# Patient Record
Sex: Male | Born: 1985
Health system: Southern US, Community
[De-identification: ages and names within clinical notes are randomized; demographics above are authoritative.]

## PROBLEM LIST (undated history)

## (undated) DIAGNOSIS — I10 Essential (primary) hypertension: Secondary | ICD-10-CM

## (undated) DIAGNOSIS — D649 Anemia, unspecified: Secondary | ICD-10-CM

## (undated) DIAGNOSIS — J45909 Unspecified asthma, uncomplicated: Secondary | ICD-10-CM

## (undated) DIAGNOSIS — F32A Depression, unspecified: Secondary | ICD-10-CM

## (undated) DIAGNOSIS — F909 Attention-deficit hyperactivity disorder, unspecified type: Secondary | ICD-10-CM

## (undated) DIAGNOSIS — F419 Anxiety disorder, unspecified: Secondary | ICD-10-CM

## (undated) DIAGNOSIS — N19 Unspecified kidney failure: Secondary | ICD-10-CM

---

## 2017-01-18 ENCOUNTER — Other Ambulatory Visit: Payer: Self-pay

## 2017-01-18 ENCOUNTER — Ambulatory Visit (HOSPITAL_COMMUNITY)
Admission: EM | Admit: 2017-01-18 | Discharge: 2017-01-18 | Disposition: A | Discharge status: Home / Self Care | Payer: Self-pay | Attending: Urgent Care | Admitting: Urgent Care

## 2017-01-18 ENCOUNTER — Encounter (HOSPITAL_COMMUNITY): Payer: Self-pay | Admitting: Emergency Medicine

## 2017-01-18 DIAGNOSIS — B9789 Other viral agents as the cause of diseases classified elsewhere: Secondary | ICD-10-CM

## 2017-01-18 DIAGNOSIS — R112 Nausea with vomiting, unspecified: Secondary | ICD-10-CM

## 2017-01-18 DIAGNOSIS — R030 Elevated blood-pressure reading, without diagnosis of hypertension: Secondary | ICD-10-CM

## 2017-01-18 DIAGNOSIS — I10 Essential (primary) hypertension: Secondary | ICD-10-CM

## 2017-01-18 DIAGNOSIS — J069 Acute upper respiratory infection, unspecified: Secondary | ICD-10-CM

## 2017-01-18 HISTORY — DX: Unspecified asthma, uncomplicated: J45.909

## 2017-01-18 MED ORDER — BENZONATATE 100 MG PO CAPS: 100.0000 mg | ORAL_CAPSULE | Freq: Three times a day (TID) | ORAL | 0 refills | Status: DC | PRN

## 2017-01-18 MED ORDER — AMLODIPINE BESYLATE 5 MG PO TABS: 5.0000 mg | ORAL_TABLET | Freq: Every day | ORAL | 0 refills | Status: DC

## 2017-01-18 NOTE — Discharge Instructions (Signed)
[facility] [address] [place], [place] [address] [phone] Schedule appointment with Rosario Adie, PA-C, to establish care.   For sore throat try using a honey-based tea. Use 3 teaspoons of honey with juice squeezed from half lemon. Place shaved pieces of ginger into 1/2-1 cup of water and warm over stove top. Then mix the ingredients and repeat every 4 hours as needed.

## 2017-01-18 NOTE — ED Triage Notes (Signed)
Pt states "I didn't cook my breakfast all the way, it was a frozen meal, and afterward my stomach hurt and at work I threw up twice. I've also had a cold for a couple weeks.". C/o coughing.

## 2017-01-18 NOTE — ED Provider Notes (Signed)
  MRN: [id number] DOB: Feb 24, 1985  Subjective:   Samuel Burns is a 31 y.o. male presenting for 2 week history of productive cough, runny nose, subjective fever, mild headaches. Cough elicited wheezing (but has improved). Has tried generic NyQuil with some relief. Symptoms have improved except for the cough. He is also concerned about eating a frozen meal that was not fully cooked, vomited twice today. Has not tried medications for this today. Denies belly pain, ROS as above. Denies smoking cigarettes. Regarding BP, has been told he has this before but has not sought treatment. Denies dizziness, heart racing, palpitations, confusion, hematuria, lower leg swelling.  Samuel Burns is not currently taking any medications and has No Known Allergies.  Samuel Burns  has a past medical history of Asthma. Denies past surgical history. Reports family history of HTN with his mother's side.   Objective:   Vitals: BP (!) 189/107   Pulse 91   Temp 99.2 F (37.3 C) (Oral)   Resp 18   SpO2 100%   Physical Exam  Constitutional: He is oriented to person, place, and time. He appears well-developed and well-nourished.  HENT:  TM's intact bilaterally, no effusions or erythema. Nasal turbinates pink and moist, nasal passages patent. No sinus tenderness. Oropharynx clear, mucous membranes moist.  Eyes: Right eye exhibits no discharge. Left eye exhibits no discharge.  Neck: Normal range of motion. Neck supple.  Cardiovascular: Normal rate, regular rhythm and intact distal pulses. Exam reveals no gallop and no friction rub.  No murmur heard. Pulmonary/Chest: No respiratory distress. He has no wheezes. He has no rales.  Abdominal: Soft. Bowel sounds are normal. He exhibits no distension and no mass. There is no tenderness. There is no guarding.  Musculoskeletal: He exhibits no edema.  Lymphadenopathy:    He has no cervical adenopathy.  Neurological: He is alert and oriented to person, place, and time.  Skin: Skin is warm  and dry.   Assessment and Plan :   Viral URI with cough  Essential hypertension  Elevated blood pressure reading  Nausea and vomiting, intractability of vomiting not specified, unspecified vomiting type  Will manage supportively for viral type illness. Return-to-clinic precautions discussed, patient verbalized understanding. Recommended dietary modifications. I started patient on BP medications. Will have him follow up with me at PCP.  Jaynee Eagles, PA-C [facility]  01/18/2017  6:55 PM    Jaynee Eagles, PA-C 01/18/17 1929

## 2017-03-22 ENCOUNTER — Encounter (HOSPITAL_COMMUNITY): Payer: Self-pay | Admitting: Emergency Medicine

## 2017-03-22 DIAGNOSIS — J45909 Unspecified asthma, uncomplicated: Secondary | ICD-10-CM | POA: Diagnosis not present

## 2017-03-22 DIAGNOSIS — J029 Acute pharyngitis, unspecified: Secondary | ICD-10-CM | POA: Insufficient documentation

## 2017-03-22 DIAGNOSIS — B9789 Other viral agents as the cause of diseases classified elsewhere: Secondary | ICD-10-CM | POA: Insufficient documentation

## 2017-03-22 MED ORDER — IBUPROFEN 400 MG PO TABS
400.0000 mg | ORAL_TABLET | Freq: Once | ORAL | Status: AC | PRN
  Administered 2017-03-22: 400 mg via ORAL
  Filled 2017-03-22: qty 1

## 2017-03-22 NOTE — ED Triage Notes (Signed)
Pt to ED c/o sore throat x 2-3 days, states his son was sick with same, but was negative for strep. He reports having had strep about 10 years ago and this feels worse. Tonsils red and enlarged, voice clear, talking without difficulty. Denies fevers/chills. BP 227/145 in triage - states he hasn't had a chance to take his BP medication this evening.

## 2017-03-23 ENCOUNTER — Emergency Department (HOSPITAL_COMMUNITY)
Admission: EM | Admit: 2017-03-23 | Discharge: 2017-03-23 | Disposition: A | Discharge status: Home / Self Care | Payer: 59 | Attending: Emergency Medicine | Admitting: Emergency Medicine

## 2017-03-23 DIAGNOSIS — J029 Acute pharyngitis, unspecified: Secondary | ICD-10-CM

## 2017-03-23 LAB — RAPID STREP SCREEN (MED CTR MEBANE ONLY): STREPTOCOCCUS, GROUP A SCREEN (DIRECT): NEGATIVE

## 2017-03-23 MED ORDER — AMLODIPINE BESYLATE 5 MG PO TABS
5.0000 mg | ORAL_TABLET | Freq: Once | ORAL | Status: AC
  Administered 2017-03-23: 5 mg via ORAL
  Filled 2017-03-23: qty 1

## 2017-03-23 MED ORDER — IBUPROFEN 400 MG PO TABS: 400.0000 mg | ORAL_TABLET | Freq: Four times a day (QID) | ORAL | 0 refills | Status: DC | PRN

## 2017-03-23 MED ORDER — CETIRIZINE-PSEUDOEPHEDRINE ER 5-120 MG PO TB12: 1.0000 | ORAL_TABLET | Freq: Every day | ORAL | 0 refills | Status: DC

## 2017-03-23 MED ORDER — DEXAMETHASONE 4 MG PO TABS
10.0000 mg | ORAL_TABLET | Freq: Once | ORAL | Status: AC
  Administered 2017-03-23: 10 mg via ORAL
  Filled 2017-03-23: qty 3

## 2017-03-23 NOTE — ED Provider Notes (Signed)
[facility] EMERGENCY DEPARTMENT Provider Note   CSN: [id number] Arrival date & time: 03/22/17  2336     History   Chief Complaint Chief Complaint  Patient presents with  . Sore Throat    HPI Samuel Burns is a 32 y.o. male with history of asthma who presents with a 2-3-day history of sore throat.  Patient reports he is getting over a cold over the past week and developed a sore throat that is worse with swallowing for the past few days.  He denies fever.  He reports he is still coughing a little bit.  He has been exposed to his significant other and son who both had similar symptoms.  They are both strep negative.  He has taken Excedrin at home for the pain.  He still has some remaining nasal congestion.  His ears hurt when he swallows.  He denies any chest pain, shortness of breath, vomiting, abdominal pain.  HPI  Past Medical History:  Diagnosis Date  . Asthma     There are no active problems to display for this patient.   History reviewed. No pertinent surgical history.     Home Medications    Prior to Admission medications   Medication Sig Start Date End Date Taking? Authorizing Provider  amLODipine (NORVASC) 5 MG tablet Take 1 tablet (5 mg total) by mouth daily. 01/18/17   Jaynee Eagles, PA-C  benzonatate (TESSALON) 100 MG capsule Take 1-2 capsules (100-200 mg total) by mouth 3 (three) times daily as needed for cough. 01/18/17   Jaynee Eagles, PA-C  cetirizine-pseudoephedrine (ZYRTEC-D) 5-120 MG tablet Take 1 tablet by mouth daily. 03/23/17   Myreon Wimer, Bea Graff, PA-C  ibuprofen (ADVIL,MOTRIN) 400 MG tablet Take 1 tablet (400 mg total) by mouth every 6 (six) hours as needed. 03/23/17   Frederica Kuster, PA-C    Family History Family History  Problem Relation Age of Onset  . Hypertension Maternal Grandmother     Social History Social History   Tobacco Use  . Smoking status: Never Smoker  Substance Use Topics  . Alcohol use: No    Frequency: Never  .  Drug use: No     Allergies   Patient has no known allergies.   Review of Systems Review of Systems  Constitutional: Negative for chills and fever.  HENT: Positive for congestion, ear pain (with swallowing) and sore throat. Negative for facial swelling.   Respiratory: Positive for cough. Negative for shortness of breath.   Cardiovascular: Negative for chest pain.  Gastrointestinal: Negative for abdominal pain, nausea and vomiting.  Genitourinary: Negative for dysuria.  Musculoskeletal: Negative for back pain.  Skin: Negative for rash and wound.  Neurological: Negative for headaches.  Psychiatric/Behavioral: The patient is not nervous/anxious.      Physical Exam Updated Vital Signs BP (!) 212/130   Pulse 95   Temp 99.1 F (37.3 C) (Oral)   Resp 18   Ht 5' 7.5" (1.715 m)   Wt 98.4 kg (217 lb)   SpO2 95%   BMI 33.49 kg/m   Physical Exam  Constitutional: He appears well-developed and well-nourished. No distress.  Tolerating secretions  HENT:  Head: Normocephalic and atraumatic.  Right Ear: Tympanic membrane normal.  Mouth/Throat: No trismus in the jaw. Posterior oropharyngeal edema and posterior oropharyngeal erythema present. No oropharyngeal exudate or tonsillar abscesses. Tonsils are 2+ on the right. Tonsils are 2+ on the left. No tonsillar exudate.  Left ear canal occluded with cerumen  Eyes: Conjunctivae are  normal. Pupils are equal, round, and reactive to light. Right eye exhibits no discharge. Left eye exhibits no discharge. No scleral icterus.  Neck: Normal range of motion. Neck supple. No thyromegaly present.  Cardiovascular: Normal rate, regular rhythm, normal heart sounds and intact distal pulses. Exam reveals no gallop and no friction rub.  No murmur heard. Pulmonary/Chest: Effort normal and breath sounds normal. No stridor. No respiratory distress. He has no wheezes. He has no rales.  Abdominal: Soft. Bowel sounds are normal. He exhibits no distension. There  is no tenderness. There is no rebound and no guarding.  Musculoskeletal: He exhibits no edema.  Lymphadenopathy:    He has no cervical adenopathy.  Neurological: He is alert. Coordination normal.  Skin: Skin is warm and dry. No rash noted. He is not diaphoretic. No pallor.  Psychiatric: He has a normal mood and affect.  Nursing note and vitals reviewed.    ED Treatments / Results  Labs (all labs ordered are listed, but only abnormal results are displayed) Labs Reviewed  RAPID STREP SCREEN (NOT AT [facility])  CULTURE, GROUP A STREP [facility])    EKG  EKG Interpretation None       Radiology No results found.  Procedures Procedures (including critical care time)  Medications Ordered in ED Medications  ibuprofen (ADVIL,MOTRIN) tablet 400 mg (400 mg Oral Given 03/22/17 2356)  dexamethasone (DECADRON) tablet 10 mg (10 mg Oral Given 03/23/17 6578)  amLODipine (NORVASC) tablet 5 mg (5 mg Oral Given 03/23/17 4696)     Initial Impression / Assessment and Plan / ED Course  I have reviewed the triage vital signs and the nursing notes.  Pertinent labs & imaging results that were available during my care of the patient were reviewed by me and considered in my medical decision making (see chart for details).     Pt with negative strep. Diagnosis of viral pharyngitis. No abx indicated at this time. Discussed that results of strep culture are pending and patient will be informed if positive result and abx will be called in at that time. Discharge with symptomatic tx. No evidence of dehydration. Pt is tolerating secretions. Presentation not concerning for peritonsillar abscess or spread of infection to deep spaces of the throat; patent airway.  Will treat with single dose Decadron in the ED.  Patient also given his nightly amlodipine prior to discharge that he did not have time to take because he came here last night.  Patient discharged home with ibuprofen and Zyrtec-D.  Specific return precautions  discussed. Recommended PCP follow up.  Patient understands and agrees with plan.  Patient discharged in satisfactory condition.  Final Clinical Impressions(s) / ED Diagnoses   Final diagnoses:  Viral pharyngitis    ED Discharge Orders        Ordered    ibuprofen (ADVIL,MOTRIN) 400 MG tablet  Every 6 hours PRN     03/23/17 0629    cetirizine-pseudoephedrine (ZYRTEC-D) 5-120 MG tablet  Daily     03/23/17 0629       Frederica Kuster, PA-C 29/52/84 1324    Delora Fuel, MD 40/10/27 2250

## 2017-03-23 NOTE — Discharge Instructions (Signed)
Medications: ibuprofen, Zyrtec-D  Treatment: Take ibuprofen every 4-6 hours as needed for your pain. You can alternate with Tylenol or continue Exedrin as prescribed over-the-counter. Take Zyrtec-D once daily for nasal and ear congestion.  Follow-up: Please return to the emergency department if you develop any new or worsening symptoms including persistent fever over 100.4, inability to open your mouth, full speech, asymmetry in the back of your throat, or any other new or concerning symptoms.

## 2017-03-25 LAB — CULTURE, GROUP A STREP (THRC)

## 2017-04-08 ENCOUNTER — Encounter: Payer: Self-pay | Admitting: Urgent Care

## 2017-04-08 ENCOUNTER — Ambulatory Visit (INDEPENDENT_AMBULATORY_CARE_PROVIDER_SITE_OTHER): Payer: 59 | Admitting: Urgent Care

## 2017-04-08 VITALS — BP 185/139 | HR 95 | Temp 98.0°F | Resp 18 | Ht 67.5 in | Wt 239.4 lb

## 2017-04-08 DIAGNOSIS — I10 Essential (primary) hypertension: Secondary | ICD-10-CM

## 2017-04-08 DIAGNOSIS — J029 Acute pharyngitis, unspecified: Secondary | ICD-10-CM

## 2017-04-08 DIAGNOSIS — H9201 Otalgia, right ear: Secondary | ICD-10-CM

## 2017-04-08 DIAGNOSIS — R591 Generalized enlarged lymph nodes: Secondary | ICD-10-CM | POA: Diagnosis not present

## 2017-04-08 DIAGNOSIS — E669 Obesity, unspecified: Secondary | ICD-10-CM

## 2017-04-08 DIAGNOSIS — R030 Elevated blood-pressure reading, without diagnosis of hypertension: Secondary | ICD-10-CM | POA: Diagnosis not present

## 2017-04-08 DIAGNOSIS — R0989 Other specified symptoms and signs involving the circulatory and respiratory systems: Secondary | ICD-10-CM

## 2017-04-08 MED ORDER — AMLODIPINE BESYLATE 10 MG PO TABS: 10.0000 mg | ORAL_TABLET | Freq: Every day | ORAL | 1 refills | Status: DC

## 2017-04-08 MED ORDER — LOSARTAN POTASSIUM 100 MG PO TABS: 100.0000 mg | ORAL_TABLET | Freq: Every day | ORAL | 3 refills | Status: DC

## 2017-04-08 NOTE — Patient Instructions (Addendum)
Start taking 1/2 tablet of losartan (50mg ). In 1 week we will see what your blood pressure is like and plan to increase to a full tablet.   Prepare all your meals at home, do not use any salt. Pay attention to nutritional labels for the amount of sodium/salt. Do not eat any fast food or restaurant food in general. For seasoning of your foods, Mrs. Deliah Boston.    Salads - kale, spinach, cabbage, spring mix; use seeds like pumpkin seeds or sunflower seeds; you can also use 1-2 hard boiled eggs. Fruits - avocadoes, berries (blueberries, raspberries, blackberries), apples, oranges, pomegranate, grapefruit Seeds - quinoa, chia seeds; you can also incorporate oatmeal Vegetables - aspargus, cauliflower, broccoli, green beans, brussel spouts, bell peppers; stay away from starchy vegetables like potatoes, carrots, peas  Brussel sprouts - Cut off stems. Place in a mixing bowl that has a lid. Pour in a 1/4-1/2 cup olive oil, spices, use a light amount of parmesan. Place on a baking sheet. Bake for 10 minutes at 400F. Take it out, eat the brussel chips. Place for another 5-10 minutes.   Mashed cauliflower - Boil a bunch of cauliflower in a pot of water. Blend in a food processor with 1-2 tablespoons of butter.  Spaghetti squash -  Cut the squash in half very carefully, clean out seeds from the middle. Place 1/2 face down in a microwave safe dish with at least 2 inches of water. Make 4-6 slits on outside of spaghetti squash and microwave for 10-12 minutes. Take out the spaghetti using a metal spoon. Repeat for the other half.   Vega protein is good protein powder, make sure you use ~6 ice cubes to give it smoothie consistency together with ~4-6 ounces of vanilla soy milk. Throw cinnamon into your shake, use peanut butter. You can also use the fruits that I listed above. Throw spinach or kale into the shake.      Hypertension Hypertension, commonly called high blood pressure, is when the force of blood pumping  through the arteries is too strong. The arteries are the blood vessels that carry blood from the heart throughout the body. Hypertension forces the heart to work harder to pump blood and may cause arteries to become narrow or stiff. Having untreated or uncontrolled hypertension can cause heart attacks, strokes, kidney disease, and other problems. A blood pressure reading consists of a higher number over a lower number. Ideally, your blood pressure should be below 120/80. The first ("top") number is called the systolic pressure. It is a measure of the pressure in your arteries as your heart beats. The second ("bottom") number is called the diastolic pressure. It is a measure of the pressure in your arteries as the heart relaxes. What are the causes? The cause of this condition is not known. What increases the risk? Some risk factors for high blood pressure are under your control. Others are not. Factors you can change  Smoking.  Having type 2 diabetes mellitus, high cholesterol, or both.  Not getting enough exercise or physical activity.  Being overweight.  Having too much fat, sugar, calories, or salt (sodium) in your diet.  Drinking too much alcohol. Factors that are difficult or impossible to change  Having chronic kidney disease.  Having a family history of high blood pressure.  Age. Risk increases with age.  Race. You may be at higher risk if you are African-American.  Gender. Men are at higher risk than women before age 20. After age 72, women are  at higher risk than men.  Having obstructive sleep apnea.  Stress. What are the signs or symptoms? Extremely high blood pressure (hypertensive crisis) may cause:  Headache.  Anxiety.  Shortness of breath.  Nosebleed.  Nausea and vomiting.  Severe chest pain.  Jerky movements you cannot control (seizures).  How is this diagnosed? This condition is diagnosed by measuring your blood pressure while you are seated, with  your arm resting on a surface. The cuff of the blood pressure monitor will be placed directly against the skin of your upper arm at the level of your heart. It should be measured at least twice using the same arm. Certain conditions can cause a difference in blood pressure between your right and left arms. Certain factors can cause blood pressure readings to be lower or higher than normal (elevated) for a short period of time:  When your blood pressure is higher when you are in a health care provider's office than when you are at home, this is called white coat hypertension. Most people with this condition do not need medicines.  When your blood pressure is higher at home than when you are in a health care provider's office, this is called masked hypertension. Most people with this condition may need medicines to control blood pressure.  If you have a high blood pressure reading during one visit or you have normal blood pressure with other risk factors:  You may be asked to return on a different day to have your blood pressure checked again.  You may be asked to monitor your blood pressure at home for 1 week or longer.  If you are diagnosed with hypertension, you may have other blood or imaging tests to help your health care provider understand your overall risk for other conditions. How is this treated? This condition is treated by making healthy lifestyle changes, such as eating healthy foods, exercising more, and reducing your alcohol intake. Your health care provider may prescribe medicine if lifestyle changes are not enough to get your blood pressure under control, and if:  Your systolic blood pressure is above 130.  Your diastolic blood pressure is above 80.  Your personal target blood pressure may vary depending on your medical conditions, your age, and other factors. Follow these instructions at home: Eating and drinking  Eat a diet that is high in fiber and potassium, and low in  sodium, added sugar, and fat. An example eating plan is called the DASH (Dietary Approaches to Stop Hypertension) diet. To eat this way: ? Eat plenty of fresh fruits and vegetables. Try to fill half of your plate at each meal with fruits and vegetables. ? Eat whole grains, such as whole wheat pasta, brown rice, or whole grain bread. Fill about one quarter of your plate with whole grains. ? Eat or drink low-fat dairy products, such as skim milk or low-fat yogurt. ? Avoid fatty cuts of meat, processed or cured meats, and poultry with skin. Fill about one quarter of your plate with lean proteins, such as fish, chicken without skin, beans, eggs, and tofu. ? Avoid premade and processed foods. These tend to be higher in sodium, added sugar, and fat.  Reduce your daily sodium intake. Most people with hypertension should eat less than 1,500 mg of sodium a day.  Limit alcohol intake to no more than 1 drink a day for nonpregnant women and 2 drinks a day for men. One drink equals 12 oz of beer, 5 oz of wine, or 1  oz of hard liquor. Lifestyle  Work with your health care provider to maintain a healthy body weight or to lose weight. Ask what an ideal weight is for you.  Get at least 30 minutes of exercise that causes your heart to beat faster (aerobic exercise) most days of the week. Activities may include walking, swimming, or biking.  Include exercise to strengthen your muscles (resistance exercise), such as pilates or lifting weights, as part of your weekly exercise routine. Try to do these types of exercises for 30 minutes at least 3 days a week.  Do not use any products that contain nicotine or tobacco, such as cigarettes and e-cigarettes. If you need help quitting, ask your health care provider.  Monitor your blood pressure at home as told by your health care provider.  Keep all follow-up visits as told by your health care provider. This is important. Medicines  Take over-the-counter and  prescription medicines only as told by your health care provider. Follow directions carefully. Blood pressure medicines must be taken as prescribed.  Do not skip doses of blood pressure medicine. Doing this puts you at risk for problems and can make the medicine less effective.  Ask your health care provider about side effects or reactions to medicines that you should watch for. Contact a health care provider if:  You think you are having a reaction to a medicine you are taking.  You have headaches that keep coming back (recurring).  You feel dizzy.  You have swelling in your ankles.  You have trouble with your vision. Get help right away if:  You develop a severe headache or confusion.  You have unusual weakness or numbness.  You feel faint.  You have severe pain in your chest or abdomen.  You vomit repeatedly.  You have trouble breathing. Summary  Hypertension is when the force of blood pumping through your arteries is too strong. If this condition is not controlled, it may put you at risk for serious complications.  Your personal target blood pressure may vary depending on your medical conditions, your age, and other factors. For most people, a normal blood pressure is less than 120/80.  Hypertension is treated with lifestyle changes, medicines, or a combination of both. Lifestyle changes include weight loss, eating a healthy, low-sodium diet, exercising more, and limiting alcohol. This information is not intended to replace advice given to you by your health care provider. Make sure you discuss any questions you have with your health care provider. Document Released: 02/02/2005 Document Revised: 01/01/2016 Document Reviewed: 01/01/2016 Elsevier Interactive Patient Education  2018 [place], Adult An earache, or ear pain, can be caused by many things, including:  An infection.  Ear wax buildup.  Ear pressure.  Something in the ear that should  not be there (foreign body).  A sore throat.  Tooth problems.  Jaw problems.  Treatment of the earache will depend on the cause. If the cause is not clear or cannot be determined, you may need to watch your symptoms until your earache goes away or until a cause is found. Follow these instructions at home: Pay attention to any changes in your symptoms. Take these actions to help with your pain:  Take or apply over-the-counter and prescription medicines only as told by your health care provider.  If you were prescribed an antibiotic medicine, use it as told by your health care provider. Do not stop using the antibiotic even if you start to feel  better.  Do not put anything in your ear other than medicine that is prescribed by your health care provider.  If directed, apply heat to the affected area as often as told by your health care provider. Use the heat source that your health care provider recommends, such as a moist heat pack or a heating pad. ? Place a towel between your skin and the heat source. ? Leave the heat on for 20-30 minutes. ? Remove the heat if your skin turns bright red. This is especially important if you are unable to feel pain, heat, or cold. You may have a greater risk of getting burned.  If directed, put ice on the ear: ? Put ice in a plastic bag. ? Place a towel between your skin and the bag. ? Leave the ice on for 20 minutes, 2-3 times a day.  Try resting in an upright position instead of lying down. This may help to reduce pressure in your ear and relieve pain.  Chew gum if it helps to relieve your ear pain.  Treat any allergies as told by your health care provider.  Keep all follow-up visits as told by your health care provider. This is important.  Contact a health care provider if:  Your pain does not improve within 2 days.  Your earache gets worse.  You have new symptoms.  You have a fever. Get help right away if:  You have a severe  headache.  You have a stiff neck.  You have trouble swallowing.  You have redness or swelling behind your ear.  You have fluid or blood coming from your ear.  You have hearing loss.  You feel dizzy. This information is not intended to replace advice given to you by your health care provider. Make sure you discuss any questions you have with your health care provider. Document Released: 09/20/2003 Document Revised: 10/01/2015 Document Reviewed: 07/29/2015 Elsevier Interactive Patient Education  2018 [employer].     IF you received an x-ray today, you will receive an invoice from [facility]. Please contact [facility] at [phone] with questions or concerns regarding your invoice.   IF you received labwork today, you will receive an invoice from [facility]. Please contact [facility] at [phone] with questions or concerns regarding your invoice.   Our billing staff will not be able to assist you with questions regarding bills from these companies.  You will be contacted with the lab results as soon as they are available. The fastest way to get your results is to activate your My Chart account. Instructions are located on the last page of this paperwork. If you have not heard from [place] regarding the results in 2 weeks, please contact this office.

## 2017-04-08 NOTE — Progress Notes (Signed)
  MRN: [id number] DOB: 17-Dec-1985  Subjective:   Samuel Burns is a 32 y.o. male presenting for 2 week history of right ear pain. Symptoms started out with sore throat, sinus congestion. Patient was seen at [facility] ER on 03/23/2017, treated with Decadron, given scripts for ibuprofen, Zyrtec-D. States that he stills has symptoms. Regarding BP, he is taking amlodipine. Diet is non-compliant. Denies dizziness, chronic headache, blurred vision, chest pain, shortness of breath, heart racing, palpitations, nausea, vomiting, abdominal pain, hematuria, lower leg swelling. Denies smoking cigarettes.  Samuel Burns has a current medication list which includes the following prescription(s): amlodipine and cetirizine-pseudoephedrine. Also has No Known Allergies.  Samuel Burns  has a past medical history of Asthma. Denies past surgical history. His grandmother, father have high blood pressure.   Objective:   Vitals: BP (!) 185/139   Pulse 95   Temp 98 F (36.7 C) (Oral)   Resp 18   Ht 5' 7.5" (1.715 m)   Wt 239 lb 6.4 oz (108.6 kg)   SpO2 99%   BMI 36.94 kg/m   BP Readings from Last 3 Encounters:  04/08/17 (!) 185/139  03/23/17 (!) 212/130  01/18/17 (!) 189/107    Physical Exam  Constitutional: He is oriented to person, place, and time. He appears well-developed and well-nourished.  HENT:  TM's intact bilaterally, no effusions or erythema. Nasal turbinates dry, nasal passages patent. No sinus tenderness. Oropharynx clear, mucous membranes moist.    Eyes: EOM are normal. Pupils are equal, round, and reactive to light. Right eye exhibits no discharge.  Neck: Normal range of motion. Neck supple. No thyromegaly present.  Cardiovascular: Normal rate, regular rhythm and intact distal pulses. Exam reveals no gallop and no friction rub.  No murmur heard. Pulmonary/Chest: No respiratory distress. He has no wheezes. He has no rales.  Musculoskeletal: He exhibits no edema.  Lymphadenopathy:    He has no cervical  adenopathy.  Neurological: He is alert and oriented to person, place, and time. No cranial nerve deficit.  Skin: Skin is warm and dry.  Psychiatric: He has a normal mood and affect.   Assessment and Plan :   Right ear pain  Sore throat  Tenderness of lymph node  Essential hypertension - Plan: Comprehensive metabolic panel, Microalbumin / creatinine urine ratio  Elevated blood pressure reading - Plan: Comprehensive metabolic panel, Microalbumin / creatinine urine ratio  Obesity without serious comorbidity, unspecified classification, unspecified obesity type - Plan: Lipid panel  Patient's physical exam findings reassuring. BP is worrisome, kidney function unknown. Will start aggressive BP management, will follow up with labs and decide further treatment for his current symptoms. Return-to-clinic precautions discussed, patient verbalized understanding.   Jaynee Eagles, PA-C [facility] [phone] 04/08/2017  12:38 PM

## 2017-04-09 LAB — LIPID PANEL
Chol/HDL Ratio: 4 ratio (ref 0.0–5.0)
Cholesterol, Total: 157 mg/dL (ref 100–199)
HDL: 39 mg/dL — AB (ref >39)
LDL CALC: 95 mg/dL (ref 0–99)
Triglycerides: 114 mg/dL (ref 0–149)
VLDL CHOLESTEROL CAL: 23 mg/dL (ref 5–40)

## 2017-04-09 LAB — COMPREHENSIVE METABOLIC PANEL
A/G RATIO: 1.6 (ref 1.2–2.2)
ALBUMIN: 5.1 g/dL (ref 3.5–5.5)
ALT: 35 IU/L (ref 0–44)
AST: 25 IU/L (ref 0–40)
Alkaline Phosphatase: 122 IU/L — ABNORMAL HIGH (ref 39–117)
BILIRUBIN TOTAL: 1 mg/dL (ref 0.0–1.2)
BUN / CREAT RATIO: 15 (ref 9–20)
BUN: 17 mg/dL (ref 6–20)
CALCIUM: 10 mg/dL (ref 8.7–10.2)
CHLORIDE: 103 mmol/L (ref 96–106)
CO2: 24 mmol/L (ref 20–29)
Creatinine, Ser: 1.1 mg/dL (ref 0.76–1.27)
GFR, EST AFRICAN AMERICAN: 102 mL/min/{1.73_m2} (ref >59)
GFR, EST NON AFRICAN AMERICAN: 88 mL/min/{1.73_m2} (ref >59)
GLOBULIN, TOTAL: 3.2 g/dL (ref 1.5–4.5)
Glucose: 84 mg/dL (ref 65–99)
POTASSIUM: 4.2 mmol/L (ref 3.5–5.2)
Sodium: 142 mmol/L (ref 134–144)
TOTAL PROTEIN: 8.3 g/dL (ref 6.0–8.5)

## 2017-04-09 LAB — MICROALBUMIN / CREATININE URINE RATIO
Creatinine, Urine: 231 mg/dL
MICROALB/CREAT RATIO: 52.5 mg/g{creat} — AB (ref 0.0–30.0)
Microalbumin, Urine: 121.3 ug/mL

## 2017-04-15 ENCOUNTER — Other Ambulatory Visit: Payer: Self-pay | Admitting: Urgent Care

## 2017-04-15 MED ORDER — ATORVASTATIN CALCIUM 10 MG PO TABS: 10.0000 mg | ORAL_TABLET | Freq: Every day | ORAL | 3 refills | Status: DC

## 2017-04-16 ENCOUNTER — Ambulatory Visit: Payer: 59 | Admitting: Urgent Care

## 2019-11-19 ENCOUNTER — Encounter (HOSPITAL_COMMUNITY): Payer: Self-pay | Admitting: Emergency Medicine

## 2019-11-19 ENCOUNTER — Emergency Department (HOSPITAL_COMMUNITY): Payer: Self-pay

## 2019-11-19 ENCOUNTER — Inpatient Hospital Stay (HOSPITAL_COMMUNITY): Payer: Self-pay

## 2019-11-19 ENCOUNTER — Other Ambulatory Visit: Payer: Self-pay

## 2019-11-19 ENCOUNTER — Inpatient Hospital Stay (HOSPITAL_COMMUNITY)
Admission: EM | Admit: 2019-11-19 | Discharge: 2019-11-27 | DRG: 252 | Disposition: A | Discharge status: Home / Self Care | Payer: Self-pay | Attending: Family Medicine | Admitting: Family Medicine

## 2019-11-19 DIAGNOSIS — D649 Anemia, unspecified: Secondary | ICD-10-CM

## 2019-11-19 DIAGNOSIS — K810 Acute cholecystitis: Secondary | ICD-10-CM | POA: Diagnosis present

## 2019-11-19 DIAGNOSIS — R7989 Other specified abnormal findings of blood chemistry: Secondary | ICD-10-CM | POA: Diagnosis present

## 2019-11-19 DIAGNOSIS — Z20822 Contact with and (suspected) exposure to covid-19: Secondary | ICD-10-CM | POA: Diagnosis present

## 2019-11-19 DIAGNOSIS — D594 Other nonautoimmune hemolytic anemias: Secondary | ICD-10-CM | POA: Diagnosis present

## 2019-11-19 DIAGNOSIS — D696 Thrombocytopenia, unspecified: Secondary | ICD-10-CM

## 2019-11-19 DIAGNOSIS — N186 End stage renal disease: Secondary | ICD-10-CM | POA: Diagnosis present

## 2019-11-19 DIAGNOSIS — I2489 Other forms of acute ischemic heart disease: Secondary | ICD-10-CM | POA: Diagnosis present

## 2019-11-19 DIAGNOSIS — N179 Acute kidney failure, unspecified: Secondary | ICD-10-CM | POA: Diagnosis present

## 2019-11-19 DIAGNOSIS — Z23 Encounter for immunization: Secondary | ICD-10-CM

## 2019-11-19 DIAGNOSIS — M311 Thrombotic microangiopathy, unspecified: Secondary | ICD-10-CM

## 2019-11-19 DIAGNOSIS — Z79899 Other long term (current) drug therapy: Secondary | ICD-10-CM

## 2019-11-19 DIAGNOSIS — Z992 Dependence on renal dialysis: Secondary | ICD-10-CM

## 2019-11-19 DIAGNOSIS — I471 Supraventricular tachycardia: Secondary | ICD-10-CM | POA: Diagnosis not present

## 2019-11-19 DIAGNOSIS — Z8249 Family history of ischemic heart disease and other diseases of the circulatory system: Secondary | ICD-10-CM

## 2019-11-19 DIAGNOSIS — I5032 Chronic diastolic (congestive) heart failure: Secondary | ICD-10-CM | POA: Diagnosis present

## 2019-11-19 DIAGNOSIS — I21A1 Myocardial infarction type 2: Secondary | ICD-10-CM | POA: Diagnosis not present

## 2019-11-19 DIAGNOSIS — I161 Hypertensive emergency: Principal | ICD-10-CM | POA: Diagnosis present

## 2019-11-19 DIAGNOSIS — K8020 Calculus of gallbladder without cholecystitis without obstruction: Secondary | ICD-10-CM

## 2019-11-19 DIAGNOSIS — I132 Hypertensive heart and chronic kidney disease with heart failure and with stage 5 chronic kidney disease, or end stage renal disease: Secondary | ICD-10-CM | POA: Diagnosis present

## 2019-11-19 DIAGNOSIS — I252 Old myocardial infarction: Secondary | ICD-10-CM

## 2019-11-19 DIAGNOSIS — E872 Acidosis, unspecified: Secondary | ICD-10-CM | POA: Diagnosis present

## 2019-11-19 DIAGNOSIS — R778 Other specified abnormalities of plasma proteins: Secondary | ICD-10-CM | POA: Diagnosis not present

## 2019-11-19 DIAGNOSIS — F1290 Cannabis use, unspecified, uncomplicated: Secondary | ICD-10-CM | POA: Diagnosis present

## 2019-11-19 DIAGNOSIS — R197 Diarrhea, unspecified: Secondary | ICD-10-CM | POA: Diagnosis present

## 2019-11-19 DIAGNOSIS — I248 Other forms of acute ischemic heart disease: Secondary | ICD-10-CM | POA: Diagnosis present

## 2019-11-19 DIAGNOSIS — R112 Nausea with vomiting, unspecified: Secondary | ICD-10-CM | POA: Diagnosis present

## 2019-11-19 DIAGNOSIS — D6959 Other secondary thrombocytopenia: Secondary | ICD-10-CM | POA: Diagnosis present

## 2019-11-19 DIAGNOSIS — Z9114 Patient's other noncompliance with medication regimen: Secondary | ICD-10-CM

## 2019-11-19 DIAGNOSIS — J45909 Unspecified asthma, uncomplicated: Secondary | ICD-10-CM | POA: Diagnosis present

## 2019-11-19 DIAGNOSIS — I313 Pericardial effusion (noninflammatory): Secondary | ICD-10-CM | POA: Diagnosis present

## 2019-11-19 DIAGNOSIS — M546 Pain in thoracic spine: Secondary | ICD-10-CM | POA: Diagnosis present

## 2019-11-19 DIAGNOSIS — N2581 Secondary hyperparathyroidism of renal origin: Secondary | ICD-10-CM | POA: Diagnosis present

## 2019-11-19 DIAGNOSIS — F4320 Adjustment disorder, unspecified: Secondary | ICD-10-CM | POA: Diagnosis present

## 2019-11-19 DIAGNOSIS — I4892 Unspecified atrial flutter: Secondary | ICD-10-CM | POA: Clinically undetermined

## 2019-11-19 DIAGNOSIS — D638 Anemia in other chronic diseases classified elsewhere: Secondary | ICD-10-CM

## 2019-11-19 DIAGNOSIS — D631 Anemia in chronic kidney disease: Secondary | ICD-10-CM | POA: Diagnosis present

## 2019-11-19 LAB — COMPREHENSIVE METABOLIC PANEL
ALT: 21 U/L (ref 0–44)
AST: 13 U/L — ABNORMAL LOW (ref 15–41)
Albumin: 4.5 g/dL (ref 3.5–5.0)
Alkaline Phosphatase: 232 U/L — ABNORMAL HIGH (ref 38–126)
Anion gap: 31 — ABNORMAL HIGH (ref 5–15)
BUN: 257 mg/dL — ABNORMAL HIGH (ref 6–20)
CO2: 15 mmol/L — ABNORMAL LOW (ref 22–32)
Calcium: 9.5 mg/dL (ref 8.9–10.3)
Chloride: 89 mmol/L — ABNORMAL LOW (ref 98–111)
Creatinine, Ser: 34.21 mg/dL — ABNORMAL HIGH (ref 0.61–1.24)
GFR calc Af Amer: 2 mL/min — ABNORMAL LOW (ref >60)
GFR calc non Af Amer: 1 mL/min — ABNORMAL LOW (ref >60)
Glucose, Bld: 131 mg/dL — ABNORMAL HIGH (ref 70–99)
Potassium: 3.7 mmol/L (ref 3.5–5.1)
Sodium: 135 mmol/L (ref 135–145)
Total Bilirubin: 2.3 mg/dL — ABNORMAL HIGH (ref 0.3–1.2)
Total Protein: 7.9 g/dL (ref 6.5–8.1)

## 2019-11-19 LAB — CBC
HCT: 21.8 % — ABNORMAL LOW (ref 39.0–52.0)
Hemoglobin: 7.3 g/dL — ABNORMAL LOW (ref 13.0–17.0)
MCH: 29.9 pg (ref 26.0–34.0)
MCHC: 33.5 g/dL (ref 30.0–36.0)
MCV: 89.3 fL (ref 80.0–100.0)
Platelets: 58 10*3/uL — ABNORMAL LOW (ref 150–400)
RBC: 2.44 MIL/uL — ABNORMAL LOW (ref 4.22–5.81)
RDW: 18.8 % — ABNORMAL HIGH (ref 11.5–15.5)
WBC: 13.7 10*3/uL — ABNORMAL HIGH (ref 4.0–10.5)
nRBC: 0.2 % (ref 0.0–0.2)

## 2019-11-19 LAB — LIPASE, BLOOD: Lipase: 169 U/L — ABNORMAL HIGH (ref 11–51)

## 2019-11-19 LAB — TROPONIN I (HIGH SENSITIVITY)
Troponin I (High Sensitivity): 592 ng/L (ref <18)
Troponin I (High Sensitivity): 579 ng/L (ref <18)
Troponin I (High Sensitivity): 591 ng/L (ref <18)

## 2019-11-19 LAB — BRAIN NATRIURETIC PEPTIDE: B Natriuretic Peptide: 1267 pg/mL — ABNORMAL HIGH (ref 0.0–100.0)

## 2019-11-19 LAB — HIV ANTIBODY (ROUTINE TESTING W REFLEX): HIV Screen 4th Generation wRfx: NONREACTIVE

## 2019-11-19 LAB — RESPIRATORY PANEL BY RT PCR (FLU A&B, COVID)
Influenza A by PCR: NEGATIVE
Influenza B by PCR: NEGATIVE
SARS Coronavirus 2 by RT PCR: NEGATIVE

## 2019-11-19 MED ORDER — FENTANYL CITRATE (PF) 100 MCG/2ML IJ SOLN
50.0000 ug | INTRAMUSCULAR | Status: DC | PRN
  Administered 2019-11-19: 50 ug via INTRAVENOUS
  Filled 2019-11-19: qty 2

## 2019-11-19 MED ORDER — AMLODIPINE BESYLATE 5 MG PO TABS: 5.0000 mg | ORAL_TABLET | Freq: Every day | ORAL | Status: DC

## 2019-11-19 MED ORDER — PIPERACILLIN-TAZOBACTAM IN DEX 2-0.25 GM/50ML IV SOLN
2.2500 g | Freq: Three times a day (TID) | INTRAVENOUS | Status: DC
  Filled 2019-11-19: qty 50

## 2019-11-19 MED ORDER — PIPERACILLIN-TAZOBACTAM IN DEX 2-0.25 GM/50ML IV SOLN
2.2500 g | Freq: Three times a day (TID) | INTRAVENOUS | Status: DC
  Administered 2019-11-20 – 2019-11-21 (×4): 2.25 g via INTRAVENOUS
  Filled 2019-11-19 (×5): qty 50

## 2019-11-19 MED ORDER — PIPERACILLIN-TAZOBACTAM 3.375 G IVPB 30 MIN: 3.3750 g | Freq: Once | INTRAVENOUS | Status: DC

## 2019-11-19 MED ORDER — ONDANSETRON HCL 4 MG/2ML IJ SOLN
4.0000 mg | Freq: Once | INTRAMUSCULAR | Status: AC
  Administered 2019-11-19: 13:00:00 4 mg via INTRAVENOUS
  Filled 2019-11-19: qty 2

## 2019-11-19 MED ORDER — POLYETHYLENE GLYCOL 3350 17 G PO PACK: 17.0000 g | PACK | Freq: Every day | ORAL | Status: DC | PRN

## 2019-11-19 MED ORDER — SODIUM CHLORIDE 0.9 % IV BOLUS
1000.0000 mL | Freq: Once | INTRAVENOUS | Status: AC
Start: 2019-11-19 — End: 2019-11-19
  Administered 2019-11-19: 13:00:00 1000 mL via INTRAVENOUS

## 2019-11-19 MED ORDER — HEPARIN SODIUM (PORCINE) 5000 UNIT/ML IJ SOLN
5000.0000 [IU] | Freq: Three times a day (TID) | INTRAMUSCULAR | Status: DC
  Administered 2019-11-19 – 2019-11-20 (×3): 5000 [IU] via SUBCUTANEOUS
  Filled 2019-11-19 (×3): qty 1

## 2019-11-19 MED ORDER — CARVEDILOL 3.125 MG PO TABS: 6.2500 mg | ORAL_TABLET | Freq: Two times a day (BID) | ORAL | Status: DC

## 2019-11-19 MED ORDER — AMLODIPINE BESYLATE 10 MG PO TABS
10.0000 mg | ORAL_TABLET | Freq: Every day | ORAL | Status: DC
  Administered 2019-11-19 – 2019-11-27 (×9): 10 mg via ORAL
  Filled 2019-11-19: qty 2
  Filled 2019-11-19: qty 1
  Filled 2019-11-19: qty 2
  Filled 2019-11-19 (×6): qty 1

## 2019-11-19 MED ORDER — CARVEDILOL 3.125 MG PO TABS
3.1250 mg | ORAL_TABLET | Freq: Two times a day (BID) | ORAL | Status: DC
  Administered 2019-11-19 – 2019-11-20 (×2): 3.125 mg via ORAL
  Filled 2019-11-19 (×2): qty 1

## 2019-11-19 MED ORDER — FENTANYL CITRATE (PF) 100 MCG/2ML IJ SOLN
50.0000 ug | Freq: Once | INTRAMUSCULAR | Status: AC
  Administered 2019-11-19: 50 ug via INTRAVENOUS
  Filled 2019-11-19: qty 2

## 2019-11-19 MED ORDER — AMLODIPINE BESYLATE 5 MG PO TABS: 10.0000 mg | ORAL_TABLET | Freq: Every day | ORAL | Status: DC

## 2019-11-19 MED ORDER — LABETALOL HCL 5 MG/ML IV SOLN: 10.0000 mg | INTRAVENOUS | Status: DC | PRN

## 2019-11-19 MED ORDER — ACETAMINOPHEN 325 MG PO TABS
650.0000 mg | ORAL_TABLET | Freq: Four times a day (QID) | ORAL | Status: DC | PRN
  Administered 2019-11-22: 650 mg via ORAL
  Filled 2019-11-19: qty 2

## 2019-11-19 MED ORDER — MELATONIN 5 MG PO TABS
5.0000 mg | ORAL_TABLET | Freq: Every day | ORAL | Status: DC
  Administered 2019-11-19 – 2019-11-26 (×8): 5 mg via ORAL
  Filled 2019-11-19 (×9): qty 1

## 2019-11-19 MED ORDER — PIPERACILLIN-TAZOBACTAM 3.375 G IVPB 30 MIN
3.3750 g | Freq: Once | INTRAVENOUS | Status: AC
  Administered 2019-11-20: 3.375 g via INTRAVENOUS
  Filled 2019-11-19: qty 50

## 2019-11-19 MED ORDER — NICARDIPINE HCL IN NACL 20-0.86 MG/200ML-% IV SOLN
3.0000 mg/h | INTRAVENOUS | Status: DC
  Administered 2019-11-19 (×2): 5 mg/h via INTRAVENOUS
  Filled 2019-11-19 (×2): qty 200

## 2019-11-19 MED ORDER — ACETAMINOPHEN 650 MG RE SUPP: 650.0000 mg | Freq: Four times a day (QID) | RECTAL | Status: DC | PRN

## 2019-11-19 NOTE — Progress Notes (Addendum)
FPTS Interim Progress Note  S:Interviewed patient at bedside.  He reports pain in his Right abdomen that while still painful is better controlled with pain medication. Is tearful because his father recently passed while on dialysis and now being told he will need to start dialysis. Discussed that tomorrow will start dialysis. Reports being unable to sleep except for a few hours at a time.  O: BP (!) 150/88   Pulse (!) 101   Temp 97.9 F (36.6 C) (Oral)   Resp (!) 23   SpO2 97%   Physical Exam Vitals and nursing note reviewed.  Constitutional:      General: He is not in acute distress.    Appearance: Normal appearance. He is normal weight. He is not ill-appearing, toxic-appearing or diaphoretic.  HENT:     Head: Normocephalic and atraumatic.  Cardiovascular:     Rate and Rhythm: Regular rhythm. Tachycardia present.     Pulses: Normal pulses.          Radial pulses are 2+ on the right side and 2+ on the left side.       Dorsalis pedis pulses are 2+ on the right side and 2+ on the left side.     Heart sounds: Normal heart sounds, S1 normal and S2 normal. No murmur heard.   Pulmonary:     Effort: Pulmonary effort is normal. No respiratory distress.     Breath sounds: Normal breath sounds. No wheezing.  Abdominal:     General: There is no distension.     Tenderness: There is abdominal tenderness in the right upper quadrant and right lower quadrant. There is no guarding.  Musculoskeletal:     Right lower leg: 2+ Pitting Edema present.     Left lower leg: 2+ Pitting Edema present.  Neurological:     Mental Status: He is alert. Mental status is at baseline.  Psychiatric:        Mood and Affect: Mood is anxious. Affect is tearful.        Behavior: Behavior normal.        Thought Content: Thought content normal.      A/P: Will continue with current plan of beginning Dialysis tomorrow.  Pain: Will start pain regiment to better control abdominal pain. -Continue fentanyl 50 mcg  q2 q6  Poor Sleep: Due to pain and anxiety of current situation -Continue fentanyl 50 mcg q2 PRN for pain control -Melatonin 5 mg QHS   Briant Cedar, MD 11/19/2019, 9:58 PM PGY-1, [place] pager [phone]     Addendum: After getting results of [place] suspect acute cholecystitis. Have consulted General Surgery who will see him this evening. Recommended to start Empiric Antibiotics. Consulted Pharmacy and will start Zosyn to be renally adjusted. Spoke with ED nurse will obtain blood cultures before antibiotics start and will stop the Nicardapine drip to attempt to wean.

## 2019-11-19 NOTE — ED Notes (Signed)
Pa Humes notified of goal met with last bp

## 2019-11-19 NOTE — ED Notes (Addendum)
Claiborne Billings - PA present during BP and aware of results.

## 2019-11-19 NOTE — TOC Initial Note (Signed)
Transition of Care [facility]) - Initial/Assessment Note    Patient Details  Name: Samuel Burns MRN: [id number] Date of Birth: Nov 04, 1985  Transition of [facility]) CM/SW Contact:    Verdell Carmine, RN Phone Number: 11/19/2019, 4:31 PM  Clinical Narrative:                 Consult in for patient CM patient does not have a PCP. Referral in patient instructions for [facility] to make appointment on Monday. Unable to make appointment for patient because it is the weekend.   Expected Discharge Plan: Home/Self Care Barriers to Discharge: Continued Medical Work up   Patient Goals and CMS Choice        Expected Discharge Plan and Services Expected Discharge Plan: Home/Self Care   Discharge Planning Services: CM Consult   Living arrangements for the past 2 months: Apartment                                      Prior Living Arrangements/Services Living arrangements for the past 2 months: Apartment   Patient language and need for interpreter reviewed:: Yes        Need for Family Participation in Patient Care: Yes (Comment) Care giver support system in place?: Yes (comment)   Criminal Activity/Legal Involvement Pertinent to Current Situation/Hospitalization: No - Comment as needed  Activities of Daily Living      Permission Sought/Granted                  Emotional Assessment       Orientation: : Oriented to Self, Oriented to Place, Oriented to  Time, Oriented to Situation Alcohol / Substance Use: Not Applicable Psych Involvement: No (comment)  Admission diagnosis:  Acute renal failure (ARF) ([place]) [N17.9] Patient Active Problem List   Diagnosis Date Noted  . Acute renal failure (ARF) ([place]) 11/19/2019   PCP:  Patient, No Pcp Per Pharmacy:   [facility] [address] [place] [address] Phone: [phone] Fax:  [phone]     Social Determinants of Health (SDOH) Interventions    Readmission Risk Interventions No flowsheet data found.

## 2019-11-19 NOTE — Progress Notes (Signed)
Pharmacy Antibiotic Note  Samuel Burns is a 34 y.o. male admitted on 11/19/2019 with acalculus cholecystitis.  Pharmacy has been consulted for Zosyn dosing.  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  Plan: Zosyn 3.375g x1 then 2.25g IV Q8H.   Temp (24hrs), Avg:97.9 F (36.6 C), Min:97.8 F (36.6 C), Max:97.9 F (36.6 C)  Recent Labs  Lab 11/19/19 0906  WBC 13.7*  CREATININE 34.21*     No Known Allergies   Thank you for allowing pharmacy to be a part of this patient's care.  Wynona Neat, PharmD, BCPS  11/19/2019 11:57 PM

## 2019-11-19 NOTE — Consult Note (Signed)
Reason for Consult: Elevated creatinine, metabolic acidosis Referring Physician: Carmin Muskrat M.D. (EDP)  HPI:  34 year old African-American man with past medical history significant for bronchial asthma and hypertension.  No documented history of chronic kidney disease and 2 years ago had a normal renal function with a creatinine of 1.1.  He reports to have not taken any of his antihypertensive medications reliably for the last 5 years or so and for the past few weeks has been having decreasing appetite, energy levels, generalized weakness and dysgeusia.  He denies any dysuria, urgency, frequency, flank pain, fever or chills.  He reports that his father had end-stage renal disease and was on hemodialysis prior to his demise.  He complains of some intermittent mid to lower back pain for the last few weeks without any notable numbness or weakness of the lower extremities but has had some unintentional tremors particularly with his hands.  He denies any regular NSAID use.  Labs in the emergency room today were significant for a creatinine of 34 and a bicarbonate of 15 with a hemoglobin of 7.3.  Troponin is elevated as is BNP.  Albumin 4.5.  Past Medical History:  Diagnosis Date  . Asthma     History reviewed. No pertinent surgical history.  Family History  Problem Relation Age of Onset  . Hypertension Maternal Grandmother     Social History:  reports that he has never smoked. He has never used smokeless tobacco. He reports current drug use. Drug: Marijuana. He reports that he does not drink alcohol.  Allergies: No Known Allergies  Medications:  Scheduled: . fentaNYL (SUBLIMAZE) injection  50 mcg Intravenous Once    BMP Latest Ref Rng & Units 11/19/2019 04/08/2017  Glucose 70 - 99 mg/dL 131(H) 84  BUN 6 - 20 mg/dL 257(H) 17  Creatinine 0.61 - 1.24 mg/dL 34.21(H) 1.10  BUN/Creat Ratio 9 - 20 - 15  Sodium 135 - 145 mmol/L 135 142  Potassium 3.5 - 5.1 mmol/L 3.7 4.2  Chloride 98 -  111 mmol/L 89(L) 103  CO2 22 - 32 mmol/L 15(L) 24  Calcium 8.9 - 10.3 mg/dL 9.5 10.0   CBC Latest Ref Rng & Units 11/19/2019  WBC 4.0 - 10.5 K/uL 13.7(H)  Hemoglobin 13.0 - 17.0 g/dL 7.3(L)  Hematocrit 39 - 52 % 21.8(L)  Platelets 150 - 400 K/uL 58(L)     CT ABDOMEN PELVIS WO CONTRAST  Result Date: 11/19/2019 CLINICAL DATA:  Right-sided thoracic back pain, nausea, vomiting, diarrhea, aortic dissection suspected EXAM: CT CHEST, ABDOMEN AND PELVIS WITHOUT CONTRAST TECHNIQUE: Multidetector CT imaging of the chest, abdomen and pelvis was performed following the standard protocol without IV contrast. COMPARISON:  None. FINDINGS: CT CHEST FINDINGS Cardiovascular: No significant vascular findings. Cardiomegaly. No pericardial effusion. Mediastinum/Nodes: No enlarged mediastinal, hilar, or axillary lymph nodes. Thyroid gland, trachea, and esophagus demonstrate no significant findings. Lungs/Pleura: Dependent bibasilar heterogeneous atelectasis or consolidation. No pleural effusion or pneumothorax. Musculoskeletal: No chest wall mass or suspicious bone lesions identified. CT ABDOMEN PELVIS FINDINGS Hepatobiliary: No solid liver abnormality is seen. The gallbladder is distended with wall thickening (series 3, image 77). No radiopaque calculi identified. No biliary ductal dilatation Pancreas: Unremarkable. No pancreatic ductal dilatation or surrounding inflammatory changes. Spleen: Normal in size without significant abnormality. Adrenals/Urinary Tract: Adrenal glands are unremarkable. Kidneys are normal, without renal calculi, solid lesion, or hydronephrosis. Bladder is unremarkable. Stomach/Bowel: Stomach is within normal limits. Appendix appears normal. No evidence of bowel wall thickening, distention, or inflammatory changes. Vascular/Lymphatic: No significant vascular  findings are present. No enlarged abdominal or pelvic lymph nodes. Reproductive: No mass or other abnormality. Other: No abdominal wall hernia  or abnormality. No abdominopelvic ascites. Musculoskeletal: No acute or significant osseous findings. IMPRESSION: 1. The gallbladder is distended with wall thickening. No radiopaque calculi identified. No biliary ductal dilatation. Findings are concerning for acute cholecystitis. 2. Predominantly dependent bibasilar heterogeneous atelectasis or consolidation. Findings are concerning for infection or aspiration. 3. No obvious evidence of aortic aneurysm or dissection, however please note that noncontrast CT is very limited for the evaluation of aortic dissection and other acute aortic pathology. 4. Cardiomegaly. Electronically Signed   By: Eddie Candle M.D.   On: 11/19/2019 14:38   DG Chest 2 View  Result Date: 11/19/2019 CLINICAL DATA:  Right back pain EXAM: CHEST - 2 VIEW COMPARISON:  None. FINDINGS: Lungs are clear.  No pleural effusion or pneumothorax. Cardiomegaly. Visualized osseous structures are within normal limits. IMPRESSION: No evidence of acute cardiopulmonary disease. Electronically Signed   By: Julian Hy M.D.   On: 11/19/2019 13:19   CT CHEST WO CONTRAST  Result Date: 11/19/2019 CLINICAL DATA:  Right-sided thoracic back pain, nausea, vomiting, diarrhea, aortic dissection suspected EXAM: CT CHEST, ABDOMEN AND PELVIS WITHOUT CONTRAST TECHNIQUE: Multidetector CT imaging of the chest, abdomen and pelvis was performed following the standard protocol without IV contrast. COMPARISON:  None. FINDINGS: CT CHEST FINDINGS Cardiovascular: No significant vascular findings. Cardiomegaly. No pericardial effusion. Mediastinum/Nodes: No enlarged mediastinal, hilar, or axillary lymph nodes. Thyroid gland, trachea, and esophagus demonstrate no significant findings. Lungs/Pleura: Dependent bibasilar heterogeneous atelectasis or consolidation. No pleural effusion or pneumothorax. Musculoskeletal: No chest wall mass or suspicious bone lesions identified. CT ABDOMEN PELVIS FINDINGS Hepatobiliary: No solid  liver abnormality is seen. The gallbladder is distended with wall thickening (series 3, image 77). No radiopaque calculi identified. No biliary ductal dilatation Pancreas: Unremarkable. No pancreatic ductal dilatation or surrounding inflammatory changes. Spleen: Normal in size without significant abnormality. Adrenals/Urinary Tract: Adrenal glands are unremarkable. Kidneys are normal, without renal calculi, solid lesion, or hydronephrosis. Bladder is unremarkable. Stomach/Bowel: Stomach is within normal limits. Appendix appears normal. No evidence of bowel wall thickening, distention, or inflammatory changes. Vascular/Lymphatic: No significant vascular findings are present. No enlarged abdominal or pelvic lymph nodes. Reproductive: No mass or other abnormality. Other: No abdominal wall hernia or abnormality. No abdominopelvic ascites. Musculoskeletal: No acute or significant osseous findings. IMPRESSION: 1. The gallbladder is distended with wall thickening. No radiopaque calculi identified. No biliary ductal dilatation. Findings are concerning for acute cholecystitis. 2. Predominantly dependent bibasilar heterogeneous atelectasis or consolidation. Findings are concerning for infection or aspiration. 3. No obvious evidence of aortic aneurysm or dissection, however please note that noncontrast CT is very limited for the evaluation of aortic dissection and other acute aortic pathology. 4. Cardiomegaly. Electronically Signed   By: Eddie Candle M.D.   On: 11/19/2019 14:38   US Aorta  Result Date: 11/19/2019 CLINICAL DATA:  34 year old male with hypertensive emergency and back pain for 3 days. EXAM: ULTRASOUND OF ABDOMINAL AORTA TECHNIQUE: Ultrasound examination of the abdominal aorta and proximal common iliac arteries was performed to evaluate for aneurysm. Additional color and Doppler images of the distal aorta were obtained to document patency. COMPARISON:  None. FINDINGS: Abdominal aortic measurements as follows:  Proximal:  2.4 x 2.6 cm (AP by transverse) Mid:         2.0 x 1.9 cm Distal:      1.8 x 2.0 cm Patent: Yes, peak systolic velocity is 324  cm/s Aortoiliac bifurcation and proximal iliac arteries are obscured by overlying bowel gas. IMPRESSION: Negative for abdominal aortic aneurysm. Electronically Signed   By: Genevie Ann M.D.   On: 11/19/2019 13:29    Review of Systems  Constitutional: Positive for activity change, appetite change and fatigue. Negative for chills and fever.  HENT: Negative for nosebleeds, sinus pressure, sinus pain and sore throat.   Eyes: Negative for photophobia and redness.  Respiratory: Negative for cough, shortness of breath and wheezing.   Gastrointestinal: Positive for nausea and vomiting. Negative for abdominal pain and diarrhea.  Genitourinary: Positive for difficulty urinating. Negative for dysuria, frequency, hematuria and urgency.  Musculoskeletal: Positive for back pain. Negative for joint swelling, myalgias and neck stiffness.  Neurological: Positive for tremors, light-headedness and headaches. Negative for numbness.  Psychiatric/Behavioral: The patient is nervous/anxious.    Blood pressure (!) 167/90, pulse 99, temperature 97.9 F (36.6 C), temperature source Oral, resp. rate (!) 25, SpO2 100 %. Physical Exam Vitals and nursing note reviewed.  Constitutional:      Appearance: Normal appearance. He is normal weight. He is ill-appearing.  HENT:     Head: Normocephalic and atraumatic.     Right Ear: External ear normal.     Left Ear: External ear normal.     Nose: Nose normal.     Mouth/Throat:     Mouth: Mucous membranes are dry.     Pharynx: Oropharynx is clear. No oropharyngeal exudate.  Eyes:     General: No scleral icterus.    Conjunctiva/sclera: Conjunctivae normal.     Pupils: Pupils are equal, round, and reactive to light.  Cardiovascular:     Rate and Rhythm: Normal rate and regular rhythm.     Heart sounds: Murmur heard.      Comments: 3/6  holosystolic murmur Pulmonary:     Effort: Pulmonary effort is normal.     Breath sounds: Normal breath sounds. No wheezing.  Abdominal:     General: Abdomen is flat. There is no distension.     Palpations: Abdomen is soft. There is no mass.     Tenderness: There is no abdominal tenderness. There is no guarding.  Musculoskeletal:     Cervical back: Normal range of motion and neck supple. No rigidity.     Right lower leg: Edema present.     Left lower leg: Edema present.     Comments: Trace LE edema  Skin:    General: Skin is warm and dry.     Findings: No bruising.  Neurological:     General: No focal deficit present.     Mental Status: He is alert and oriented to person, place, and time.  Psychiatric:        Mood and Affect: Mood normal.     Assessment/Plan: 1.  Acute kidney injury versus end-stage renal disease: The available data points likely to end-stage renal disease.  We will schedule him for placement of a tunneled hemodialysis catheter tomorrow by interventional radiology and at some point during this hospitalization, obtain vascular surgery consultation for permanent access along with outpatient dialysis unit placement.  We had a lengthy discussion regarding his current findings and symptoms that likely reflect ESRD over acute kidney injury. 2.  Hypertensive urgency: He is currently on nicardipine drip and I recommend resuming oral antihypertensive therapy at this time as he is able to eat and drink without any problems. 3.  Anemia: Likely anemia of chronic kidney disease, check iron saturations and decide on ESA  with subsequent labs. 4.  Secondary hyperparathyroidism: Screen for secondary hyperparathyroidism with a phosphorus level as well as PTH.  His calcium level is within acceptable range. 5.  Elevated troponin levels: Unclear if this is entirely due to his advanced chronic kidney disease or whether this represents an acute cardiac event.  Additional evaluation and  management per cardiology.  Ceaser Ebeling K. 11/19/2019, 3:30 PM

## 2019-11-19 NOTE — ED Notes (Signed)
Patient transported to X-ray 

## 2019-11-19 NOTE — ED Notes (Signed)
Pt is aware that we need a urine sample 

## 2019-11-19 NOTE — ED Notes (Signed)
Back from CT

## 2019-11-19 NOTE — ED Notes (Signed)
Per verbal Dr. Vanita Panda tritrated to 7.5

## 2019-11-19 NOTE — ED Notes (Signed)
Patient transported to US 

## 2019-11-19 NOTE — ED Notes (Addendum)
Off floor to xray.

## 2019-11-19 NOTE — ED Notes (Signed)
Spoke with provider, page if systolic goes above 088. Will wean pt off of cardene and attempt to control BP with oral meds. Awaiting change in oral medications prior to admin of ordered day shift meds.

## 2019-11-19 NOTE — ED Notes (Signed)
Pt resp 25-30 but profusion is good. Pt requested oxygen for comfort put on 2L

## 2019-11-19 NOTE — H&P (Addendum)
[facility] Admission History and Physical Service Pager: [phone]  Patient name: Samuel Burns Medical record number: [id number] Date of birth: [birth year] Age: 34 y.o. Gender: male  Primary Care Provider: Patient, No Pcp Per Consultants: Nephrology Code Status: FULL  Chief Complaint: Nausea  Assessment and Plan: Jaggar Benko is a 34 y.o. male presenting with nausea and vomiting found to have hypertensive crisis with acute renal failure. PMH is significant for HTN.  Acute renal failure Pt presents w/ 2 weeks decreased urine output, significant elevated creatinine of 34.2, BUN of 257, and anion gap metabolic acidosis. Hx of uncontrolled HTN and presenting today in hypertensive emergendcy, renal failure most  likely secondary to uncontrolled hypertension.  Nephrology consulted, appreciate involvement.  He will need dialysis here, possibly ESRD requiring long-term dialysis given severity of illness.  Per nephrology, plan for placement of tunneled HD catheter tomorrow by IR and vascular surgery consult for permanent access and outpatient dialysis placement at some point during hospitalization. - admit to FPTS, progressive, Dr. Ardelia Mems attending - plan for placement of tunneled HD catheter tomorrow by IR to initiate HD - AM renal function panel, HbA1c - up with assistance - renal diet, NPO after midnight for procedure - vitals per routine - PT/OT - strict I/O - daily weights  Hypertensive emergency Significantly hypertensive to 248/137 on admission this morning with evidence of kidney damage as noted above.  Patient has a history of hypertension and has apparently not been taking his medications for years.  Patient was started on a nicardipine drip in the ED with improvement in his blood pressure with goal to maintain BP around 818 systolic.  Concern for aortic dissection given 2-day history of right thoracic back pain.  Unable to obtain CTA due to renal failure  but  CXR without mediastinal widening and CT chest without evidence of aortic dissection. Current BP 148/82 with nicardipine rate 5 mg/h. - wean off nicardipine drip, goal < 299 systolic - amlodipine 10 mg daily - carvedilol 3.125 mg BID - if unable to wean off gtt, will need to go to ICU for mgmt of hypertensive emergency.  Elevated troponin and BNP Elevated troponin 592 on admission, no significant change on repeat at 579. BNP > 1500.  Although this is elevated in part due to his renal failure, Likely has some degree of CHF given his findings of cardiomegaly on imaging and loud systolic murmur on exam.  EKG with some flipped T waves (nonspecific finding) and no ST elevations. Discussed with cardiologist on call.  Most likely secondary to renal failure and hypertensive emergency.  Do not suspect acute cardiac event. - EKG in AM - f/u echo  Thoracic back pain 2 day history of severe back pain.  No back pain history prior.  Dissection and aneurysm effectively ruled out, altough we could not use contrast with CT chest/abd. Gallstones with thickened gallbladder wall seen on ct suggestive of acute cholecystitis.  Pt w/o RUQ pain, fever, or leukocytosis.  Will get ruq [place] for further eval.   - f/u ruq [place]. - fentanyl 60mcg q2h for severe pain - tylenol for moderate pain   Normocytic anemia Hemoglobin 7.3 on presentation.  Could represent anemia of CKD or iron deficiency anemia. Will obtain labs to assess for iron status, vitamin B12, reticulocytes, and hemolysis. - AM labs: Fe, TIBC, ferritin, LDH, haptoglobin, retics, B12  Covid vaccine Patient is not vaccinated against Covid.  He is interested in getting the Covid vaccine here. - will arrange  for covid vaccine during admission if possible  FEN/GI: renal diet, NPO after midnight  Prophylaxis: [facility]  Disposition: progressive  History of Present Illness:  Samuel Burns is a 34 y.o. male presenting with 2 weeks of nausea and 2 days of back  pain.  Patient states for the past 2 weeks, he has felt nausea, fatigue, decreased appetite, and altered sense of taste and smell.  He began having back pain on his lower right side started 2 days ago which came on all of a sudden while he was laying down.  He also had an episode of vomiting yesterday and an episode of diarrhea this morning which was nonbloody.  He has been urinating less, but still urinating 1-2 times a day.  No fevers/chills.  He has a history of hypertension diagnosed over 5 years ago.  He states he was previously on a medication that started with L, but does not remember what it was.  Currently, he has not taken his medications for years.  Not vaccinated against COVID, but is interested in getting the Covid vaccine.   No surgical history.   Dad recently died from kidney failure.  On dialysis for 2 years.  Dad was 82 yo.     Review Of Systems: Per HPI with the following additions:   Review of Systems  Constitutional: Positive for malaise/fatigue and weight loss. Negative for chills, diaphoresis and fever.  HENT: Negative for sore throat.   Eyes: Negative for blurred vision and double vision.  Respiratory: Positive for shortness of breath. Negative for cough.   Cardiovascular: Negative for chest pain, palpitations, orthopnea and leg swelling.  Gastrointestinal: Positive for abdominal pain, diarrhea, nausea and vomiting. Negative for blood in stool.  Genitourinary: Positive for flank pain. Negative for dysuria and frequency.  Musculoskeletal: Positive for back pain. Negative for falls.  Skin: Negative for rash.  Neurological: Positive for headaches. Negative for dizziness and tremors.  Endo/Heme/Allergies: Does not bruise/bleed easily.  Psychiatric/Behavioral: Positive for substance abuse. Negative for depression, hallucinations and memory loss.    Patient Active Problem List   Diagnosis Date Noted  . Acute renal failure (ARF) ([place]) 11/19/2019    Past Medical  History: Past Medical History:  Diagnosis Date  . Asthma     Past Surgical History: History reviewed. No pertinent surgical history.  Social History: Social History   Tobacco Use  . Smoking status: Never Smoker  . Smokeless tobacco: Never Used  Substance Use Topics  . Alcohol use: No  . Drug use: Yes    Types: Marijuana   Additional social history: daily marijuana user, seldom alcohol, denies smoking tobacco.  Recently moved from [place]. Please also refer to relevant sections of EMR.  Family History: Family History  Problem Relation Age of Onset  . Hypertension Maternal Grandmother     Allergies and Medications: No Known Allergies No current facility-administered medications on file prior to encounter.   Current Outpatient Medications on File Prior to Encounter  Medication Sig Dispense Refill  . acetaminophen (TYLENOL) 500 MG tablet Take 500-1,000 mg by mouth every 6 (six) hours as needed (for pain).    Marland Kitchen amLODipine (NORVASC) 10 MG tablet Take 1 tablet (10 mg total) by mouth daily. (Patient not taking: Reported on 11/19/2019) 90 tablet 1  . atorvastatin (LIPITOR) 10 MG tablet Take 1 tablet (10 mg total) by mouth daily. (Patient not taking: Reported on 11/19/2019) 90 tablet 3  . cetirizine-pseudoephedrine (ZYRTEC-D) 5-120 MG tablet Take 1 tablet by mouth daily. (Patient not  taking: Reported on 11/19/2019) 30 tablet 0  . losartan (COZAAR) 100 MG tablet Take 1 tablet (100 mg total) by mouth daily. (Patient not taking: Reported on 11/19/2019) 90 tablet 3    Objective: BP (!) 148/82   Pulse 99   Temp 97.9 F (36.6 C) (Oral)   Resp (!) 24   SpO2 100%  Exam: General: Young male lying in bed, uncomfortably appearing, NAD Eyes: PERRL, EOMI ENTM: MM somewhat dry Neck: supple Cardiovascular: Tachycardic, regular rhythm, 3/6 systolic murmur best heard at the LUSB Respiratory: CTAB, no wheezes or rales, no respiratory distress Gastrointestinal: soft, mild diffuse tenderness,  no CVA tenderness MSK: moving all extremities, back with no tenderness to palpation Derm: no rash Neuro: alert, interactive, CN II-XII intact, 5/5 strength upper and lower extremities bilaterally  Psych: affect appropriate  Labs and Imaging: CBC BMET  Recent Labs  Lab 11/19/19 0906  WBC 13.7*  HGB 7.3*  HCT 21.8*  PLT 58*   Recent Labs  Lab 11/19/19 0906  NA 135  K 3.7  CL 89*  CO2 15*  BUN 257*  CREATININE 34.21*  GLUCOSE 131*  CALCIUM 9.5      Zola Button, MD 11/19/2019, 6:08 PM PGY-1, [facility] pager: [phone], text pages welcome

## 2019-11-19 NOTE — ED Triage Notes (Addendum)
Pt reports R sided thoracic back pain x 3 days.  States pain doesn't change with movement.  Reports nausea x 2 weeks and vomiting/diarrhea x 2-3 days.  Denies SOB. States his taste and smell has changed.  No known COVID contacts.  Hasn't had BP medication in 3 weeks.

## 2019-11-19 NOTE — Progress Notes (Signed)
Discussed patient with ICU given his nicardipine drip status.  Patient blood pressure already down to less than 425 systolic from a high of 956 this morning.  Will transition the patient off of nicardipine drip and start oral amlodipine and IV labetalol as needed.  Have discussed this with the patient's nurse who will start to titrate down the nicardipine rate which is currently at 5 mg/h to 2.5 mg/h.  If patient's blood pressure becomes uncontrollable with the amlodipine and IV labetalol is to be placed back on a antihypertensive drip we will contact the ICU.

## 2019-11-19 NOTE — ED Provider Notes (Signed)
[facility] EMERGENCY DEPARTMENT Provider Note   CSN: [id number] Arrival date & time: 11/19/19  6568     History Chief Complaint  Patient presents with  . Back Pain  . Emesis  . Diarrhea    Samuel Burns is a 34 y.o. male.  34 year old male with a history of asthma and hypertension presents to the ED for nausea and vomiting.  Reports that symptoms began 2 weeks ago after eating waffles.  He has had persistent nausea with at least 1-2 episodes of vomiting daily.  States that it has been difficult to tolerate oral food and fluids secondary to nausea and emesis.  Developed pain in his right mid back 2 days ago which has been constant.  It is aggravated with movement and breathing, but is not necessarily made worse with palpation.  He has been taking Tylenol for symptoms without relief.  No fevers, diaphoresis, chest pain, abdominal pain, extremity numbness or paresthesias, extremity weakness, syncope or near syncope.  Had a Covid test on Friday which was negative.  Recently relocated back to [facility] a few months ago.  Is staying with the mother of his son.  Triage note references noncompliance with antihypertensive medication x3 weeks, but he reports that he has actually not taken this medication in approximately 1 year.  He is not followed by a primary care doctor.   Back Pain Emesis Associated symptoms: diarrhea   Diarrhea Associated symptoms: vomiting        Past Medical History:  Diagnosis Date  . Asthma     There are no problems to display for this patient.   History reviewed. No pertinent surgical history.     Family History  Problem Relation Age of Onset  . Hypertension Maternal Grandmother     Social History   Tobacco Use  . Smoking status: Never Smoker  . Smokeless tobacco: Never Used  Substance Use Topics  . Alcohol use: No  . Drug use: Yes    Types: Marijuana    Home Medications Prior to Admission medications   Medication Sig  Start Date End Date Taking? Authorizing Provider  acetaminophen (TYLENOL) 500 MG tablet Take 500-1,000 mg by mouth every 6 (six) hours as needed (for pain).   Yes [provider]  amLODipine (NORVASC) 10 MG tablet Take 1 tablet (10 mg total) by mouth daily. Patient not taking: Reported on 11/19/2019 04/08/17   Jaynee Eagles, PA-C  atorvastatin (LIPITOR) 10 MG tablet Take 1 tablet (10 mg total) by mouth daily. Patient not taking: Reported on 11/19/2019 04/15/17   Jaynee Eagles, PA-C  cetirizine-pseudoephedrine (ZYRTEC-D) 5-120 MG tablet Take 1 tablet by mouth daily. Patient not taking: Reported on 11/19/2019 03/23/17   Frederica Kuster, PA-C  losartan (COZAAR) 100 MG tablet Take 1 tablet (100 mg total) by mouth daily. Patient not taking: Reported on 11/19/2019 04/08/17   Jaynee Eagles, PA-C    Allergies    Patient has no known allergies.  Review of Systems   Review of Systems  Gastrointestinal: Positive for diarrhea and vomiting.  Musculoskeletal: Positive for back pain.  Ten systems reviewed and are negative for acute change, except as noted in the HPI.    Physical Exam Updated Vital Signs BP (!) 162/82   Pulse 95   Temp 97.9 F (36.6 C) (Oral)   Resp (!) 22   SpO2 100%   Physical Exam Vitals and nursing note reviewed.  Constitutional:      General: He is not in acute distress.  Appearance: He is well-developed. He is not diaphoretic.     Comments: Patient in NAD  HENT:     Head: Normocephalic and atraumatic.  Eyes:     General: No scleral icterus.    Conjunctiva/sclera: Conjunctivae normal.  Cardiovascular:     Rate and Rhythm: Normal rate and regular rhythm.     Pulses: Normal pulses.  Pulmonary:     Effort: Pulmonary effort is normal. No respiratory distress.     Breath sounds: No stridor. No wheezing.     Comments: Respirations even and unlabored Abdominal:     General: There is no distension.     Palpations: Abdomen is soft.  Musculoskeletal:        General:  Normal range of motion.     Cervical back: Normal range of motion.  Skin:    General: Skin is warm and dry.     Coloration: Skin is not pale.     Findings: No erythema or rash.  Neurological:     Mental Status: He is alert and oriented to person, place, and time.     Coordination: Coordination normal.     Comments: GCS 15. Patient moving all extremities spontaneously.  Psychiatric:        Behavior: Behavior normal.     ED Results / Procedures / Treatments   Labs (all labs ordered are listed, but only abnormal results are displayed) Labs Reviewed  LIPASE, BLOOD - Abnormal; Notable for the following components:      Result Value   Lipase 169 (*)    All other components within normal limits  COMPREHENSIVE METABOLIC PANEL - Abnormal; Notable for the following components:   Chloride 89 (*)    CO2 15 (*)    Glucose, Bld 131 (*)    BUN 257 (*)    Creatinine, Ser 34.21 (*)    AST 13 (*)    Alkaline Phosphatase 232 (*)    Total Bilirubin 2.3 (*)    GFR calc non Af Amer 1 (*)    GFR calc Af Amer 2 (*)    Anion gap 31 (*)    All other components within normal limits  CBC - Abnormal; Notable for the following components:   WBC 13.7 (*)    RBC 2.44 (*)    Hemoglobin 7.3 (*)    HCT 21.8 (*)    RDW 18.8 (*)    Platelets 58 (*)    All other components within normal limits  BRAIN NATRIURETIC PEPTIDE - Abnormal; Notable for the following components:   B Natriuretic Peptide 1,267.0 (*)    All other components within normal limits  TROPONIN I (HIGH SENSITIVITY) - Abnormal; Notable for the following components:   Troponin I (High Sensitivity) 592 (*)    All other components within normal limits  RESPIRATORY PANEL BY RT PCR (FLU A&B, COVID)  URINALYSIS, ROUTINE W REFLEX MICROSCOPIC    EKG EKG Interpretation  Date/Time:  Sunday November 19 2019 08:54:59 EDT Ventricular Rate:  99 PR Interval:  168 QRS Duration: 114 QT Interval:  428 QTC Calculation: 549 R Axis:   3 Text  Interpretation: Normal sinus rhythm Biatrial enlargement ST-t wave abnormality Artifact QT prolonged Abnormal ECG Confirmed by Carmin Muskrat [phone]) on 11/19/2019 12:25:14 PM   Radiology CT ABDOMEN PELVIS WO CONTRAST  Result Date: 11/19/2019 CLINICAL DATA:  Right-sided thoracic back pain, nausea, vomiting, diarrhea, aortic dissection suspected EXAM: CT CHEST, ABDOMEN AND PELVIS WITHOUT CONTRAST TECHNIQUE: Multidetector CT imaging of the chest, abdomen and  pelvis was performed following the standard protocol without IV contrast. COMPARISON:  None. FINDINGS: CT CHEST FINDINGS Cardiovascular: No significant vascular findings. Cardiomegaly. No pericardial effusion. Mediastinum/Nodes: No enlarged mediastinal, hilar, or axillary lymph nodes. Thyroid gland, trachea, and esophagus demonstrate no significant findings. Lungs/Pleura: Dependent bibasilar heterogeneous atelectasis or consolidation. No pleural effusion or pneumothorax. Musculoskeletal: No chest wall mass or suspicious bone lesions identified. CT ABDOMEN PELVIS FINDINGS Hepatobiliary: No solid liver abnormality is seen. The gallbladder is distended with wall thickening (series 3, image 77). No radiopaque calculi identified. No biliary ductal dilatation Pancreas: Unremarkable. No pancreatic ductal dilatation or surrounding inflammatory changes. Spleen: Normal in size without significant abnormality. Adrenals/Urinary Tract: Adrenal glands are unremarkable. Kidneys are normal, without renal calculi, solid lesion, or hydronephrosis. Bladder is unremarkable. Stomach/Bowel: Stomach is within normal limits. Appendix appears normal. No evidence of bowel wall thickening, distention, or inflammatory changes. Vascular/Lymphatic: No significant vascular findings are present. No enlarged abdominal or pelvic lymph nodes. Reproductive: No mass or other abnormality. Other: No abdominal wall hernia or abnormality. No abdominopelvic ascites. Musculoskeletal: No acute or  significant osseous findings. IMPRESSION: 1. The gallbladder is distended with wall thickening. No radiopaque calculi identified. No biliary ductal dilatation. Findings are concerning for acute cholecystitis. 2. Predominantly dependent bibasilar heterogeneous atelectasis or consolidation. Findings are concerning for infection or aspiration. 3. No obvious evidence of aortic aneurysm or dissection, however please note that noncontrast CT is very limited for the evaluation of aortic dissection and other acute aortic pathology. 4. Cardiomegaly. Electronically Signed   By: Eddie Candle M.D.   On: 11/19/2019 14:38   DG Chest 2 View  Result Date: 11/19/2019 CLINICAL DATA:  Right back pain EXAM: CHEST - 2 VIEW COMPARISON:  None. FINDINGS: Lungs are clear.  No pleural effusion or pneumothorax. Cardiomegaly. Visualized osseous structures are within normal limits. IMPRESSION: No evidence of acute cardiopulmonary disease. Electronically Signed   By: Julian Hy M.D.   On: 11/19/2019 13:19   CT CHEST WO CONTRAST  Result Date: 11/19/2019 CLINICAL DATA:  Right-sided thoracic back pain, nausea, vomiting, diarrhea, aortic dissection suspected EXAM: CT CHEST, ABDOMEN AND PELVIS WITHOUT CONTRAST TECHNIQUE: Multidetector CT imaging of the chest, abdomen and pelvis was performed following the standard protocol without IV contrast. COMPARISON:  None. FINDINGS: CT CHEST FINDINGS Cardiovascular: No significant vascular findings. Cardiomegaly. No pericardial effusion. Mediastinum/Nodes: No enlarged mediastinal, hilar, or axillary lymph nodes. Thyroid gland, trachea, and esophagus demonstrate no significant findings. Lungs/Pleura: Dependent bibasilar heterogeneous atelectasis or consolidation. No pleural effusion or pneumothorax. Musculoskeletal: No chest wall mass or suspicious bone lesions identified. CT ABDOMEN PELVIS FINDINGS Hepatobiliary: No solid liver abnormality is seen. The gallbladder is distended with wall  thickening (series 3, image 77). No radiopaque calculi identified. No biliary ductal dilatation Pancreas: Unremarkable. No pancreatic ductal dilatation or surrounding inflammatory changes. Spleen: Normal in size without significant abnormality. Adrenals/Urinary Tract: Adrenal glands are unremarkable. Kidneys are normal, without renal calculi, solid lesion, or hydronephrosis. Bladder is unremarkable. Stomach/Bowel: Stomach is within normal limits. Appendix appears normal. No evidence of bowel wall thickening, distention, or inflammatory changes. Vascular/Lymphatic: No significant vascular findings are present. No enlarged abdominal or pelvic lymph nodes. Reproductive: No mass or other abnormality. Other: No abdominal wall hernia or abnormality. No abdominopelvic ascites. Musculoskeletal: No acute or significant osseous findings. IMPRESSION: 1. The gallbladder is distended with wall thickening. No radiopaque calculi identified. No biliary ductal dilatation. Findings are concerning for acute cholecystitis. 2. Predominantly dependent bibasilar heterogeneous atelectasis or consolidation. Findings are concerning for infection  or aspiration. 3. No obvious evidence of aortic aneurysm or dissection, however please note that noncontrast CT is very limited for the evaluation of aortic dissection and other acute aortic pathology. 4. Cardiomegaly. Electronically Signed   By: Eddie Candle M.D.   On: 11/19/2019 14:38   US Aorta  Result Date: 11/19/2019 CLINICAL DATA:  34 year old male with hypertensive emergency and back pain for 3 days. EXAM: ULTRASOUND OF ABDOMINAL AORTA TECHNIQUE: Ultrasound examination of the abdominal aorta and proximal common iliac arteries was performed to evaluate for aneurysm. Additional color and Doppler images of the distal aorta were obtained to document patency. COMPARISON:  None. FINDINGS: Abdominal aortic measurements as follows: Proximal:  2.4 x 2.6 cm (AP by transverse) Mid:         2.0 x 1.9  cm Distal:      1.8 x 2.0 cm Patent: Yes, peak systolic velocity is 496 cm/s Aortoiliac bifurcation and proximal iliac arteries are obscured by overlying bowel gas. IMPRESSION: Negative for abdominal aortic aneurysm. Electronically Signed   By: Genevie Ann M.D.   On: 11/19/2019 13:29    Procedures .Critical Care Performed by: Antonietta Breach, PA-C Authorized by: Antonietta Breach, PA-C   Critical care provider statement:    Critical care time (minutes):  45   Critical care was necessary to treat or prevent imminent or life-threatening deterioration of the following conditions:  Metabolic crisis and renal failure (hypertensive emergency; renal failure; acidosis.)   Critical care was time spent personally by me on the following activities:  Discussions with consultants, evaluation of patient's response to treatment, examination of patient, ordering and performing treatments and interventions, ordering and review of laboratory studies, ordering and review of radiographic studies, pulse oximetry, re-evaluation of patient's condition, obtaining history from patient or surrogate and review of old charts   (including critical care time)  Medications Ordered in ED Medications  nicardipine (CARDENE) 20mg  in 0.86% saline 248ml IV infusion (0.1 mg/ml) (5 mg/hr Intravenous Rate/Dose Change 11/19/19 1403)  fentaNYL (SUBLIMAZE) injection 50 mcg (has no administration in time range)  sodium chloride 0.9 % bolus 1,000 mL (1,000 mLs Intravenous New Bag/Given 11/19/19 1329)  ondansetron (ZOFRAN) injection 4 mg (4 mg Intravenous Given 11/19/19 1325)    ED Course  I have reviewed the triage vital signs and the nursing notes.  Pertinent labs & imaging results that were available during my care of the patient were reviewed by me and considered in my medical decision making (see chart for details).  Clinical Course as of Nov 18 1498  Sun Nov 19, 2019  1247 Patient with nausea and vomiting x2 weeks with right thoracic back  pain x2 days which has been constant.  It is not reproducible on palpation and he has no history of injury or trauma.  He is extremely hypertensive with a history of hypertension.  Has been noncompliant with medications x1 year.  Labs were initiated in triage.  Patient found to be in renal failure with creatinine of 34.21.  Is making urine 1-2 times/day.  Acute renal failure likely contributing to metabolic acidosis, anion gap of 31.  It is unlikely that dehydration is solely responsible for his degree of renal failure.  Clinical picture is more consistent with hypertensive emergency.    In the setting of right thoracic back pain, aortic dissection considered.  It is reassuring that he has no chest or abdominal pain or sensory changes in his lower extremities.  BP equally hypertensive in bilateral arms.  Unable to receive  contrast given degree of creatinine.  Will obtain chest x-ray as well as ultrasound of the aorta.  Can proceed with noncontrast CT if clinically indicated.  Unclear cause of anemia with hemoglobin of 7.3.  This may be related to his renal function.  Leukocytosis felt to be stress related at this time.  Patient to require admission.   [MB]  5597 Dr. Posey Pronto of nephrology consulted. Nephrology service to assess patient during admission. Anticipate need for dialysis.   [KH]  1409 BP improving with Cardene titration. Currently on 5mg . Will maintain BP around 416 systolic.  Troponin elevated at 592. This is favored to be secondary to HTN emergency rather than MI. BNP 1267.  CTs of chest and abdomen/pelvis ordered. Initial CXR and [place] reassuring; cardiomegaly noted.   [KH]    Clinical Course User Index [KH] Beverely Pace   MDM Rules/Calculators/A&P                          34 year old male to be admitted for management of hypertensive emergency with signs of organ damage.  Has acute renal failure with creatinine of 34.2.  Still voiding 1-2x/day, per patient.  Nephrology consulted  and will follow.  Troponin elevated at 592.  Complained of right-sided thoracic back pain, but work-up today not suggestive of aortic dissection.  No PTX, PNA, effusion.  Pain is not reproducible on palpation.  Started on Cardene gtt for BP management. Will admit to [facility] Medicine teaching service for ongoing inpatient care.   Final Clinical Impression(s) / ED Diagnoses Final diagnoses:  Hypertensive emergency  Acute renal failure, unspecified acute renal failure type ([place])  Elevated troponin  Metabolic acidosis  Acute right-sided thoracic back pain    Rx / DC Orders ED Discharge Orders    None       Antonietta Breach, PA-C 11/19/19 1506    Carmin Muskrat, MD 11/20/19 0900

## 2019-11-20 ENCOUNTER — Inpatient Hospital Stay (HOSPITAL_COMMUNITY): Payer: Self-pay

## 2019-11-20 DIAGNOSIS — I248 Other forms of acute ischemic heart disease: Secondary | ICD-10-CM

## 2019-11-20 DIAGNOSIS — N186 End stage renal disease: Secondary | ICD-10-CM

## 2019-11-20 DIAGNOSIS — Z992 Dependence on renal dialysis: Secondary | ICD-10-CM

## 2019-11-20 DIAGNOSIS — I161 Hypertensive emergency: Secondary | ICD-10-CM

## 2019-11-20 DIAGNOSIS — I4892 Unspecified atrial flutter: Secondary | ICD-10-CM

## 2019-11-20 DIAGNOSIS — N171 Acute kidney failure with acute cortical necrosis: Secondary | ICD-10-CM

## 2019-11-20 HISTORY — PX: IR US GUIDE VASC ACCESS RIGHT: IMG2390

## 2019-11-20 HISTORY — PX: IR FLUORO GUIDE CV LINE RIGHT: IMG2283

## 2019-11-20 LAB — RENAL FUNCTION PANEL
Albumin: 3.6 g/dL (ref 3.5–5.0)
Anion gap: 29 — ABNORMAL HIGH (ref 5–15)
BUN: 266 mg/dL — ABNORMAL HIGH (ref 6–20)
CO2: 13 mmol/L — ABNORMAL LOW (ref 22–32)
Calcium: 8.7 mg/dL — ABNORMAL LOW (ref 8.9–10.3)
Chloride: 92 mmol/L — ABNORMAL LOW (ref 98–111)
Creatinine, Ser: 35.25 mg/dL — ABNORMAL HIGH (ref 0.61–1.24)
GFR calc Af Amer: 2 mL/min — ABNORMAL LOW (ref >60)
GFR calc non Af Amer: 1 mL/min — ABNORMAL LOW (ref >60)
Glucose, Bld: 109 mg/dL — ABNORMAL HIGH (ref 70–99)
Phosphorus: >30 mg/dL — ABNORMAL HIGH (ref 2.5–4.6)
Potassium: 3.7 mmol/L (ref 3.5–5.1)
Sodium: 134 mmol/L — ABNORMAL LOW (ref 135–145)

## 2019-11-20 LAB — CBC
HCT: 19.1 % — ABNORMAL LOW (ref 39.0–52.0)
HCT: 21.9 % — ABNORMAL LOW (ref 39.0–52.0)
Hemoglobin: 6.4 g/dL — CL (ref 13.0–17.0)
Hemoglobin: 7.5 g/dL — ABNORMAL LOW (ref 13.0–17.0)
MCH: 30.5 pg (ref 26.0–34.0)
MCH: 30.6 pg (ref 26.0–34.0)
MCHC: 33.5 g/dL (ref 30.0–36.0)
MCHC: 34.2 g/dL (ref 30.0–36.0)
MCV: 91 fL (ref 80.0–100.0)
MCV: 89.4 fL (ref 80.0–100.0)
Platelets: 67 10*3/uL — ABNORMAL LOW (ref 150–400)
Platelets: 74 10*3/uL — ABNORMAL LOW (ref 150–400)
RBC: 2.1 MIL/uL — ABNORMAL LOW (ref 4.22–5.81)
RBC: 2.45 MIL/uL — ABNORMAL LOW (ref 4.22–5.81)
RDW: 18.5 % — ABNORMAL HIGH (ref 11.5–15.5)
RDW: 17.4 % — ABNORMAL HIGH (ref 11.5–15.5)
WBC: 9.5 10*3/uL (ref 4.0–10.5)
WBC: 11.7 10*3/uL — ABNORMAL HIGH (ref 4.0–10.5)
nRBC: 0.3 % — ABNORMAL HIGH (ref 0.0–0.2)
nRBC: 0.2 % (ref 0.0–0.2)

## 2019-11-20 LAB — APTT: aPTT: 47 seconds — ABNORMAL HIGH (ref 24–36)

## 2019-11-20 LAB — LIPID PANEL
Cholesterol: 186 mg/dL (ref 0–200)
HDL: 30 mg/dL — ABNORMAL LOW (ref >40)
LDL Cholesterol: 115 mg/dL — ABNORMAL HIGH (ref 0–99)
Total CHOL/HDL Ratio: 6.2 RATIO
Triglycerides: 207 mg/dL — ABNORMAL HIGH (ref <150)
VLDL: 41 mg/dL — ABNORMAL HIGH (ref 0–40)

## 2019-11-20 LAB — IRON AND TIBC
Iron: 40 ug/dL — ABNORMAL LOW (ref 45–182)
Saturation Ratios: 17 % — ABNORMAL LOW (ref 17.9–39.5)
TIBC: 238 ug/dL — ABNORMAL LOW (ref 250–450)
UIBC: 198 ug/dL

## 2019-11-20 LAB — HEPATITIS B SURFACE ANTIGEN: Hepatitis B Surface Ag: NONREACTIVE

## 2019-11-20 LAB — PROTIME-INR
INR: 1.1 (ref 0.8–1.2)
Prothrombin Time: 14.2 seconds (ref 11.4–15.2)

## 2019-11-20 LAB — PREPARE RBC (CROSSMATCH)

## 2019-11-20 LAB — RETICULOCYTES
Immature Retic Fract: 28.8 % — ABNORMAL HIGH (ref 2.3–15.9)
RBC.: 2.12 MIL/uL — ABNORMAL LOW (ref 4.22–5.81)
Retic Count, Absolute: 212.8 10*3/uL — ABNORMAL HIGH (ref 19.0–186.0)
Retic Ct Pct: 10 % — ABNORMAL HIGH (ref 0.4–3.1)

## 2019-11-20 LAB — HEMOGLOBIN A1C
Hgb A1c MFr Bld: 3.5 % — ABNORMAL LOW (ref 4.8–5.6)
Mean Plasma Glucose: 53.75 mg/dL

## 2019-11-20 LAB — ABO/RH: ABO/RH(D): O POS

## 2019-11-20 LAB — LACTATE DEHYDROGENASE: LDH: 743 U/L — ABNORMAL HIGH (ref 98–192)

## 2019-11-20 LAB — D-DIMER, QUANTITATIVE: D-Dimer, Quant: 4.07 ug/mL-FEU — ABNORMAL HIGH (ref 0.00–0.50)

## 2019-11-20 LAB — SAVE SMEAR(SSMR), FOR PROVIDER SLIDE REVIEW

## 2019-11-20 LAB — VITAMIN B12: Vitamin B-12: 1290 pg/mL — ABNORMAL HIGH (ref 180–914)

## 2019-11-20 LAB — TROPONIN I (HIGH SENSITIVITY)
Troponin I (High Sensitivity): 1250 ng/L (ref <18)
Troponin I (High Sensitivity): 1368 ng/L (ref <18)
Troponin I (High Sensitivity): 1453 ng/L (ref <18)
Troponin I (High Sensitivity): 1715 ng/L (ref <18)

## 2019-11-20 LAB — FERRITIN: Ferritin: 1046 ng/mL — ABNORMAL HIGH (ref 24–336)

## 2019-11-20 LAB — DIRECT ANTIGLOBULIN TEST (NOT AT ARMC)
DAT, IgG: NEGATIVE
DAT, complement: NEGATIVE

## 2019-11-20 LAB — FIBRINOGEN: Fibrinogen: 465 mg/dL (ref 210–475)

## 2019-11-20 MED ORDER — HYDROMORPHONE HCL 1 MG/ML IJ SOLN
1.0000 mg | INTRAMUSCULAR | Status: DC | PRN
  Filled 2019-11-20 (×2): qty 1

## 2019-11-20 MED ORDER — HEPARIN SODIUM (PORCINE) 1000 UNIT/ML IJ SOLN
INTRAMUSCULAR | Status: AC
  Filled 2019-11-20: qty 1

## 2019-11-20 MED ORDER — LIDOCAINE HCL (PF) 1 % IJ SOLN
INTRAMUSCULAR | Status: AC
  Filled 2019-11-20: qty 30

## 2019-11-20 MED ORDER — METOPROLOL TARTRATE 5 MG/5ML IV SOLN
5.0000 mg | Freq: Once | INTRAVENOUS | Status: AC
  Administered 2019-11-20: 5 mg via INTRAVENOUS
  Filled 2019-11-20: qty 5

## 2019-11-20 MED ORDER — HEPARIN SODIUM (PORCINE) 1000 UNIT/ML IJ SOLN
INTRAMUSCULAR | Status: AC | PRN
  Administered 2019-11-20: 1000 [IU] via INTRAVENOUS

## 2019-11-20 MED ORDER — CHLORHEXIDINE GLUCONATE CLOTH 2 % EX PADS
6.0000 | MEDICATED_PAD | Freq: Every day | CUTANEOUS | Status: DC
  Administered 2019-11-21: 6 via TOPICAL

## 2019-11-20 MED ORDER — METOPROLOL TARTRATE 25 MG PO TABS: 25.0000 mg | ORAL_TABLET | Freq: Four times a day (QID) | ORAL | Status: DC

## 2019-11-20 MED ORDER — HEPARIN SODIUM (PORCINE) 1000 UNIT/ML IJ SOLN
INTRAMUSCULAR | Status: AC
  Administered 2019-11-20: 3800 [IU]
  Filled 2019-11-20: qty 4

## 2019-11-20 MED ORDER — SODIUM BICARBONATE 650 MG PO TABS
1300.0000 mg | ORAL_TABLET | Freq: Three times a day (TID) | ORAL | Status: DC
  Administered 2019-11-20 – 2019-11-23 (×10): 1300 mg via ORAL
  Filled 2019-11-20 (×13): qty 2

## 2019-11-20 MED ORDER — SODIUM CHLORIDE 0.9% IV SOLUTION: Freq: Once | INTRAVENOUS | Status: DC

## 2019-11-20 MED ORDER — LIDOCAINE-EPINEPHRINE 1 %-1:100000 IJ SOLN
INTRAMUSCULAR | Status: AC
  Filled 2019-11-20: qty 1

## 2019-11-20 MED ORDER — HYDRALAZINE HCL 20 MG/ML IJ SOLN: 10.0000 mg | INTRAMUSCULAR | Status: DC | PRN

## 2019-11-20 MED ORDER — MIDAZOLAM HCL 2 MG/2ML IJ SOLN
INTRAMUSCULAR | Status: AC
  Filled 2019-11-20: qty 2

## 2019-11-20 MED ORDER — FENTANYL CITRATE (PF) 100 MCG/2ML IJ SOLN
INTRAMUSCULAR | Status: AC
  Filled 2019-11-20: qty 2

## 2019-11-20 MED ORDER — FENTANYL CITRATE (PF) 100 MCG/2ML IJ SOLN
INTRAMUSCULAR | Status: AC | PRN
  Administered 2019-11-20: 50 ug via INTRAVENOUS

## 2019-11-20 MED ORDER — METOPROLOL TARTRATE 25 MG PO TABS
25.0000 mg | ORAL_TABLET | Freq: Four times a day (QID) | ORAL | Status: DC
  Administered 2019-11-20 – 2019-11-21 (×2): 25 mg via ORAL
  Filled 2019-11-20 (×2): qty 1

## 2019-11-20 MED ORDER — METOPROLOL TARTRATE 5 MG/5ML IV SOLN: 5.0000 mg | INTRAVENOUS | Status: DC | PRN

## 2019-11-20 MED ORDER — CHLORHEXIDINE GLUCONATE CLOTH 2 % EX PADS
6.0000 | MEDICATED_PAD | Freq: Every day | CUTANEOUS | Status: DC
  Administered 2019-11-21 – 2019-11-27 (×4): 6 via TOPICAL

## 2019-11-20 MED ORDER — LIDOCAINE HCL 1 % IJ SOLN
INTRAMUSCULAR | Status: AC | PRN
  Administered 2019-11-20: 20 mL via INTRADERMAL

## 2019-11-20 MED ORDER — LABETALOL HCL 5 MG/ML IV SOLN: 0.0000 mg | INTRAVENOUS | Status: DC | PRN

## 2019-11-20 MED ORDER — MIDAZOLAM HCL 2 MG/2ML IJ SOLN
INTRAMUSCULAR | Status: AC | PRN
  Administered 2019-11-20: 1 mg via INTRAVENOUS

## 2019-11-20 NOTE — Progress Notes (Signed)
Patient back from Dialysis. Alert and oriented. Patient tolerated dialysis well per dialysis report. EKG performed after dialysis. MD visited patient at bedside. Called wife [place] for update after dialysis as requested phone rang to unknown voicemail.

## 2019-11-20 NOTE — Progress Notes (Signed)
FPTS Interim Progress Note  Update of today's events since approximately 10 AM:  -Nurse paged about tachyarrhythmia concerning for atrial flutter after patient returned from having dialysis catheter placed.  Went to bedside and patient was tachycardic into the 140s.  Metoprolol IV 5 mg was ordered.  Cardiology was consulted for this in addition to his hypertensive emergency and likely subsequent CHF, although echo had not been done at that time  -Was paged again by nurse this time for having persistent oozing around the site of his catheter.  Went to bedside and confirmed that there was bleeding coming from the site of the procedure that was performed 2 hours ago.  There was some concern for DIC given his persistent bleeding, previous low platelet count, and presence of multisystem dysfunction.  Fibrinogen and D-dimer were added to previous labs drawn after his procedure.  By this time patient had had 1 unit of blood running for his drop in hemoglobin from 7.6 to below 7.  -Incidentally, the patient converted out of his tachyarrhythmia into normal sinus rhythm spontaneously before IV metoprolol could be given.  -Discussed with Dr. Lake Bells in the ICU about the likelihood of DIC, and suggestions for treatment.  He favored fresh frozen plasma over platelets given the information I provided him.  He stated he will have someone from the ICU come evaluate the patient for appropriateness for the ICU floor.  O: BP (!) 159/104   Pulse 86   Temp 97.9 F (36.6 C) (Oral)   Resp (!) 23   SpO2 100%   General: Patient tired but arousable.  Appear to be more tired during subsequent visits then when I first saw him this morning.  Generally ill-appearing CV: Regular rhythm, tachycardic rate initially.  On most recent evaluation patient was regular rhythm and rate. Skin: Patient has tunneled HD catheter placed in the right anterior chest.  Gauze surrounding the site is soaked in blood and gown near the area also  has blood on it.  A/P: This patient, although he complains very little and tries to downplay his condition, is very ill with multiple significant organ system dysfunction including complete renal failure with anuria, severely elevated blood pressure, thoracic back pain likely due to acute cholecystitis, anemia requiring transfusion, and thrombocytopenia with persistent post procedure bleeding concerning for DIC. -I have talked with Dr. Lake Bells, critical care team will evaluate for appropriateness of admission to the ICU -1 unit of fresh frozen plasma added to his 1 unit of whole blood that is currently being given.  Elevated PTT and D-dimer of greater than 4 may represent DIC. -Cardiology consulted and patient is awaiting echo.  Patient has thankfully converted to normal sinus rhythm spontaneously, but may develop tachyarrhythmia again. -Patient is currently awaiting dialysis for his anuric renal failure that is scheduled for sometime this evening  Benay Pike, MD 11/20/2019, 1:17 PM PGY-3, [facility] pager [phone]

## 2019-11-20 NOTE — Consult Note (Signed)
Chief Complaint: AKI. Request is for tunneled HD catheter placement  Referring Physician(s): Dr. Lenna Sciara Pay  Supervising Physician: Sandi Mariscal  Patient Status: [facility] - ED  History of Present Illness: Samuel Burns is a 34 y.o. male History of HTN, asthma. Presented to the ED with nausea, vomiting  and right thoracic pain X 2 days. Found to be in hypertension crisis and acute renal failure. Team is requesting a HD catheter placement for dialysis access.   Past Medical History:  Diagnosis Date  . Asthma     History reviewed. No pertinent surgical history.  Allergies: Patient has no known allergies.  Medications: Prior to Admission medications   Medication Sig Start Date End Date Taking? Authorizing Provider  acetaminophen (TYLENOL) 500 MG tablet Take 500-1,000 mg by mouth every 6 (six) hours as needed (for pain).   Yes [provider]  amLODipine (NORVASC) 10 MG tablet Take 1 tablet (10 mg total) by mouth daily. Patient not taking: Reported on 11/19/2019 04/08/17   Jaynee Eagles, PA-C  atorvastatin (LIPITOR) 10 MG tablet Take 1 tablet (10 mg total) by mouth daily. Patient not taking: Reported on 11/19/2019 04/15/17   Jaynee Eagles, PA-C  cetirizine-pseudoephedrine (ZYRTEC-D) 5-120 MG tablet Take 1 tablet by mouth daily. Patient not taking: Reported on 11/19/2019 03/23/17   Frederica Kuster, PA-C  losartan (COZAAR) 100 MG tablet Take 1 tablet (100 mg total) by mouth daily. Patient not taking: Reported on 11/19/2019 04/08/17   Jaynee Eagles, PA-C     Family History  Problem Relation Age of Onset  . Hypertension Maternal Grandmother     Social History   Socioeconomic History  . Marital status: Married    Spouse name: Not on file  . Number of children: Not on file  . Years of education: Not on file  . Highest education level: Not on file  Occupational History  . Not on file  Tobacco Use  . Smoking status: Never Smoker  . Smokeless tobacco: Never Used  Substance and Sexual  Activity  . Alcohol use: No  . Drug use: Yes    Types: Marijuana  . Sexual activity: Not on file  Other Topics Concern  . Not on file  Social History Narrative  . Not on file   Social Determinants of Health   Financial Resource Strain:   . Difficulty of Paying Living Expenses: Not on file  Food Insecurity:   . Worried About Charity fundraiser in the Last Year: Not on file  . Ran Out of Food in the Last Year: Not on file  Transportation Needs:   . Lack of Transportation (Medical): Not on file  . Lack of Transportation (Non-Medical): Not on file  Physical Activity:   . Days of Exercise per Week: Not on file  . Minutes of Exercise per Session: Not on file  Stress:   . Feeling of Stress : Not on file  Social Connections:   . Frequency of Communication with Friends and Family: Not on file  . Frequency of Social Gatherings with Friends and Family: Not on file  . Attends Religious Services: Not on file  . Active Member of Clubs or Organizations: Not on file  . Attends Archivist Meetings: Not on file  . Marital Status: Not on file     Review of Systems: A 12 point ROS discussed and pertinent positives are indicated in the HPI above.  All other systems are negative.  Review of Systems  Vital Signs: BP Marland Kitchen)  176/105   Pulse 89   Temp 97.9 F (36.6 C) (Oral)   Resp (!) 29   SpO2 100%   Physical Exam  Imaging: CT ABDOMEN PELVIS WO CONTRAST  Result Date: 11/19/2019 CLINICAL DATA:  Right-sided thoracic back pain, nausea, vomiting, diarrhea, aortic dissection suspected EXAM: CT CHEST, ABDOMEN AND PELVIS WITHOUT CONTRAST TECHNIQUE: Multidetector CT imaging of the chest, abdomen and pelvis was performed following the standard protocol without IV contrast. COMPARISON:  None. FINDINGS: CT CHEST FINDINGS Cardiovascular: No significant vascular findings. Cardiomegaly. No pericardial effusion. Mediastinum/Nodes: No enlarged mediastinal, hilar, or axillary lymph nodes. Thyroid  gland, trachea, and esophagus demonstrate no significant findings. Lungs/Pleura: Dependent bibasilar heterogeneous atelectasis or consolidation. No pleural effusion or pneumothorax. Musculoskeletal: No chest wall mass or suspicious bone lesions identified. CT ABDOMEN PELVIS FINDINGS Hepatobiliary: No solid liver abnormality is seen. The gallbladder is distended with wall thickening (series 3, image 77). No radiopaque calculi identified. No biliary ductal dilatation Pancreas: Unremarkable. No pancreatic ductal dilatation or surrounding inflammatory changes. Spleen: Normal in size without significant abnormality. Adrenals/Urinary Tract: Adrenal glands are unremarkable. Kidneys are normal, without renal calculi, solid lesion, or hydronephrosis. Bladder is unremarkable. Stomach/Bowel: Stomach is within normal limits. Appendix appears normal. No evidence of bowel wall thickening, distention, or inflammatory changes. Vascular/Lymphatic: No significant vascular findings are present. No enlarged abdominal or pelvic lymph nodes. Reproductive: No mass or other abnormality. Other: No abdominal wall hernia or abnormality. No abdominopelvic ascites. Musculoskeletal: No acute or significant osseous findings. IMPRESSION: 1. The gallbladder is distended with wall thickening. No radiopaque calculi identified. No biliary ductal dilatation. Findings are concerning for acute cholecystitis. 2. Predominantly dependent bibasilar heterogeneous atelectasis or consolidation. Findings are concerning for infection or aspiration. 3. No obvious evidence of aortic aneurysm or dissection, however please note that noncontrast CT is very limited for the evaluation of aortic dissection and other acute aortic pathology. 4. Cardiomegaly. Electronically Signed   By: Eddie Candle M.D.   On: 11/19/2019 14:38   DG Chest 2 View  Result Date: 11/19/2019 CLINICAL DATA:  Right back pain EXAM: CHEST - 2 VIEW COMPARISON:  None. FINDINGS: Lungs are clear.  No  pleural effusion or pneumothorax. Cardiomegaly. Visualized osseous structures are within normal limits. IMPRESSION: No evidence of acute cardiopulmonary disease. Electronically Signed   By: Julian Hy M.D.   On: 11/19/2019 13:19   CT CHEST WO CONTRAST  Result Date: 11/19/2019 CLINICAL DATA:  Right-sided thoracic back pain, nausea, vomiting, diarrhea, aortic dissection suspected EXAM: CT CHEST, ABDOMEN AND PELVIS WITHOUT CONTRAST TECHNIQUE: Multidetector CT imaging of the chest, abdomen and pelvis was performed following the standard protocol without IV contrast. COMPARISON:  None. FINDINGS: CT CHEST FINDINGS Cardiovascular: No significant vascular findings. Cardiomegaly. No pericardial effusion. Mediastinum/Nodes: No enlarged mediastinal, hilar, or axillary lymph nodes. Thyroid gland, trachea, and esophagus demonstrate no significant findings. Lungs/Pleura: Dependent bibasilar heterogeneous atelectasis or consolidation. No pleural effusion or pneumothorax. Musculoskeletal: No chest wall mass or suspicious bone lesions identified. CT ABDOMEN PELVIS FINDINGS Hepatobiliary: No solid liver abnormality is seen. The gallbladder is distended with wall thickening (series 3, image 77). No radiopaque calculi identified. No biliary ductal dilatation Pancreas: Unremarkable. No pancreatic ductal dilatation or surrounding inflammatory changes. Spleen: Normal in size without significant abnormality. Adrenals/Urinary Tract: Adrenal glands are unremarkable. Kidneys are normal, without renal calculi, solid lesion, or hydronephrosis. Bladder is unremarkable. Stomach/Bowel: Stomach is within normal limits. Appendix appears normal. No evidence of bowel wall thickening, distention, or inflammatory changes. Vascular/Lymphatic: No significant vascular findings are  present. No enlarged abdominal or pelvic lymph nodes. Reproductive: No mass or other abnormality. Other: No abdominal wall hernia or abnormality. No abdominopelvic  ascites. Musculoskeletal: No acute or significant osseous findings. IMPRESSION: 1. The gallbladder is distended with wall thickening. No radiopaque calculi identified. No biliary ductal dilatation. Findings are concerning for acute cholecystitis. 2. Predominantly dependent bibasilar heterogeneous atelectasis or consolidation. Findings are concerning for infection or aspiration. 3. No obvious evidence of aortic aneurysm or dissection, however please note that noncontrast CT is very limited for the evaluation of aortic dissection and other acute aortic pathology. 4. Cardiomegaly. Electronically Signed   By: Eddie Candle M.D.   On: 11/19/2019 14:38   US Aorta  Result Date: 11/19/2019 CLINICAL DATA:  34 year old male with hypertensive emergency and back pain for 3 days. EXAM: ULTRASOUND OF ABDOMINAL AORTA TECHNIQUE: Ultrasound examination of the abdominal aorta and proximal common iliac arteries was performed to evaluate for aneurysm. Additional color and Doppler images of the distal aorta were obtained to document patency. COMPARISON:  None. FINDINGS: Abdominal aortic measurements as follows: Proximal:  2.4 x 2.6 cm (AP by transverse) Mid:         2.0 x 1.9 cm Distal:      1.8 x 2.0 cm Patent: Yes, peak systolic velocity is 092 cm/s Aortoiliac bifurcation and proximal iliac arteries are obscured by overlying bowel gas. IMPRESSION: Negative for abdominal aortic aneurysm. Electronically Signed   By: Genevie Ann M.D.   On: 11/19/2019 13:29   US Abdomen Limited RUQ  Result Date: 11/19/2019 CLINICAL DATA:  Gallstones EXAM: ULTRASOUND ABDOMEN LIMITED RIGHT UPPER QUADRANT COMPARISON:  CT from same day FINDINGS: Gallbladder: There is gallbladder sludge within a distended gallbladder. There is mild gallbladder wall thickening measuring up to 3.5 mm. There is pericholecystic free fluid. No distinct gallstones are identified. The sonographic Percell Miller sign was reported as negative. Common bile duct: Diameter: 3 mm Liver: The  liver parenchyma is coarsened and heterogeneous. The portal triads appear to be echogenic. Portal vein is patent on color Doppler imaging with normal direction of blood flow towards the liver. Other: The right kidney is echogenic. IMPRESSION: 1. Findings are equivocal for acute cholecystitis. While there is gallbladder wall thickening with pericholecystic free fluid in the presence of gallbladder sludge, the sonographic Murphy sign is negative. Consider further evaluation with HIDA scan as clinically indicated. 2. Coarsened and heterogeneous appearance of the liver with somewhat echogenic portal triads. Findings may indicate underlying hepatocellular disease or acute hepatitis. 3. Echogenic right kidney concerning for medical renal disease. Electronically Signed   By: Constance Holster M.D.   On: 11/19/2019 20:19    Labs:  CBC: Recent Labs    11/19/19 0906 11/20/19 0428  WBC 13.7* 9.5  HGB 7.3* 6.4*  HCT 21.8* 19.1*  PLT 58* 67*    COAGS: No results for input(s): INR, APTT in the last 8760 hours.  BMP: Recent Labs    11/19/19 0906 11/20/19 0428  NA 135 134*  K 3.7 3.7  CL 89* 92*  CO2 15* 13*  GLUCOSE 131* 109*  BUN 257* 266*  CALCIUM 9.5 8.7*  CREATININE 34.21* 35.25*  GFRNONAA 1* 1*  GFRAA 2* 2*    LIVER FUNCTION TESTS: Recent Labs    11/19/19 0906 11/20/19 0428  BILITOT 2.3*  --   AST 13*  --   ALT 21  --   ALKPHOS 232*  --   PROT 7.9  --   ALBUMIN 4.5 3.6    Assessment  and Plan:  34 y.o. outpatient (In the ED). History of HTN, asthma. Presented to the ED with nausea, vomiting  and right thoracic pain X 2 days. Found to be in hypertension crisis and acute renal failure. Team is requesting a HD catheter placement for dialysis access.   CT Chest from 10.3.21 shows RIJ is accessible. No lines or drains. Troponin 579, BUN 266, Cr 35.25, AST 13, Total Bilirubin 2.3, BNP 1267, Hgb 6.4. patient to receive blood transfusion in IR during procedure. All other labs and   medications are within acceptable parameters. NKDA. Patient is NPO. Patient is on subcutaneous prophylactic dose of heparin last dose given on 10.4.21.  Risks and benefits discussed with the patient including, but not limited to bleeding, infection, vascular injury, pneumothorax which may require chest tube placement, air embolism or even death  All of the patient's questions were answered, patient is agreeable to proceed. Consent signed and in chart.   Thank you for this interesting consult.  I greatly enjoyed meeting Samuel Burns and look forward to participating in their care.  A copy of this report was sent to the requesting provider on this date.  Electronically Signed: Jacqualine Mau, NP 11/20/2019, 9:22 AM   I spent a total of 40 Minutes    in face to face in clinical consultation, greater than 50% of which was counseling/coordinating care for HD catheter placement.

## 2019-11-20 NOTE — Progress Notes (Signed)
FPTS Interim Progress Note  Repeat troponin elevated from 1200>1300>1400>1700. Continues to deny chest pain or SOB. Tachycardic but otherwise hemodynamically stable. EKG ordered to trend and unchanged (see Media). Touched base with cardiology who did not recommend any further interventions at this time given lack of symptoms. Recommended q4-6 hour troponins and only EKG if becomes symptomatic or change in clinical status.  Danna Hefty, DO 11/20/2019, 11:51 PM PGY-3, [place] Service pager [phone]

## 2019-11-20 NOTE — Progress Notes (Signed)
  Called by nurse for continued bleeding after HD cath placement.  Using sterile technique, 1% lidocaine was injected for anesthesia. A 0-Prolene was used circumferentially around the catheter in a figure 8 fashion for hemostasis.  No further bleeding.  New Biopatch placed and new sterile dressing placed.  Murrell Redden PA-C 11/20/2019 4:22 PM   '

## 2019-11-20 NOTE — Consult Note (Addendum)
Cardiology Consultation:   Patient ID: Samuel Burns MRN: [id number]; DOB: Jan 24, 1986  Admit date: 11/19/2019 Date of Consult: 11/20/2019  Primary Care Provider: Patient, No Pcp Per [place] Cardiologist: No primary care provider on file. new Dr. Ellyn Hack [facility] Electrophysiologist:  None    Patient Profile:   Samuel Burns is a 34 y.o. male with a hx of asthma, HTN now admitted 11/19/19 with thracic back pain for 3 days, Nausea for 2 weeks, vomiting and diarrhea and no BP meds in 3 weeks, BP on arrival 248/137 P 94, R 16 a febrile, Cr 34.21 BUN 257  who is being seen today for the evaluation of atrial flutter at the request of Dr. Ardelia Mems.  History of Present Illness:   Mr. Limas with hx as above and ut of meds for 3 weeks, began with nausea and progressed to vomiting in AKI, he had some thoracic back pain, and was hypertensive.    Labs on arrival BUN 257, Cr 34, alk phos 232, lipase 169,  Today Na 134, K+ 3.7, BUN 266, Cr 35.25 gap 29, phos >30 GFR 2   WBC 13.7, Hgb 7.3, plts 58  Today plts 67 BNP 1267, hs troponin 593, 579, 591 then 1250  T chol 186, LDL 115, TG 207 HDL 30 Iron 40, ferritin 1046 Vit B12 1290 DDimer 4.07  A1C 3.5  Abd U/s   IMPRESSION: 1. Findings are equivocal for acute cholecystitis. While there is gallbladder wall thickening with pericholecystic free fluid in the presence of gallbladder sludge, the sonographic Murphy sign is negative. Consider further evaluation with HIDA scan as clinically indicated. 2. Coarsened and heterogeneous appearance of the liver with somewhat echogenic portal triads. Findings may indicate underlying hepatocellular disease or acute hepatitis. 3. Echogenic right kidney concerning for medical renal disease.  Neg aortic aneurysm of abdomina aorta.   EKG:  The EKG was personally reviewed and demonstrates:  No old to compare, SR at 58 with LVH and no acute ST changes  Today was in a flutter HR was 140s.  with  RVR and LVH. At 1046.  Telemetry:  Telemetry was personally reviewed and demonstrates:  SR in ER now a flutter   CXR with cardiomegaly also on CT chest .  With a flutter, metoprolol 5 mg IV was to be given- but pt converted prior to that time.   Now in SR Pt rec'd 1 unit PRBCs.  And 1 unit of FFP, concern for DIC  Plan for dialysis Has rec'd amlodipine, 10 mg, 2 doses since admit, coreg 3.125 BID, on ABX  BP now 159/103, P 80s and resp 20-23   Afebrile.  Echo pending No chest pain no awareness of racing HR, he is emotional and stunned about condition.    Past Medical History:  Diagnosis Date  . Asthma     Past Surgical History:  Procedure Laterality Date  . IR FLUORO GUIDE CV LINE RIGHT  11/20/2019  . IR US GUIDE VASC ACCESS RIGHT  11/20/2019     Home Medications:   NO MEDICATIONS FOR 3 WEEKS and maybe not consistent prior to that  Prior to Admission medications   Medication Sig Start Date End Date Taking? Authorizing Provider  acetaminophen (TYLENOL) 500 MG tablet Take 500-1,000 mg by mouth every 6 (six) hours as needed (for pain).   Yes [provider]  amLODipine (NORVASC) 10 MG tablet Take 1 tablet (10 mg total) by mouth daily. Patient not taking: Reported on 11/19/2019 04/08/17   Jaynee Eagles,  PA-C  atorvastatin (LIPITOR) 10 MG tablet Take 1 tablet (10 mg total) by mouth daily. Patient not taking: Reported on 11/19/2019 04/15/17   Jaynee Eagles, PA-C  cetirizine-pseudoephedrine (ZYRTEC-D) 5-120 MG tablet Take 1 tablet by mouth daily. Patient not taking: Reported on 11/19/2019 03/23/17   Frederica Kuster, PA-C  losartan (COZAAR) 100 MG tablet Take 1 tablet (100 mg total) by mouth daily. Patient not taking: Reported on 11/19/2019 04/08/17   Jaynee Eagles, PA-C    Inpatient Medications: Scheduled Meds: . sodium chloride   Intravenous Once  . amLODipine  10 mg Oral Daily  . carvedilol  3.125 mg Oral BID WC  . Chlorhexidine Gluconate Cloth  6 each Topical Q0600  . [START ON  11/21/2019] Chlorhexidine Gluconate Cloth  6 each Topical Q0600  . fentaNYL      . heparin sodium (porcine)      . lidocaine (PF)      . lidocaine (PF)      . melatonin  5 mg Oral QHS  . midazolam      . sodium bicarbonate  1,300 mg Oral TID   Continuous Infusions: . piperacillin-tazobactam (ZOSYN)  IV Stopped (11/20/19 1035)   PRN Meds: acetaminophen **OR** acetaminophen, hydrALAZINE, HYDROmorphone (DILAUDID) injection, labetalol, polyethylene glycol  Allergies:   No Known Allergies  Social History:   Social History   Socioeconomic History  . Marital status: Married    Spouse name: Not on file  . Number of children: Not on file  . Years of education: Not on file  . Highest education level: Not on file  Occupational History  . Not on file  Tobacco Use  . Smoking status: Never Smoker  . Smokeless tobacco: Never Used  Substance and Sexual Activity  . Alcohol use: No  . Drug use: Yes    Types: Marijuana  . Sexual activity: Not on file  Other Topics Concern  . Not on file  Social History Narrative  . Not on file   Social Determinants of Health   Financial Resource Strain:   . Difficulty of Paying Living Expenses: Not on file  Food Insecurity:   . Worried About Charity fundraiser in the Last Year: Not on file  . Ran Out of Food in the Last Year: Not on file  Transportation Needs:   . Lack of Transportation (Medical): Not on file  . Lack of Transportation (Non-Medical): Not on file  Physical Activity:   . Days of Exercise per Week: Not on file  . Minutes of Exercise per Session: Not on file  Stress:   . Feeling of Stress : Not on file  Social Connections:   . Frequency of Communication with Friends and Family: Not on file  . Frequency of Social Gatherings with Friends and Family: Not on file  . Attends Religious Services: Not on file  . Active Member of Clubs or Organizations: Not on file  . Attends Archivist Meetings: Not on file  . Marital Status:  Not on file  Intimate Partner Violence:   . Fear of Current or Ex-Partner: Not on file  . Emotionally Abused: Not on file  . Physically Abused: Not on file  . Sexually Abused: Not on file    Family History:    Family History  Problem Relation Age of Onset  . Hypertension Maternal Grandmother      ROS:  Please see the history of present illness.  General:no colds or fevers, no weight changes Skin:no rashes or  ulcers HEENT:no blurred vision, no congestion CV:see HPI PUL:see HPI. Hx asthma GI:+ diarrhea and vomiting prior to admit no constipation or melena, no indigestion GU:no hematuria, no dysuria MS:no joint pain, no claudication Neuro:no syncope, no lightheadedness Endo:no diabetes, no thyroid disease  All other ROS reviewed and negative.     Physical Exam/Data:   Vitals:   11/20/19 1300 11/20/19 1330 11/20/19 1411 11/20/19 1517  BP: (!) 159/104 (!) 154/102 (!) 159/103 (!) 163/111  Pulse: 86 84 84 (!) 137  Resp: (!) 23 (!) '21 20 20  ' Temp:   98.4 F (36.9 C) 97.8 F (36.6 C)  TempSrc:   Oral Oral  SpO2: 100% 100% 100%   Height:    '5\' 7"'  (1.702 m)    Intake/Output Summary (Last 24 hours) at 11/20/2019 1535 Last data filed at 11/20/2019 1410 Gross per 24 hour  Intake 365 ml  Output --  Net 365 ml   Last 3 Weights 04/08/2017 03/22/2017  Weight (lbs) 239 lb 6.4 oz 217 lb  Weight (kg) 108.591 kg 98.431 kg     Body mass index is 37.5 kg/m.  General:  Well nourished, well developed, in no acute distress except emotional  HEENT: normal Lymph: no adenopathy Neck: no JVD Endocrine:  No thryomegaly Vascular: No carotid bruits; peal pulses 3+ bilaterally without bruits  Cardiac:  normal S1, S2; RRR; rapid no murmur gallup rub or click Lungs:  Rales in bases to auscultation bilaterally, no wheezing, rhonchi   Abd: soft, rt upper quad tendernes, no hepatomegaly  Ext: no edema Musculoskeletal:  No deformities, BUE and BLE strength normal and equal Skin: warm and dry   Neuro:  Alert and oriented X 3 MAE follows commands, no focal abnormalities noted Psych:  Normal affect    Relevant CV Studies: Echo pending   Laboratory Data:  High Sensitivity Troponin:   Recent Labs  Lab 11/19/19 1253 11/19/19 1611 11/19/19 1812 11/20/19 1056  TROPONINIHS 592* 579* 591* 1,250*     Chemistry Recent Labs  Lab 11/19/19 0906 11/20/19 0428  NA 135 134*  K 3.7 3.7  CL 89* 92*  CO2 15* 13*  GLUCOSE 131* 109*  BUN 257* 266*  CREATININE 34.21* 35.25*  CALCIUM 9.5 8.7*  GFRNONAA 1* 1*  GFRAA 2* 2*  ANIONGAP 31* 29*    Recent Labs  Lab 11/19/19 0906 11/20/19 0428  PROT 7.9  --   ALBUMIN 4.5 3.6  AST 13*  --   ALT 21  --   ALKPHOS 232*  --   BILITOT 2.3*  --    Hematology Recent Labs  Lab 11/19/19 0906 11/20/19 0428  WBC 13.7* 9.5  RBC 2.44* 2.10*  2.12*  HGB 7.3* 6.4*  HCT 21.8* 19.1*  MCV 89.3 91.0  MCH 29.9 30.5  MCHC 33.5 33.5  RDW 18.8* 18.5*  PLT 58* 67*   BNP Recent Labs  Lab 11/19/19 1253  BNP 1,267.0*    DDimer  Recent Labs  Lab 11/20/19 1105  DDIMER 4.07*     Radiology/Studies:  CT ABDOMEN PELVIS WO CONTRAST  Result Date: 11/19/2019 CLINICAL DATA:  Right-sided thoracic back pain, nausea, vomiting, diarrhea, aortic dissection suspected EXAM: CT CHEST, ABDOMEN AND PELVIS WITHOUT CONTRAST TECHNIQUE: Multidetector CT imaging of the chest, abdomen and pelvis was performed following the standard protocol without IV contrast. COMPARISON:  None. FINDINGS: CT CHEST FINDINGS Cardiovascular: No significant vascular findings. Cardiomegaly. No pericardial effusion. Mediastinum/Nodes: No enlarged mediastinal, hilar, or axillary lymph nodes. Thyroid gland, trachea, and esophagus  demonstrate no significant findings. Lungs/Pleura: Dependent bibasilar heterogeneous atelectasis or consolidation. No pleural effusion or pneumothorax. Musculoskeletal: No chest wall mass or suspicious bone lesions identified. CT ABDOMEN PELVIS FINDINGS  Hepatobiliary: No solid liver abnormality is seen. The gallbladder is distended with wall thickening (series 3, image 77). No radiopaque calculi identified. No biliary ductal dilatation Pancreas: Unremarkable. No pancreatic ductal dilatation or surrounding inflammatory changes. Spleen: Normal in size without significant abnormality. Adrenals/Urinary Tract: Adrenal glands are unremarkable. Kidneys are normal, without renal calculi, solid lesion, or hydronephrosis. Bladder is unremarkable. Stomach/Bowel: Stomach is within normal limits. Appendix appears normal. No evidence of bowel wall thickening, distention, or inflammatory changes. Vascular/Lymphatic: No significant vascular findings are present. No enlarged abdominal or pelvic lymph nodes. Reproductive: No mass or other abnormality. Other: No abdominal wall hernia or abnormality. No abdominopelvic ascites. Musculoskeletal: No acute or significant osseous findings. IMPRESSION: 1. The gallbladder is distended with wall thickening. No radiopaque calculi identified. No biliary ductal dilatation. Findings are concerning for acute cholecystitis. 2. Predominantly dependent bibasilar heterogeneous atelectasis or consolidation. Findings are concerning for infection or aspiration. 3. No obvious evidence of aortic aneurysm or dissection, however please note that noncontrast CT is very limited for the evaluation of aortic dissection and other acute aortic pathology. 4. Cardiomegaly. Electronically Signed   By: Eddie Candle M.D.   On: 11/19/2019 14:38   DG Chest 2 View  Result Date: 11/19/2019 CLINICAL DATA:  Right back pain EXAM: CHEST - 2 VIEW COMPARISON:  None. FINDINGS: Lungs are clear.  No pleural effusion or pneumothorax. Cardiomegaly. Visualized osseous structures are within normal limits. IMPRESSION: No evidence of acute cardiopulmonary disease. Electronically Signed   By: Julian Hy M.D.   On: 11/19/2019 13:19   CT CHEST WO CONTRAST  Result Date:  11/19/2019 CLINICAL DATA:  Right-sided thoracic back pain, nausea, vomiting, diarrhea, aortic dissection suspected EXAM: CT CHEST, ABDOMEN AND PELVIS WITHOUT CONTRAST TECHNIQUE: Multidetector CT imaging of the chest, abdomen and pelvis was performed following the standard protocol without IV contrast. COMPARISON:  None. FINDINGS: CT CHEST FINDINGS Cardiovascular: No significant vascular findings. Cardiomegaly. No pericardial effusion. Mediastinum/Nodes: No enlarged mediastinal, hilar, or axillary lymph nodes. Thyroid gland, trachea, and esophagus demonstrate no significant findings. Lungs/Pleura: Dependent bibasilar heterogeneous atelectasis or consolidation. No pleural effusion or pneumothorax. Musculoskeletal: No chest wall mass or suspicious bone lesions identified. CT ABDOMEN PELVIS FINDINGS Hepatobiliary: No solid liver abnormality is seen. The gallbladder is distended with wall thickening (series 3, image 77). No radiopaque calculi identified. No biliary ductal dilatation Pancreas: Unremarkable. No pancreatic ductal dilatation or surrounding inflammatory changes. Spleen: Normal in size without significant abnormality. Adrenals/Urinary Tract: Adrenal glands are unremarkable. Kidneys are normal, without renal calculi, solid lesion, or hydronephrosis. Bladder is unremarkable. Stomach/Bowel: Stomach is within normal limits. Appendix appears normal. No evidence of bowel wall thickening, distention, or inflammatory changes. Vascular/Lymphatic: No significant vascular findings are present. No enlarged abdominal or pelvic lymph nodes. Reproductive: No mass or other abnormality. Other: No abdominal wall hernia or abnormality. No abdominopelvic ascites. Musculoskeletal: No acute or significant osseous findings. IMPRESSION: 1. The gallbladder is distended with wall thickening. No radiopaque calculi identified. No biliary ductal dilatation. Findings are concerning for acute cholecystitis. 2. Predominantly dependent  bibasilar heterogeneous atelectasis or consolidation. Findings are concerning for infection or aspiration. 3. No obvious evidence of aortic aneurysm or dissection, however please note that noncontrast CT is very limited for the evaluation of aortic dissection and other acute aortic pathology. 4. Cardiomegaly. Electronically Signed   By: Cristie Hem  Laqueta Carina M.D.   On: 11/19/2019 14:38   US Aorta  Result Date: 11/19/2019 CLINICAL DATA:  34 year old male with hypertensive emergency and back pain for 3 days. EXAM: ULTRASOUND OF ABDOMINAL AORTA TECHNIQUE: Ultrasound examination of the abdominal aorta and proximal common iliac arteries was performed to evaluate for aneurysm. Additional color and Doppler images of the distal aorta were obtained to document patency. COMPARISON:  None. FINDINGS: Abdominal aortic measurements as follows: Proximal:  2.4 x 2.6 cm (AP by transverse) Mid:         2.0 x 1.9 cm Distal:      1.8 x 2.0 cm Patent: Yes, peak systolic velocity is 338 cm/s Aortoiliac bifurcation and proximal iliac arteries are obscured by overlying bowel gas. IMPRESSION: Negative for abdominal aortic aneurysm. Electronically Signed   By: Genevie Ann M.D.   On: 11/19/2019 13:29   IR Fluoro Guide CV Line Right  Result Date: 11/20/2019 INDICATION: In need of placement of a tunneled hemodialysis catheter for the initiation of dialysis. EXAM: TUNNELED CENTRAL VENOUS HEMODIALYSIS CATHETER PLACEMENT WITH ULTRASOUND AND FLUOROSCOPIC GUIDANCE MEDICATIONS: Zosyn 3.375 g IV. The antibiotic was given in an appropriate time interval prior to skin puncture. ANESTHESIA/SEDATION: Moderate (conscious) sedation was employed during this procedure. A total of Versed 1 mg and Fentanyl 50 mcg was administered intravenously. Moderate Sedation Time: 15 minutes. The patient's level of consciousness and vital signs were monitored continuously by radiology nursing throughout the procedure under my direct supervision. FLUOROSCOPY TIME:  24 seconds (5  mGy). COMPLICATIONS: None immediate. PROCEDURE: Informed written consent was obtained from the patient after a discussion of the risks, benefits, and alternatives to treatment. Questions regarding the procedure were encouraged and answered. The right neck and chest were prepped with chlorhexidine in a sterile fashion, and a sterile drape was applied covering the operative field. Maximum barrier sterile technique with sterile gowns and gloves were used for the procedure. A timeout was performed prior to the initiation of the procedure. After creating a small venotomy incision, a micropuncture kit was utilized to access the internal jugular vein. Real-time ultrasound guidance was utilized for vascular access including the acquisition of a permanent ultrasound image documenting patency of the accessed vessel. The microwire was utilized to measure appropriate catheter length. A stiff Glidewire was advanced to the level of the IVC and the micropuncture sheath was exchanged for a peel-away sheath. A palindrome tunneled hemodialysis catheter measuring 23 cm from tip to cuff was tunneled in a retrograde fashion from the anterior chest wall to the venotomy incision. The catheter was then placed through the peel-away sheath with tips ultimately positioned within the superior aspect of the right atrium. Final catheter positioning was confirmed and documented with a spot radiographic image. The catheter aspirates and flushes normally. The catheter was flushed with appropriate volume heparin dwells. The catheter exit site was secured with a 0-Prolene retention suture. The venotomy incision was closed with an interrupted 4-0 Vicryl, Dermabond and Steri-strips. Dressings were applied. The patient tolerated the procedure well without immediate post procedural complication. IMPRESSION: Successful placement of 23 cm tip to cuff tunneled hemodialysis catheter via the right internal jugular vein with tips terminating within the  superior aspect of the right atrium. The catheter is ready for immediate use. Electronically Signed   By: Sandi Mariscal M.D.   On: 11/20/2019 10:53   IR US Guide Vasc Access Right  Result Date: 11/20/2019 INDICATION: In need of placement of a tunneled hemodialysis catheter for the initiation of dialysis. EXAM:  TUNNELED CENTRAL VENOUS HEMODIALYSIS CATHETER PLACEMENT WITH ULTRASOUND AND FLUOROSCOPIC GUIDANCE MEDICATIONS: Zosyn 3.375 g IV. The antibiotic was given in an appropriate time interval prior to skin puncture. ANESTHESIA/SEDATION: Moderate (conscious) sedation was employed during this procedure. A total of Versed 1 mg and Fentanyl 50 mcg was administered intravenously. Moderate Sedation Time: 15 minutes. The patient's level of consciousness and vital signs were monitored continuously by radiology nursing throughout the procedure under my direct supervision. FLUOROSCOPY TIME:  24 seconds (5 mGy). COMPLICATIONS: None immediate. PROCEDURE: Informed written consent was obtained from the patient after a discussion of the risks, benefits, and alternatives to treatment. Questions regarding the procedure were encouraged and answered. The right neck and chest were prepped with chlorhexidine in a sterile fashion, and a sterile drape was applied covering the operative field. Maximum barrier sterile technique with sterile gowns and gloves were used for the procedure. A timeout was performed prior to the initiation of the procedure. After creating a small venotomy incision, a micropuncture kit was utilized to access the internal jugular vein. Real-time ultrasound guidance was utilized for vascular access including the acquisition of a permanent ultrasound image documenting patency of the accessed vessel. The microwire was utilized to measure appropriate catheter length. A stiff Glidewire was advanced to the level of the IVC and the micropuncture sheath was exchanged for a peel-away sheath. A palindrome tunneled  hemodialysis catheter measuring 23 cm from tip to cuff was tunneled in a retrograde fashion from the anterior chest wall to the venotomy incision. The catheter was then placed through the peel-away sheath with tips ultimately positioned within the superior aspect of the right atrium. Final catheter positioning was confirmed and documented with a spot radiographic image. The catheter aspirates and flushes normally. The catheter was flushed with appropriate volume heparin dwells. The catheter exit site was secured with a 0-Prolene retention suture. The venotomy incision was closed with an interrupted 4-0 Vicryl, Dermabond and Steri-strips. Dressings were applied. The patient tolerated the procedure well without immediate post procedural complication. IMPRESSION: Successful placement of 23 cm tip to cuff tunneled hemodialysis catheter via the right internal jugular vein with tips terminating within the superior aspect of the right atrium. The catheter is ready for immediate use. Electronically Signed   By: Sandi Mariscal M.D.   On: 11/20/2019 10:53   US Abdomen Limited RUQ  Result Date: 11/19/2019 CLINICAL DATA:  Gallstones EXAM: ULTRASOUND ABDOMEN LIMITED RIGHT UPPER QUADRANT COMPARISON:  CT from same day FINDINGS: Gallbladder: There is gallbladder sludge within a distended gallbladder. There is mild gallbladder wall thickening measuring up to 3.5 mm. There is pericholecystic free fluid. No distinct gallstones are identified. The sonographic Percell Miller sign was reported as negative. Common bile duct: Diameter: 3 mm Liver: The liver parenchyma is coarsened and heterogeneous. The portal triads appear to be echogenic. Portal vein is patent on color Doppler imaging with normal direction of blood flow towards the liver. Other: The right kidney is echogenic. IMPRESSION: 1. Findings are equivocal for acute cholecystitis. While there is gallbladder wall thickening with pericholecystic free fluid in the presence of gallbladder  sludge, the sonographic Murphy sign is negative. Consider further evaluation with HIDA scan as clinically indicated. 2. Coarsened and heterogeneous appearance of the liver with somewhat echogenic portal triads. Findings may indicate underlying hepatocellular disease or acute hepatitis. 3. Echogenic right kidney concerning for medical renal disease. Electronically Signed   By: Constance Holster M.D.   On: 11/19/2019 20:19        NO chest  pain despite having elevated troponin levels.    Assessment and Plan:   1. Atrial flutter with CHA2DS2VASc of 1 one episode was in SR  Now after transfer to 2W in a flutter with HR 140s, pt unaware of HR.  Will give 5 mg IV lopressor then lopressor 25 mg every 6 hours.  If no improvement then amiodarone would be next medication to consider.  With anemia, would not add anticoagulation.   A flutter Most likely due to AKI.    If we are unable to control with IV Lopressor plus lower dose more frequent oral dosing, we can also use intermittent IV dosing versus short term amiodarone for better rate control.  Would prefer to avoid amiodarone since we are reluctant to use anticoagulation in the setting of anemia.  However, if unable to control with beta-blocker, this would be the best option.  2. HTN emergency  Improving now on nicardipine drip BP 163/111 as well.  Giving 5 mg lopressor now   Has a started on amlodipine as well as carvedilol--for better rate control, I think it is more appropriate to use Lopressor which can allow [place] to titrate up further.  Otherwise will defer management to primary team/nephrology.  3. Elevated troponin and LVH on EKG may be from a flutter, will monitor but may need cardiac cath depending on echo.   Has inverted T waves lateral and ST depression inf yesterday no chest pain.  With LVH would expect T wave inversion.   I do suspect this is demand ischemia related to probably hypertensive heart disease based on LVH on EKG plus atrial  flutter.  He has not had any chest pain.  2D echocardiogram is pending. 4. AKI per renal and IM. Planning for dialysis -catheter was placed.  5. Possible acute cholecystitis. Per Im on abx.   6. Anemia has rec'd 1 unit PRBCs and to have plts.  7. thrombocytopenia per IM      Signed, Cecilie Kicks, NP  11/20/2019 3:35 PM   ATTENDING ATTESTATION  I have seen, examined and evaluated the patient this PM along with Cecilie Kicks, NP.  After reviewing all the available data and chart, we discussed the patients laboratory, study & physical findings as well as symptoms in detail. I agree with her findings, examination as well as impression recommendations as per our discussion.    Attending adjustments noted in italics.   Difficult situation with a gentleman presenting with fulminant renal failure, hypertensive emergency with elevated troponins, LVH on EKG and intermittent atrial flutter with rapid rate.  I do think metoprolol is better for rate control overall, however carvedilol is better for blood pressure.  For now, would like to try IV Lopressor x1 as this worked before to convert back to sinus rhythm.  We can then loaded him with oral Lopressor over the next day or so 25 mg every 6 hours with additional as needed dose if necessary.  If we are unsuccessful controlling his heart rate with beta-blocker, may need to use amiodarone.  Would await 2D echocardiogram hopefully checked when not in atrial flutter going fast to understand what best to do about the elevated troponin which is most likely related to demand ischemia with likely end-stage oliguric/anuric renal failure.  Another compounding issue is the anemia which is probably also related to chronic disease based on labs, but need to exclude any active bleeding before we consider the possibility of DOAC.   We will follow along.    Glenetta Hew,  M.D., M.S. Interventional Cardiologist   Pager # [phone] Phone # [phone] [address] [place], [place] [address]   For questions or updates, please contact [facility] Please consult [email or web address] for contact info under

## 2019-11-20 NOTE — ED Notes (Signed)
Pt transported to IR on monitor with transporter and S/O.

## 2019-11-20 NOTE — Sedation Documentation (Signed)
Patients HR increased at this time. Patient states " he has no palpitations". Dr. Pascal Lux notified of elevated HR. Attempted a vagal response by instructing patient to hold his breath with no success. Patient is alert and oriented at this time. BP elevated. Will continue to monitor closely.

## 2019-11-20 NOTE — Consult Note (Signed)
Reason for Consult: cholecystitis Referring Physician: Chrisandra Netters, M.D.  Samuel Burns is an 34 y.o. male.  HPI: 34 yo male with 1 day of back pain presented to the ED. Pain is constant. It was not related to eating. Nothing made the pain better except pain medication. It was not worse with movement. He was found to be in hypertensive urgency with acute renal failure.  Past Medical History:  Diagnosis Date  . Asthma     History reviewed. No pertinent surgical history.  Family History  Problem Relation Age of Onset  . Hypertension Maternal Grandmother     Social History:  reports that he has never smoked. He has never used smokeless tobacco. He reports current drug use. Drug: Marijuana. He reports that he does not drink alcohol.  Allergies: No Known Allergies  Medications: I have reviewed the patient's current medications.  Results for orders placed or performed during the hospital encounter of 11/19/19 (from the past 48 hour(s))  Respiratory Panel by RT PCR (Flu A&B, Covid) - Nasopharyngeal Swab     Status: None   Collection Time: 11/19/19  9:04 AM   Specimen: Nasopharyngeal Swab  Result Value Ref Range   SARS Coronavirus 2 by RT PCR NEGATIVE NEGATIVE    Comment: (NOTE) SARS-CoV-2 target nucleic acids are NOT DETECTED.  The SARS-CoV-2 RNA is generally detectable in upper respiratoy specimens during the acute phase of infection. The lowest concentration of SARS-CoV-2 viral copies this assay can detect is 131 copies/mL. A negative result does not preclude SARS-Cov-2 infection and should not be used as the sole basis for treatment or other patient management decisions. A negative result may occur with  improper specimen collection/handling, submission of specimen other than nasopharyngeal swab, presence of viral mutation(s) within the areas targeted by this assay, and inadequate number of viral copies (<131 copies/mL). A negative result must be combined with  clinical observations, patient history, and epidemiological information. The expected result is Negative.  Fact Sheet for Patients:  PinkCheek.be  Fact Sheet for Healthcare Providers:  GravelBags.it  This test is no t yet approved or cleared by the [place] FDA and  has been authorized for detection and/or diagnosis of SARS-CoV-2 by FDA under an Emergency Use Authorization (EUA). This EUA will remain  in effect (meaning this test can be used) for the duration of the COVID-19 declaration under Section 564(b)(1) of the Act, 21 U.S.C. section 360bbb-3(b)(1), unless the authorization is terminated or revoked sooner.     Influenza A by PCR NEGATIVE NEGATIVE   Influenza B by PCR NEGATIVE NEGATIVE    Comment: (NOTE) The Xpert Xpress SARS-CoV-2/FLU/RSV assay is intended as an aid in  the diagnosis of influenza from Nasopharyngeal swab specimens and  should not be used as a sole basis for treatment. Nasal washings and  aspirates are unacceptable for Xpert Xpress SARS-CoV-2/FLU/RSV  testing.  Fact Sheet for Patients: PinkCheek.be  Fact Sheet for Healthcare Providers: GravelBags.it  This test is not yet approved or cleared by the [place] FDA and  has been authorized for detection and/or diagnosis of SARS-CoV-2 by  FDA under an Emergency Use Authorization (EUA). This EUA will remain  in effect (meaning this test can be used) for the duration of the  Covid-19 declaration under Section 564(b)(1) of the Act, 21  U.S.C. section 360bbb-3(b)(1), unless the authorization is  terminated or revoked. Performed at [facility], [place] [address] [place], [place] [address]   Lipase, blood  Status: Abnormal   Collection Time: 11/19/19  9:06 AM  Result Value Ref Range   Lipase 169 (H) 11 - 51 U/L    Comment: Performed at [facility], [place] [address]  [place], [place] [address]  Comprehensive metabolic panel     Status: Abnormal   Collection Time: 11/19/19  9:06 AM  Result Value Ref Range   Sodium 135 135 - 145 mmol/L   Potassium 3.7 3.5 - 5.1 mmol/L   Chloride 89 (L) 98 - 111 mmol/L   CO2 15 (L) 22 - 32 mmol/L   Glucose, Bld 131 (H) 70 - 99 mg/dL    Comment: Glucose reference range applies only to samples taken after fasting for at least 8 hours.   BUN 257 (H) 6 - 20 mg/dL   Creatinine, Ser 34.21 (H) 0.61 - 1.24 mg/dL    Comment: RESULTS CONFIRMED BY MANUAL DILUTION   Calcium 9.5 8.9 - 10.3 mg/dL   Total Protein 7.9 6.5 - 8.1 g/dL   Albumin 4.5 3.5 - 5.0 g/dL   AST 13 (L) 15 - 41 U/L   ALT 21 0 - 44 U/L   Alkaline Phosphatase 232 (H) 38 - 126 U/L   Total Bilirubin 2.3 (H) 0.3 - 1.2 mg/dL   GFR calc non Af Amer 1 (L) >60 mL/min   GFR calc Af Amer 2 (L) >60 mL/min   Anion gap 31 (H) 5 - 15    Comment: Performed at [facility], [place] [address], [place], [place] [address]  CBC     Status: Abnormal   Collection Time: 11/19/19  9:06 AM  Result Value Ref Range   WBC 13.7 (H) 4.0 - 10.5 K/uL   RBC 2.44 (L) 4.22 - 5.81 MIL/uL   Hemoglobin 7.3 (L) 13.0 - 17.0 g/dL   HCT 21.8 (L) 39 - 52 %   MCV 89.3 80.0 - 100.0 fL   MCH 29.9 26.0 - 34.0 pg   MCHC 33.5 30.0 - 36.0 g/dL   RDW 18.8 (H) 11.5 - 15.5 %   Platelets 58 (L) 150 - 400 K/uL    Comment: REPEATED TO VERIFY PLATELET COUNT CONFIRMED BY SMEAR SPECIMEN CHECKED FOR CLOTS Immature Platelet Fraction may be clinically indicated, consider ordering this additional test [id number]    nRBC 0.2 0.0 - 0.2 %    Comment: Performed at [facility], [place] [address] [place], [place] [address]  Troponin I (High Sensitivity)     Status: Abnormal   Collection Time: 11/19/19 12:53 PM  Result Value Ref Range   Troponin I (High Sensitivity) 592 (HH) <18 ng/L    Comment: CRITICAL RESULT CALLED TO, READ BACK BY AND VERIFIED WITH: J.[place] RN @ 1400 11/19/2019 BY C.EDENS (NOTE) Elevated  high sensitivity troponin I (hsTnI) values and significant  changes across serial measurements may suggest ACS but many other  chronic and acute conditions are known to elevate hsTnI results.  Refer to the Links section for chest pain algorithms and additional  guidance. Performed at [facility], [place] [address] [place], [place] [address]   Brain natriuretic peptide     Status: Abnormal   Collection Time: 11/19/19 12:53 PM  Result Value Ref Range   B Natriuretic Peptide 1,267.0 (H) 0.0 - 100.0 pg/mL    Comment: Performed at [facility] [address], [place], [place] [address]  Troponin I (High Sensitivity)     Status: Abnormal   Collection Time: 11/19/19  4:11 PM  Result Value Ref Range   Troponin I (High Sensitivity) 579 (HH) <18 ng/L    Comment: CRITICAL VALUE NOTED.  VALUE IS CONSISTENT WITH PREVIOUSLY REPORTED AND CALLED VALUE. (NOTE) Elevated high sensitivity troponin I (hsTnI) values and significant  changes across serial measurements may suggest ACS but many other  chronic and acute conditions are known to elevate hsTnI results.  Refer to the Links section for chest pain algorithms and additional  guidance. Performed at [facility], [place] [address], [place], [place] [address]   HIV Antibody (routine testing w rflx)     Status: None   Collection Time: 11/19/19  6:11 PM  Result Value Ref Range   HIV Screen 4th Generation wRfx Non Reactive Non Reactive    Comment: Performed at [facility], [place] [address], [place], [place] [address]  Troponin I (High Sensitivity)     Status: Abnormal   Collection Time: 11/19/19  6:12 PM  Result Value Ref Range   Troponin I (High Sensitivity) 591 (HH) <18 ng/L    Comment: CRITICAL VALUE NOTED.  VALUE IS CONSISTENT WITH PREVIOUSLY REPORTED AND CALLED VALUE. (NOTE) Elevated high sensitivity troponin I (hsTnI) values and significant  changes across serial measurements may suggest ACS but many other  chronic and  acute conditions are known to elevate hsTnI results.  Refer to the Links section for chest pain algorithms and additional  guidance. Performed at [facility], [place] [address] [place], [place] [address]     CT ABDOMEN PELVIS WO CONTRAST  Result Date: 11/19/2019 CLINICAL DATA:  Right-sided thoracic back pain, nausea, vomiting, diarrhea, aortic dissection suspected EXAM: CT CHEST, ABDOMEN AND PELVIS WITHOUT CONTRAST TECHNIQUE: Multidetector CT imaging of the chest, abdomen and pelvis was performed following the standard protocol without IV contrast. COMPARISON:  None. FINDINGS: CT CHEST FINDINGS Cardiovascular: No significant vascular findings. Cardiomegaly. No pericardial effusion. Mediastinum/Nodes: No enlarged mediastinal, hilar, or axillary lymph nodes. Thyroid gland, trachea, and esophagus demonstrate no significant findings. Lungs/Pleura: Dependent bibasilar heterogeneous atelectasis or consolidation. No pleural effusion or pneumothorax. Musculoskeletal: No chest wall mass or suspicious bone lesions identified. CT ABDOMEN PELVIS FINDINGS Hepatobiliary: No solid liver abnormality is seen. The gallbladder is distended with wall thickening (series 3, image 77). No radiopaque calculi identified. No biliary ductal dilatation Pancreas: Unremarkable. No pancreatic ductal dilatation or surrounding inflammatory changes. Spleen: Normal in size without significant abnormality. Adrenals/Urinary Tract: Adrenal glands are unremarkable. Kidneys are normal, without renal calculi, solid lesion, or hydronephrosis. Bladder is unremarkable. Stomach/Bowel: Stomach is within normal limits. Appendix appears normal. No evidence of bowel wall thickening, distention, or inflammatory changes. Vascular/Lymphatic: No significant vascular findings are present. No enlarged abdominal or pelvic lymph nodes. Reproductive: No mass or other abnormality. Other: No abdominal wall hernia or abnormality. No abdominopelvic ascites.  Musculoskeletal: No acute or significant osseous findings. IMPRESSION: 1. The gallbladder is distended with wall thickening. No radiopaque calculi identified. No biliary ductal dilatation. Findings are concerning for acute cholecystitis. 2. Predominantly dependent bibasilar heterogeneous atelectasis or consolidation. Findings are concerning for infection or aspiration. 3. No obvious evidence of aortic aneurysm or dissection, however please note that noncontrast CT is very limited for the evaluation of aortic dissection and other acute aortic pathology. 4. Cardiomegaly. Electronically Signed   By: Eddie Candle M.D.   On: 11/19/2019 14:38   DG Chest 2 View  Result Date: 11/19/2019 CLINICAL DATA:  Right back pain EXAM: CHEST - 2 VIEW COMPARISON:  None. FINDINGS: Lungs  are clear.  No pleural effusion or pneumothorax. Cardiomegaly. Visualized osseous structures are within normal limits. IMPRESSION: No evidence of acute cardiopulmonary disease. Electronically Signed   By: Julian Hy M.D.   On: 11/19/2019 13:19   CT CHEST WO CONTRAST  Result Date: 11/19/2019 CLINICAL DATA:  Right-sided thoracic back pain, nausea, vomiting, diarrhea, aortic dissection suspected EXAM: CT CHEST, ABDOMEN AND PELVIS WITHOUT CONTRAST TECHNIQUE: Multidetector CT imaging of the chest, abdomen and pelvis was performed following the standard protocol without IV contrast. COMPARISON:  None. FINDINGS: CT CHEST FINDINGS Cardiovascular: No significant vascular findings. Cardiomegaly. No pericardial effusion. Mediastinum/Nodes: No enlarged mediastinal, hilar, or axillary lymph nodes. Thyroid gland, trachea, and esophagus demonstrate no significant findings. Lungs/Pleura: Dependent bibasilar heterogeneous atelectasis or consolidation. No pleural effusion or pneumothorax. Musculoskeletal: No chest wall mass or suspicious bone lesions identified. CT ABDOMEN PELVIS FINDINGS Hepatobiliary: No solid liver abnormality is seen. The gallbladder is  distended with wall thickening (series 3, image 77). No radiopaque calculi identified. No biliary ductal dilatation Pancreas: Unremarkable. No pancreatic ductal dilatation or surrounding inflammatory changes. Spleen: Normal in size without significant abnormality. Adrenals/Urinary Tract: Adrenal glands are unremarkable. Kidneys are normal, without renal calculi, solid lesion, or hydronephrosis. Bladder is unremarkable. Stomach/Bowel: Stomach is within normal limits. Appendix appears normal. No evidence of bowel wall thickening, distention, or inflammatory changes. Vascular/Lymphatic: No significant vascular findings are present. No enlarged abdominal or pelvic lymph nodes. Reproductive: No mass or other abnormality. Other: No abdominal wall hernia or abnormality. No abdominopelvic ascites. Musculoskeletal: No acute or significant osseous findings. IMPRESSION: 1. The gallbladder is distended with wall thickening. No radiopaque calculi identified. No biliary ductal dilatation. Findings are concerning for acute cholecystitis. 2. Predominantly dependent bibasilar heterogeneous atelectasis or consolidation. Findings are concerning for infection or aspiration. 3. No obvious evidence of aortic aneurysm or dissection, however please note that noncontrast CT is very limited for the evaluation of aortic dissection and other acute aortic pathology. 4. Cardiomegaly. Electronically Signed   By: Eddie Candle M.D.   On: 11/19/2019 14:38   US Aorta  Result Date: 11/19/2019 CLINICAL DATA:  34 year old male with hypertensive emergency and back pain for 3 days. EXAM: ULTRASOUND OF ABDOMINAL AORTA TECHNIQUE: Ultrasound examination of the abdominal aorta and proximal common iliac arteries was performed to evaluate for aneurysm. Additional color and Doppler images of the distal aorta were obtained to document patency. COMPARISON:  None. FINDINGS: Abdominal aortic measurements as follows: Proximal:  2.4 x 2.6 cm (AP by transverse)  Mid:         2.0 x 1.9 cm Distal:      1.8 x 2.0 cm Patent: Yes, peak systolic velocity is 778 cm/s Aortoiliac bifurcation and proximal iliac arteries are obscured by overlying bowel gas. IMPRESSION: Negative for abdominal aortic aneurysm. Electronically Signed   By: Genevie Ann M.D.   On: 11/19/2019 13:29   US Abdomen Limited RUQ  Result Date: 11/19/2019 CLINICAL DATA:  Gallstones EXAM: ULTRASOUND ABDOMEN LIMITED RIGHT UPPER QUADRANT COMPARISON:  CT from same day FINDINGS: Gallbladder: There is gallbladder sludge within a distended gallbladder. There is mild gallbladder wall thickening measuring up to 3.5 mm. There is pericholecystic free fluid. No distinct gallstones are identified. The sonographic Percell Miller sign was reported as negative. Common bile duct: Diameter: 3 mm Liver: The liver parenchyma is coarsened and heterogeneous. The portal triads appear to be echogenic. Portal vein is patent on color Doppler imaging with normal direction of blood flow towards the liver. Other: The right kidney is echogenic.  IMPRESSION: 1. Findings are equivocal for acute cholecystitis. While there is gallbladder wall thickening with pericholecystic free fluid in the presence of gallbladder sludge, the sonographic Murphy sign is negative. Consider further evaluation with HIDA scan as clinically indicated. 2. Coarsened and heterogeneous appearance of the liver with somewhat echogenic portal triads. Findings may indicate underlying hepatocellular disease or acute hepatitis. 3. Echogenic right kidney concerning for medical renal disease. Electronically Signed   By: Constance Holster M.D.   On: 11/19/2019 20:19    Review of Systems  Constitutional: Negative for chills, malaise/fatigue and weight loss.  Respiratory: Negative for hemoptysis, sputum production and shortness of breath.   Gastrointestinal: Negative for abdominal pain, nausea and vomiting.  Musculoskeletal: Positive for back pain.  All other systems reviewed and are  negative.   PE Blood pressure (!) 160/100, pulse 90, temperature 97.9 F (36.6 C), temperature source Oral, resp. rate (!) 23, SpO2 100 %. Constitutional: NAD; conversant; no deformities Eyes: Moist conjunctiva; no lid lag; anicteric; PERRL Neck: Trachea midline; no thyromegaly Lungs: Normal respiratory effort; no tactile fremitus CV: RRR; no palpable thrills; no pitting edema GI: Abd soft, nontender, nondistended, no Murphy's sign; no palpable hepatosplenomegaly MSK: unable gait; no clubbing/cyanosis Psychiatric: somnolent and slow to talkt; alert and oriented x3 Lymphatic: No palpable cervical or axillary lymphadenopathy   Assessment/Plan: 34 yo male with hypertensive urgency and acute renal failure who will begin dialysis in the morning. CT scan showed concern for thickening of gallbladder wall. Sludge noted on [place] but GBW within limits but notes fluid around gallbladder. No pain on exam. -antibiotics -continue with plan for dialysis -will reevaluate after dialysis, if he has abdominal pain then we will continue work up towards cholecystectomy  Arta Bruce Clarance Bollard 11/20/2019, 1:45 AM

## 2019-11-20 NOTE — ED Notes (Signed)
Catheter to right chest dressing changed and reinforced with quickclot and surgifoam.

## 2019-11-20 NOTE — ED Notes (Signed)
Pt noted to have converted to NSR with rate of 90 with no intervention. Dr. Irish Elders aware.

## 2019-11-20 NOTE — Progress Notes (Addendum)
Family Medicine Teaching Service Daily Progress Note Intern Pager: [phone]  Patient name: Samuel Burns Medical record number: [id number] Date of birth: 09/14/1985 Age: 34 y.o. Gender: male  Primary Care Provider: Patient, No Pcp Per Consultants: Nephrology, IR, cardiology, general surgery, CCM   Code Status: Full Code  Pt Overview and Major Events to Date:  10/3 Admitted 10/4 placement of tunneled HD catheter by IR, plan for HD, 1u pRBC  Assessment and Plan: Samuel Burns is a 34 y.o. male presenting with nausea and vomiting found to have hypertensive crisis with acute renal failure. PMH is significant for HTN.  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  Patient underwent successful placement of tunneled HD catheter this morning.  Plan for HD later today per nephrology.  Appreciate nephrology involvement. - s/p tunneled HD catheter placement today 10/4 - HD later today  Hypertensive emergency Initially presented with significant hypertension 248/137 on admission.  Was initially treated with nicardipine drip, but was transitioned off drip onto oral medications yesterday evening.  Adequate control with p.o. medications, most recent BP 159/103. - amlodipine 10 mg - carvedilol 3.125 mg BID - hydralazine prn - labetalol prn  Elevated troponin Troponins elevated but flat on admission; however increased this morning. Troponin 592 > 579 > 591 > 1,250 (this AM). Initially thought to be secondary to acute renal failure and hypertensive crisis. Now with bump in troponin, may represent demand ischemia. No ST elevations on EKG. Cardiology consulted, appreciate recommendations. - trend troponin - f/u cardiology recommendations  Elevated BNP BNP 1,267 on admission.  Likely  elevated in part due to his renal failure, though he may have some degree of CHF given his findings of cardiomegaly on imaging.  TTE pending. - f/u TTE  Back pain 3-day history of severe back pain prior to admission.  No evidence of aortic dissection or aortic aneurysm on CXR and CT chest/abdomen, though unable to use contrast given renal failure.  There is some concern for cholecystitis given CT findings of thickening gallbladder wall and ultrasound finding of biliary sludge and fluid around gallbladder.  Does have some RUQ tenderness on exam as well.  He does have an elevated alkaline phosphatase without elevation in AST/ALT, suggestive of cholestatic picture.  General surgery consulted and did see patient last night, they plan to use reevaluate the patient after HD.  They recommended starting broad-spectrum antibiotics. - piperacillin-tazobactam (10/4-) - Tylenol prn - IV hydromorphone 1 mg q3h prn - f/u surgery recs  Normocytic anemia Drop in hemoglobin this morning to 6.4, will transfuse. Laboratory studies suggestive of anemia of chronic inflammation given high ferritin and low TIBC and component of hemolysis given high reticulocyte count and elevated LDH (haptoglobin pending at this time).  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  Spoke with Dr. Irene Limbo with heme/onc, agreed with ADAMTS13 activity and peripheral blood smear; also recommended PT/INR, APTT, and Coombs test.  Appreciate recommendations and will formally consult if needed. - labs: peripheral blood smear, ADAMTS13 activity, PT/INR, APTT - f/u haptoglobin - formally consult heme/onc if needed - 1u pRBC, f/u post-transfusion H/H - transfusion threshold 7  Thrombocytopenia   Catheter site bleeding Platelets 58 this morning.  Suspect this is largely due to uremia in  the setting of acute renal  failure.  We will also consider TTP, HUS, and DIC; work-up as above.  Patient was noted to have persistent oozing around the site of his catheter following his procedure this morning.  Some concern for DIC given persistent bleeding, thrombocytopenia, and multisystem dysfunction.  Fibrinogen and D-dimer were added to previous labs.  Though he has mildly elevated PTT (47) and elevated D-dimer (4.07), I have low suspicion for DIC given normal fibrinogen levels.  Discussed case with CCM.  Recommended FFP rather than platelets.  He was evaluated by CCM and determined to be stable for floor.  On follow-up evaluation, his dressings around the catheter site were saturated with blood but no evidence of active bleeding. - s/p FFP - IR team to place tegaderm and quick-clot on catheter site - ddAVP if bleeding again  A Flutter Upon returning from having his dialysis catheter placed, patient was noted by nursing to be in a tachyarrhythmia concerning for a flutter.  Upon evaluation, he was tachycardic into the 140s.  IV metoprolol was ordered, but patient spontaneously converted back to NSR before metoprolol could be given.  Cardiology consulted, appreciate recommendations. - continue to monitor - f/u cardiology recommendations   FEN/GI: renal diet PPx: SCDs  Disposition: Progressive  Subjective:  Weaned off of nicardipine drip last night.  Attempted to see patient in the morning, but patient was in the IR suite undergoing procedure.  Patient was seen by me this afternoon with spouse at bedside; she states he is still sleepy and waking up from anesthesia.  Patient states he is still having a lot of the right sided back pain.  He feels very anxious about his condition and is worried about his 82-year-old son.  Objective: Temp:  [97.8 F (36.6 C)-98.4 F (36.9 C)] 98.4 F (36.9 C) (10/04 1411) Pulse Rate:  [84-131] 84 (10/04 1411) Resp:  [19-33] 20 (10/04 1411) BP: (144-195)/(79-124) 159/103 (10/04  1411) SpO2:  [97 %-100 %] 100 % (10/04 1411) Physical Exam: General: Somnolent but responsive, anxious and tearful, NAD HEENT: MM dry, no palatal petechiae Cardiovascular: RRR, 3/6 systolic murmur best heard at the LUSB Respiratory: CTAB, no wheezes or rales, no respiratory distress Abdomen: soft, RUQ tenderness to deep palpation, negative Murphy's sign Extremities: WWP Derm: no petechiae or purpura appreciated, catheter site on right chest dressings saturated with blood but no evidence of active bleeding  Laboratory: Recent Labs  Lab 11/19/19 0906 11/20/19 0428  WBC 13.7* 9.5  HGB 7.3* 6.4*  HCT 21.8* 19.1*  PLT 58* 67*   Recent Labs  Lab 11/19/19 0906 11/20/19 0428  NA 135 134*  K 3.7 3.7  CL 89* 92*  CO2 15* 13*  BUN 257* 266*  CREATININE 34.21* 35.25*  CALCIUM 9.5 8.7*  PROT 7.9  --   BILITOT 2.3*  --   ALKPHOS 232*  --   ALT 21  --   AST 13*  --   GLUCOSE 131* 109*     Imaging/Diagnostic Tests: IR Fluoro Guide CV Line Right  Result Date: 11/20/2019 INDICATION: In need of placement of a tunneled hemodialysis catheter for the initiation of dialysis. EXAM: TUNNELED CENTRAL VENOUS HEMODIALYSIS CATHETER PLACEMENT WITH ULTRASOUND AND FLUOROSCOPIC GUIDANCE MEDICATIONS: Zosyn 3.375 g IV. The antibiotic was given in an appropriate time interval prior to skin puncture. ANESTHESIA/SEDATION: Moderate (conscious) sedation was employed during this procedure. A total of Versed 1 mg and Fentanyl 50 mcg was administered intravenously. Moderate Sedation Time: 15 minutes. The patient's level  of consciousness and vital signs were monitored continuously by radiology nursing throughout the procedure under my direct supervision. FLUOROSCOPY TIME:  24 seconds (5 mGy). COMPLICATIONS: None immediate. PROCEDURE: Informed written consent was obtained from the patient after a discussion of the risks, benefits, and alternatives to treatment. Questions regarding the procedure were encouraged and  answered. The right neck and chest were prepped with chlorhexidine in a sterile fashion, and a sterile drape was applied covering the operative field. Maximum barrier sterile technique with sterile gowns and gloves were used for the procedure. A timeout was performed prior to the initiation of the procedure. After creating a small venotomy incision, a micropuncture kit was utilized to access the internal jugular vein. Real-time ultrasound guidance was utilized for vascular access including the acquisition of a permanent ultrasound image documenting patency of the accessed vessel. The microwire was utilized to measure appropriate catheter length. A stiff Glidewire was advanced to the level of the IVC and the micropuncture sheath was exchanged for a peel-away sheath. A palindrome tunneled hemodialysis catheter measuring 23 cm from tip to cuff was tunneled in a retrograde fashion from the anterior chest wall to the venotomy incision. The catheter was then placed through the peel-away sheath with tips ultimately positioned within the superior aspect of the right atrium. Final catheter positioning was confirmed and documented with a spot radiographic image. The catheter aspirates and flushes normally. The catheter was flushed with appropriate volume heparin dwells. The catheter exit site was secured with a 0-Prolene retention suture. The venotomy incision was closed with an interrupted 4-0 Vicryl, Dermabond and Steri-strips. Dressings were applied. The patient tolerated the procedure well without immediate post procedural complication. IMPRESSION: Successful placement of 23 cm tip to cuff tunneled hemodialysis catheter via the right internal jugular vein with tips terminating within the superior aspect of the right atrium. The catheter is ready for immediate use. Electronically Signed   By: Sandi Mariscal M.D.   On: 11/20/2019 10:53   IR US Guide Vasc Access Right  Result Date: 11/20/2019 INDICATION: In need of  placement of a tunneled hemodialysis catheter for the initiation of dialysis. EXAM: TUNNELED CENTRAL VENOUS HEMODIALYSIS CATHETER PLACEMENT WITH ULTRASOUND AND FLUOROSCOPIC GUIDANCE MEDICATIONS: Zosyn 3.375 g IV. The antibiotic was given in an appropriate time interval prior to skin puncture. ANESTHESIA/SEDATION: Moderate (conscious) sedation was employed during this procedure. A total of Versed 1 mg and Fentanyl 50 mcg was administered intravenously. Moderate Sedation Time: 15 minutes. The patient's level of consciousness and vital signs were monitored continuously by radiology nursing throughout the procedure under my direct supervision. FLUOROSCOPY TIME:  24 seconds (5 mGy). COMPLICATIONS: None immediate. PROCEDURE: Informed written consent was obtained from the patient after a discussion of the risks, benefits, and alternatives to treatment. Questions regarding the procedure were encouraged and answered. The right neck and chest were prepped with chlorhexidine in a sterile fashion, and a sterile drape was applied covering the operative field. Maximum barrier sterile technique with sterile gowns and gloves were used for the procedure. A timeout was performed prior to the initiation of the procedure. After creating a small venotomy incision, a micropuncture kit was utilized to access the internal jugular vein. Real-time ultrasound guidance was utilized for vascular access including the acquisition of a permanent ultrasound image documenting patency of the accessed vessel. The microwire was utilized to measure appropriate catheter length. A stiff Glidewire was advanced to the level of the IVC and the micropuncture sheath was exchanged for a peel-away sheath. A palindrome  tunneled hemodialysis catheter measuring 23 cm from tip to cuff was tunneled in a retrograde fashion from the anterior chest wall to the venotomy incision. The catheter was then placed through the peel-away sheath with tips ultimately positioned  within the superior aspect of the right atrium. Final catheter positioning was confirmed and documented with a spot radiographic image. The catheter aspirates and flushes normally. The catheter was flushed with appropriate volume heparin dwells. The catheter exit site was secured with a 0-Prolene retention suture. The venotomy incision was closed with an interrupted 4-0 Vicryl, Dermabond and Steri-strips. Dressings were applied. The patient tolerated the procedure well without immediate post procedural complication. IMPRESSION: Successful placement of 23 cm tip to cuff tunneled hemodialysis catheter via the right internal jugular vein with tips terminating within the superior aspect of the right atrium. The catheter is ready for immediate use. Electronically Signed   By: Sandi Mariscal M.D.   On: 11/20/2019 10:53   US Abdomen Limited RUQ  Result Date: 11/19/2019 CLINICAL DATA:  Gallstones EXAM: ULTRASOUND ABDOMEN LIMITED RIGHT UPPER QUADRANT COMPARISON:  CT from same day FINDINGS: Gallbladder: There is gallbladder sludge within a distended gallbladder. There is mild gallbladder wall thickening measuring up to 3.5 mm. There is pericholecystic free fluid. No distinct gallstones are identified. The sonographic Percell Miller sign was reported as negative. Common bile duct: Diameter: 3 mm Liver: The liver parenchyma is coarsened and heterogeneous. The portal triads appear to be echogenic. Portal vein is patent on color Doppler imaging with normal direction of blood flow towards the liver. Other: The right kidney is echogenic. IMPRESSION: 1. Findings are equivocal for acute cholecystitis. While there is gallbladder wall thickening with pericholecystic free fluid in the presence of gallbladder sludge, the sonographic Murphy sign is negative. Consider further evaluation with HIDA scan as clinically indicated. 2. Coarsened and heterogeneous appearance of the liver with somewhat echogenic portal triads. Findings may indicate  underlying hepatocellular disease or acute hepatitis. 3. Echogenic right kidney concerning for medical renal disease. Electronically Signed   By: Constance Holster M.D.   On: 11/19/2019 20:19     Zola Button, MD 11/20/2019, 2:36 PM PGY-1, [place] Intern pager: [phone], text pages welcome

## 2019-11-20 NOTE — Consult Note (Signed)
NAME:  Samuel Burns, MRN:  [id number], DOB:  Mar 02, 1985, LOS: 1 ADMISSION DATE:  11/19/2019, CONSULTATION DATE:  11/20/2019 REFERRING MD:  Dr. Jeannine Kitten, CHIEF COMPLAINT: HTN emergency  Brief History   Samuel Burns is a 34 y/o gentleman with a PMHx of uncontrolled HTN who presented with nausea and vomiting, currently admitted for hypertensive emergency complicated by acute renal failure,  anemia, thrombocytopenia and cholecystitis.   History of present illness   Samuel Burns is a 35 y.o. male presenting with 2 weeks of nausea and 2 days of back pain.  Patient states for the past 2 weeks, he has felt nausea, fatigue, decreased appetite, and altered sense of taste and smell.  He began having back pain on his lower right side started 2 days ago which came on all of a sudden while he was laying down.  He also had an episode of vomiting yesterday and an episode of diarrhea this morning which was nonbloody.  He has been urinating less, but still urinating 1-2 times a day.  No fevers/chills.  He has a history of hypertension diagnosed over 5 years ago.  He states he was previously on a medication that started with L, but does not remember what it was.  Currently, he has not taken his medications for years.  Not vaccinated against COVID, but is interested in getting the Covid vaccine.   No surgical history.   Dad recently died from kidney failure.  On dialysis for 2 years.  Dad was 25 yo.    Past Medical History   Past Medical History:  Diagnosis Date  . Asthma    Significant Hospital Events   10/03 > Admitted to [facility]  Consults:  Nephrology Cardiology PCCM  Procedures:  10/4 > Tunneled HD Cath placement by IR   Significant Diagnostic Tests:    Micro Data:  COVID-19: Negative BCx: NGTD x 12 hours   Antimicrobials:  Zosyn (10/03 >>)   Interim history/subjective:   Patient states the oozing from catheter site has improved greatly. He denies any pain at the site. Samuel Burns  endorses continued thoracic back pain as well as abdominal pain. He is starting to feel hungry though and was hoping to eat.   He understands the plan for dialysis today.   Objective   Blood pressure (!) 159/104, pulse 86, temperature 97.9 F (36.6 C), temperature source Oral, resp. rate (!) 23, SpO2 100 %.        Intake/Output Summary (Last 24 hours) at 11/20/2019 1318 Last data filed at 11/20/2019 0045 Gross per 24 hour  Intake 50 ml  Output --  Net 50 ml   There were no vitals filed for this visit.  Physical Exam Vitals and nursing note reviewed.  Constitutional:      General: He is not in acute distress.    Appearance: Normal appearance. He is well-developed. He is ill-appearing.  HENT:     Head: Normocephalic and atraumatic.     Mouth/Throat:     Mouth: Mucous membranes are dry.     Pharynx: Oropharynx is clear.  Cardiovascular:     Rate and Rhythm: Normal rate and regular rhythm.     Pulses:          Dorsalis pedis pulses are 2+ on the right side and 2+ on the left side.     Heart sounds: Murmur (2/6 holosystolic murmur heard best at the LLSB) heard.   Pulmonary:     Effort: Pulmonary effort is normal. No respiratory distress.  Breath sounds: No wheezing or rales.  Abdominal:     General: Bowel sounds are normal. There is no distension.     Palpations: Abdomen is soft.     Tenderness: There is abdominal tenderness (RUQ tenderness to palpation with minimal guarding). There is guarding.  Musculoskeletal:     Right lower leg: Edema (trace) present.     Left lower leg: Edema (trace) present.  Skin:    General: Skin is warm and dry.     Findings: No bruising, lesion or petechiae.     Comments: Right chest with catheter placed. Overlying gauze with some blood saturation, however no sign of active bleeding.   Neurological:     General: No focal deficit present.     Mental Status: He is alert and oriented to person, place, and time. Mental status is at baseline.   Psychiatric:        Mood and Affect: Mood normal.        Behavior: Behavior normal. Behavior is cooperative.    Resolved Hospital Problem list   N/A  Assessment & Plan:   # Hypertensive Emergency  # Hx of Hypertension  # Demand Ischemia  - At least 5 year history of uncontrolled HTN that has been untreated for the past year  - No previous work up for secondary causes - On admission, BP was 248/137. Nicardipine gtt initiated and since discontinued. Currently on PO Amlodipine 10 mg, Coreg 3.125 mg. Current BP: 159/104. - PRN Hydralazine and Labetalol added on - Elevated BNP with LVH on EKG concerning for CHF. TTE pending at this time.  - Would recommend work up for secondary causes either during admission or by PCP after discharge - Cardiology has been consulted by primary team - Stable for floor   # Tachyarrhythmia  - Concern for atrial flutter by primary team with spontaneous conversion to sinus rhythm - Not captured on EKG, which shows sinus rhythm - Cardiology is consulted  - Hold anticoagulation in light of anemia and thrombocytopenia   # Acute vs Chronic Renal Failure  # Uremia # Hyperphosphatemia  # AGMA - Last known normal kidney function approximately 2 years ago. Now presenting with creatinine of 35 and BUN of 266.  - Tunneled HD cath placed today - Nephrology on board and planning for dialysis today   # Acute Normocytic Anemia  # Thrombocytopenia  - On admission, hemoglobin noted to be low with normal MCV. Iron panel shows iron deficiency anemia. This may be representative of anemia of chronic renal disease - He has required 1 unit of pRBCS today after hemoglobin decreased to 6.4 - Continue to monitor daily. Transfuse PRN to maintain hemoglobin > 7.0 - Thrombocytopenia noted on admission with improvement today to 67. Post-HDC placement, patient had oozing from catheter site.  - Elevated D-dimer and PTT initially concerning for DIC, however Fibrinogen and PT are  WNL. Peripheral smear personally reviewed and no schistocytes notable. Due to these findings, DIC less likely. TTP also considered given patient's renal failure, however schistocytes would still be expected; no platelet clumping on smear. LDH is quite elevated, however direct coomb's test is negative today. - Haptoglobin and ADAMS-13 still pending.  - May benefit from heme/onc referral +/- bone marrow evaluation on discharge - Oozing most likely secondary to uremia. Expect to improve with dialysis.  - 1 unit FFP transfused - On examination, oozing at catheter site stopped.   # Acute Cholecystitis  - RUQ tenderness to radiological findings consistent with cholecystitis.  Surgery consulted and recommending medical management given poor surgical candidacy in current condition.  - Continue Zosyn   # Tunneled HD catheter complication  - Oozing noted after placement - IR has re-dressed site  Best practice:  Diet: NPO Pain/Anxiety/Delirium protocol (if indicated): N/A VAP protocol (if indicated): N/A DVT prophylaxis: SCDs GI prophylaxis: None  Glucose control: None  Mobility: Bedrest Code Status: Full Family Communication:  Disposition: Floor appropriate   Labs   CBC: Recent Labs  Lab 11/19/19 0906 11/20/19 0428  WBC 13.7* 9.5  HGB 7.3* 6.4*  HCT 21.8* 19.1*  MCV 89.3 91.0  PLT 58* 67*    Basic Metabolic Panel: Recent Labs  Lab 11/19/19 0906 11/20/19 0428  NA 135 134*  K 3.7 3.7  CL 89* 92*  CO2 15* 13*  GLUCOSE 131* 109*  BUN 257* 266*  CREATININE 34.21* 35.25*  CALCIUM 9.5 8.7*  PHOS  --  >30.0*   GFR: CrCl cannot be calculated (Unknown ideal weight.). Recent Labs  Lab 11/19/19 0906 11/20/19 0428  WBC 13.7* 9.5    Liver Function Tests: Recent Labs  Lab 11/19/19 0906 11/20/19 0428  AST 13*  --   ALT 21  --   ALKPHOS 232*  --   BILITOT 2.3*  --   PROT 7.9  --   ALBUMIN 4.5 3.6   Recent Labs  Lab 11/19/19 0906  LIPASE 169*   No results for  input(s): AMMONIA in the last 168 hours.  ABG No results found for: PHART, PCO2ART, PO2ART, HCO3, TCO2, ACIDBASEDEF, O2SAT   Coagulation Profile: Recent Labs  Lab 11/20/19 1105  INR 1.1    Cardiac Enzymes: No results for input(s): CKTOTAL, CKMB, CKMBINDEX, TROPONINI in the last 168 hours.  HbA1C: Hgb A1c MFr Bld  Date/Time Value Ref Range Status  11/20/2019 04:28 AM 3.5 (L) 4.8 - 5.6 % Final    Comment:    (NOTE) Pre diabetes:          5.7%-6.4%  Diabetes:              >6.4%  Glycemic control for   <7.0% adults with diabetes    CBG: No results for input(s): GLUCAP in the last 168 hours.  Review of Systems:   Negative except as noted above.   Past Medical History  He,  has a past medical history of Asthma.   Surgical History    Past Surgical History:  Procedure Laterality Date  . IR FLUORO GUIDE CV LINE RIGHT  11/20/2019  . IR US GUIDE VASC ACCESS RIGHT  11/20/2019     Social History   reports that he has never smoked. He has never used smokeless tobacco. He reports current drug use. Drug: Marijuana. He reports that he does not drink alcohol.   Family History   His family history includes Hypertension in his maternal grandmother.   Allergies No Known Allergies   Home Medications  Prior to Admission medications   Medication Sig Start Date End Date Taking? Authorizing Provider  acetaminophen (TYLENOL) 500 MG tablet Take 500-1,000 mg by mouth every 6 (six) hours as needed (for pain).   Yes [provider]  amLODipine (NORVASC) 10 MG tablet Take 1 tablet (10 mg total) by mouth daily. Patient not taking: Reported on 11/19/2019 04/08/17   Jaynee Eagles, PA-C  atorvastatin (LIPITOR) 10 MG tablet Take 1 tablet (10 mg total) by mouth daily. Patient not taking: Reported on 11/19/2019 04/15/17   Jaynee Eagles, PA-C  cetirizine-pseudoephedrine (ZYRTEC-D) 5-120 MG  tablet Take 1 tablet by mouth daily. Patient not taking: Reported on 11/19/2019 03/23/17   Frederica Kuster,  PA-C  losartan (COZAAR) 100 MG tablet Take 1 tablet (100 mg total) by mouth daily. Patient not taking: Reported on 11/19/2019 04/08/17   Jaynee Eagles, PA-C    Dr. Jose Persia Internal Medicine PGY-2  Pager: [phone] After 5pm on weekdays and 1pm on weekends: On Call pager [phone]  11/20/2019, 1:18 PM

## 2019-11-20 NOTE — Progress Notes (Signed)
OT Cancellation Note  Patient Details Name: Samuel Burns MRN: [id number] DOB: Oct 04, 1985   Cancelled Treatment:    Reason Eval/Treat Not Completed: Medical issues which prohibited therapy Pt with low hgb, elevated BP (173/124 mmHg), and elevated HR. Will hold until pt medically appropriate and follow up as schedule allows.  [place], OTR/L Acute Rehab Services Pager [phone] Office [phone]     Almon Register 11/20/2019, 11:03 AM

## 2019-11-20 NOTE — ED Notes (Signed)
Report received from [facility] in IR. HE states procedure has been completed. Pt A&Ox4. HR increased to 130s. BP 173/124, spo2 100%. Will reassess pt when he returns to unit.

## 2019-11-20 NOTE — ED Notes (Signed)
Dr. Irish Elders at bedside

## 2019-11-20 NOTE — ED Notes (Signed)
Date and time results received: 11/20/19 0511 (use smartphrase ".now" to insert current time)  Test: Hgb Critical Value: 6.4  Name of Provider Notified: MD Ardelia Mems  Orders Received? Or Actions Taken?: Will message provider

## 2019-11-20 NOTE — Sedation Documentation (Signed)
Patient transported back to ED. Went into ST towards the end of procedure. Patient attached to monitor and 12 lead EKG obtained.

## 2019-11-20 NOTE — Progress Notes (Signed)
[facility] Progress Note   Subjective:   S/p IR TDC placement. Surgery consulted for poss cholecystitis overnight = unlikely, following.  Objective Vitals:   11/20/19 0400 11/20/19 0430 11/20/19 0500 11/20/19 0530  BP: (!) 174/105 (!) 174/104 (!) 174/105 (!) 176/105  Pulse: 90 89 89 89  Resp: (!) 27 (!) 28 (!) 27 (!) 29  Temp:      TempSrc:      SpO2: 99% 100% 100% 100%   Physical Exam General: nontoxic on stretcher, hiccups. Heart: RRR, no rub Lungs: clear Abdomen: soft, nontender Extremities: no edema Dialysis Access:  RIJ TDC with oozing on dressing Neuro:  Conversant  Additional Objective Labs: Basic Metabolic Panel: Recent Labs  Lab 11/19/19 0906 11/20/19 0428  NA 135 134*  K 3.7 3.7  CL 89* 92*  CO2 15* 13*  GLUCOSE 131* 109*  BUN 257* 266*  CREATININE 34.21* 35.25*  CALCIUM 9.5 8.7*  PHOS  --  >30.0*   Liver Function Tests: Recent Labs  Lab 11/19/19 0906 11/20/19 0428  AST 13*  --   ALT 21  --   ALKPHOS 232*  --   BILITOT 2.3*  --   PROT 7.9  --   ALBUMIN 4.5 3.6   Recent Labs  Lab 11/19/19 0906  LIPASE 169*   CBC: Recent Labs  Lab 11/19/19 0906 11/20/19 0428  WBC 13.7* 9.5  HGB 7.3* 6.4*  HCT 21.8* 19.1*  MCV 89.3 91.0  PLT 58* 67*   Blood Culture    Component Value Date/Time   SDES THROAT 03/22/2017 2345   SPECREQUEST NONE Reflexed from [id number] 03/22/2017 2345   CULT FEW STREPTOCOCCUS,BETA HEMOLYTIC NOT GROUP A 03/22/2017 2345   REPTSTATUS 03/25/2017 FINAL 03/22/2017 2345    Cardiac Enzymes: No results for input(s): CKTOTAL, CKMB, CKMBINDEX, TROPONINI in the last 168 hours. CBG: No results for input(s): GLUCAP in the last 168 hours. Iron Studies:  Recent Labs    11/20/19 0428  IRON 40*  TIBC 238*  FERRITIN 1,046*   @lablastinr3 @ Studies/Results: CT ABDOMEN PELVIS WO CONTRAST  Result Date: 11/19/2019 CLINICAL DATA:  Right-sided thoracic back pain, nausea, vomiting, diarrhea, aortic dissection  suspected EXAM: CT CHEST, ABDOMEN AND PELVIS WITHOUT CONTRAST TECHNIQUE: Multidetector CT imaging of the chest, abdomen and pelvis was performed following the standard protocol without IV contrast. COMPARISON:  None. FINDINGS: CT CHEST FINDINGS Cardiovascular: No significant vascular findings. Cardiomegaly. No pericardial effusion. Mediastinum/Nodes: No enlarged mediastinal, hilar, or axillary lymph nodes. Thyroid gland, trachea, and esophagus demonstrate no significant findings. Lungs/Pleura: Dependent bibasilar heterogeneous atelectasis or consolidation. No pleural effusion or pneumothorax. Musculoskeletal: No chest wall mass or suspicious bone lesions identified. CT ABDOMEN PELVIS FINDINGS Hepatobiliary: No solid liver abnormality is seen. The gallbladder is distended with wall thickening (series 3, image 77). No radiopaque calculi identified. No biliary ductal dilatation Pancreas: Unremarkable. No pancreatic ductal dilatation or surrounding inflammatory changes. Spleen: Normal in size without significant abnormality. Adrenals/Urinary Tract: Adrenal glands are unremarkable. Kidneys are normal, without renal calculi, solid lesion, or hydronephrosis. Bladder is unremarkable. Stomach/Bowel: Stomach is within normal limits. Appendix appears normal. No evidence of bowel wall thickening, distention, or inflammatory changes. Vascular/Lymphatic: No significant vascular findings are present. No enlarged abdominal or pelvic lymph nodes. Reproductive: No mass or other abnormality. Other: No abdominal wall hernia or abnormality. No abdominopelvic ascites. Musculoskeletal: No acute or significant osseous findings. IMPRESSION: 1. The gallbladder is distended with wall thickening. No radiopaque calculi identified. No biliary ductal dilatation. Findings are concerning for acute  cholecystitis. 2. Predominantly dependent bibasilar heterogeneous atelectasis or consolidation. Findings are concerning for infection or aspiration. 3.  No obvious evidence of aortic aneurysm or dissection, however please note that noncontrast CT is very limited for the evaluation of aortic dissection and other acute aortic pathology. 4. Cardiomegaly. Electronically Signed   By: Eddie Candle M.D.   On: 11/19/2019 14:38   DG Chest 2 View  Result Date: 11/19/2019 CLINICAL DATA:  Right back pain EXAM: CHEST - 2 VIEW COMPARISON:  None. FINDINGS: Lungs are clear.  No pleural effusion or pneumothorax. Cardiomegaly. Visualized osseous structures are within normal limits. IMPRESSION: No evidence of acute cardiopulmonary disease. Electronically Signed   By: Julian Hy M.D.   On: 11/19/2019 13:19   CT CHEST WO CONTRAST  Result Date: 11/19/2019 CLINICAL DATA:  Right-sided thoracic back pain, nausea, vomiting, diarrhea, aortic dissection suspected EXAM: CT CHEST, ABDOMEN AND PELVIS WITHOUT CONTRAST TECHNIQUE: Multidetector CT imaging of the chest, abdomen and pelvis was performed following the standard protocol without IV contrast. COMPARISON:  None. FINDINGS: CT CHEST FINDINGS Cardiovascular: No significant vascular findings. Cardiomegaly. No pericardial effusion. Mediastinum/Nodes: No enlarged mediastinal, hilar, or axillary lymph nodes. Thyroid gland, trachea, and esophagus demonstrate no significant findings. Lungs/Pleura: Dependent bibasilar heterogeneous atelectasis or consolidation. No pleural effusion or pneumothorax. Musculoskeletal: No chest wall mass or suspicious bone lesions identified. CT ABDOMEN PELVIS FINDINGS Hepatobiliary: No solid liver abnormality is seen. The gallbladder is distended with wall thickening (series 3, image 77). No radiopaque calculi identified. No biliary ductal dilatation Pancreas: Unremarkable. No pancreatic ductal dilatation or surrounding inflammatory changes. Spleen: Normal in size without significant abnormality. Adrenals/Urinary Tract: Adrenal glands are unremarkable. Kidneys are normal, without renal calculi, solid  lesion, or hydronephrosis. Bladder is unremarkable. Stomach/Bowel: Stomach is within normal limits. Appendix appears normal. No evidence of bowel wall thickening, distention, or inflammatory changes. Vascular/Lymphatic: No significant vascular findings are present. No enlarged abdominal or pelvic lymph nodes. Reproductive: No mass or other abnormality. Other: No abdominal wall hernia or abnormality. No abdominopelvic ascites. Musculoskeletal: No acute or significant osseous findings. IMPRESSION: 1. The gallbladder is distended with wall thickening. No radiopaque calculi identified. No biliary ductal dilatation. Findings are concerning for acute cholecystitis. 2. Predominantly dependent bibasilar heterogeneous atelectasis or consolidation. Findings are concerning for infection or aspiration. 3. No obvious evidence of aortic aneurysm or dissection, however please note that noncontrast CT is very limited for the evaluation of aortic dissection and other acute aortic pathology. 4. Cardiomegaly. Electronically Signed   By: Eddie Candle M.D.   On: 11/19/2019 14:38   US Aorta  Result Date: 11/19/2019 CLINICAL DATA:  34 year old male with hypertensive emergency and back pain for 3 days. EXAM: ULTRASOUND OF ABDOMINAL AORTA TECHNIQUE: Ultrasound examination of the abdominal aorta and proximal common iliac arteries was performed to evaluate for aneurysm. Additional color and Doppler images of the distal aorta were obtained to document patency. COMPARISON:  None. FINDINGS: Abdominal aortic measurements as follows: Proximal:  2.4 x 2.6 cm (AP by transverse) Mid:         2.0 x 1.9 cm Distal:      1.8 x 2.0 cm Patent: Yes, peak systolic velocity is 732 cm/s Aortoiliac bifurcation and proximal iliac arteries are obscured by overlying bowel gas. IMPRESSION: Negative for abdominal aortic aneurysm. Electronically Signed   By: Genevie Ann M.D.   On: 11/19/2019 13:29   US Abdomen Limited RUQ  Result Date: 11/19/2019 CLINICAL DATA:   Gallstones EXAM: ULTRASOUND ABDOMEN LIMITED RIGHT UPPER QUADRANT COMPARISON:  CT from same day FINDINGS: Gallbladder: There is gallbladder sludge within a distended gallbladder. There is mild gallbladder wall thickening measuring up to 3.5 mm. There is pericholecystic free fluid. No distinct gallstones are identified. The sonographic Percell Miller sign was reported as negative. Common bile duct: Diameter: 3 mm Liver: The liver parenchyma is coarsened and heterogeneous. The portal triads appear to be echogenic. Portal vein is patent on color Doppler imaging with normal direction of blood flow towards the liver. Other: The right kidney is echogenic. IMPRESSION: 1. Findings are equivocal for acute cholecystitis. While there is gallbladder wall thickening with pericholecystic free fluid in the presence of gallbladder sludge, the sonographic Murphy sign is negative. Consider further evaluation with HIDA scan as clinically indicated. 2. Coarsened and heterogeneous appearance of the liver with somewhat echogenic portal triads. Findings may indicate underlying hepatocellular disease or acute hepatitis. 3. Echogenic right kidney concerning for medical renal disease. Electronically Signed   By: Constance Holster M.D.   On: 11/19/2019 20:19   Medications: . niCARDipine Stopped (11/19/19 2351)  . piperacillin-tazobactam (ZOSYN)  IV     . sodium chloride   Intravenous Once  . amLODipine  10 mg Oral Daily  . carvedilol  3.125 mg Oral BID WC  . melatonin  5 mg Oral QHS    Assessment/Plan: 1.  Acute kidney injury versus end-stage renal disease: The available data points likely to end-stage renal disease.  s/p IR TDC today with HD #1 today, #2 Tues, #3 Wed planned.  Vein map and place AV access here if able. 2.  Hypertensive urgency: BPs currently reasonable in 160s off gtt.  Cont oral meds. 3.  Anemia: Likely anemia of chronic kidney disease but pending hemolysis w/u.  Initiate ESA, ferritin > 1000, hold IV iron.  1u pRBC  being given today. 4.  Secondary hyperparathyroidism: Screen for secondary hyperparathyroidism with PTH.  His calcium level is within acceptable range.  Phos measured at > 30, will recheck. 5.  Elevated troponin levels: Unclear if this is entirely due to his advanced chronic kidney disease or whether this represents an acute cardiac event.  Additional evaluation and management per cardiology. 6.  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  A hematology consult could be considered - discussed with primary team.  7.  Metabolic acidosis, Anion Gap:  Likely secondary to CKD, will improve with HD.  Start po bicarb for now given low intensity HD to start.   Jannifer Hick MD 11/20/2019, 9:41 AM  [facility] Pager: [phone]

## 2019-11-20 NOTE — ED Notes (Signed)
Provider paged to inform of Hgb

## 2019-11-20 NOTE — Progress Notes (Signed)
FPTS Interim Progress Note  S: Interviewed patient at bedside.   He reports that his abdominal pain is somewhat improved. He was very tired during interview that he attributes to having a stressful day. During interview someone arrived to take him up to his dialysis session.  O: BP (!) 156/85 (BP Location: Right Arm)    Pulse 81    Temp 97.8 F (36.6 C) (Oral)    Resp 20    Ht 5\' 7"  (1.702 m)    Wt 80.9 kg    SpO2 100%    BMI 27.93 kg/m   Physical Exam Vitals and nursing note reviewed.  Constitutional:      General: He is not in acute distress.    Appearance: Normal appearance. He is not ill-appearing, toxic-appearing or diaphoretic.  HENT:     Head: Normocephalic and atraumatic.  Cardiovascular:     Rate and Rhythm: Regular rhythm. Tachycardia present.     Pulses: Normal pulses.          Radial pulses are 2+ on the right side and 2+ on the left side.       Dorsalis pedis pulses are 2+ on the right side and 2+ on the left side.     Heart sounds: Normal heart sounds, S1 normal and S2 normal. No murmur heard.   Pulmonary:     Effort: Pulmonary effort is normal. No respiratory distress.     Breath sounds: Normal breath sounds. No wheezing.  Abdominal:     General: There is no distension.     Tenderness: There is abdominal tenderness in the right upper quadrant.  Musculoskeletal:     Right lower leg: 1+ Pitting Edema present.     Left lower leg: 1+ Pitting Edema present.  Neurological:     Mental Status: He is alert. Mental status is at baseline.  Psychiatric:        Mood and Affect: Mood normal.        Behavior: Behavior normal.        Thought Content: Thought content normal.      A/P: Will continue with current plans.  Briant Cedar, MD 11/20/2019, 9:12 PM PGY-1, [facility] pager [phone]

## 2019-11-20 NOTE — ED Notes (Signed)
Pt returned from IR at this time. HR 130s, Pt drowsy, but responds to verbal stimuli. Will continue to monitor.

## 2019-11-20 NOTE — ED Notes (Signed)
Pt states he has been npo since before midnight, other than sips with meds. IR nurse aware.

## 2019-11-20 NOTE — ED Notes (Signed)
Roselyn Reef, NP from IR at bedside assessing cath to right chest and redressing with kwikclot, gauze, and Tegaderm.

## 2019-11-20 NOTE — Procedures (Signed)
Pre-procedure Diagnosis: ESRD Post-procedure Diagnosis: Same  Successful placement of tunneled HD catheter with tips terminating within the superior aspect of the right atrium.    Complications: None Immediate EBL: Trace  The catheter is ready for immediate use.   Jay Iyanni Hepp, MD Pager #: [phone]   

## 2019-11-20 NOTE — ED Notes (Signed)
Bright red blood oozing from right chest catheter site. Estimated about 183mL blood loss at this time. Dr. Irish Elders aware.

## 2019-11-20 NOTE — Progress Notes (Signed)
PT Cancellation Note  Patient Details Name: Samuel Burns MRN: [id number] DOB: [birth year]   Cancelled Treatment:    Reason Eval/Treat Not Completed: Medical issues which prohibited therapy Pt with low hgb, elevated BP (173/124 mmHg), and elevated HR. Will hold until pt medically appropriate and follow up as schedule allows.   Reuel Derby, PT, DPT  Acute Rehabilitation Services  Pager: [phone] Office: [phone]  [name] 11/20/2019, 11:00 AM

## 2019-11-20 NOTE — Progress Notes (Signed)
Was paged about Hgb value of 6.4, I have ordered transfusion of 1 unit of RBC. Will inform day team to follow up post transfusion H&H.

## 2019-11-20 NOTE — ED Notes (Signed)
Dialysis contacted to inquire about time for treatment. Dialysis unable to give specific  time, but states it will be later tonight.

## 2019-11-21 ENCOUNTER — Inpatient Hospital Stay (HOSPITAL_COMMUNITY): Payer: Self-pay

## 2019-11-21 DIAGNOSIS — I4892 Unspecified atrial flutter: Secondary | ICD-10-CM | POA: Clinically undetermined

## 2019-11-21 DIAGNOSIS — I161 Hypertensive emergency: Secondary | ICD-10-CM

## 2019-11-21 LAB — COMPREHENSIVE METABOLIC PANEL
ALT: 12 U/L (ref 0–44)
AST: 9 U/L — ABNORMAL LOW (ref 15–41)
Albumin: 3.4 g/dL — ABNORMAL LOW (ref 3.5–5.0)
Alkaline Phosphatase: 146 U/L — ABNORMAL HIGH (ref 38–126)
Anion gap: 26 — ABNORMAL HIGH (ref 5–15)
BUN: 182 mg/dL — ABNORMAL HIGH (ref 6–20)
CO2: 16 mmol/L — ABNORMAL LOW (ref 22–32)
Calcium: 8.6 mg/dL — ABNORMAL LOW (ref 8.9–10.3)
Chloride: 95 mmol/L — ABNORMAL LOW (ref 98–111)
Creatinine, Ser: 23.62 mg/dL — ABNORMAL HIGH (ref 0.61–1.24)
GFR calc Af Amer: 2 mL/min — ABNORMAL LOW (ref >60)
GFR calc non Af Amer: 2 mL/min — ABNORMAL LOW (ref >60)
Glucose, Bld: 102 mg/dL — ABNORMAL HIGH (ref 70–99)
Potassium: 3.3 mmol/L — ABNORMAL LOW (ref 3.5–5.1)
Sodium: 137 mmol/L (ref 135–145)
Total Bilirubin: 2.4 mg/dL — ABNORMAL HIGH (ref 0.3–1.2)
Total Protein: 6.3 g/dL — ABNORMAL LOW (ref 6.5–8.1)

## 2019-11-21 LAB — CBC
HCT: 21.2 % — ABNORMAL LOW (ref 39.0–52.0)
HCT: 21 % — ABNORMAL LOW (ref 39.0–52.0)
HCT: 24 % — ABNORMAL LOW (ref 39.0–52.0)
Hemoglobin: 7.1 g/dL — ABNORMAL LOW (ref 13.0–17.0)
Hemoglobin: 7 g/dL — ABNORMAL LOW (ref 13.0–17.0)
Hemoglobin: 8 g/dL — ABNORMAL LOW (ref 13.0–17.0)
MCH: 29.7 pg (ref 26.0–34.0)
MCH: 29.8 pg (ref 26.0–34.0)
MCH: 30.4 pg (ref 26.0–34.0)
MCHC: 33.5 g/dL (ref 30.0–36.0)
MCHC: 33.3 g/dL (ref 30.0–36.0)
MCHC: 33.3 g/dL (ref 30.0–36.0)
MCV: 88.7 fL (ref 80.0–100.0)
MCV: 89.4 fL (ref 80.0–100.0)
MCV: 91.3 fL (ref 80.0–100.0)
Platelets: 69 10*3/uL — ABNORMAL LOW (ref 150–400)
Platelets: 70 10*3/uL — ABNORMAL LOW (ref 150–400)
Platelets: 71 10*3/uL — ABNORMAL LOW (ref 150–400)
RBC: 2.39 MIL/uL — ABNORMAL LOW (ref 4.22–5.81)
RBC: 2.35 MIL/uL — ABNORMAL LOW (ref 4.22–5.81)
RBC: 2.63 MIL/uL — ABNORMAL LOW (ref 4.22–5.81)
RDW: 17.5 % — ABNORMAL HIGH (ref 11.5–15.5)
RDW: 18.6 % — ABNORMAL HIGH (ref 11.5–15.5)
RDW: 18.4 % — ABNORMAL HIGH (ref 11.5–15.5)
WBC: 11.8 10*3/uL — ABNORMAL HIGH (ref 4.0–10.5)
WBC: 9.7 10*3/uL (ref 4.0–10.5)
WBC: 11.9 10*3/uL — ABNORMAL HIGH (ref 4.0–10.5)
nRBC: 0.2 % (ref 0.0–0.2)
nRBC: 0.3 % — ABNORMAL HIGH (ref 0.0–0.2)
nRBC: 0.3 % — ABNORMAL HIGH (ref 0.0–0.2)

## 2019-11-21 LAB — TROPONIN I (HIGH SENSITIVITY)
Troponin I (High Sensitivity): 2078 ng/L (ref <18)
Troponin I (High Sensitivity): 2024 ng/L (ref <18)
Troponin I (High Sensitivity): 2330 ng/L (ref <18)

## 2019-11-21 LAB — ECHOCARDIOGRAM COMPLETE
Area-P 1/2: 3.07 cm2
Calc EF: 48.8 %
Height: 67 in
S' Lateral: 4.3 cm
Single Plane A2C EF: 50.2 %
Single Plane A4C EF: 48.6 %
Weight: 2853.63 oz

## 2019-11-21 LAB — BPAM RBC
Blood Product Expiration Date: 202110302359
ISSUE DATE / TIME: 202110041134
Unit Type and Rh: 5100

## 2019-11-21 LAB — TYPE AND SCREEN
ABO/RH(D): O POS
Antibody Screen: NEGATIVE
Unit division: 0

## 2019-11-21 LAB — HAPTOGLOBIN: Haptoglobin: <10 mg/dL — ABNORMAL LOW (ref 17–317)

## 2019-11-21 LAB — PATHOLOGIST SMEAR REVIEW

## 2019-11-21 MED ORDER — METOPROLOL TARTRATE 25 MG PO TABS
25.0000 mg | ORAL_TABLET | Freq: Four times a day (QID) | ORAL | Status: DC
  Administered 2019-11-21: 25 mg via ORAL
  Filled 2019-11-21: qty 1

## 2019-11-21 MED ORDER — LIP MEDEX EX OINT
TOPICAL_OINTMENT | CUTANEOUS | Status: DC | PRN
  Filled 2019-11-21: qty 7

## 2019-11-21 MED ORDER — PERFLUTREN LIPID MICROSPHERE
1.0000 mL | INTRAVENOUS | Status: AC | PRN
  Administered 2019-11-21: 2 mL via INTRAVENOUS
  Filled 2019-11-21: qty 10

## 2019-11-21 MED ORDER — METOPROLOL TARTRATE 25 MG PO TABS: 25.0000 mg | ORAL_TABLET | Freq: Four times a day (QID) | ORAL | Status: DC

## 2019-11-21 MED ORDER — METOPROLOL TARTRATE 50 MG PO TABS
50.0000 mg | ORAL_TABLET | Freq: Three times a day (TID) | ORAL | Status: DC
  Administered 2019-11-22 – 2019-11-26 (×14): 50 mg via ORAL
  Filled 2019-11-21 (×13): qty 1

## 2019-11-21 MED ORDER — POTASSIUM CHLORIDE CRYS ER 20 MEQ PO TBCR
40.0000 meq | EXTENDED_RELEASE_TABLET | Freq: Two times a day (BID) | ORAL | Status: DC
  Administered 2019-11-21: 40 meq via ORAL
  Filled 2019-11-21: qty 2

## 2019-11-21 MED ORDER — LOSARTAN POTASSIUM 25 MG PO TABS
25.0000 mg | ORAL_TABLET | Freq: Every day | ORAL | Status: DC
  Administered 2019-11-22 – 2019-11-27 (×6): 25 mg via ORAL
  Filled 2019-11-21 (×6): qty 1

## 2019-11-21 MED ORDER — LABETALOL HCL 5 MG/ML IV SOLN
10.0000 mg | INTRAVENOUS | Status: DC | PRN
  Administered 2019-11-21 – 2019-11-22 (×5): 10 mg via INTRAVENOUS
  Filled 2019-11-21 (×5): qty 4

## 2019-11-21 MED ORDER — INFLUENZA VAC SPLIT QUAD 0.5 ML IM SUSY
0.5000 mL | PREFILLED_SYRINGE | INTRAMUSCULAR | Status: AC
  Administered 2019-11-22: 0.5 mL via INTRAMUSCULAR
  Filled 2019-11-21: qty 0.5

## 2019-11-21 MED ORDER — HEPARIN SODIUM (PORCINE) 1000 UNIT/ML IJ SOLN
INTRAMUSCULAR | Status: AC
  Filled 2019-11-21: qty 4

## 2019-11-21 NOTE — Hospital Course (Addendum)
Samuel Burns is a 34 y.o. male with a history of HTN who presented with hypertensive emergency with acute renal failure. Hospital course outlined by problem below:  Acute renal failure Patient presented with 2 weeks of fatigue, nausea, and decreased urine output found to have significantly elevated creatinine of 34.2, BUN of 257, and anion gap metabolic acidosis on admission.  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  He underwent placement of tunneled HD catheter by IR and had his first HD session on 10/4.  He also had right brachiocephalic AVF placement on 10/8 by vascular surgery.  He had improvement in his symptoms following HD.  Hypertensive emergency Presented with BP 240/137 on admission.  Patient was initially started on nicardipine drip, then was transitioned to oral medications and achieved adequate control prior to discharge.  His blood pressure was controlled with amlodipine 10 mg daily, metoprolol tartrate 75 mg twice daily, and losartan 25 mg daily.  On day of discharge, his BP was mostly in the 938H to 829H systolic.  Type 2 NSTEMI Patient recently presented with elevated troponin in the 500s which trended up to 2,300.  Patient did not have any chest pain or shortness of breath throughout admission.  EKGs obtained did not reveal any ST elevations.  Cardiology was consulted, it was ultimately felt that elevations in troponin were due to demand ischemia in the setting of acute renal failure and hypertensive emergency.  No further work-up was done.  HFpEF Due to elevated BNP of 1267 on admission, TTE was obtained which revealed EF 50 to 55%, G2DD, and severe concentric LVH.  He denies any significant edema on exam and did not require any diuresis.  He was treated for hypertension as above.  Pericardial effusion Patient was noted to have moderate pericardial effusion on TTE without evidence of tamponade. Thought to be likely secondary to uremia.  Cardiology recommended follow-up TTE in  2 to 3 weeks.  A Flutter Patient had 2 episodes of a flutter throughout admission which resolved spontaneously.  Cardiology was consulted, and patient was treated with beta-blocker as above.  Remained in NSR throughout the rest of admission.  Did not start on anticoagulation due to thrombocytopenia.  Normocytic anemia Patient presented with normocytic anemia with hemoglobin 7.3 on presentation.  Required 1u pRBC during admission for hemoglobin of 6.4.  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  Further work-up ruled out AIHA (negative Coombs). Hgb 7.6 on day of discharge.  Thrombocytopenia Presented with thrombocytopenia with platelets 58 on admission.  Ultimately felt to be due to MAHA and uremia.  Further work-up ruled out DIC (unremarkable coags), TTP (mildly low ADAMTS13 activity).  He did have bleeding at his catheter insertion site following the procedure and received FFP.  Wound was redressed and bleeding was controlled.  Platelets gradually improved, platelets 182 on day of discharge.  Thoracic back pain Patient presented with a 2-day history of right-sided thoracic back pain.  In the ED, patient had a CXR and CT chest/abdomen (unable to use contrast in the setting of renal failure) which ruled out aortic dissection and aortic aneurysm.  There was some concern for cholecystitis given CT findings of thickened gallbladder wall and ultrasound finding of biliary sludge and fluid around the gallbladder.  He did have some RUQ tenderness on exam as well.  Elevated ALP, but normal AST/ALT. General surgery  was consulted and had recommended starting broad-spectrum antibiotics (he received piperacillin tazobactam from 10/3-10/5).  On further reevaluation by general surgery, his symptoms resolved and they did not feel he had  cholecystitis.  Continued to remain free of back pain/abdominal pain for the rest of admission.  Covid vaccination Patient received his first dose of the [place] COVID-19 vaccine on 10/11.  Observed for 15 minutes without incident.  Follow up: - adjustment disorder: therapy outpatient  F/u TTE

## 2019-11-21 NOTE — Progress Notes (Signed)
Family Medicine Teaching Service Daily Progress Note Intern Pager: [phone]  Patient name: Samuel Burns Medical record number: [id number] Date of birth: 1985/06/19 Age: 34 y.o. Gender: male  Primary Care Provider: Patient, No Pcp Per Consultants: Nephrology, IR, cardiology, general surgery, CCM   Code Status: Full Code  Pt Overview and Major Events to Date:  10/3 Admitted 10/4 placement of tunneled HD catheter by IR, HD, 1u pRBC  Assessment and Plan: Samuel Burns is a 34 y.o. male presenting with nausea and vomiting found to have hypertensive crisis with acute renal failure. PMH is significant forHTN.  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 Patient underwent first HD treatment yesterday.  Appreciate nephrology involvement. Plan to place AV access during admission. - plan for HD today per nephrology - sodium bicarb per nephrology  Hypertensive emergency Initially presented with significant hypertension 248/137 on admission. Was initially treated with nicardipine drip, now on oral medications. BP 185/106 this morning.  Less BP lowering effect with change from carvedilol to metoprolol. - amlodipine 10 mg - metoprolol 25 mg q6h, consider increasing - labetalol prn - hydralazine prn  Elevated troponin Initially flat on admission, starting to trend upwards.  Still without chest pain or shortness of breath.  No ST elevations on EKG.  Most likely demand ischemia.  Discussed with cardiology, did not recommend any further interventions and recommended q4-6 hour troponins and only EKG if becomes symptomatic or change in clinical status.  May need cardiac cath pending TTE results. 579 > 591 > 1,250 > 1,368 > 1,453 > 1,715 > 2,078 > 2,024 - trend troponin q4h - EKG if symptomatic or change in clinical status - f/u TTE  Elevated BNP BNP 1,267 on admission.  Likely elevated in part due to his renal failure, though he may have some degree of CHF given his findings  of cardiomegaly on imaging.  TTE pending. - f/u TTE  Back pain 3-day history of severe back pain prior to admission.  No evidence of aortic dissection or aortic aneurysm, though unable to perform contrast studies.    Equivocal findings of cholecystitis on imaging.   General surgery consulted, appreciate involvement.  Started on broad-spectrum antibiotics per surgery recommendation. 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  TTP and HUS also on the differential but less likely.  Work-up pending for TTP.  DIC unlikely with normal PT/INR and normal fibrinogen. S/p 1u pRBC yesterday for Hgb 6.4, responded appropriately. Hgb 7.0 this AM.  Will need to increase transfusion threshold to 8 if determined that patient is having active ACS. - f/u haptoglobin, PBS, ADAMTS13 activity - formally consult heme/onc if needed - transfusion threshold 7  Thrombocytopenia Likely secondary to uremia.  Pending work-up for TTP as above.  DIC unlikely. Had some bleeding at his catheter site following placement yesterday.  Coag panel largely unremarkable.  This was dressed by IR and he received FFP. - consider ddAVP if bleeding again  A Flutter Had one episode following catheter placement which resolved spontaneously.  Had another episode upon transfer to 2W with HR 140s, patient unaware of heart rate. Cardiology consulted, appreciate involvement.  Changed from carvedilol to metoprolol for rate control. RRR this morning. - metoprolol tartrate 25 mg q6h - f/u cardiology recommendations  Covid vaccination Unvaccinated, interested in receiving. - will coordinate vaccination when more stable  FEN/GI: renal diet PPx: SCDs  Disposition: progressive  Subjective:  Sleepy this morning.  Dialysis went okay yesterday.  Denies chest pain.  Still feeling  bad overall.  Objective: Temp:  [97.8 F (36.6 C)-98.7 F (37.1 C)] 98.7 F (37.1 C) (10/05 0457) Pulse Rate:  [79-137] 87 (10/05 0457) Resp:  [18-28] 18 (10/05 0457) BP: (147-187)/(81-124) 180/103 (10/05 0457) SpO2:  [94 %-100 %] 100 % (10/05 0457) Weight:  [80.9 kg] 80.9 kg (10/04 2241) Physical Exam: General: Somnolent but responsive, NAD Cardiovascular: RRR, 3/6 systolic murmur best heard at the LUSB Respiratory: CTAB, no wheezes or rales, no respiratory distress Abdomen: soft, RUQ tenderness, +BS Extremities: WWP, trace edema  Laboratory: Recent Labs  Lab 11/20/19 0428 11/20/19 1951 11/21/19 0018  WBC 9.5 11.7* 11.8*  HGB 6.4* 7.5* 7.1*  HCT 19.1* 21.9* 21.2*  PLT 67* 74* 69*   Recent Labs  Lab 11/19/19 0906 11/20/19 0428 11/21/19 0018  NA 135 134* 137  K 3.7 3.7 3.3*  CL 89* 92* 95*  CO2 15* 13* 16*  BUN 257* 266* 182*  CREATININE 34.21* 35.25* 23.62*  CALCIUM 9.5 8.7* 8.6*  PROT 7.9  --  6.3*  BILITOT 2.3*  --  2.4*  ALKPHOS 232*  --  146*  ALT 21  --  12  AST 13*  --  9*  GLUCOSE 131* 109* 102*     Imaging/Diagnostic Tests: IR Fluoro Guide CV Line Right  Result Date: 11/20/2019 INDICATION: In need of placement of a tunneled hemodialysis catheter for the initiation of dialysis. EXAM: TUNNELED CENTRAL VENOUS HEMODIALYSIS CATHETER PLACEMENT WITH ULTRASOUND AND FLUOROSCOPIC GUIDANCE MEDICATIONS: Zosyn 3.375 g IV. The antibiotic was given in an appropriate time interval prior to skin puncture. ANESTHESIA/SEDATION: Moderate (conscious) sedation was employed during this procedure. A total of Versed 1 mg and Fentanyl 50 mcg was administered intravenously. Moderate Sedation Time: 15 minutes. The patient's level of consciousness and vital signs were monitored continuously by radiology nursing throughout the procedure under my direct supervision. FLUOROSCOPY TIME:  24 seconds (5 mGy). COMPLICATIONS: None immediate. PROCEDURE: Informed written consent was obtained  from the patient after a discussion of the risks, benefits, and alternatives to treatment. Questions regarding the procedure were encouraged and answered. The right neck and chest were prepped with chlorhexidine in a sterile fashion, and a sterile drape was applied covering the operative field. Maximum barrier sterile technique with sterile gowns and gloves were used for the procedure. A timeout was performed prior to the initiation of the procedure. After creating a small venotomy incision, a micropuncture kit was utilized to access the internal jugular vein. Real-time ultrasound guidance was utilized for vascular access including the acquisition of a permanent ultrasound image documenting patency of the accessed vessel. The microwire was utilized to measure appropriate catheter length. A stiff Glidewire was advanced to the level of the IVC and the micropuncture sheath was exchanged for a peel-away sheath. A palindrome tunneled hemodialysis catheter measuring 23 cm from tip to cuff was tunneled in a retrograde fashion from the anterior chest wall to the venotomy incision. The catheter was then placed through the peel-away sheath with tips ultimately positioned within the superior aspect of the right atrium. Final catheter positioning was confirmed and documented with a spot radiographic image. The catheter aspirates and flushes normally. The catheter was flushed with appropriate volume heparin dwells. The catheter exit site was secured with a 0-Prolene retention suture. The venotomy incision was closed with an interrupted 4-0 Vicryl, Dermabond and Steri-strips. Dressings were applied. The patient tolerated the  procedure well without immediate post procedural complication. IMPRESSION: Successful placement of 23 cm tip to cuff tunneled hemodialysis catheter via the right internal jugular vein with tips terminating within the superior aspect of the right atrium. The catheter is ready for immediate use. Electronically  Signed   By: Sandi Mariscal M.D.   On: 11/20/2019 10:53   IR US Guide Vasc Access Right  Result Date: 11/20/2019 INDICATION: In need of placement of a tunneled hemodialysis catheter for the initiation of dialysis. EXAM: TUNNELED CENTRAL VENOUS HEMODIALYSIS CATHETER PLACEMENT WITH ULTRASOUND AND FLUOROSCOPIC GUIDANCE MEDICATIONS: Zosyn 3.375 g IV. The antibiotic was given in an appropriate time interval prior to skin puncture. ANESTHESIA/SEDATION: Moderate (conscious) sedation was employed during this procedure. A total of Versed 1 mg and Fentanyl 50 mcg was administered intravenously. Moderate Sedation Time: 15 minutes. The patient's level of consciousness and vital signs were monitored continuously by radiology nursing throughout the procedure under my direct supervision. FLUOROSCOPY TIME:  24 seconds (5 mGy). COMPLICATIONS: None immediate. PROCEDURE: Informed written consent was obtained from the patient after a discussion of the risks, benefits, and alternatives to treatment. Questions regarding the procedure were encouraged and answered. The right neck and chest were prepped with chlorhexidine in a sterile fashion, and a sterile drape was applied covering the operative field. Maximum barrier sterile technique with sterile gowns and gloves were used for the procedure. A timeout was performed prior to the initiation of the procedure. After creating a small venotomy incision, a micropuncture kit was utilized to access the internal jugular vein. Real-time ultrasound guidance was utilized for vascular access including the acquisition of a permanent ultrasound image documenting patency of the accessed vessel. The microwire was utilized to measure appropriate catheter length. A stiff Glidewire was advanced to the level of the IVC and the micropuncture sheath was exchanged for a peel-away sheath. A palindrome tunneled hemodialysis catheter measuring 23 cm from tip to cuff was tunneled in a retrograde fashion from the  anterior chest wall to the venotomy incision. The catheter was then placed through the peel-away sheath with tips ultimately positioned within the superior aspect of the right atrium. Final catheter positioning was confirmed and documented with a spot radiographic image. The catheter aspirates and flushes normally. The catheter was flushed with appropriate volume heparin dwells. The catheter exit site was secured with a 0-Prolene retention suture. The venotomy incision was closed with an interrupted 4-0 Vicryl, Dermabond and Steri-strips. Dressings were applied. The patient tolerated the procedure well without immediate post procedural complication. IMPRESSION: Successful placement of 23 cm tip to cuff tunneled hemodialysis catheter via the right internal jugular vein with tips terminating within the superior aspect of the right atrium. The catheter is ready for immediate use. Electronically Signed   By: Sandi Mariscal M.D.   On: 11/20/2019 10:53    Zola Button, MD 11/21/2019, 6:53 AM PGY-1, [facility] Intern pager: [phone], text pages welcome

## 2019-11-21 NOTE — Progress Notes (Signed)
FPTS Interim Progress Note  Reviewed orders and progress notes from today. Per cardiology, recommended increase in Metoprolol from 25mg  q6 to 50mg  q8. Given continued elevated BP and HR in 80-90's, feel increase is acceptable. Will change order. First dose at 2330.  Discussed with nurse. Will continue to monitor. Has PRN Labetalol ordered for elevated BP's per cards.   Mina Marble [place], DO 11/21/2019, 10:23 PM PGY-3, [facility] Medicine Service pager [phone]

## 2019-11-21 NOTE — Progress Notes (Signed)
FPTS Interim Progress Note  S: Interviewed patient at bedside.  He was sitting up on the side of his bed eating dinner. He reports feeling better and getting some sleep during the day. He reports that his abdominal pain is improved. Has already had dialysis session today and tolerated it well. He has no other concerns at the moment.  O: BP (!) 207/124 (BP Location: Left Wrist)   Pulse 85   Temp 98.4 F (36.9 C) (Oral)   Resp 20   Ht 5\' 7"  (1.702 m)   Wt 80 kg   SpO2 100%   BMI 27.62 kg/m   Physical Exam Vitals and nursing note reviewed.  Constitutional:      General: He is not in acute distress.    Appearance: Normal appearance. He is normal weight. He is not ill-appearing, toxic-appearing or diaphoretic.  HENT:     Head: Normocephalic and atraumatic.  Cardiovascular:     Rate and Rhythm: Normal rate and regular rhythm.     Pulses: Normal pulses.          Radial pulses are 2+ on the right side and 2+ on the left side.       Dorsalis pedis pulses are 2+ on the right side and 2+ on the left side.     Heart sounds: Normal heart sounds, S1 normal and S2 normal. No murmur heard.   Pulmonary:     Effort: Pulmonary effort is normal. No respiratory distress.     Breath sounds: Normal breath sounds. No wheezing.  Abdominal:     General: There is no distension.     Tenderness: There is abdominal tenderness (mild) in the right upper quadrant.  Musculoskeletal:     Right lower leg: 1+ Pitting Edema present.     Left lower leg: 1+ Pitting Edema present.  Neurological:     Mental Status: He is alert. Mental status is at baseline.  Psychiatric:        Mood and Affect: Mood normal.        Behavior: Behavior normal.        Thought Content: Thought content normal.      A/P: Will not make any changes to current plans.  Briant Cedar, MD 11/21/2019, 9:36 PM PGY-1, [facility] Medicine Service pager [phone]

## 2019-11-21 NOTE — Evaluation (Signed)
Physical Therapy Evaluation Patient Details Name: Samuel Burns MRN: [id number] DOB: 1985-06-30 Today's Date: 11/21/2019   History of Present Illness  Samuel Burns is a 34 y.o. male with a hx of asthma, HTN now admitted 11/19/19 with thracic back pain for 3 days, Nausea for 2 weeks, vomiting and diarrhea and no BP meds in 3 weeks, BP on arrival 248/137 P 94, R 16 a febrile, Cr 34.21 BUN 257  who is being seen today for the evaluation of atrial flutter at the request of Dr. Ardelia Mems.  HTN crisis and a flutter wtih RVR.   Clinical Impression  Pt admitted with above diagnosis. Pt was able to ambulate with min assist with RW needing rW for support due to back and LE pain.  Pt safe with RW overall and cousin states pt will have 24 hour care at d/c.  Will follow acutely. Pt currently with functional limitations due to the deficits listed below (see PT Problem List). Pt will benefit from skilled PT to increase their independence and safety with mobility to allow discharge to the venue listed below.      Follow Up Recommendations Home health PT;Supervision/Assistance - 24 hour    Equipment Recommendations  Rolling walker with 5" wheels;3in1 (PT)    Recommendations for Other Services       Precautions / Restrictions Precautions Precautions: Fall Restrictions Weight Bearing Restrictions: No      Mobility  Bed Mobility Overal bed mobility: Independent                Transfers Overall transfer level: Needs assistance Equipment used: Rolling walker (2 wheeled) Transfers: Sit to/from Stand Sit to Stand: Min assist         General transfer comment: a little assist to power up and takes incr time due to back pain per pt  Ambulation/Gait Ambulation/Gait assistance: Min assist Gait Distance (Feet): 30 Feet Assistive device: Rolling walker (2 wheeled) Gait Pattern/deviations: Step-through pattern;Decreased stride length;Trunk flexed;Wide base of support;Drifts right/left   Gait  velocity interpretation: <1.31 ft/sec, indicative of household ambulator General Gait Details: Pt ambulated to bathroom and back to bed. "Legs feel heavy" per pt.  Slow gait with pt very slow moving and appears to have difficulty picking up feet each step he takes.  Did well wtih RW and states it helps him be more steady as the first few steps wtihout the RW pt was reaching for therapist and objects in room for support.   Stairs            Wheelchair Mobility    Modified Rankin (Stroke Patients Only)       Balance Overall balance assessment: Needs assistance Sitting-balance support: No upper extremity supported;Feet supported Sitting balance-Leahy Scale: Fair     Standing balance support: Bilateral upper extremity supported;During functional activity Standing balance-Leahy Scale: Poor Standing balance comment: relies on UE support for balance                             Pertinent Vitals/Pain Pain Assessment: Faces Faces Pain Scale: Hurts even more Pain Location: upper back and legs Pain Descriptors / Indicators: Aching;Heaviness;Grimacing;Guarding Pain Intervention(s): Limited activity within patient's tolerance;Monitored during session;Repositioned    Home Living Family/patient expects to be discharged to:: Private residence Living Arrangements: Alone Available Help at Discharge: Family;Available 24 hours/day Type of Home: House Home Access: Stairs to enter Entrance Stairs-Rails: Right;Left;Can reach both Entrance Stairs-Number of Steps: 10 Home Layout: One level Home  Equipment: None Additional Comments: Pt worked PT loading and unloading trucks    Prior Function Level of Independence: Independent               Journalist, newspaper        Extremity/Trunk Assessment   Upper Extremity Assessment Upper Extremity Assessment: Defer to OT evaluation    Lower Extremity Assessment Lower Extremity Assessment: Generalized weakness;RLE  deficits/detail;LLE deficits/detail RLE Deficits / Details: grossly 3/5 LLE Deficits / Details: grossly 3/5    Cervical / Trunk Assessment Cervical / Trunk Assessment: Kyphotic  Communication   Communication: No difficulties  Cognition Arousal/Alertness: Lethargic Behavior During Therapy: Flat affect Overall Cognitive Status: Within Functional Limits for tasks assessed                                        General Comments      Exercises     Assessment/Plan    PT Assessment Patient needs continued PT services  PT Problem List Decreased activity tolerance;Decreased balance;Decreased mobility;Decreased knowledge of use of DME;Decreased safety awareness;Decreased knowledge of precautions;Cardiopulmonary status limiting activity;Pain       PT Treatment Interventions DME instruction;Gait training;Stair training;Functional mobility training;Therapeutic activities;Therapeutic exercise;Balance training;Patient/family education    PT Goals (Current goals can be found in the Care Plan section)  Acute Rehab PT Goals Patient Stated Goal: to go home PT Goal Formulation: With patient Time For Goal Achievement: 12/05/19 Potential to Achieve Goals: Good    Frequency Min 3X/week   Barriers to discharge        Co-evaluation               AM-PAC PT "6 Clicks" Mobility  Outcome Measure Help needed turning from your back to your side while in a flat bed without using bedrails?: None Help needed moving from lying on your back to sitting on the side of a flat bed without using bedrails?: None Help needed moving to and from a bed to a chair (including a wheelchair)?: A Little Help needed standing up from a chair using your arms (e.g., wheelchair or bedside chair)?: A Little Help needed to walk in hospital room?: A Little Help needed climbing 3-5 steps with a railing? : A Little 6 Click Score: 20    End of Session Equipment Utilized During Treatment: Gait  belt Activity Tolerance: Patient limited by fatigue Patient left: in bed;with call bell/phone within reach;with bed alarm set;with family/visitor present Nurse Communication: Mobility status PT Visit Diagnosis: Unsteadiness on feet (R26.81);Muscle weakness (generalized) (M62.81);Pain Pain - Right/Left:  (bilateral) Pain - part of body: Leg (back)    Time: 6440-3474 PT Time Calculation (min) (ACUTE ONLY): 24 min   Charges:   PT Evaluation $PT Eval Moderate Complexity: 1 Mod PT Treatments $Gait Training: 8-22 mins        Luisenrique Conran W,PT Acute Rehabilitation Services Pager:  [phone]  Office:  [place] 11/21/2019, 12:24 PM

## 2019-11-21 NOTE — Progress Notes (Signed)
[facility] Progress Note     Subjective: CC-  Denies any abdominal pain, nausea, vomiting. Back pain improved. Tolerating diet but not eating much because it does not taste good. No postprandial abdominal pain.  Objective: Vital signs in last 24 hours: Temp:  [97.8 F (36.6 C)-98.7 F (37.1 C)] 98 F (36.7 C) (10/05 0810) Pulse Rate:  [79-137] 80 (10/05 0912) Resp:  [17-28] 17 (10/05 0810) BP: (147-192)/(81-124) 171/103 (10/05 0912) SpO2:  [94 %-100 %] 97 % (10/05 0810) Weight:  [80.9 kg] 80.9 kg (10/04 2241) Last BM Date: 11/17/19  Intake/Output from previous day: 10/04 0701 - 10/05 0700 In: 655.5 [P.O.:240; Blood:315; IV Piggyback:100.5] Out: 0  Intake/Output this shift: No intake/output data recorded.  PE: Gen:  Alert, NAD, pleasant Pulm:  rate and effort normal Abd: Soft, NT/ND, +BS  Lab Results:  Recent Labs    11/21/19 0018 11/21/19 0712  WBC 11.8* 9.7  HGB 7.1* 7.0*  HCT 21.2* 21.0*  PLT 69* 70*   BMET Recent Labs    11/20/19 0428 11/21/19 0018  NA 134* 137  K 3.7 3.3*  CL 92* 95*  CO2 13* 16*  GLUCOSE 109* 102*  BUN 266* 182*  CREATININE 35.25* 23.62*  CALCIUM 8.7* 8.6*   PT/INR Recent Labs    11/20/19 1105  LABPROT 14.2  INR 1.1   CMP     Component Value Date/Time   NA 137 11/21/2019 0018   NA 142 04/08/2017 1520   K 3.3 (L) 11/21/2019 0018   CL 95 (L) 11/21/2019 0018   CO2 16 (L) 11/21/2019 0018   GLUCOSE 102 (H) 11/21/2019 0018   BUN 182 (H) 11/21/2019 0018   BUN 17 04/08/2017 1520   CREATININE 23.62 (H) 11/21/2019 0018   CALCIUM 8.6 (L) 11/21/2019 0018   PROT 6.3 (L) 11/21/2019 0018   PROT 8.3 04/08/2017 1520   ALBUMIN 3.4 (L) 11/21/2019 0018   ALBUMIN 5.1 04/08/2017 1520   AST 9 (L) 11/21/2019 0018   ALT 12 11/21/2019 0018   ALKPHOS 146 (H) 11/21/2019 0018   BILITOT 2.4 (H) 11/21/2019 0018   BILITOT 1.0 04/08/2017 1520   GFRNONAA 2 (L) 11/21/2019 0018   GFRAA 2 (L) 11/21/2019 0018   Lipase      Component Value Date/Time   LIPASE 169 (H) 11/19/2019 0906       Studies/Results: CT ABDOMEN PELVIS WO CONTRAST  Result Date: 11/19/2019 CLINICAL DATA:  Right-sided thoracic back pain, nausea, vomiting, diarrhea, aortic dissection suspected EXAM: CT CHEST, ABDOMEN AND PELVIS WITHOUT CONTRAST TECHNIQUE: Multidetector CT imaging of the chest, abdomen and pelvis was performed following the standard protocol without IV contrast. COMPARISON:  None. FINDINGS: CT CHEST FINDINGS Cardiovascular: No significant vascular findings. Cardiomegaly. No pericardial effusion. Mediastinum/Nodes: No enlarged mediastinal, hilar, or axillary lymph nodes. Thyroid gland, trachea, and esophagus demonstrate no significant findings. Lungs/Pleura: Dependent bibasilar heterogeneous atelectasis or consolidation. No pleural effusion or pneumothorax. Musculoskeletal: No chest wall mass or suspicious bone lesions identified. CT ABDOMEN PELVIS FINDINGS Hepatobiliary: No solid liver abnormality is seen. The gallbladder is distended with wall thickening (series 3, image 77). No radiopaque calculi identified. No biliary ductal dilatation Pancreas: Unremarkable. No pancreatic ductal dilatation or surrounding inflammatory changes. Spleen: Normal in size without significant abnormality. Adrenals/Urinary Tract: Adrenal glands are unremarkable. Kidneys are normal, without renal calculi, solid lesion, or hydronephrosis. Bladder is unremarkable. Stomach/Bowel: Stomach is within normal limits. Appendix appears normal. No evidence of bowel wall thickening, distention, or inflammatory changes. Vascular/Lymphatic: No significant vascular  findings are present. No enlarged abdominal or pelvic lymph nodes. Reproductive: No mass or other abnormality. Other: No abdominal wall hernia or abnormality. No abdominopelvic ascites. Musculoskeletal: No acute or significant osseous findings. IMPRESSION: 1. The gallbladder is distended with wall thickening. No  radiopaque calculi identified. No biliary ductal dilatation. Findings are concerning for acute cholecystitis. 2. Predominantly dependent bibasilar heterogeneous atelectasis or consolidation. Findings are concerning for infection or aspiration. 3. No obvious evidence of aortic aneurysm or dissection, however please note that noncontrast CT is very limited for the evaluation of aortic dissection and other acute aortic pathology. 4. Cardiomegaly. Electronically Signed   By: Eddie Candle M.D.   On: 11/19/2019 14:38   DG Chest 2 View  Result Date: 11/19/2019 CLINICAL DATA:  Right back pain EXAM: CHEST - 2 VIEW COMPARISON:  None. FINDINGS: Lungs are clear.  No pleural effusion or pneumothorax. Cardiomegaly. Visualized osseous structures are within normal limits. IMPRESSION: No evidence of acute cardiopulmonary disease. Electronically Signed   By: Julian Hy M.D.   On: 11/19/2019 13:19   CT CHEST WO CONTRAST  Result Date: 11/19/2019 CLINICAL DATA:  Right-sided thoracic back pain, nausea, vomiting, diarrhea, aortic dissection suspected EXAM: CT CHEST, ABDOMEN AND PELVIS WITHOUT CONTRAST TECHNIQUE: Multidetector CT imaging of the chest, abdomen and pelvis was performed following the standard protocol without IV contrast. COMPARISON:  None. FINDINGS: CT CHEST FINDINGS Cardiovascular: No significant vascular findings. Cardiomegaly. No pericardial effusion. Mediastinum/Nodes: No enlarged mediastinal, hilar, or axillary lymph nodes. Thyroid gland, trachea, and esophagus demonstrate no significant findings. Lungs/Pleura: Dependent bibasilar heterogeneous atelectasis or consolidation. No pleural effusion or pneumothorax. Musculoskeletal: No chest wall mass or suspicious bone lesions identified. CT ABDOMEN PELVIS FINDINGS Hepatobiliary: No solid liver abnormality is seen. The gallbladder is distended with wall thickening (series 3, image 77). No radiopaque calculi identified. No biliary ductal dilatation Pancreas:  Unremarkable. No pancreatic ductal dilatation or surrounding inflammatory changes. Spleen: Normal in size without significant abnormality. Adrenals/Urinary Tract: Adrenal glands are unremarkable. Kidneys are normal, without renal calculi, solid lesion, or hydronephrosis. Bladder is unremarkable. Stomach/Bowel: Stomach is within normal limits. Appendix appears normal. No evidence of bowel wall thickening, distention, or inflammatory changes. Vascular/Lymphatic: No significant vascular findings are present. No enlarged abdominal or pelvic lymph nodes. Reproductive: No mass or other abnormality. Other: No abdominal wall hernia or abnormality. No abdominopelvic ascites. Musculoskeletal: No acute or significant osseous findings. IMPRESSION: 1. The gallbladder is distended with wall thickening. No radiopaque calculi identified. No biliary ductal dilatation. Findings are concerning for acute cholecystitis. 2. Predominantly dependent bibasilar heterogeneous atelectasis or consolidation. Findings are concerning for infection or aspiration. 3. No obvious evidence of aortic aneurysm or dissection, however please note that noncontrast CT is very limited for the evaluation of aortic dissection and other acute aortic pathology. 4. Cardiomegaly. Electronically Signed   By: Eddie Candle M.D.   On: 11/19/2019 14:38   US Aorta  Result Date: 11/19/2019 CLINICAL DATA:  34 year old male with hypertensive emergency and back pain for 3 days. EXAM: ULTRASOUND OF ABDOMINAL AORTA TECHNIQUE: Ultrasound examination of the abdominal aorta and proximal common iliac arteries was performed to evaluate for aneurysm. Additional color and Doppler images of the distal aorta were obtained to document patency. COMPARISON:  None. FINDINGS: Abdominal aortic measurements as follows: Proximal:  2.4 x 2.6 cm (AP by transverse) Mid:         2.0 x 1.9 cm Distal:      1.8 x 2.0 cm Patent: Yes, peak systolic velocity is 324 cm/s  Aortoiliac bifurcation and  proximal iliac arteries are obscured by overlying bowel gas. IMPRESSION: Negative for abdominal aortic aneurysm. Electronically Signed   By: Genevie Ann M.D.   On: 11/19/2019 13:29   IR Fluoro Guide CV Line Right  Result Date: 11/20/2019 INDICATION: In need of placement of a tunneled hemodialysis catheter for the initiation of dialysis. EXAM: TUNNELED CENTRAL VENOUS HEMODIALYSIS CATHETER PLACEMENT WITH ULTRASOUND AND FLUOROSCOPIC GUIDANCE MEDICATIONS: Zosyn 3.375 g IV. The antibiotic was given in an appropriate time interval prior to skin puncture. ANESTHESIA/SEDATION: Moderate (conscious) sedation was employed during this procedure. A total of Versed 1 mg and Fentanyl 50 mcg was administered intravenously. Moderate Sedation Time: 15 minutes. The patient's level of consciousness and vital signs were monitored continuously by radiology nursing throughout the procedure under my direct supervision. FLUOROSCOPY TIME:  24 seconds (5 mGy). COMPLICATIONS: None immediate. PROCEDURE: Informed written consent was obtained from the patient after a discussion of the risks, benefits, and alternatives to treatment. Questions regarding the procedure were encouraged and answered. The right neck and chest were prepped with chlorhexidine in a sterile fashion, and a sterile drape was applied covering the operative field. Maximum barrier sterile technique with sterile gowns and gloves were used for the procedure. A timeout was performed prior to the initiation of the procedure. After creating a small venotomy incision, a micropuncture kit was utilized to access the internal jugular vein. Real-time ultrasound guidance was utilized for vascular access including the acquisition of a permanent ultrasound image documenting patency of the accessed vessel. The microwire was utilized to measure appropriate catheter length. A stiff Glidewire was advanced to the level of the IVC and the micropuncture sheath was exchanged for a peel-away sheath.  A palindrome tunneled hemodialysis catheter measuring 23 cm from tip to cuff was tunneled in a retrograde fashion from the anterior chest wall to the venotomy incision. The catheter was then placed through the peel-away sheath with tips ultimately positioned within the superior aspect of the right atrium. Final catheter positioning was confirmed and documented with a spot radiographic image. The catheter aspirates and flushes normally. The catheter was flushed with appropriate volume heparin dwells. The catheter exit site was secured with a 0-Prolene retention suture. The venotomy incision was closed with an interrupted 4-0 Vicryl, Dermabond and Steri-strips. Dressings were applied. The patient tolerated the procedure well without immediate post procedural complication. IMPRESSION: Successful placement of 23 cm tip to cuff tunneled hemodialysis catheter via the right internal jugular vein with tips terminating within the superior aspect of the right atrium. The catheter is ready for immediate use. Electronically Signed   By: Sandi Mariscal M.D.   On: 11/20/2019 10:53   IR US Guide Vasc Access Right  Result Date: 11/20/2019 INDICATION: In need of placement of a tunneled hemodialysis catheter for the initiation of dialysis. EXAM: TUNNELED CENTRAL VENOUS HEMODIALYSIS CATHETER PLACEMENT WITH ULTRASOUND AND FLUOROSCOPIC GUIDANCE MEDICATIONS: Zosyn 3.375 g IV. The antibiotic was given in an appropriate time interval prior to skin puncture. ANESTHESIA/SEDATION: Moderate (conscious) sedation was employed during this procedure. A total of Versed 1 mg and Fentanyl 50 mcg was administered intravenously. Moderate Sedation Time: 15 minutes. The patient's level of consciousness and vital signs were monitored continuously by radiology nursing throughout the procedure under my direct supervision. FLUOROSCOPY TIME:  24 seconds (5 mGy). COMPLICATIONS: None immediate. PROCEDURE: Informed written consent was obtained from the patient  after a discussion of the risks, benefits, and alternatives to treatment. Questions regarding the procedure were encouraged and  answered. The right neck and chest were prepped with chlorhexidine in a sterile fashion, and a sterile drape was applied covering the operative field. Maximum barrier sterile technique with sterile gowns and gloves were used for the procedure. A timeout was performed prior to the initiation of the procedure. After creating a small venotomy incision, a micropuncture kit was utilized to access the internal jugular vein. Real-time ultrasound guidance was utilized for vascular access including the acquisition of a permanent ultrasound image documenting patency of the accessed vessel. The microwire was utilized to measure appropriate catheter length. A stiff Glidewire was advanced to the level of the IVC and the micropuncture sheath was exchanged for a peel-away sheath. A palindrome tunneled hemodialysis catheter measuring 23 cm from tip to cuff was tunneled in a retrograde fashion from the anterior chest wall to the venotomy incision. The catheter was then placed through the peel-away sheath with tips ultimately positioned within the superior aspect of the right atrium. Final catheter positioning was confirmed and documented with a spot radiographic image. The catheter aspirates and flushes normally. The catheter was flushed with appropriate volume heparin dwells. The catheter exit site was secured with a 0-Prolene retention suture. The venotomy incision was closed with an interrupted 4-0 Vicryl, Dermabond and Steri-strips. Dressings were applied. The patient tolerated the procedure well without immediate post procedural complication. IMPRESSION: Successful placement of 23 cm tip to cuff tunneled hemodialysis catheter via the right internal jugular vein with tips terminating within the superior aspect of the right atrium. The catheter is ready for immediate use. Electronically Signed   By: Sandi Mariscal M.D.   On: 11/20/2019 10:53   US Abdomen Limited RUQ  Result Date: 11/19/2019 CLINICAL DATA:  Gallstones EXAM: ULTRASOUND ABDOMEN LIMITED RIGHT UPPER QUADRANT COMPARISON:  CT from same day FINDINGS: Gallbladder: There is gallbladder sludge within a distended gallbladder. There is mild gallbladder wall thickening measuring up to 3.5 mm. There is pericholecystic free fluid. No distinct gallstones are identified. The sonographic Percell Miller sign was reported as negative. Common bile duct: Diameter: 3 mm Liver: The liver parenchyma is coarsened and heterogeneous. The portal triads appear to be echogenic. Portal vein is patent on color Doppler imaging with normal direction of blood flow towards the liver. Other: The right kidney is echogenic. IMPRESSION: 1. Findings are equivocal for acute cholecystitis. While there is gallbladder wall thickening with pericholecystic free fluid in the presence of gallbladder sludge, the sonographic Murphy sign is negative. Consider further evaluation with HIDA scan as clinically indicated. 2. Coarsened and heterogeneous appearance of the liver with somewhat echogenic portal triads. Findings may indicate underlying hepatocellular disease or acute hepatitis. 3. Echogenic right kidney concerning for medical renal disease. Electronically Signed   By: Constance Holster M.D.   On: 11/19/2019 20:19    Anti-infectives: Anti-infectives (From admission, onward)   Start     Dose/Rate Route Frequency Ordered Stop   11/20/19 0800  piperacillin-tazobactam (ZOSYN) IVPB 2.25 g  Status:  Discontinued        2.25 g 100 mL/hr over 30 Minutes Intravenous Every 8 hours 11/19/19 2351 11/19/19 2357   11/20/19 0800  piperacillin-tazobactam (ZOSYN) IVPB 2.25 g        2.25 g 100 mL/hr over 30 Minutes Intravenous Every 8 hours 11/19/19 2357     11/20/19 0000  piperacillin-tazobactam (ZOSYN) IVPB 3.375 g  Status:  Discontinued        3.375 g 100 mL/hr over 30 Minutes Intravenous  Once 11/19/19  2351 11/19/19 2357  11/20/19 0000  piperacillin-tazobactam (ZOSYN) IVPB 3.375 g        3.375 g 100 mL/hr over 30 Minutes Intravenous  Once 11/19/19 2357 11/20/19 0045       Assessment/Plan Malignant HTN Acute renal failure - HD Elevated troponin Elevated BNP A flutter  Back pain ?Cholecystitis - u/s reports gallbladder wall thickening with pericholecystic free fluid in the presence of gallbladder sludge, the sonographic Murphy sign is negative - Patient has no abdominal pain today and is nontender on exam. Tolerating diet. I do not think he has cholecystitis. He does not need antibiotics from our standpoint.  General surgery will sign off, please call with concerns.  ID - zosyn 10/4>> FEN - renal diet VTE - SCDs, per primary Foley - none   LOS: 2 days    Brooke A Meuth, PA-C [facility] 11/21/2019, 9:31 AM Please see Amion for pager number during day hours 7:00am-4:30pm  

## 2019-11-21 NOTE — Progress Notes (Signed)
OT Cancellation Note  Patient Details Name: Samuel Burns MRN: [id number] DOB: 1985-05-09   Cancelled Treatment:    Reason Eval/Treat Not Completed: Patient at procedure or test/ unavailable- pt off unit at procedure. Will follow and see as able.    Jolaine Artist, OT Acute Rehabilitation Services Pager [phone] Office [phone]   Samuel Burns 11/21/2019, 1:27 PM

## 2019-11-21 NOTE — Progress Notes (Signed)
[facility] Progress Note   Subjective:   S/p HD #1 yesterday - tolerated well and says he feels a bit better.  Cardiology following for SVT - a flutter with RVR.  Objective Vitals:   11/21/19 0812 11/21/19 0912 11/21/19 0954 11/21/19 1209  BP: (!) 185/106 (!) 171/103 (!) 182/106 (!) 191/111  Pulse: 83 80 81 85  Resp:      Temp:      TempSrc:      SpO2:      Weight:      Height:       Physical Exam General: more alert than yesterday Heart: RRR, no rub Lungs: clear Abdomen: soft, nontender Extremities: no edema Dialysis Access:  [facility] c/d/i Neuro:  Conversant  Additional Objective Labs: Basic Metabolic Panel: Recent Labs  Lab 11/19/19 0906 11/20/19 0428 11/21/19 0018  NA 135 134* 137  K 3.7 3.7 3.3*  CL 89* 92* 95*  CO2 15* 13* 16*  GLUCOSE 131* 109* 102*  BUN 257* 266* 182*  CREATININE 34.21* 35.25* 23.62*  CALCIUM 9.5 8.7* 8.6*  PHOS  --  >30.0*  --    Liver Function Tests: Recent Labs  Lab 11/19/19 0906 11/20/19 0428 11/21/19 0018  AST 13*  --  9*  ALT 21  --  12  ALKPHOS 232*  --  146*  BILITOT 2.3*  --  2.4*  PROT 7.9  --  6.3*  ALBUMIN 4.5 3.6 3.4*   Recent Labs  Lab 11/19/19 0906  LIPASE 169*   CBC: Recent Labs  Lab 11/19/19 0906 11/19/19 0906 11/20/19 0428 11/20/19 0428 11/20/19 1951 11/21/19 0018 11/21/19 0712  WBC 13.7*   < > 9.5   < > 11.7* 11.8* 9.7  HGB 7.3*   < > 6.4*   < > 7.5* 7.1* 7.0*  HCT 21.8*   < > 19.1*   < > 21.9* 21.2* 21.0*  MCV 89.3  --  91.0  --  89.4 88.7 89.4  PLT 58*   < > 67*   < > 74* 69* 70*   < > = values in this interval not displayed.   Blood Culture    Component Value Date/Time   SDES BLOOD RIGHT HAND 11/20/2019 0008   SPECREQUEST  11/20/2019 0008    BOTTLES DRAWN AEROBIC AND ANAEROBIC Blood Culture adequate volume   CULT  11/20/2019 0008    NO GROWTH 1 DAY Performed at [facility], [place] [address] [place], [place] [address]    REPTSTATUS PENDING 11/20/2019 0008     Cardiac Enzymes: No results for input(s): CKTOTAL, CKMB, CKMBINDEX, TROPONINI in the last 168 hours. CBG: No results for input(s): GLUCAP in the last 168 hours. Iron Studies:  Recent Labs    11/20/19 0428  IRON 40*  TIBC 238*  FERRITIN 1,046*   '@lablastinr3' @ Studies/Results: CT ABDOMEN PELVIS WO CONTRAST  Result Date: 11/19/2019 CLINICAL DATA:  Right-sided thoracic back pain, nausea, vomiting, diarrhea, aortic dissection suspected EXAM: CT CHEST, ABDOMEN AND PELVIS WITHOUT CONTRAST TECHNIQUE: Multidetector CT imaging of the chest, abdomen and pelvis was performed following the standard protocol without IV contrast. COMPARISON:  None. FINDINGS: CT CHEST FINDINGS Cardiovascular: No significant vascular findings. Cardiomegaly. No pericardial effusion. Mediastinum/Nodes: No enlarged mediastinal, hilar, or axillary lymph nodes. Thyroid gland, trachea, and esophagus demonstrate no significant findings. Lungs/Pleura: Dependent bibasilar heterogeneous atelectasis or consolidation. No pleural effusion or pneumothorax. Musculoskeletal: No chest wall mass or suspicious bone lesions identified. CT ABDOMEN PELVIS FINDINGS Hepatobiliary: No solid  liver abnormality is seen. The gallbladder is distended with wall thickening (series 3, image 77). No radiopaque calculi identified. No biliary ductal dilatation Pancreas: Unremarkable. No pancreatic ductal dilatation or surrounding inflammatory changes. Spleen: Normal in size without significant abnormality. Adrenals/Urinary Tract: Adrenal glands are unremarkable. Kidneys are normal, without renal calculi, solid lesion, or hydronephrosis. Bladder is unremarkable. Stomach/Bowel: Stomach is within normal limits. Appendix appears normal. No evidence of bowel wall thickening, distention, or inflammatory changes. Vascular/Lymphatic: No significant vascular findings are present. No enlarged abdominal or pelvic lymph nodes. Reproductive: No mass or other abnormality.  Other: No abdominal wall hernia or abnormality. No abdominopelvic ascites. Musculoskeletal: No acute or significant osseous findings. IMPRESSION: 1. The gallbladder is distended with wall thickening. No radiopaque calculi identified. No biliary ductal dilatation. Findings are concerning for acute cholecystitis. 2. Predominantly dependent bibasilar heterogeneous atelectasis or consolidation. Findings are concerning for infection or aspiration. 3. No obvious evidence of aortic aneurysm or dissection, however please note that noncontrast CT is very limited for the evaluation of aortic dissection and other acute aortic pathology. 4. Cardiomegaly. Electronically Signed   By: Eddie Candle M.D.   On: 11/19/2019 14:38   CT CHEST WO CONTRAST  Result Date: 11/19/2019 CLINICAL DATA:  Right-sided thoracic back pain, nausea, vomiting, diarrhea, aortic dissection suspected EXAM: CT CHEST, ABDOMEN AND PELVIS WITHOUT CONTRAST TECHNIQUE: Multidetector CT imaging of the chest, abdomen and pelvis was performed following the standard protocol without IV contrast. COMPARISON:  None. FINDINGS: CT CHEST FINDINGS Cardiovascular: No significant vascular findings. Cardiomegaly. No pericardial effusion. Mediastinum/Nodes: No enlarged mediastinal, hilar, or axillary lymph nodes. Thyroid gland, trachea, and esophagus demonstrate no significant findings. Lungs/Pleura: Dependent bibasilar heterogeneous atelectasis or consolidation. No pleural effusion or pneumothorax. Musculoskeletal: No chest wall mass or suspicious bone lesions identified. CT ABDOMEN PELVIS FINDINGS Hepatobiliary: No solid liver abnormality is seen. The gallbladder is distended with wall thickening (series 3, image 77). No radiopaque calculi identified. No biliary ductal dilatation Pancreas: Unremarkable. No pancreatic ductal dilatation or surrounding inflammatory changes. Spleen: Normal in size without significant abnormality. Adrenals/Urinary Tract: Adrenal glands are  unremarkable. Kidneys are normal, without renal calculi, solid lesion, or hydronephrosis. Bladder is unremarkable. Stomach/Bowel: Stomach is within normal limits. Appendix appears normal. No evidence of bowel wall thickening, distention, or inflammatory changes. Vascular/Lymphatic: No significant vascular findings are present. No enlarged abdominal or pelvic lymph nodes. Reproductive: No mass or other abnormality. Other: No abdominal wall hernia or abnormality. No abdominopelvic ascites. Musculoskeletal: No acute or significant osseous findings. IMPRESSION: 1. The gallbladder is distended with wall thickening. No radiopaque calculi identified. No biliary ductal dilatation. Findings are concerning for acute cholecystitis. 2. Predominantly dependent bibasilar heterogeneous atelectasis or consolidation. Findings are concerning for infection or aspiration. 3. No obvious evidence of aortic aneurysm or dissection, however please note that noncontrast CT is very limited for the evaluation of aortic dissection and other acute aortic pathology. 4. Cardiomegaly. Electronically Signed   By: Eddie Candle M.D.   On: 11/19/2019 14:38   IR Fluoro Guide CV Line Right  Result Date: 11/20/2019 INDICATION: In need of placement of a tunneled hemodialysis catheter for the initiation of dialysis. EXAM: TUNNELED CENTRAL VENOUS HEMODIALYSIS CATHETER PLACEMENT WITH ULTRASOUND AND FLUOROSCOPIC GUIDANCE MEDICATIONS: Zosyn 3.375 g IV. The antibiotic was given in an appropriate time interval prior to skin puncture. ANESTHESIA/SEDATION: Moderate (conscious) sedation was employed during this procedure. A total of Versed 1 mg and Fentanyl 50 mcg was administered intravenously. Moderate Sedation Time: 15 minutes. The patient's level of consciousness  and vital signs were monitored continuously by radiology nursing throughout the procedure under my direct supervision. FLUOROSCOPY TIME:  24 seconds (5 mGy). COMPLICATIONS: None immediate.  PROCEDURE: Informed written consent was obtained from the patient after a discussion of the risks, benefits, and alternatives to treatment. Questions regarding the procedure were encouraged and answered. The right neck and chest were prepped with chlorhexidine in a sterile fashion, and a sterile drape was applied covering the operative field. Maximum barrier sterile technique with sterile gowns and gloves were used for the procedure. A timeout was performed prior to the initiation of the procedure. After creating a small venotomy incision, a micropuncture kit was utilized to access the internal jugular vein. Real-time ultrasound guidance was utilized for vascular access including the acquisition of a permanent ultrasound image documenting patency of the accessed vessel. The microwire was utilized to measure appropriate catheter length. A stiff Glidewire was advanced to the level of the IVC and the micropuncture sheath was exchanged for a peel-away sheath. A palindrome tunneled hemodialysis catheter measuring 23 cm from tip to cuff was tunneled in a retrograde fashion from the anterior chest wall to the venotomy incision. The catheter was then placed through the peel-away sheath with tips ultimately positioned within the superior aspect of the right atrium. Final catheter positioning was confirmed and documented with a spot radiographic image. The catheter aspirates and flushes normally. The catheter was flushed with appropriate volume heparin dwells. The catheter exit site was secured with a 0-Prolene retention suture. The venotomy incision was closed with an interrupted 4-0 Vicryl, Dermabond and Steri-strips. Dressings were applied. The patient tolerated the procedure well without immediate post procedural complication. IMPRESSION: Successful placement of 23 cm tip to cuff tunneled hemodialysis catheter via the right internal jugular vein with tips terminating within the superior aspect of the right atrium. The  catheter is ready for immediate use. Electronically Signed   By: Sandi Mariscal M.D.   On: 11/20/2019 10:53   IR US Guide Vasc Access Right  Result Date: 11/20/2019 INDICATION: In need of placement of a tunneled hemodialysis catheter for the initiation of dialysis. EXAM: TUNNELED CENTRAL VENOUS HEMODIALYSIS CATHETER PLACEMENT WITH ULTRASOUND AND FLUOROSCOPIC GUIDANCE MEDICATIONS: Zosyn 3.375 g IV. The antibiotic was given in an appropriate time interval prior to skin puncture. ANESTHESIA/SEDATION: Moderate (conscious) sedation was employed during this procedure. A total of Versed 1 mg and Fentanyl 50 mcg was administered intravenously. Moderate Sedation Time: 15 minutes. The patient's level of consciousness and vital signs were monitored continuously by radiology nursing throughout the procedure under my direct supervision. FLUOROSCOPY TIME:  24 seconds (5 mGy). COMPLICATIONS: None immediate. PROCEDURE: Informed written consent was obtained from the patient after a discussion of the risks, benefits, and alternatives to treatment. Questions regarding the procedure were encouraged and answered. The right neck and chest were prepped with chlorhexidine in a sterile fashion, and a sterile drape was applied covering the operative field. Maximum barrier sterile technique with sterile gowns and gloves were used for the procedure. A timeout was performed prior to the initiation of the procedure. After creating a small venotomy incision, a micropuncture kit was utilized to access the internal jugular vein. Real-time ultrasound guidance was utilized for vascular access including the acquisition of a permanent ultrasound image documenting patency of the accessed vessel. The microwire was utilized to measure appropriate catheter length. A stiff Glidewire was advanced to the level of the IVC and the micropuncture sheath was exchanged for a peel-away sheath. A palindrome tunneled hemodialysis  catheter measuring 23 cm from tip to  cuff was tunneled in a retrograde fashion from the anterior chest wall to the venotomy incision. The catheter was then placed through the peel-away sheath with tips ultimately positioned within the superior aspect of the right atrium. Final catheter positioning was confirmed and documented with a spot radiographic image. The catheter aspirates and flushes normally. The catheter was flushed with appropriate volume heparin dwells. The catheter exit site was secured with a 0-Prolene retention suture. The venotomy incision was closed with an interrupted 4-0 Vicryl, Dermabond and Steri-strips. Dressings were applied. The patient tolerated the procedure well without immediate post procedural complication. IMPRESSION: Successful placement of 23 cm tip to cuff tunneled hemodialysis catheter via the right internal jugular vein with tips terminating within the superior aspect of the right atrium. The catheter is ready for immediate use. Electronically Signed   By: Sandi Mariscal M.D.   On: 11/20/2019 10:53   ECHOCARDIOGRAM COMPLETE  Result Date: 11/21/2019    ECHOCARDIOGRAM REPORT   Patient Name:   Samuel Burns Date of Exam: 11/21/2019 Medical Rec #:  [id number]     Height:       67.0 in Accession #:    [id number]    Weight:       178.4 lb Date of Birth:  [birth year]    BSA:          1.926 m Patient Age:    33 years      BP:           186/106 mmHg Patient Gender: M             HR:           81 bpm. Exam Location:  Inpatient Procedure: 2D Echo and Intracardiac Opacification Agent Indications:    Dyspnea 786.09 / R06.00  History:        Patient has no prior history of Echocardiogram examinations.                 Acute renal failure, Hypertensive emergency, Elevated troponin,                 Elevated BNP, Normocytic anemia, Thrombocytopenia.  Sonographer:    Darlina Sicilian RDCS Referring Phys: [place]  1. Left ventricular ejection fraction, by estimation, is 50 to 55%. The left ventricle has low normal  function. The left ventricle has no regional wall motion abnormalities. There is severe concentric left ventricular hypertrophy. Left ventricular diastolic parameters are consistent with Grade II diastolic dysfunction (pseudonormalization). Elevated left ventricular end-diastolic pressure.  2. Right ventricular systolic function is normal. The right ventricular size is normal. There is normal pulmonary artery systolic pressure.  3. Left atrial size was mildly dilated.  4. Moderate pericardial effusion. The pericardial effusion is circumferential. There is no evidence of cardiac tamponade.  5. The mitral valve is normal in structure. Trivial mitral valve regurgitation. No evidence of mitral stenosis.  6. The aortic valve is tricuspid. Aortic valve regurgitation is not visualized. No aortic stenosis is present.  7. The inferior vena cava is normal in size with greater than 50% respiratory variability, suggesting right atrial pressure of 3 mmHg. FINDINGS  Left Ventricle: Left ventricular ejection fraction, by estimation, is 50 to 55%. The left ventricle has low normal function. The left ventricle has no regional wall motion abnormalities. Definity contrast agent was given IV to delineate the left ventricular endocardial borders. The left ventricular internal cavity size was normal in size. There  is severe concentric left ventricular hypertrophy. Left ventricular diastolic parameters are consistent with Grade II diastolic dysfunction (pseudonormalization). Elevated left ventricular end-diastolic pressure. Right Ventricle: The right ventricular size is normal. No increase in right ventricular wall thickness. Right ventricular systolic function is normal. There is normal pulmonary artery systolic pressure. The tricuspid regurgitant velocity is 2.09 m/s, and  with an assumed right atrial pressure of 3 mmHg, the estimated right ventricular systolic pressure is 22.2 mmHg. Left Atrium: Left atrial size was mildly dilated.  Right Atrium: Right atrial size was normal in size. Pericardium: A moderately sized pericardial effusion is present. The pericardial effusion is circumferential. There is no evidence of cardiac tamponade. Mitral Valve: The mitral valve is normal in structure. Trivial mitral valve regurgitation. No evidence of mitral valve stenosis. Tricuspid Valve: The tricuspid valve is normal in structure. Tricuspid valve regurgitation is trivial. No evidence of tricuspid stenosis. Aortic Valve: The aortic valve is tricuspid. Aortic valve regurgitation is not visualized. No aortic stenosis is present. Pulmonic Valve: The pulmonic valve was normal in structure. Pulmonic valve regurgitation is not visualized. No evidence of pulmonic stenosis. Aorta: The aortic root is normal in size and structure. Venous: The inferior vena cava is normal in size with greater than 50% respiratory variability, suggesting right atrial pressure of 3 mmHg. IAS/Shunts: No atrial level shunt detected by color flow Doppler.  LEFT VENTRICLE PLAX 2D LVIDd:         5.50 cm      Diastology LVIDs:         4.30 cm      LV e' medial:    4.57 cm/s LV PW:         1.90 cm      LV E/e' medial:  28.0 LV IVS:        1.90 cm      LV e' lateral:   3.48 cm/s LVOT diam:     2.10 cm      LV E/e' lateral: 36.8 LV SV:         82 LV SV Index:   43 LVOT Area:     3.46 cm  LV Volumes (MOD) LV vol d, MOD A2C: 273.0 ml LV vol d, MOD A4C: 255.0 ml LV vol s, MOD A2C: 136.0 ml LV vol s, MOD A4C: 131.0 ml LV SV MOD A2C:     137.0 ml LV SV MOD A4C:     255.0 ml LV SV MOD BP:      129.1 ml RIGHT VENTRICLE RV S prime:     20.20 cm/s TAPSE (M-mode): 2.3 cm LEFT ATRIUM              Index       RIGHT ATRIUM           Index LA diam:        3.60 cm  1.87 cm/m  RA Area:     18.90 cm LA Vol (A2C):   95.3 ml  49.48 ml/m RA Volume:   53.10 ml  27.57 ml/m LA Vol (A4C):   105.0 ml 54.51 ml/m LA Biplane Vol: 104.0 ml 53.99 ml/m  AORTIC VALVE LVOT Vmax:   151.00 cm/s LVOT Vmean:  103.000 cm/s  LVOT VTI:    0.237 m  AORTA Ao Root diam: 3.50 cm Ao Asc diam:  3.20 cm MITRAL VALVE                TRICUSPID VALVE MV Area (PHT): 3.07 cm  TR Peak grad:   17.5 mmHg MV Decel Time: 247 msec     TR Vmax:        209.00 cm/s MV E velocity: 128.00 cm/s MV A velocity: 100.00 cm/s  SHUNTS MV E/A ratio:  1.28         Systemic VTI:  0.24 m                             Systemic Diam: 2.10 cm Skeet Latch MD Electronically signed by Skeet Latch MD Signature Date/Time: 11/21/2019/12:09:06 PM    Final    US Abdomen Limited RUQ  Result Date: 11/19/2019 CLINICAL DATA:  Gallstones EXAM: ULTRASOUND ABDOMEN LIMITED RIGHT UPPER QUADRANT COMPARISON:  CT from same day FINDINGS: Gallbladder: There is gallbladder sludge within a distended gallbladder. There is mild gallbladder wall thickening measuring up to 3.5 mm. There is pericholecystic free fluid. No distinct gallstones are identified. The sonographic Percell Miller sign was reported as negative. Common bile duct: Diameter: 3 mm Liver: The liver parenchyma is coarsened and heterogeneous. The portal triads appear to be echogenic. Portal vein is patent on color Doppler imaging with normal direction of blood flow towards the liver. Other: The right kidney is echogenic. IMPRESSION: 1. Findings are equivocal for acute cholecystitis. While there is gallbladder wall thickening with pericholecystic free fluid in the presence of gallbladder sludge, the sonographic Murphy sign is negative. Consider further evaluation with HIDA scan as clinically indicated. 2. Coarsened and heterogeneous appearance of the liver with somewhat echogenic portal triads. Findings may indicate underlying hepatocellular disease or acute hepatitis. 3. Echogenic right kidney concerning for medical renal disease. Electronically Signed   By: Constance Holster M.D.   On: 11/19/2019 20:19   Medications:  . sodium chloride   Intravenous Once  . amLODipine  10 mg Oral Daily  . Chlorhexidine Gluconate Cloth  6  each Topical Q0600  . [START ON 11/22/2019] influenza vac split quadrivalent PF  0.5 mL Intramuscular Tomorrow-1000  . melatonin  5 mg Oral QHS  . metoprolol tartrate  25 mg Oral Q6H  . sodium bicarbonate  1,300 mg Oral TID    Assessment/Plan: 1.  Acute kidney injury versus end-stage renal disease: The available data points to end-stage renal disease.  s/p IR TDC today with HD #1 Mon , #2 Tues, #3 Wed planned.  Vein map and place AV access here if able. 2.  Hypertensive urgency: BPs up more - add losartan 25 daily, I think targeting a BP in the 160 range is reasonable and we can achieve better control over the coming weeks.   3.  Anemia: Likely anemia of chronic kidney disease but pending hemolysis w/u.  Initiate ESA, ferritin > 1000, hold IV iron.  1u pRBC 10/4. 4.  Secondary hyperparathyroidism: Screen for secondary hyperparathyroidism with PTH.  His calcium level is within acceptable range.  Phos measured at > 30, will recheck. 5.  Elevated troponin levels: Cardiology following, not thought to be ACS. 6.  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  A hematology consult could be considered - discussed with primary team.  7.  Metabolic acidosis, Anion Gap:  Likely secondary to CKD, will improve with HD.  Start po bicarb for now given low intensity HD to start.  D/c when at full HD.  Jannifer Hick MD 11/21/2019, 1:41 PM  [facility] Pager: [phone]

## 2019-11-21 NOTE — Progress Notes (Signed)
Progress Note  Patient Name: Samuel Burns Date of Encounter: 11/21/2019  [place] Cardiologist: No primary care provider on file. new  Patient Profile     Samuel Burns is a 34 y.o. male with a hx of asthma, HTN now admitted 11/19/19 with thracic back pain for 3 days, Nausea for 2 weeks, vomiting and diarrhea and no BP meds in 3 weeks, BP on arrival 248/137 P 94, R 16 a febrile, Cr 34.21 BUN 257  who is being seen today for the evaluation of atrial flutter at the request of Dr. Ardelia Mems. -> Noted to be in hypertensive crisis with blood pressures 248/137 mmHg.  Also developed atrial flutter with RVR.  Was profoundly anemic and has received 1 unit PRBC. Hospital course to date: -> Tunneled catheter placed on 10/4 -> finally underwent HD yesterday evening.;  At least 1 unit PRBC administered along with FFP for oozing.  Subjective   Abdominal pain better.  Back pain being treated.  Sleeping soundly when I walked in-had a long night.   Seems to be relatively comfortable.  Pain seems to be relieved.  Not eating much.  Inpatient Medications    Scheduled Meds: . sodium chloride   Intravenous Once  . amLODipine  10 mg Oral Daily  . Chlorhexidine Gluconate Cloth  6 each Topical Q0600  . [START ON 11/22/2019] influenza vac split quadrivalent PF  0.5 mL Intramuscular Tomorrow-1000  . melatonin  5 mg Oral QHS  . metoprolol tartrate  25 mg Oral Q6H  . sodium bicarbonate  1,300 mg Oral TID   Continuous Infusions:  PRN Meds: acetaminophen **OR** acetaminophen, HYDROmorphone (DILAUDID) injection, labetalol, lip balm, polyethylene glycol   Vital Signs    Vitals:   11/21/19 0810 11/21/19 0812 11/21/19 0912 11/21/19 0954  BP: (!) 192/110 (!) 185/106 (!) 171/103 (!) 182/106  Pulse: 83 83 80 81  Resp: 17     Temp: 98 F (36.7 C)     TempSrc: Oral     SpO2: 97%     Weight:      Height:        Intake/Output Summary (Last 24 hours) at 11/21/2019 1046 Last data filed at 11/21/2019  0411 Gross per 24 hour  Intake 655.5 ml  Output 0 ml  Net 655.5 ml   Last 3 Weights 11/20/2019 11/20/2019 04/08/2017  Weight (lbs) 178 lb 5.6 oz 178 lb 5.6 oz 239 lb 6.4 oz  Weight (kg) 80.9 kg 80.9 kg 108.591 kg      Telemetry    Currently in sinus rhythm with rates of 70s.- Personally Reviewed  ECG    Not checked- Personally Reviewed  Physical Exam   Physical Exam Vitals and nursing note reviewed.  Constitutional:      General: He is not in acute distress.    Appearance: Normal appearance.     Comments: Sleeping soundly  Cardiovascular:     Rate and Rhythm: Normal rate.     Pulses: Normal pulses.     Heart sounds: Murmur (1/6sem) heard.  No friction rub. Gallop (S4) present.   Pulmonary:     Effort: Pulmonary effort is normal. No respiratory distress.     Breath sounds: Normal breath sounds.  Musculoskeletal:        General: No swelling. Normal range of motion.  Neurological:     General: No focal deficit present.     Mental Status: He is alert and oriented to person, place, and time.     Labs  High Sensitivity Troponin:   Recent Labs  Lab 11/20/19 1633 11/20/19 1951 11/20/19 2110 11/21/19 0447 11/21/19 0712  TROPONINIHS 1,368* 1,453* 1,715* 2,078* 2,024*      Chemistry Recent Labs  Lab 11/19/19 0906 11/20/19 0428 11/21/19 0018  NA 135 134* 137  K 3.7 3.7 3.3*  CL 89* 92* 95*  CO2 15* 13* 16*  GLUCOSE 131* 109* 102*  BUN 257* 266* 182*  CREATININE 34.21* 35.25* 23.62*  CALCIUM 9.5 8.7* 8.6*  PROT 7.9  --  6.3*  ALBUMIN 4.5 3.6 3.4*  AST 13*  --  9*  ALT 21  --  12  ALKPHOS 232*  --  146*  BILITOT 2.3*  --  2.4*  GFRNONAA 1* 1* 2*  GFRAA 2* 2* 2*  ANIONGAP 31* 29* 26*     Hematology Recent Labs  Lab 11/20/19 1951 11/21/19 0018 11/21/19 0712  WBC 11.7* 11.8* 9.7  RBC 2.45* 2.39* 2.35*  HGB 7.5* 7.1* 7.0*  HCT 21.9* 21.2* 21.0*  MCV 89.4 88.7 89.4  MCH 30.6 29.7 29.8  MCHC 34.2 33.5 33.3  RDW 17.4* 17.5* 18.6*  PLT 74* 69*  70*    BNP Recent Labs  Lab 11/19/19 1253  BNP 1,267.0*     DDimer  Recent Labs  Lab 11/20/19 1105  DDIMER 4.07*     Radiology    CT ABDOMEN PELVIS WO CONTRAST  Result Date: 11/19/2019 CLINICAL DATA:  Right-sided thoracic back pain, nausea, vomiting, diarrhea, aortic dissection suspected EXAM: CT CHEST, ABDOMEN AND PELVIS WITHOUT CONTRAST TECHNIQUE: Multidetector CT imaging of the chest, abdomen and pelvis was performed following the standard protocol without IV contrast. COMPARISON:  None. FINDINGS: CT CHEST FINDINGS Cardiovascular: No significant vascular findings. Cardiomegaly. No pericardial effusion. Mediastinum/Nodes: No enlarged mediastinal, hilar, or axillary lymph nodes. Thyroid gland, trachea, and esophagus demonstrate no significant findings. Lungs/Pleura: Dependent bibasilar heterogeneous atelectasis or consolidation. No pleural effusion or pneumothorax. Musculoskeletal: No chest wall mass or suspicious bone lesions identified. CT ABDOMEN PELVIS FINDINGS Hepatobiliary: No solid liver abnormality is seen. The gallbladder is distended with wall thickening (series 3, image 77). No radiopaque calculi identified. No biliary ductal dilatation Pancreas: Unremarkable. No pancreatic ductal dilatation or surrounding inflammatory changes. Spleen: Normal in size without significant abnormality. Adrenals/Urinary Tract: Adrenal glands are unremarkable. Kidneys are normal, without renal calculi, solid lesion, or hydronephrosis. Bladder is unremarkable. Stomach/Bowel: Stomach is within normal limits. Appendix appears normal. No evidence of bowel wall thickening, distention, or inflammatory changes. Vascular/Lymphatic: No significant vascular findings are present. No enlarged abdominal or pelvic lymph nodes. Reproductive: No mass or other abnormality. Other: No abdominal wall hernia or abnormality. No abdominopelvic ascites. Musculoskeletal: No acute or significant osseous findings. IMPRESSION: 1.  The gallbladder is distended with wall thickening. No radiopaque calculi identified. No biliary ductal dilatation. Findings are concerning for acute cholecystitis. 2. Predominantly dependent bibasilar heterogeneous atelectasis or consolidation. Findings are concerning for infection or aspiration. 3. No obvious evidence of aortic aneurysm or dissection, however please note that noncontrast CT is very limited for the evaluation of aortic dissection and other acute aortic pathology. 4. Cardiomegaly. Electronically Signed   By: Eddie Candle M.D.   On: 11/19/2019 14:38   DG Chest 2 View  Result Date: 11/19/2019 CLINICAL DATA:  Right back pain EXAM: CHEST - 2 VIEW COMPARISON:  None. FINDINGS: Lungs are clear.  No pleural effusion or pneumothorax. Cardiomegaly. Visualized osseous structures are within normal limits. IMPRESSION: No evidence of acute cardiopulmonary disease. Electronically Signed   By: Bertis Ruddy  [place] Pink M.D.   On: 11/19/2019 13:19   CT CHEST WO CONTRAST  Result Date: 11/19/2019 CLINICAL DATA:  Right-sided thoracic back pain, nausea, vomiting, diarrhea, aortic dissection suspected EXAM: CT CHEST, ABDOMEN AND PELVIS WITHOUT CONTRAST TECHNIQUE: Multidetector CT imaging of the chest, abdomen and pelvis was performed following the standard protocol without IV contrast. COMPARISON:  None. FINDINGS: CT CHEST FINDINGS Cardiovascular: No significant vascular findings. Cardiomegaly. No pericardial effusion. Mediastinum/Nodes: No enlarged mediastinal, hilar, or axillary lymph nodes. Thyroid gland, trachea, and esophagus demonstrate no significant findings. Lungs/Pleura: Dependent bibasilar heterogeneous atelectasis or consolidation. No pleural effusion or pneumothorax. Musculoskeletal: No chest wall mass or suspicious bone lesions identified. CT ABDOMEN PELVIS FINDINGS Hepatobiliary: No solid liver abnormality is seen. The gallbladder is distended with wall thickening (series 3, image 77). No radiopaque calculi  identified. No biliary ductal dilatation Pancreas: Unremarkable. No pancreatic ductal dilatation or surrounding inflammatory changes. Spleen: Normal in size without significant abnormality. Adrenals/Urinary Tract: Adrenal glands are unremarkable. Kidneys are normal, without renal calculi, solid lesion, or hydronephrosis. Bladder is unremarkable. Stomach/Bowel: Stomach is within normal limits. Appendix appears normal. No evidence of bowel wall thickening, distention, or inflammatory changes. Vascular/Lymphatic: No significant vascular findings are present. No enlarged abdominal or pelvic lymph nodes. Reproductive: No mass or other abnormality. Other: No abdominal wall hernia or abnormality. No abdominopelvic ascites. Musculoskeletal: No acute or significant osseous findings. IMPRESSION: 1. The gallbladder is distended with wall thickening. No radiopaque calculi identified. No biliary ductal dilatation. Findings are concerning for acute cholecystitis. 2. Predominantly dependent bibasilar heterogeneous atelectasis or consolidation. Findings are concerning for infection or aspiration. 3. No obvious evidence of aortic aneurysm or dissection, however please note that noncontrast CT is very limited for the evaluation of aortic dissection and other acute aortic pathology. 4. Cardiomegaly. Electronically Signed   By: Eddie Candle M.D.   On: 11/19/2019 14:38   US Aorta  Result Date: 11/19/2019 CLINICAL DATA:  34 year old male with hypertensive emergency and back pain for 3 days. EXAM: ULTRASOUND OF ABDOMINAL AORTA TECHNIQUE: Ultrasound examination of the abdominal aorta and proximal common iliac arteries was performed to evaluate for aneurysm. Additional color and Doppler images of the distal aorta were obtained to document patency. COMPARISON:  None. FINDINGS: Abdominal aortic measurements as follows: Proximal:  2.4 x 2.6 cm (AP by transverse) Mid:         2.0 x 1.9 cm Distal:      1.8 x 2.0 cm Patent: Yes, peak systolic  velocity is 854 cm/s Aortoiliac bifurcation and proximal iliac arteries are obscured by overlying bowel gas. IMPRESSION: Negative for abdominal aortic aneurysm. Electronically Signed   By: Genevie Ann M.D.   On: 11/19/2019 13:29   IR Fluoro Guide CV Line Right  Result Date: 11/20/2019 INDICATION: In need of placement of a tunneled hemodialysis catheter for the initiation of dialysis. EXAM: TUNNELED CENTRAL VENOUS HEMODIALYSIS CATHETER PLACEMENT WITH ULTRASOUND AND FLUOROSCOPIC GUIDANCE MEDICATIONS: Zosyn 3.375 g IV. The antibiotic was given in an appropriate time interval prior to skin puncture. ANESTHESIA/SEDATION: Moderate (conscious) sedation was employed during this procedure. A total of Versed 1 mg and Fentanyl 50 mcg was administered intravenously. Moderate Sedation Time: 15 minutes. The patient's level of consciousness and vital signs were monitored continuously by radiology nursing throughout the procedure under my direct supervision. FLUOROSCOPY TIME:  24 seconds (5 mGy). COMPLICATIONS: None immediate. PROCEDURE: Informed written consent was obtained from the patient after a discussion of the risks, benefits, and alternatives to treatment. Questions regarding the procedure were  encouraged and answered. The right neck and chest were prepped with chlorhexidine in a sterile fashion, and a sterile drape was applied covering the operative field. Maximum barrier sterile technique with sterile gowns and gloves were used for the procedure. A timeout was performed prior to the initiation of the procedure. After creating a small venotomy incision, a micropuncture kit was utilized to access the internal jugular vein. Real-time ultrasound guidance was utilized for vascular access including the acquisition of a permanent ultrasound image documenting patency of the accessed vessel. The microwire was utilized to measure appropriate catheter length. A stiff Glidewire was advanced to the level of the IVC and the  micropuncture sheath was exchanged for a peel-away sheath. A palindrome tunneled hemodialysis catheter measuring 23 cm from tip to cuff was tunneled in a retrograde fashion from the anterior chest wall to the venotomy incision. The catheter was then placed through the peel-away sheath with tips ultimately positioned within the superior aspect of the right atrium. Final catheter positioning was confirmed and documented with a spot radiographic image. The catheter aspirates and flushes normally. The catheter was flushed with appropriate volume heparin dwells. The catheter exit site was secured with a 0-Prolene retention suture. The venotomy incision was closed with an interrupted 4-0 Vicryl, Dermabond and Steri-strips. Dressings were applied. The patient tolerated the procedure well without immediate post procedural complication. IMPRESSION: Successful placement of 23 cm tip to cuff tunneled hemodialysis catheter via the right internal jugular vein with tips terminating within the superior aspect of the right atrium. The catheter is ready for immediate use. Electronically Signed   By: Sandi Mariscal M.D.   On: 11/20/2019 10:53   IR US Guide Vasc Access Right  Result Date: 11/20/2019 INDICATION: In need of placement of a tunneled hemodialysis catheter for the initiation of dialysis. EXAM: TUNNELED CENTRAL VENOUS HEMODIALYSIS CATHETER PLACEMENT WITH ULTRASOUND AND FLUOROSCOPIC GUIDANCE MEDICATIONS: Zosyn 3.375 g IV. The antibiotic was given in an appropriate time interval prior to skin puncture. ANESTHESIA/SEDATION: Moderate (conscious) sedation was employed during this procedure. A total of Versed 1 mg and Fentanyl 50 mcg was administered intravenously. Moderate Sedation Time: 15 minutes. The patient's level of consciousness and vital signs were monitored continuously by radiology nursing throughout the procedure under my direct supervision. FLUOROSCOPY TIME:  24 seconds (5 mGy). COMPLICATIONS: None immediate.  PROCEDURE: Informed written consent was obtained from the patient after a discussion of the risks, benefits, and alternatives to treatment. Questions regarding the procedure were encouraged and answered. The right neck and chest were prepped with chlorhexidine in a sterile fashion, and a sterile drape was applied covering the operative field. Maximum barrier sterile technique with sterile gowns and gloves were used for the procedure. A timeout was performed prior to the initiation of the procedure. After creating a small venotomy incision, a micropuncture kit was utilized to access the internal jugular vein. Real-time ultrasound guidance was utilized for vascular access including the acquisition of a permanent ultrasound image documenting patency of the accessed vessel. The microwire was utilized to measure appropriate catheter length. A stiff Glidewire was advanced to the level of the IVC and the micropuncture sheath was exchanged for a peel-away sheath. A palindrome tunneled hemodialysis catheter measuring 23 cm from tip to cuff was tunneled in a retrograde fashion from the anterior chest wall to the venotomy incision. The catheter was then placed through the peel-away sheath with tips ultimately positioned within the superior aspect of the right atrium. Final catheter positioning was confirmed and documented  with a spot radiographic image. The catheter aspirates and flushes normally. The catheter was flushed with appropriate volume heparin dwells. The catheter exit site was secured with a 0-Prolene retention suture. The venotomy incision was closed with an interrupted 4-0 Vicryl, Dermabond and Steri-strips. Dressings were applied. The patient tolerated the procedure well without immediate post procedural complication. IMPRESSION: Successful placement of 23 cm tip to cuff tunneled hemodialysis catheter via the right internal jugular vein with tips terminating within the superior aspect of the right atrium. The  catheter is ready for immediate use. Electronically Signed   By: Sandi Mariscal M.D.   On: 11/20/2019 10:53   US Abdomen Limited RUQ  Result Date: 11/19/2019 CLINICAL DATA:  Gallstones EXAM: ULTRASOUND ABDOMEN LIMITED RIGHT UPPER QUADRANT COMPARISON:  CT from same day FINDINGS: Gallbladder: There is gallbladder sludge within a distended gallbladder. There is mild gallbladder wall thickening measuring up to 3.5 mm. There is pericholecystic free fluid. No distinct gallstones are identified. The sonographic Percell Miller sign was reported as negative. Common bile duct: Diameter: 3 mm Liver: The liver parenchyma is coarsened and heterogeneous. The portal triads appear to be echogenic. Portal vein is patent on color Doppler imaging with normal direction of blood flow towards the liver. Other: The right kidney is echogenic. IMPRESSION: 1. Findings are equivocal for acute cholecystitis. While there is gallbladder wall thickening with pericholecystic free fluid in the presence of gallbladder sludge, the sonographic Murphy sign is negative. Consider further evaluation with HIDA scan as clinically indicated. 2. Coarsened and heterogeneous appearance of the liver with somewhat echogenic portal triads. Findings may indicate underlying hepatocellular disease or acute hepatitis. 3. Echogenic right kidney concerning for medical renal disease. Electronically Signed   By: Constance Holster M.D.   On: 11/19/2019 20:19    Cardiac Studies   Echocardiogram apparently has been done, but not yet read.  I am not able to review images -> ?  Question if there is a problem with upload.  Assessment & Plan    Atrial Flutter with RVR: Rate now improved after recurrent flutter yesterday -  For better rate control converted to Lopressor 25 mg q 6 hr -- can potentially increase for more blood pressure.  We will change to 50 mg every 8 hours.  For as needed blood pressure, I have changed the labetalol to allow for as needed dosing.  He has  actually received some.  Hypertensive crisis (emergency)/accelerated hypertension -> pressure still remain elevated.  No longer on IV medicines.  Being managed by primary team and nephrology.  Currently on amlodipine 10 mg daily.  Will increase metoprolol to 50 mg every 8 hr -> and potentially to 100 mg twice daily tomorrow  As needed labetalol and hydralazine written for (preferably use labetalol)  Elevated troponins/demand ischemia.  The setting of extreme hypertension and SVT, quite likely demand ischemia.  This goes along with elevated BNP.  Not having any heart failure symptoms.  Would not treat with IV Lasix as he is not having chest pain.  Clinically does not sound like ACS.  2D echo pending -> apparently in the process of being done now.  With his anemia, I would be reluctant to do any anticoagulation especially to assure he is not bleeding.  He may eventually require ischemic evaluation once stabilized, but echocardiogram would be very helpful first.     For questions or updates, please contact [place] Please consult [email or web address] for contact info under        Signed, Shanon Brow  Ellyn Hack, MD  11/21/2019, 10:46 AM

## 2019-11-21 NOTE — Progress Notes (Signed)
  Echocardiogram 2D Echocardiogram with definity has been performed.  Darlina Sicilian M 11/21/2019, 11:04 AM

## 2019-11-22 ENCOUNTER — Encounter (HOSPITAL_COMMUNITY): Payer: Self-pay

## 2019-11-22 ENCOUNTER — Encounter (HOSPITAL_COMMUNITY): Payer: Self-pay | Admitting: Family Medicine

## 2019-11-22 DIAGNOSIS — D696 Thrombocytopenia, unspecified: Secondary | ICD-10-CM

## 2019-11-22 DIAGNOSIS — M311 Thrombotic microangiopathy, unspecified: Secondary | ICD-10-CM

## 2019-11-22 DIAGNOSIS — D649 Anemia, unspecified: Secondary | ICD-10-CM

## 2019-11-22 LAB — CBC
HCT: 25.1 % — ABNORMAL LOW (ref 39.0–52.0)
Hemoglobin: 8.2 g/dL — ABNORMAL LOW (ref 13.0–17.0)
MCH: 29.9 pg (ref 26.0–34.0)
MCHC: 32.7 g/dL (ref 30.0–36.0)
MCV: 91.6 fL (ref 80.0–100.0)
Platelets: 68 10*3/uL — ABNORMAL LOW (ref 150–400)
RBC: 2.74 MIL/uL — ABNORMAL LOW (ref 4.22–5.81)
RDW: 19.5 % — ABNORMAL HIGH (ref 11.5–15.5)
WBC: 10.1 10*3/uL (ref 4.0–10.5)
nRBC: 0.4 % — ABNORMAL HIGH (ref 0.0–0.2)

## 2019-11-22 LAB — COMPREHENSIVE METABOLIC PANEL WITH GFR
ALT: 14 U/L (ref 0–44)
AST: 15 U/L (ref 15–41)
Albumin: 3.7 g/dL (ref 3.5–5.0)
Alkaline Phosphatase: 167 U/L — ABNORMAL HIGH (ref 38–126)
Anion gap: 21 — ABNORMAL HIGH (ref 5–15)
BUN: 118 mg/dL — ABNORMAL HIGH (ref 6–20)
CO2: 18 mmol/L — ABNORMAL LOW (ref 22–32)
Calcium: 8.9 mg/dL (ref 8.9–10.3)
Chloride: 97 mmol/L — ABNORMAL LOW (ref 98–111)
Creatinine, Ser: 17.79 mg/dL — ABNORMAL HIGH (ref 0.61–1.24)
GFR calc non Af Amer: 3 mL/min — ABNORMAL LOW
Glucose, Bld: 89 mg/dL (ref 70–99)
Potassium: 3.4 mmol/L — ABNORMAL LOW (ref 3.5–5.1)
Sodium: 136 mmol/L (ref 135–145)
Total Bilirubin: 2.7 mg/dL — ABNORMAL HIGH (ref 0.3–1.2)
Total Protein: 7.2 g/dL (ref 6.5–8.1)

## 2019-11-22 LAB — ADAMTS13 ACTIVITY: Adamts 13 Activity: 59.5 % — ABNORMAL LOW (ref >66.8)

## 2019-11-22 LAB — PHOSPHORUS: Phosphorus: 8.5 mg/dL — ABNORMAL HIGH (ref 2.5–4.6)

## 2019-11-22 LAB — HEPATITIS B SURFACE ANTIBODY, QUANTITATIVE: Hep B S AB Quant (Post): 265.3 m[IU]/mL (ref >9.9)

## 2019-11-22 LAB — ADAMTS13 ACTIVITY REFLEX

## 2019-11-22 MED ORDER — LIDOCAINE-PRILOCAINE 2.5-2.5 % EX CREA: 1.0000 "application " | TOPICAL_CREAM | CUTANEOUS | Status: DC | PRN

## 2019-11-22 MED ORDER — PENTAFLUOROPROP-TETRAFLUOROETH EX AERO: 1.0000 "application " | INHALATION_SPRAY | CUTANEOUS | Status: DC | PRN

## 2019-11-22 MED ORDER — DARBEPOETIN ALFA 60 MCG/0.3ML IJ SOSY
60.0000 ug | PREFILLED_SYRINGE | INTRAMUSCULAR | Status: DC
  Administered 2019-11-22: 60 ug via INTRAVENOUS

## 2019-11-22 MED ORDER — SODIUM CHLORIDE 0.9 % IV SOLN: 100.0000 mL | INTRAVENOUS | Status: DC | PRN

## 2019-11-22 MED ORDER — LIDOCAINE HCL (PF) 1 % IJ SOLN: 5.0000 mL | INTRAMUSCULAR | Status: DC | PRN

## 2019-11-22 MED ORDER — DARBEPOETIN ALFA 60 MCG/0.3ML IJ SOSY
PREFILLED_SYRINGE | INTRAMUSCULAR | Status: AC
  Filled 2019-11-22: qty 0.3

## 2019-11-22 MED ORDER — POTASSIUM CHLORIDE CRYS ER 20 MEQ PO TBCR: 40.0000 meq | EXTENDED_RELEASE_TABLET | Freq: Once | ORAL | Status: DC

## 2019-11-22 MED ORDER — SEVELAMER CARBONATE 800 MG PO TABS
800.0000 mg | ORAL_TABLET | Freq: Three times a day (TID) | ORAL | Status: DC
  Administered 2019-11-22 – 2019-11-27 (×12): 800 mg via ORAL
  Filled 2019-11-22 (×12): qty 1

## 2019-11-22 MED ORDER — HEPARIN SODIUM (PORCINE) 1000 UNIT/ML IJ SOLN
INTRAMUSCULAR | Status: AC
  Filled 2019-11-22: qty 4

## 2019-11-22 MED ORDER — HEPARIN SODIUM (PORCINE) 1000 UNIT/ML DIALYSIS: 1000.0000 [IU] | INTRAMUSCULAR | Status: DC | PRN

## 2019-11-22 MED ORDER — ALTEPLASE 2 MG IJ SOLR: 2.0000 mg | Freq: Once | INTRAMUSCULAR | Status: DC | PRN

## 2019-11-22 NOTE — Progress Notes (Signed)
Occupational Therapy Evaluation Patient Details Name: Samuel Burns MRN: [id number] DOB: [birth year] Today's Date: 11/22/2019    History of Present Illness Samuel Burns is a 34 y.o. male with a hx of asthma, HTN now admitted 11/19/19 with thracic back pain for 3 days, Nausea for 2 weeks, vomiting and diarrhea and no BP meds in 3 weeks, BP on arrival 248/137 P 94, R 16 a febrile, Cr 34.21 BUN 257  who is being seen today for the evaluation of atrial flutter at the request of Dr. Ardelia Mems.  HTN crisis and a flutter wtih RVR.    Clinical Impression   PTA, pt independent and worked part time loading/unloading trucks with [employer]. Pt lives with his son's Mom and his 36 yo son. Mobility limited due to elevated BP of 180/125 sitting (nsg notified). Pt currently requires min A for mobility and ADL tasks @ RW level due to generalized weakness and apparent cognitive impairments as noted below. At this time recommend follow up with [facility] services and initial 24/7 S. Pt states that his significant other and family can provide necessary level of assistance. Will follow acutely.     Follow Up Recommendations  Home health OT;Supervision/Assistance - 24 hour (initially)    Equipment Recommendations  Tub/shower bench;Other (comment) (RW)    Recommendations for Other Services       Precautions / Restrictions Precautions Precautions: Fall      Mobility Bed Mobility Overal bed mobility: Modified Independent                Transfers Overall transfer level: Needs assistance Equipment used: Rolling walker (2 wheeled) Transfers: Sit to/from [employer] Transfers   Stand pivot transfers: Min assist       General transfer comment: slow deliberate gait    Balance Overall balance assessment: Needs assistance   Sitting balance-Leahy Scale: Good       Standing balance-Leahy Scale: Poor                             ADL either performed or assessed with clinical judgement    ADL Overall ADL's : Needs assistance/impaired Eating/Feeding: Modified independent   Grooming: Set up;Sitting   Upper Body Bathing: Set up;Sitting   Lower Body Bathing: Minimal assistance;Sit to/from stand   Upper Body Dressing : Set up;Sitting   Lower Body Dressing: Minimal assistance;Sit to/from stand Lower Body Dressing Details (indicate cue type and reason): took 8 min to donn socks Toilet Transfer: Minimal assistance;Ambulation   Toileting- Clothing Manipulation and Hygiene: Min guard       Functional mobility during ADLs: Minimal assistance;Cueing for safety;Cueing for sequencing;Rolling walker General ADL Comments: Fatigues easily     Vision Baseline Vision/History: No visual deficits       Perception     Praxis      Pertinent Vitals/Pain Pain Assessment: No/denies pain     Hand Dominance Right   Extremity/Trunk Assessment Upper Extremity Assessment Upper Extremity Assessment: Generalized weakness   Lower Extremity Assessment Lower Extremity Assessment: Defer to PT evaluation   Cervical / Trunk Assessment Cervical / Trunk Assessment: Normal   Communication Communication Communication: No difficulties   Cognition Arousal/Alertness: Awake/alert Behavior During Therapy: Flat affect Overall Cognitive Status: Impaired/Different from baseline Area of Impairment: Orientation;Attention;Memory;Safety/judgement;Awareness;Problem solving                 Orientation Level: Disoriented to;Time Current Attention Level: Sustained Memory: Decreased short-term memory (3/3 immediate recall; 1/3 delayed  recall)   Safety/Judgement: Decreased awareness of safety Awareness: Emergent Problem Solving: Slow processing;Decreased initiation General Comments: very slow processing; brother states "he seems a little confused"; pt states "I'm usually not so slow";   General Comments  BP 179/119; [id number] after sitting 5 min 180//125    Exercises     Shoulder  Instructions      Home Living Family/patient expects to be discharged to:: Private residence Living Arrangements: Spouse/significant other Available Help at Discharge: Family;Available PRN/intermittently Type of Home: House Home Access: Stairs to enter [employer] of Steps: 10 Entrance Stairs-Rails: Right;Left;Can reach both Home Layout: One level     Bathroom Shower/Tub: Corporate investment banker: Standard Bathroom Accessibility: Yes How Accessible: Accessible via walker Home Equipment: None          Prior Functioning/Environment Level of Independence: Independent        Comments: Goes to dialysis         OT Problem List: Decreased strength;Decreased activity tolerance;Impaired balance (sitting and/or standing);Decreased cognition;Decreased safety awareness;Decreased knowledge of use of DME or AE;Cardiopulmonary status limiting activity      OT Treatment/Interventions: Self-care/ADL training;Therapeutic exercise;Energy conservation;DME and/or AE instruction;Therapeutic activities;Cognitive remediation/compensation;Patient/family education;Balance training    OT Goals(Current goals can be found in the care plan section) Acute Rehab OT Goals Patient Stated Goal: to walk again and be normal OT Goal Formulation: With patient Time For Goal Achievement: 12/06/19 Potential to Achieve Goals: Good  OT Frequency: Min 2X/week   Barriers to D/C:            Co-evaluation              AM-PAC OT "6 Clicks" Daily Activity     Outcome Measure Help from another person eating meals?: None Help from another person taking care of personal grooming?: A Little Help from another person toileting, which includes using toliet, bedpan, or urinal?: A Little Help from another person bathing (including washing, rinsing, drying)?: A Little Help from another person to put on and taking off regular upper body clothing?: A Little Help from another person to  put on and taking off regular lower body clothing?: A Little 6 Click Score: 19   End of Session Equipment Utilized During Treatment: Rolling walker Nurse Communication: Mobility status  Activity Tolerance: Patient tolerated treatment well Patient left: in bed;with call bell/phone within reach;with family/visitor present  OT Visit Diagnosis: Unsteadiness on feet (R26.81);Muscle weakness (generalized) (M62.81);Other symptoms and signs involving cognitive function                Time: 0930-1002 OT Time Calculation (min): 32 min Charges:  OT General Charges $OT Visit: 1 Visit OT Evaluation $OT Eval Moderate Complexity: 1 Mod OT Treatments $Self Care/Home Management : 8-22 mins  Maurie Boettcher, OT/L   Acute OT Clinical Specialist [place] Pager [phone] Office [phone]   [facility] 11/22/2019, 10:13 AM

## 2019-11-22 NOTE — Consult Note (Addendum)
[place]  [phone] [phone]    [place]  Referring MD:  Dr. Chrisandra Netters  Reason for Referral: Anemia and thrombocytopenia  HPI: Mr. Stonehocker is a 34 year old male medical history significant for asthma and hypertension.  He presented to the hospital with nausea and vomiting.  In the emergency room he was noted to be significantly hypertensive with a blood pressure of 248/137.  He had a 2-week history of decreased urine output and on admission his creatinine was significantly elevated at 34.2 and his BUN was 257.  His CBC on admission showed a WBC of 13.7, hemoglobin 7.3, and platelet count of 58,000.  Other labs include a ferritin which was elevated at 1046, iron 40, TIBC 238, percent saturation 17%, reticulocyte count percentage 10%, absolute reticulocyte count to 12.8, immature reticulocyte fraction 28.8%, haptoglobin less than 10, LDH 743, vitamin B12 1290, Coombs test negative, ADAMTS13 59.5%.  The patient has been started on dialysis and receiving his third session today.  The patient has received 1 unit PRBC so far this admission.  The patient's hemoglobin today is 8.2 and platelet count 68,000.  His BUN and creatinine are slowly coming down to 118 and 17.79 respectively today.  The patient was seen during dialysis.  He is somewhat somnolent but does awaken to answer some questions.  He reports that he is tolerating his dialysis well overall.  He denies any headaches or dizziness.  No recent fevers or chills.  He currently denies chest pain and shortness of breath.  He states that his abdominal pain, nausea, vomiting have resolved.  He currently denies any bleeding such as epistaxis, hemoptysis, hematemesis, hematuria, melena, hematochezia.  The patient tells me that he is single and has 1 child.  He denies history of alcohol or tobacco use.  Reports that his father died from end-stage renal disease.  Hematology was asked to  see the patient to make recommendations regarding his anemia and thrombocytopenia.  Past Medical History:  Diagnosis Date  . Asthma   :    Past Surgical History:  Procedure Laterality Date  . IR FLUORO GUIDE CV LINE RIGHT  11/20/2019  . IR US GUIDE VASC ACCESS RIGHT  11/20/2019  :   CURRENT MEDS: Current Facility-Administered Medications  Medication Dose Route Frequency Provider Last Rate Last Admin  . 0.9 %  sodium chloride infusion (Manually program via Guardrails IV Fluids)   Intravenous Once Briant Cedar, MD   Held at 11/20/19 1303  . 0.9 %  sodium chloride infusion  100 mL Intravenous PRN Justin Mend, MD      . 0.9 %  sodium chloride infusion  100 mL Intravenous PRN Justin Mend, MD      . acetaminophen (TYLENOL) tablet 650 mg  650 mg Oral Q6H PRN Zola Button, MD       Or  . acetaminophen (TYLENOL) suppository 650 mg  650 mg Rectal Q6H PRN Zola Button, MD      . alteplase (CATHFLO ACTIVASE) injection 2 mg  2 mg Intracatheter Once PRN Justin Mend, MD      . amLODipine (NORVASC) tablet 10 mg  10 mg Oral Daily Mullis, Kiersten P, DO   10 mg at 11/22/19 0924  . Chlorhexidine Gluconate Cloth 2 % PADS 6 each  6 each Topical Q0600 Justin Mend, MD   6 each at 11/22/19 [phone]  . Darbepoetin Alfa (ARANESP) 60 MCG/0.3ML injection           .  Darbepoetin Alfa (ARANESP) injection 60 mcg  60 mcg Intravenous Q Wed-HD Justin Mend, MD   60 mcg at 11/22/19 1426  . heparin injection 1,000 Units  1,000 Units Dialysis PRN Justin Mend, MD      . heparin sodium (porcine) 1000 UNIT/ML injection           . HYDROmorphone (DILAUDID) injection 1 mg  1 mg Intravenous Q3H PRN Benay Pike, MD      . labetalol (NORMODYNE) injection 10 mg  10 mg Intravenous Q2H PRN Leonie Man, MD   10 mg at 11/22/19 0458  . lidocaine (PF) (XYLOCAINE) 1 % injection 5 mL  5 mL Intradermal PRN Justin Mend, MD      . lidocaine-prilocaine (EMLA) cream 1 application  1  application Topical PRN Justin Mend, MD      . lip balm (CARMEX) ointment   Topical PRN [name], MD      . losartan (COZAAR) tablet 25 mg  25 mg Oral Daily Justin Mend, MD   25 mg at 11/22/19 0924  . melatonin tablet 5 mg  5 mg Oral QHS Briant Cedar, MD   5 mg at 11/21/19 2201  . metoprolol tartrate (LOPRESSOR) tablet 50 mg  50 mg Oral Q8H Mullis, Kiersten P, DO   50 mg at 11/22/19 0639  . pentafluoroprop-tetrafluoroeth (GEBAUERS) aerosol 1 application  1 application Topical PRN Justin Mend, MD      . polyethylene glycol (MIRALAX / GLYCOLAX) packet 17 g  17 g Oral Daily PRN Zola Button, MD      . sevelamer carbonate (RENVELA) tablet 800 mg  800 mg Oral TID WC Justin Mend, MD      . sodium bicarbonate tablet 1,300 mg  1,300 mg Oral TID Justin Mend, MD   1,300 mg at 11/22/19 0924      No Known Allergies:  Family History  Problem Relation Age of Onset  . Hypertension Maternal Grandmother   :  Social History   Socioeconomic History  . Marital status: Married    Spouse name: Not on file  . Number of children: Not on file  . Years of education: Not on file  . Highest education level: Not on file  Occupational History  . Not on file  Tobacco Use  . Smoking status: Never Smoker  . Smokeless tobacco: Never Used  Substance and Sexual Activity  . Alcohol use: No  . Drug use: Yes    Types: Marijuana  . Sexual activity: Not on file  Other Topics Concern  . Not on file  Social History Narrative  . Not on file   Social Determinants of Health   Financial Resource Strain:   . Difficulty of Paying Living Expenses: Not on file  Food Insecurity:   . Worried About Charity fundraiser in the Last Year: Not on file  . Ran Out of Food in the Last Year: Not on file  Transportation Needs:   . Lack of Transportation (Medical): Not on file  . Lack of Transportation (Non-Medical): Not on file  Physical Activity:   . Days of Exercise per  Week: Not on file  . Minutes of Exercise per Session: Not on file  Stress:   . Feeling of Stress : Not on file  Social Connections:   . Frequency of Communication with Friends and Family: Not on file  . Frequency of Social Gatherings with Friends and Family: Not  on file  . Attends Religious Services: Not on file  . Active Member of Clubs or Organizations: Not on file  . Attends Archivist Meetings: Not on file  . Marital Status: Not on file  Intimate Partner Violence:   . Fear of Current or Ex-Partner: Not on file  . Emotionally Abused: Not on file  . Physically Abused: Not on file  . Sexually Abused: Not on file  :  REVIEW OF SYSTEMS:  A comprehensive 14 point review of systems was negative except as noted in the HPI.    Exam: Patient Vitals for the past 24 hrs:  BP Temp Temp src Pulse Resp SpO2 Weight  11/22/19 1355 (!) 154/96 -- -- -- 18 -- --  11/22/19 1325 (!) 144/95 -- -- -- 18 -- --  11/22/19 1300 (!) 157/102 -- -- 80 17 -- --  11/22/19 1245 (!) 148/101 -- -- -- 20 -- --  11/22/19 1244 (!) 166/108 -- Oral 78 17 98 % 79.3 kg  11/22/19 0741 (!) 166/102 98.3 F (36.8 C) -- 79 16 100 % --  11/22/19 0620 -- -- -- -- -- -- 80.7 kg  11/22/19 0447 (!) 187/122 -- -- -- -- -- --  11/22/19 0403 (!) 174/116 97.7 F (36.5 C) Oral 83 18 100 % --  11/22/19 0218 (!) 178/121 -- -- 81 -- -- --  11/21/19 2351 (!) 176/106 97.9 F (36.6 C) Oral 84 18 100 % --  11/21/19 2148 (!) 190/112 98.5 F (36.9 C) -- 89 20 100 % --  11/21/19 2056 (!) 207/124 98.4 F (36.9 C) Oral 85 20 100 % --  11/21/19 1753 (!) 192/116 98.5 F (36.9 C) -- 89 -- 100 % --  11/21/19 1615 (!) 179/105 98 F (36.7 C) Oral 85 18 99 % 80 kg  11/21/19 1610 -- -- -- -- (!) 22 -- --  11/21/19 1540 (!) 170/107 -- -- -- (!) 24 -- --  11/21/19 1510 (!) 151/100 -- -- -- 19 -- --  11/21/19 1440 (!) 153/96 -- -- -- 14 -- --    General: Sleepy but arousable and able to answer questions Eyes:  no scleral icterus.    ENT:  There were no oropharyngeal lesions.     Lymphatics:  Negative cervical, supraclavicular or axillary adenopathy.   Respiratory: lungs were clear bilaterally without wheezing or crackles.   Cardiovascular:  Regular rate and rhythm, S1/S2, without murmur, rub or gallop.  There was no pedal edema.   GI:  abdomen was soft, flat, nontender, nondistended, without organomegaly.   Musculoskeletal: Strength symmetrical in the upper and lower extremities. Skin exam was without ecchymosis, petechiae.   Neuro exam was nonfocal.  Patient was alert and oriented.  Attention was good.   Language was appropriate.  Mood was normal without depression.  Speech was not pressured.  Thought content was not tangential.    LABS:  Lab Results  Component Value Date   WBC 10.1 11/22/2019   HGB 8.2 (L) 11/22/2019   HCT 25.1 (L) 11/22/2019   PLT 68 (L) 11/22/2019   GLUCOSE 89 11/22/2019   CHOL 186 11/20/2019   TRIG 207 (H) 11/20/2019   HDL 30 (L) 11/20/2019   LDLCALC 115 (H) 11/20/2019   ALT 14 11/22/2019   AST 15 11/22/2019   NA 136 11/22/2019   K 3.4 (L) 11/22/2019   CL 97 (L) 11/22/2019   CREATININE 17.79 (H) 11/22/2019   BUN 118 (H) 11/22/2019   CO2 18 (L)  11/22/2019   INR 1.1 11/20/2019   HGBA1C 3.5 (L) 11/20/2019   . CBC    Component Value Date/Time   WBC 8.2 11/23/2019 0325   RBC 2.39 (L) 11/23/2019 0325   HGB 7.3 (L) 11/23/2019 0325   HCT 23.1 (L) 11/23/2019 0325   PLT 63 (L) 11/23/2019 0325   MCV 96.7 11/23/2019 0325   MCH 30.5 11/23/2019 0325   MCHC 31.6 11/23/2019 0325   RDW 19.1 (H) 11/23/2019 0325     CT ABDOMEN PELVIS WO CONTRAST  Result Date: 11/19/2019 CLINICAL DATA:  Right-sided thoracic back pain, nausea, vomiting, diarrhea, aortic dissection suspected EXAM: CT CHEST, ABDOMEN AND PELVIS WITHOUT CONTRAST TECHNIQUE: Multidetector CT imaging of the chest, abdomen and pelvis was performed following the standard protocol without IV contrast. COMPARISON:  None. FINDINGS: CT  CHEST FINDINGS Cardiovascular: No significant vascular findings. Cardiomegaly. No pericardial effusion. Mediastinum/Nodes: No enlarged mediastinal, hilar, or axillary lymph nodes. Thyroid gland, trachea, and esophagus demonstrate no significant findings. Lungs/Pleura: Dependent bibasilar heterogeneous atelectasis or consolidation. No pleural effusion or pneumothorax. Musculoskeletal: No chest wall mass or suspicious bone lesions identified. CT ABDOMEN PELVIS FINDINGS Hepatobiliary: No solid liver abnormality is seen. The gallbladder is distended with wall thickening (series 3, image 77). No radiopaque calculi identified. No biliary ductal dilatation Pancreas: Unremarkable. No pancreatic ductal dilatation or surrounding inflammatory changes. Spleen: Normal in size without significant abnormality. Adrenals/Urinary Tract: Adrenal glands are unremarkable. Kidneys are normal, without renal calculi, solid lesion, or hydronephrosis. Bladder is unremarkable. Stomach/Bowel: Stomach is within normal limits. Appendix appears normal. No evidence of bowel wall thickening, distention, or inflammatory changes. Vascular/Lymphatic: No significant vascular findings are present. No enlarged abdominal or pelvic lymph nodes. Reproductive: No mass or other abnormality. Other: No abdominal wall hernia or abnormality. No abdominopelvic ascites. Musculoskeletal: No acute or significant osseous findings. IMPRESSION: 1. The gallbladder is distended with wall thickening. No radiopaque calculi identified. No biliary ductal dilatation. Findings are concerning for acute cholecystitis. 2. Predominantly dependent bibasilar heterogeneous atelectasis or consolidation. Findings are concerning for infection or aspiration. 3. No obvious evidence of aortic aneurysm or dissection, however please note that noncontrast CT is very limited for the evaluation of aortic dissection and other acute aortic pathology. 4. Cardiomegaly. Electronically Signed   By:  Eddie Candle M.D.   On: 11/19/2019 14:38   DG Chest 2 View  Result Date: 11/19/2019 CLINICAL DATA:  Right back pain EXAM: CHEST - 2 VIEW COMPARISON:  None. FINDINGS: Lungs are clear.  No pleural effusion or pneumothorax. Cardiomegaly. Visualized osseous structures are within normal limits. IMPRESSION: No evidence of acute cardiopulmonary disease. Electronically Signed   By: Julian Hy M.D.   On: 11/19/2019 13:19   CT CHEST WO CONTRAST  Result Date: 11/19/2019 CLINICAL DATA:  Right-sided thoracic back pain, nausea, vomiting, diarrhea, aortic dissection suspected EXAM: CT CHEST, ABDOMEN AND PELVIS WITHOUT CONTRAST TECHNIQUE: Multidetector CT imaging of the chest, abdomen and pelvis was performed following the standard protocol without IV contrast. COMPARISON:  None. FINDINGS: CT CHEST FINDINGS Cardiovascular: No significant vascular findings. Cardiomegaly. No pericardial effusion. Mediastinum/Nodes: No enlarged mediastinal, hilar, or axillary lymph nodes. Thyroid gland, trachea, and esophagus demonstrate no significant findings. Lungs/Pleura: Dependent bibasilar heterogeneous atelectasis or consolidation. No pleural effusion or pneumothorax. Musculoskeletal: No chest wall mass or suspicious bone lesions identified. CT ABDOMEN PELVIS FINDINGS Hepatobiliary: No solid liver abnormality is seen. The gallbladder is distended with wall thickening (series 3, image 77). No radiopaque calculi identified. No biliary ductal dilatation Pancreas: Unremarkable. No pancreatic  ductal dilatation or surrounding inflammatory changes. Spleen: Normal in size without significant abnormality. Adrenals/Urinary Tract: Adrenal glands are unremarkable. Kidneys are normal, without renal calculi, solid lesion, or hydronephrosis. Bladder is unremarkable. Stomach/Bowel: Stomach is within normal limits. Appendix appears normal. No evidence of bowel wall thickening, distention, or inflammatory changes. Vascular/Lymphatic: No  significant vascular findings are present. No enlarged abdominal or pelvic lymph nodes. Reproductive: No mass or other abnormality. Other: No abdominal wall hernia or abnormality. No abdominopelvic ascites. Musculoskeletal: No acute or significant osseous findings. IMPRESSION: 1. The gallbladder is distended with wall thickening. No radiopaque calculi identified. No biliary ductal dilatation. Findings are concerning for acute cholecystitis. 2. Predominantly dependent bibasilar heterogeneous atelectasis or consolidation. Findings are concerning for infection or aspiration. 3. No obvious evidence of aortic aneurysm or dissection, however please note that noncontrast CT is very limited for the evaluation of aortic dissection and other acute aortic pathology. 4. Cardiomegaly. Electronically Signed   By: Eddie Candle M.D.   On: 11/19/2019 14:38   US Aorta  Result Date: 11/19/2019 CLINICAL DATA:  34 year old male with hypertensive emergency and back pain for 3 days. EXAM: ULTRASOUND OF ABDOMINAL AORTA TECHNIQUE: Ultrasound examination of the abdominal aorta and proximal common iliac arteries was performed to evaluate for aneurysm. Additional color and Doppler images of the distal aorta were obtained to document patency. COMPARISON:  None. FINDINGS: Abdominal aortic measurements as follows: Proximal:  2.4 x 2.6 cm (AP by transverse) Mid:         2.0 x 1.9 cm Distal:      1.8 x 2.0 cm Patent: Yes, peak systolic velocity is 244 cm/s Aortoiliac bifurcation and proximal iliac arteries are obscured by overlying bowel gas. IMPRESSION: Negative for abdominal aortic aneurysm. Electronically Signed   By: Genevie Ann M.D.   On: 11/19/2019 13:29   IR Fluoro Guide CV Line Right  Result Date: 11/20/2019 INDICATION: In need of placement of a tunneled hemodialysis catheter for the initiation of dialysis. EXAM: TUNNELED CENTRAL VENOUS HEMODIALYSIS CATHETER PLACEMENT WITH ULTRASOUND AND FLUOROSCOPIC GUIDANCE MEDICATIONS: Zosyn 3.375 g  IV. The antibiotic was given in an appropriate time interval prior to skin puncture. ANESTHESIA/SEDATION: Moderate (conscious) sedation was employed during this procedure. A total of Versed 1 mg and Fentanyl 50 mcg was administered intravenously. Moderate Sedation Time: 15 minutes. The patient's level of consciousness and vital signs were monitored continuously by radiology nursing throughout the procedure under my direct supervision. FLUOROSCOPY TIME:  24 seconds (5 mGy). COMPLICATIONS: None immediate. PROCEDURE: Informed written consent was obtained from the patient after a discussion of the risks, benefits, and alternatives to treatment. Questions regarding the procedure were encouraged and answered. The right neck and chest were prepped with chlorhexidine in a sterile fashion, and a sterile drape was applied covering the operative field. Maximum barrier sterile technique with sterile gowns and gloves were used for the procedure. A timeout was performed prior to the initiation of the procedure. After creating a small venotomy incision, a micropuncture kit was utilized to access the internal jugular vein. Real-time ultrasound guidance was utilized for vascular access including the acquisition of a permanent ultrasound image documenting patency of the accessed vessel. The microwire was utilized to measure appropriate catheter length. A stiff Glidewire was advanced to the level of the IVC and the micropuncture sheath was exchanged for a peel-away sheath. A palindrome tunneled hemodialysis catheter measuring 23 cm from tip to cuff was tunneled in a retrograde fashion from the anterior chest wall to the venotomy incision.  The catheter was then placed through the peel-away sheath with tips ultimately positioned within the superior aspect of the right atrium. Final catheter positioning was confirmed and documented with a spot radiographic image. The catheter aspirates and flushes normally. The catheter was flushed with  appropriate volume heparin dwells. The catheter exit site was secured with a 0-Prolene retention suture. The venotomy incision was closed with an interrupted 4-0 Vicryl, Dermabond and Steri-strips. Dressings were applied. The patient tolerated the procedure well without immediate post procedural complication. IMPRESSION: Successful placement of 23 cm tip to cuff tunneled hemodialysis catheter via the right internal jugular vein with tips terminating within the superior aspect of the right atrium. The catheter is ready for immediate use. Electronically Signed   By: Sandi Mariscal M.D.   On: 11/20/2019 10:53   IR US Guide Vasc Access Right  Result Date: 11/20/2019 INDICATION: In need of placement of a tunneled hemodialysis catheter for the initiation of dialysis. EXAM: TUNNELED CENTRAL VENOUS HEMODIALYSIS CATHETER PLACEMENT WITH ULTRASOUND AND FLUOROSCOPIC GUIDANCE MEDICATIONS: Zosyn 3.375 g IV. The antibiotic was given in an appropriate time interval prior to skin puncture. ANESTHESIA/SEDATION: Moderate (conscious) sedation was employed during this procedure. A total of Versed 1 mg and Fentanyl 50 mcg was administered intravenously. Moderate Sedation Time: 15 minutes. The patient's level of consciousness and vital signs were monitored continuously by radiology nursing throughout the procedure under my direct supervision. FLUOROSCOPY TIME:  24 seconds (5 mGy). COMPLICATIONS: None immediate. PROCEDURE: Informed written consent was obtained from the patient after a discussion of the risks, benefits, and alternatives to treatment. Questions regarding the procedure were encouraged and answered. The right neck and chest were prepped with chlorhexidine in a sterile fashion, and a sterile drape was applied covering the operative field. Maximum barrier sterile technique with sterile gowns and gloves were used for the procedure. A timeout was performed prior to the initiation of the procedure. After creating a small venotomy  incision, a micropuncture kit was utilized to access the internal jugular vein. Real-time ultrasound guidance was utilized for vascular access including the acquisition of a permanent ultrasound image documenting patency of the accessed vessel. The microwire was utilized to measure appropriate catheter length. A stiff Glidewire was advanced to the level of the IVC and the micropuncture sheath was exchanged for a peel-away sheath. A palindrome tunneled hemodialysis catheter measuring 23 cm from tip to cuff was tunneled in a retrograde fashion from the anterior chest wall to the venotomy incision. The catheter was then placed through the peel-away sheath with tips ultimately positioned within the superior aspect of the right atrium. Final catheter positioning was confirmed and documented with a spot radiographic image. The catheter aspirates and flushes normally. The catheter was flushed with appropriate volume heparin dwells. The catheter exit site was secured with a 0-Prolene retention suture. The venotomy incision was closed with an interrupted 4-0 Vicryl, Dermabond and Steri-strips. Dressings were applied. The patient tolerated the procedure well without immediate post procedural complication. IMPRESSION: Successful placement of 23 cm tip to cuff tunneled hemodialysis catheter via the right internal jugular vein with tips terminating within the superior aspect of the right atrium. The catheter is ready for immediate use. Electronically Signed   By: Sandi Mariscal M.D.   On: 11/20/2019 10:53   ECHOCARDIOGRAM COMPLETE  Result Date: 11/21/2019    ECHOCARDIOGRAM REPORT   Patient Name:   ARIE Lipinski Date of Exam: 11/21/2019 Medical Rec #:  [id number]     Height:  67.0 in Accession #:    [id number]    Weight:       178.4 lb Date of Birth:  02/06/1986    BSA:          1.926 m Patient Age:    33 years      BP:           186/106 mmHg Patient Gender: M             HR:           81 bpm. Exam Location:  Inpatient  Procedure: 2D Echo and Intracardiac Opacification Agent Indications:    Dyspnea 786.09 / R06.00  History:        Patient has no prior history of Echocardiogram examinations.                 Acute renal failure, Hypertensive emergency, Elevated troponin,                 Elevated BNP, Normocytic anemia, Thrombocytopenia.  Sonographer:    Darlina Sicilian RDCS Referring Phys: [place]  1. Left ventricular ejection fraction, by estimation, is 50 to 55%. The left ventricle has low normal function. The left ventricle has no regional wall motion abnormalities. There is severe concentric left ventricular hypertrophy. Left ventricular diastolic parameters are consistent with Grade II diastolic dysfunction (pseudonormalization). Elevated left ventricular end-diastolic pressure.  2. Right ventricular systolic function is normal. The right ventricular size is normal. There is normal pulmonary artery systolic pressure.  3. Left atrial size was mildly dilated.  4. Moderate pericardial effusion. The pericardial effusion is circumferential. There is no evidence of cardiac tamponade.  5. The mitral valve is normal in structure. Trivial mitral valve regurgitation. No evidence of mitral stenosis.  6. The aortic valve is tricuspid. Aortic valve regurgitation is not visualized. No aortic stenosis is present.  7. The inferior vena cava is normal in size with greater than 50% respiratory variability, suggesting right atrial pressure of 3 mmHg. FINDINGS  Left Ventricle: Left ventricular ejection fraction, by estimation, is 50 to 55%. The left ventricle has low normal function. The left ventricle has no regional wall motion abnormalities. Definity contrast agent was given IV to delineate the left ventricular endocardial borders. The left ventricular internal cavity size was normal in size. There is severe concentric left ventricular hypertrophy. Left ventricular diastolic parameters are consistent with Grade II  diastolic dysfunction (pseudonormalization). Elevated left ventricular end-diastolic pressure. Right Ventricle: The right ventricular size is normal. No increase in right ventricular wall thickness. Right ventricular systolic function is normal. There is normal pulmonary artery systolic pressure. The tricuspid regurgitant velocity is 2.09 m/s, and  with an assumed right atrial pressure of 3 mmHg, the estimated right ventricular systolic pressure is 28.7 mmHg. Left Atrium: Left atrial size was mildly dilated. Right Atrium: Right atrial size was normal in size. Pericardium: A moderately sized pericardial effusion is present. The pericardial effusion is circumferential. There is no evidence of cardiac tamponade. Mitral Valve: The mitral valve is normal in structure. Trivial mitral valve regurgitation. No evidence of mitral valve stenosis. Tricuspid Valve: The tricuspid valve is normal in structure. Tricuspid valve regurgitation is trivial. No evidence of tricuspid stenosis. Aortic Valve: The aortic valve is tricuspid. Aortic valve regurgitation is not visualized. No aortic stenosis is present. Pulmonic Valve: The pulmonic valve was normal in structure. Pulmonic valve regurgitation is not visualized. No evidence of pulmonic stenosis. Aorta: The aortic root is normal in size and  structure. Venous: The inferior vena cava is normal in size with greater than 50% respiratory variability, suggesting right atrial pressure of 3 mmHg. IAS/Shunts: No atrial level shunt detected by color flow Doppler.  LEFT VENTRICLE PLAX 2D LVIDd:         5.50 cm      Diastology LVIDs:         4.30 cm      LV e' medial:    4.57 cm/s LV PW:         1.90 cm      LV E/e' medial:  28.0 LV IVS:        1.90 cm      LV e' lateral:   3.48 cm/s LVOT diam:     2.10 cm      LV E/e' lateral: 36.8 LV SV:         82 LV SV Index:   43 LVOT Area:     3.46 cm  LV Volumes (MOD) LV vol d, MOD A2C: 273.0 ml LV vol d, MOD A4C: 255.0 ml LV vol s, MOD A2C: 136.0 ml LV  vol s, MOD A4C: 131.0 ml LV SV MOD A2C:     137.0 ml LV SV MOD A4C:     255.0 ml LV SV MOD BP:      129.1 ml RIGHT VENTRICLE RV S prime:     20.20 cm/s TAPSE (M-mode): 2.3 cm LEFT ATRIUM              Index       RIGHT ATRIUM           Index LA diam:        3.60 cm  1.87 cm/m  RA Area:     18.90 cm LA Vol (A2C):   95.3 ml  49.48 ml/m RA Volume:   53.10 ml  27.57 ml/m LA Vol (A4C):   105.0 ml 54.51 ml/m LA Biplane Vol: 104.0 ml 53.99 ml/m  AORTIC VALVE LVOT Vmax:   151.00 cm/s LVOT Vmean:  103.000 cm/s LVOT VTI:    0.237 m  AORTA Ao Root diam: 3.50 cm Ao Asc diam:  3.20 cm MITRAL VALVE                TRICUSPID VALVE MV Area (PHT): 3.07 cm     TR Peak grad:   17.5 mmHg MV Decel Time: 247 msec     TR Vmax:        209.00 cm/s MV E velocity: 128.00 cm/s MV A velocity: 100.00 cm/s  SHUNTS MV E/A ratio:  1.28         Systemic VTI:  0.24 m                             Systemic Diam: 2.10 cm Skeet Latch MD Electronically signed by Skeet Latch MD Signature Date/Time: 11/21/2019/12:09:06 PM    Final    US Abdomen Limited RUQ  Result Date: 11/19/2019 CLINICAL DATA:  Gallstones EXAM: ULTRASOUND ABDOMEN LIMITED RIGHT UPPER QUADRANT COMPARISON:  CT from same day FINDINGS: Gallbladder: There is gallbladder sludge within a distended gallbladder. There is mild gallbladder wall thickening measuring up to 3.5 mm. There is pericholecystic free fluid. No distinct gallstones are identified. The sonographic Percell Miller sign was reported as negative. Common bile duct: Diameter: 3 mm Liver: The liver parenchyma is coarsened and heterogeneous. The portal triads appear to be echogenic. Portal vein is patent on color  Doppler imaging with normal direction of blood flow towards the liver. Other: The right kidney is echogenic. IMPRESSION: 1. Findings are equivocal for acute cholecystitis. While there is gallbladder wall thickening with pericholecystic free fluid in the presence of gallbladder sludge, the sonographic Murphy sign is  negative. Consider further evaluation with HIDA scan as clinically indicated. 2. Coarsened and heterogeneous appearance of the liver with somewhat echogenic portal triads. Findings may indicate underlying hepatocellular disease or acute hepatitis. 3. Echogenic right kidney concerning for medical renal disease. Electronically Signed   By: Constance Holster M.D.   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 -Labs from 11/20/2019-ferritin 1046, iron 40, TIBC 238, percent saturation 17%, reticulocyte count percentage 10.0%, absolute reticulocyte count 212, immature reticulocyte fraction 28.8%, haptoglobin less than 10, LDH 743, vitamin B12 1290, DAT negative, ADAMTS13 59.5%. -Labs not indicative of TTP given relatively high ADASMTS13 level.  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 -Recommend continued management of his hypertension and renal disease. -Continue close monitoring of CBC and transfuse PRBCs for hemoglobin less than 7 or platelets for less than 20,000 or active bleeding. -Darbepoetin per nephrology.  2) AKI versus end-stage renal disease -Nephrology is following the patient closely and he is receiving hemodialysis. -BUN and creatinine are slowly trending downward.  3) hypertensive urgency -Currently receiving amlodipine, losartan, metoprolol, and labetalol as needed -Blood pressure improving with current medications.  Thank you for this referral.  Mikey Bussing, DNP, AGPCNP-BC, AOCNP Mon/Tues/Thurs/Fri 7am-5pm; Off Wednesdays Cell: [phone]   ADDENDUM  .Patient was Personally and independently interviewed, examined and relevant elements of the history of present illness were reviewed in details and an assessment and plan was created. All elements of the  patient's history of present illness , assessment and plan were discussed in details with Mikey Bussing, DNP, AGPCNP-BC, AOCNP. The above documentation reflects our combined findings assessment and plan.  In summary 34 year old with history of asthma and hypertension presenting with hypertensive emergency with blood pressures of 248/137 and concerns for persistent uncontrolled hypertension.  Also noted to have microangiopathic hemolytic anemia and thrombocytopenia with normal fibrinogen levels and coags.  Negative Coombs test.   severe acute kidney injury versus chronic kidney disease.  Possible GI symptomatology couple of weeks prior to admission. Patient had a low plasmic risk score suggesting low risk for TTP.  No fevers altered mental status noted.  Adam TS 13 levels close to normal at 59.5%. 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  Low likelihood for TTP.  Less likely HUS.  No chronic pattern to suggest atypical HUS. Plan -Low likelihood for TTP. 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  Patient's echocardiogram shows severe LVH and grade 2 diastolic dysfunction which would be consistent with this understanding of severe uncontrolled hypertension. -Management of renal issues per nephrology. -No indication for plasma exchange at this time. -Anticipate hemolysis and thrombocytopenia will gradually improve with control of hypertension and optimization of renal replacement therapy.  Could trend bilirubin levels LDH and blood counts to monitor improvement. -do anticipate some persistent anemia from CKD -folic acid and B complex to support accelerated hematopoeisis. -Call if any additional questions from hematology standpoint  Sullivan Lone MD MS

## 2019-11-22 NOTE — Progress Notes (Signed)
Spoke with Dr. Ellyn Hack, cardiology. No need to trend troponin, likely demand ischemia. Pericardial effusion likely from uremia, should improve with dialysis. May need repeat imaging in a few weeks.  Zola Button, MD PGY-1, [place]

## 2019-11-22 NOTE — Progress Notes (Signed)
Pt. Very withdrawn today, not engaging in conversation about discharge planning. When asked if he feels ok pt. States "yes". This nurse ask if pt. Is still feeling sad about his sickness, and pt. Replies by nodding head yes, this nurse then ask if he needs someone to talk to about his emotions regarding his feeling towards his sickness and he replies by nodding head yes. Dr. Johnney Ou made aware and will follow up. Also made primary nurse Darrick Grinder, RN aware. Nurse states she will follow up as well.

## 2019-11-22 NOTE — Progress Notes (Signed)
Renal Navigator met with patient at bedside to discuss outpatient HD referral per request from Dr. Arlyss Gandy. Patient seems to be processing information slowly, but was pleasant, welcoming, and states he understands what we are talking about. Navigator was brief, just gathered pertinent information and stated that once we have a seat secured, Navigator will return to ensure he is understanding of the plan, which will also be in writing for him. He stated appreciation. He states he is doing well. He reports that he does not have his own transportation, but has family/friends who can assist. Navigator asked if he would like to apply for [facility] [place] and he says he would. Navigator has submitted referral to [facility] to request seat at [facility] if they have a seat available at this time, since this is the closest clinic to patient's home.  Navigator will assist with [facility] [place] application. Navigator will follow closely.  Alphonzo Cruise, [place] Renal Navigator [phone]

## 2019-11-22 NOTE — Progress Notes (Signed)
Family Medicine Teaching Service Daily Progress Note Intern Pager: [phone]  Patient name: Keanen Dohse Medical record number: [id number] Date of birth: 1985/09/21 Age: 34 y.o. Gender: male  Primary Care Provider: Patient, No Pcp Per Consultants: Nephrology, IR, cardiology, general surgery, CCM   Code Status: Full Code  Pt Overview and Major Events to Date:  10/3 Admitted 10/4 placement of tunneled HD catheter by IR, oozing at catheter site, 1u pRBC, FFP, HD 10/5 HD 10/6 HD  Assessment and Plan: Pj Zehner is a 34 y.o. male presenting with nausea and vomiting found to have hypertensive crisis with acute renal failure. PMH is significant forHTN.  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 Patient underwent first HD treatment yesterday.  Appreciate nephrology involvement. Plan to place AV access during admission. - plan for HD today per nephrology - sodium bicarb per nephrology  Hypertensive emergency Initially presented with significant hypertension 248/137 on admission. Was initially treated with nicardipine drip, now on oral medications. BP 166/102 this morning. Has required prn labetalol 5 times in past 24 hours. Losartan started yesterday, not given yesterday but received this morning. Metoprolol increased. - amlodipine 10 mg - metoprolol tartrate 50 mg q8h, can increase to 100 mg BID if needed per cardiology - losartan 25 mg daily - labetalol prn  Elevated troponin Initially flat on admission, starting to trend upwards.  Has been without chest pain or shortness of breath.  No ST elevations on EKG.  Most likely demand ischemia. 579 > 591 > 1,250 > 1,368 > 1,453 > 1,715 > 2,078 > 2,024 > 2,330 - EKG if symptomatic or change in clinical status  HFpEF BNP 1,267 on admission. Secondary to HTN. TTE with EF 50-55%, G2DD, severe concentric LVH. - HTN treatment as above  Pericardial effusion Moderate pericardial effusion seen on TTE without signs of  cardiac tamponade. Likely secondary to uremia. Will continue to monitor.  QTc prolongation Noted on EKG. QTc 549. - avoid QT prolonging medications  Back pain 2-day history of severe back pain prior to admission.  No evidence of aortic dissection or aortic aneurysm, though unable to perform contrast studies.    Equivocal findings of cholecystitis on imaging.   General surgery consulted, appreciate involvement. They do not suspect cholecystitis and antibiotics were stopped. 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  TTP and HUS also on the differential but less likely.  Work-up pending for TTP. Schistocytes seen on PBS and low haptoglobin corroborating with hemolytic anemia. DIC unlikely with normal PT/INR and normal fibrinogen. Hgb stable, 8.2 this morning. - f/u ADAMTS13 activity - formally consult heme/onc if needed - transfusion threshold 7  Thrombocytopenia Likely secondary to uremia.  Pending work-up for TTP as above.  DIC unlikely. Had some oozing at catheter site placement after procedure. No active bleeding currently. - consider ddAVP if bleeding again  A Flutter Had one episode following catheter placement which resolved spontaneously.  Had another episode upon transfer to 2W with HR 140s, patient unaware of heart rate. Cardiology consulted, appreciate involvement. RRR this morning. - metoprolol tartrate as above - f/u cardiology recommendations  Covid vaccination Unvaccinated, interested in receiving. - will coordinate vaccination when more stable  FEN/GI: renal diet PPx: SCDs  Disposition: progressive  Subjective:  Patient asleep this morning, brother Jawal (?) at bedside.  Brother states patient is a deep sleeper and typically sleeps late.  Brother also  states he would be willing to undergo kidney transplant if he is a  Orthoptist.  Objective: Temp:  [97.7 F (36.5 C)-98.5 F (36.9 C)] 98.3 F (36.8 C) (10/06 0741) Pulse Rate:  [79-89] 79 (10/06 0741) Resp:  [14-24] 16 (10/06 0741) BP: (151-207)/(96-124) 166/102 (10/06 0741) SpO2:  [98 %-100 %] 100 % (10/06 0741) Weight:  [80 kg-80.7 kg] 80.7 kg (10/06 0620) Physical Exam: General: Asleep, responds when awakened, NAD Cardiovascular: RRR, 3/6 systolic murmur best heard at the LUSB Respiratory: CTAB, no wheezes or rales, no respiratory distress Abdomen: soft, non-tender, +BS Extremities: WWP, trace edema  Laboratory: Recent Labs  Lab 11/21/19 0712 11/21/19 1313 11/22/19 0220  WBC 9.7 11.9* 10.1  HGB 7.0* 8.0* 8.2*  HCT 21.0* 24.0* 25.1*  PLT 70* 71* 68*   Recent Labs  Lab 11/19/19 0906 11/19/19 0906 11/20/19 0428 11/21/19 0018 11/22/19 0220  NA 135   < > 134* 137 136  K 3.7   < > 3.7 3.3* 3.4*  CL 89*   < > 92* 95* 97*  CO2 15*   < > 13* 16* 18*  BUN 257*   < > 266* 182* 118*  CREATININE 34.21*   < > 35.25* 23.62* 17.79*  CALCIUM 9.5   < > 8.7* 8.6* 8.9  PROT 7.9  --   --  6.3* 7.2  BILITOT 2.3*  --   --  2.4* 2.7*  ALKPHOS 232*  --   --  146* 167*  ALT 21  --   --  12 14  AST 13*  --   --  9* 15  GLUCOSE 131*   < > 109* 102* 89   < > = values in this interval not displayed.     Imaging/Diagnostic Tests: ECHOCARDIOGRAM COMPLETE  Result Date: 11/21/2019    ECHOCARDIOGRAM REPORT   Patient Name:   KAHLEEL Wycoff Date of Exam: 11/21/2019 Medical Rec #:  [id number]     Height:       67.0 in Accession #:    [id number]    Weight:       178.4 lb Date of Birth:  07/28/1985    BSA:          1.926 m Patient Age:    33 years      BP:           186/106 mmHg Patient Gender: M             HR:           81 bpm. Exam Location:  Inpatient Procedure: 2D Echo and Intracardiac Opacification Agent Indications:    Dyspnea 786.09 / R06.00  History:        Patient has no prior history of Echocardiogram examinations.                 Acute renal failure,  Hypertensive emergency, Elevated troponin,                 Elevated BNP, Normocytic anemia, Thrombocytopenia.  Sonographer:    Darlina Sicilian RDCS Referring Phys: [place]  1. Left ventricular ejection fraction, by estimation, is 50 to 55%. The left ventricle has low normal function. The left ventricle has no regional wall motion abnormalities. There is severe concentric left ventricular hypertrophy. Left ventricular diastolic parameters are consistent with Grade II diastolic dysfunction (pseudonormalization). Elevated left ventricular end-diastolic pressure.  2. Right ventricular systolic function is normal. The right ventricular size is normal. There is normal pulmonary  artery systolic pressure.  3. Left atrial size was mildly dilated.  4. Moderate pericardial effusion. The pericardial effusion is circumferential. There is no evidence of cardiac tamponade.  5. The mitral valve is normal in structure. Trivial mitral valve regurgitation. No evidence of mitral stenosis.  6. The aortic valve is tricuspid. Aortic valve regurgitation is not visualized. No aortic stenosis is present.  7. The inferior vena cava is normal in size with greater than 50% respiratory variability, suggesting right atrial pressure of 3 mmHg. FINDINGS  Left Ventricle: Left ventricular ejection fraction, by estimation, is 50 to 55%. The left ventricle has low normal function. The left ventricle has no regional wall motion abnormalities. Definity contrast agent was given IV to delineate the left ventricular endocardial borders. The left ventricular internal cavity size was normal in size. There is severe concentric left ventricular hypertrophy. Left ventricular diastolic parameters are consistent with Grade II diastolic dysfunction (pseudonormalization). Elevated left ventricular end-diastolic pressure. Right Ventricle: The right ventricular size is normal. No increase in right ventricular wall thickness. Right ventricular  systolic function is normal. There is normal pulmonary artery systolic pressure. The tricuspid regurgitant velocity is 2.09 m/s, and  with an assumed right atrial pressure of 3 mmHg, the estimated right ventricular systolic pressure is 10.1 mmHg. Left Atrium: Left atrial size was mildly dilated. Right Atrium: Right atrial size was normal in size. Pericardium: A moderately sized pericardial effusion is present. The pericardial effusion is circumferential. There is no evidence of cardiac tamponade. Mitral Valve: The mitral valve is normal in structure. Trivial mitral valve regurgitation. No evidence of mitral valve stenosis. Tricuspid Valve: The tricuspid valve is normal in structure. Tricuspid valve regurgitation is trivial. No evidence of tricuspid stenosis. Aortic Valve: The aortic valve is tricuspid. Aortic valve regurgitation is not visualized. No aortic stenosis is present. Pulmonic Valve: The pulmonic valve was normal in structure. Pulmonic valve regurgitation is not visualized. No evidence of pulmonic stenosis. Aorta: The aortic root is normal in size and structure. Venous: The inferior vena cava is normal in size with greater than 50% respiratory variability, suggesting right atrial pressure of 3 mmHg. IAS/Shunts: No atrial level shunt detected by color flow Doppler.  LEFT VENTRICLE PLAX 2D LVIDd:         5.50 cm      Diastology LVIDs:         4.30 cm      LV e' medial:    4.57 cm/s LV PW:         1.90 cm      LV E/e' medial:  28.0 LV IVS:        1.90 cm      LV e' lateral:   3.48 cm/s LVOT diam:     2.10 cm      LV E/e' lateral: 36.8 LV SV:         82 LV SV Index:   43 LVOT Area:     3.46 cm  LV Volumes (MOD) LV vol d, MOD A2C: 273.0 ml LV vol d, MOD A4C: 255.0 ml LV vol s, MOD A2C: 136.0 ml LV vol s, MOD A4C: 131.0 ml LV SV MOD A2C:     137.0 ml LV SV MOD A4C:     255.0 ml LV SV MOD BP:      129.1 ml RIGHT VENTRICLE RV S prime:     20.20 cm/s TAPSE (M-mode): 2.3 cm LEFT ATRIUM  Index        RIGHT ATRIUM           Index LA diam:        3.60 cm  1.87 cm/m  RA Area:     18.90 cm LA Vol (A2C):   95.3 ml  49.48 ml/m RA Volume:   53.10 ml  27.57 ml/m LA Vol (A4C):   105.0 ml 54.51 ml/m LA Biplane Vol: 104.0 ml 53.99 ml/m  AORTIC VALVE LVOT Vmax:   151.00 cm/s LVOT Vmean:  103.000 cm/s LVOT VTI:    0.237 m  AORTA Ao Root diam: 3.50 cm Ao Asc diam:  3.20 cm MITRAL VALVE                TRICUSPID VALVE MV Area (PHT): 3.07 cm     TR Peak grad:   17.5 mmHg MV Decel Time: 247 msec     TR Vmax:        209.00 cm/s MV E velocity: 128.00 cm/s MV A velocity: 100.00 cm/s  SHUNTS MV E/A ratio:  1.28         Systemic VTI:  0.24 m                             Systemic Diam: 2.10 cm Skeet Latch MD Electronically signed by Skeet Latch MD Signature Date/Time: 11/21/2019/12:09:06 PM    Final     Zola Button, MD 11/22/2019, 9:32 AM PGY-1, [place] Intern pager: [phone], text pages welcome

## 2019-11-22 NOTE — Progress Notes (Addendum)
Progress Note  Patient Name: Samuel Burns Date of Encounter: 11/22/2019  [place] Cardiologist: No primary care provider on file. new  Patient Profile     Samuel Burns is a 34 y.o. male with a hx of asthma, HTN now admitted 11/19/19 with thracic back pain for 3 days, Nausea for 2 weeks, vomiting and diarrhea and no BP meds in 3 weeks, BP on arrival 248/137 P 94, R 16 a febrile, Cr 34.21 BUN 257  who is being seen today for the evaluation of atrial flutter at the request of Dr. Ardelia Mems. -> Noted to be in hypertensive crisis with blood pressures 248/137 mmHg.  Also developed atrial flutter with RVR.  Was profoundly anemic and has received 1 unit PRBC. Hospital course to date: -> Tunneled catheter placed on 10/4 -> finally underwent HD yesterday evening.;  At least 1 unit PRBC administered along with FFP for oozing.  Subjective   Feeling better - still groggy.  Mental status not "clear"  Inpatient Medications    Scheduled Meds: . sodium chloride   Intravenous Once  . amLODipine  10 mg Oral Daily  . Chlorhexidine Gluconate Cloth  6 each Topical Q0600  . darbepoetin (ARANESP) injection - DIALYSIS  60 mcg Intravenous Q Wed-HD  . heparin sodium (porcine)      . losartan  25 mg Oral Daily  . melatonin  5 mg Oral QHS  . metoprolol tartrate  50 mg Oral Q8H  . sevelamer carbonate  800 mg Oral TID WC  . sodium bicarbonate  1,300 mg Oral TID   Continuous Infusions:  PRN Meds: acetaminophen **OR** acetaminophen, labetalol, lip balm, polyethylene glycol   Vital Signs    Vitals:   11/22/19 1540 11/22/19 1550 11/22/19 1712 11/22/19 2001  BP: (!) 139/42 (!) 152/90 136/83 (!) 139/94  Pulse:  84 83 86  Resp:   16 18  Temp:  98.3 F (36.8 C)  99.8 F (37.7 C)  TempSrc:  Oral  Oral  SpO2:  98% 100% 99%  Weight:  78 kg    Height:        Intake/Output Summary (Last 24 hours) at 11/22/2019 2121 Last data filed at 11/22/2019 1550 Gross per 24 hour  Intake --  Output 1000 ml   Net -1000 ml   Last 3 Weights 11/22/2019 11/22/2019 11/22/2019  Weight (lbs) 171 lb 15.3 oz 174 lb 13.2 oz 177 lb 14.6 oz  Weight (kg) 78 kg 79.3 kg 80.7 kg      Telemetry    SR remaining in 70s-80s.- Personally Reviewed  ECG    Not checked- Personally Reviewed  Physical Exam   Physical Exam Vitals and nursing note reviewed.  Constitutional:      General: He is not in acute distress.    Appearance: Normal appearance.     Comments: Still seems groggy  HENT:     Head: Normocephalic and atraumatic.  Cardiovascular:     Rate and Rhythm: Normal rate.     Pulses: Normal pulses.     Heart sounds: Murmur (1/6sem) heard.  No friction rub. Gallop (S4) present.   Pulmonary:     Effort: Pulmonary effort is normal. No respiratory distress.     Breath sounds: Normal breath sounds.  Musculoskeletal:        General: No swelling. Normal range of motion.  Neurological:     General: No focal deficit present.     Mental Status: He is oriented to person, place, and time.  Labs    High Sensitivity Troponin:   Recent Labs  Lab 11/20/19 1951 11/20/19 2110 11/21/19 0447 11/21/19 0712 11/21/19 1313  TROPONINIHS 1,453* 1,715* 2,078* 2,024* 2,330*      Chemistry Recent Labs  Lab 11/19/19 0906 11/19/19 0906 11/20/19 0428 11/21/19 0018 11/22/19 0220  NA 135   < > 134* 137 136  K 3.7   < > 3.7 3.3* 3.4*  CL 89*   < > 92* 95* 97*  CO2 15*   < > 13* 16* 18*  GLUCOSE 131*   < > 109* 102* 89  BUN 257*   < > 266* 182* 118*  CREATININE 34.21*   < > 35.25* 23.62* 17.79*  CALCIUM 9.5   < > 8.7* 8.6* 8.9  PROT 7.9  --   --  6.3* 7.2  ALBUMIN 4.5   < > 3.6 3.4* 3.7  AST 13*  --   --  9* 15  ALT 21  --   --  12 14  ALKPHOS 232*  --   --  146* 167*  BILITOT 2.3*  --   --  2.4* 2.7*  GFRNONAA 1*   < > 1* 2* 3*  GFRAA 2*  --  2* 2*  --   ANIONGAP 31*   < > 29* 26* 21*   < > = values in this interval not displayed.     Hematology Recent Labs  Lab 11/21/19 0712 11/21/19 1313  11/22/19 0220  WBC 9.7 11.9* 10.1  RBC 2.35* 2.63* 2.74*  HGB 7.0* 8.0* 8.2*  HCT 21.0* 24.0* 25.1*  MCV 89.4 91.3 91.6  MCH 29.8 30.4 29.9  MCHC 33.3 33.3 32.7  RDW 18.6* 18.4* 19.5*  PLT 70* 71* 68*    BNP Recent Labs  Lab 11/19/19 1253  BNP 1,267.0*     DDimer  Recent Labs  Lab 11/20/19 1105  DDIMER 4.07*     Radiology    ECHOCARDIOGRAM COMPLETE  Result Date: 11/21/2019    ECHOCARDIOGRAM REPORT   Patient Name:   Samuel Burns Date of Exam: 11/21/2019 Medical Rec #:  [id number]     Height:       67.0 in Accession #:    [id number]    Weight:       178.4 lb Date of Birth:  [birth year]    BSA:          1.926 m Patient Age:    33 years      BP:           186/106 mmHg Patient Gender: M             HR:           81 bpm. Exam Location:  Inpatient Procedure: 2D Echo and Intracardiac Opacification Agent Indications:    Dyspnea 786.09 / R06.00  History:        Patient has no prior history of Echocardiogram examinations.                 Acute renal failure, Hypertensive emergency, Elevated troponin,                 Elevated BNP, Normocytic anemia, Thrombocytopenia.  Sonographer:    Darlina Sicilian RDCS Referring Phys: [place]  1. Left ventricular ejection fraction, by estimation, is 50 to 55%. The left ventricle has low normal function. The left ventricle has no regional wall motion abnormalities. There is severe concentric left ventricular hypertrophy. Left ventricular diastolic parameters are  consistent with Grade II diastolic dysfunction (pseudonormalization). Elevated left ventricular end-diastolic pressure.  2. Right ventricular systolic function is normal. The right ventricular size is normal. There is normal pulmonary artery systolic pressure.  3. Left atrial size was mildly dilated.  4. Moderate pericardial effusion. The pericardial effusion is circumferential. There is no evidence of cardiac tamponade.  5. The mitral valve is normal in structure. Trivial mitral  valve regurgitation. No evidence of mitral stenosis.  6. The aortic valve is tricuspid. Aortic valve regurgitation is not visualized. No aortic stenosis is present.  7. The inferior vena cava is normal in size with greater than 50% respiratory variability, suggesting right atrial pressure of 3 mmHg. FINDINGS  Left Ventricle: Left ventricular ejection fraction, by estimation, is 50 to 55%. The left ventricle has low normal function. The left ventricle has no regional wall motion abnormalities. Definity contrast agent was given IV to delineate the left ventricular endocardial borders. The left ventricular internal cavity size was normal in size. There is severe concentric left ventricular hypertrophy. Left ventricular diastolic parameters are consistent with Grade II diastolic dysfunction (pseudonormalization). Elevated left ventricular end-diastolic pressure. Right Ventricle: The right ventricular size is normal. No increase in right ventricular wall thickness. Right ventricular systolic function is normal. There is normal pulmonary artery systolic pressure. The tricuspid regurgitant velocity is 2.09 m/s, and  with an assumed right atrial pressure of 3 mmHg, the estimated right ventricular systolic pressure is 16.0 mmHg. Left Atrium: Left atrial size was mildly dilated. Right Atrium: Right atrial size was normal in size. Pericardium: A moderately sized pericardial effusion is present. The pericardial effusion is circumferential. There is no evidence of cardiac tamponade. Mitral Valve: The mitral valve is normal in structure. Trivial mitral valve regurgitation. No evidence of mitral valve stenosis. Tricuspid Valve: The tricuspid valve is normal in structure. Tricuspid valve regurgitation is trivial. No evidence of tricuspid stenosis. Aortic Valve: The aortic valve is tricuspid. Aortic valve regurgitation is not visualized. No aortic stenosis is present. Pulmonic Valve: The pulmonic valve was normal in structure.  Pulmonic valve regurgitation is not visualized. No evidence of pulmonic stenosis. Aorta: The aortic root is normal in size and structure. Venous: The inferior vena cava is normal in size with greater than 50% respiratory variability, suggesting right atrial pressure of 3 mmHg. IAS/Shunts: No atrial level shunt detected by color flow Doppler.  LEFT VENTRICLE PLAX 2D LVIDd:         5.50 cm      Diastology LVIDs:         4.30 cm      LV e' medial:    4.57 cm/s LV PW:         1.90 cm      LV E/e' medial:  28.0 LV IVS:        1.90 cm      LV e' lateral:   3.48 cm/s LVOT diam:     2.10 cm      LV E/e' lateral: 36.8 LV SV:         82 LV SV Index:   43 LVOT Area:     3.46 cm  LV Volumes (MOD) LV vol d, MOD A2C: 273.0 ml LV vol d, MOD A4C: 255.0 ml LV vol s, MOD A2C: 136.0 ml LV vol s, MOD A4C: 131.0 ml LV SV MOD A2C:     137.0 ml LV SV MOD A4C:     255.0 ml LV SV MOD BP:  129.1 ml RIGHT VENTRICLE RV S prime:     20.20 cm/s TAPSE (M-mode): 2.3 cm LEFT ATRIUM              Index       RIGHT ATRIUM           Index LA diam:        3.60 cm  1.87 cm/m  RA Area:     18.90 cm LA Vol (A2C):   95.3 ml  49.48 ml/m RA Volume:   53.10 ml  27.57 ml/m LA Vol (A4C):   105.0 ml 54.51 ml/m LA Biplane Vol: 104.0 ml 53.99 ml/m  AORTIC VALVE LVOT Vmax:   151.00 cm/s LVOT Vmean:  103.000 cm/s LVOT VTI:    0.237 m  AORTA Ao Root diam: 3.50 cm Ao Asc diam:  3.20 cm MITRAL VALVE                TRICUSPID VALVE MV Area (PHT): 3.07 cm     TR Peak grad:   17.5 mmHg MV Decel Time: 247 msec     TR Vmax:        209.00 cm/s MV E velocity: 128.00 cm/s MV A velocity: 100.00 cm/s  SHUNTS MV E/A ratio:  1.28         Systemic VTI:  0.24 m                             Systemic Diam: 2.10 cm Skeet Latch MD Electronically signed by Skeet Latch MD Signature Date/Time: 11/21/2019/12:09:06 PM    Final     Cardiac Studies    Echocardiogram 11/21/2019: EF 50 and 55%.  Severe LVH.  GRII DD.  Mild LA dilation.  Moderate (uremic) pericardial  effusion no tamponade.  Mitral and tricuspid valve normal.    Assessment & Plan    Atrial Flutter with RVR: Rate now improved after recurrent flutter on first day of arrival  Beta-blocker being titrated up to 100 mg twice daily.  10 mg of amlodipine, as needed hydralazine.  Hypertensive crisis (emergency)/accelerated hypertension -> pressure still remain elevated.  No longer on IV medicines.  Being managed by primary team and nephrology.  Slowly gaining control.  Tolerating increased dose of pre much all medications.  Elevated troponins/demand ischemia.  The setting of extreme hypertension and SVT, quite likely demand ischemia.  This goes along with elevated BNP.  Not having any heart failure symptoms. ->  Echo did not show regional wall motion normality.  Would not treat with IV Lasix as he is not having chest pain.  Clinically does not sound like ACS.  Can stop trending levels as they are likely circulating from original injury event.   With his anemia, I would be reluctant to do any anticoagulation especially to assure he is not bleeding.  He may eventually require ischemic evaluation once stabilized, but echocardiogram would be very helpful first.     Moderate pericardial effusion: Most likely uremic.  No evidence of tamponade.  This could potentially be the cause for triggering A Flutter.  -- would recommend rechecking limited echo in ~2-3 weeks to ensure that the effusion has not increased.    Will follow from a distance to ensure that he remains in NSR.   For questions or updates, please contact [place] Please consult [email or web address] for contact info under        Signed, Glenetta Hew, MD  11/22/2019, 9:21 PM

## 2019-11-22 NOTE — Progress Notes (Addendum)
[name] Progress Note   Subjective:   S/p HD #2 yesterday - tolerated without issue.  He's sleepy this AM but denies new issues.    Objective Vitals:   11/22/19 0403 11/22/19 0447 11/22/19 0620 11/22/19 0741  BP: (!) 174/116 (!) 187/122  (!) 166/102  Pulse: 83   79  Resp: 18   16  Temp: 97.7 F (36.5 C)   98.3 F (36.8 C)  TempSrc: Oral     SpO2: 100%   100%  Weight:   80.7 kg   Height:       Physical Exam General: sleepy but arousable Heart: RRR, no rub Lungs: clear Abdomen: soft, nontender Extremities: no edema Dialysis Access:  RIJ TDC c/d/i Neuro:  Conversant, nonfocal  Additional Objective Labs: Basic Metabolic Panel: Recent Labs  Lab 11/20/19 0428 11/21/19 0018 11/22/19 0220  NA 134* 137 136  K 3.7 3.3* 3.4*  CL 92* 95* 97*  CO2 13* 16* 18*  GLUCOSE 109* 102* 89  BUN 266* 182* 118*  CREATININE 35.25* 23.62* 17.79*  CALCIUM 8.7* 8.6* 8.9  PHOS >30.0*  --   --    Liver Function Tests: Recent Labs  Lab 11/19/19 0906 11/19/19 0906 11/20/19 0428 11/21/19 0018 11/22/19 0220  AST 13*  --   --  9* 15  ALT 21  --   --  12 14  ALKPHOS 232*  --   --  146* 167*  BILITOT 2.3*  --   --  2.4* 2.7*  PROT 7.9  --   --  6.3* 7.2  ALBUMIN 4.5   < > 3.6 3.4* 3.7   < > = values in this interval not displayed.   Recent Labs  Lab 11/19/19 0906  LIPASE 169*   CBC: Recent Labs  Lab 11/20/19 1951 11/20/19 1951 11/21/19 0018 11/21/19 0018 11/21/19 0712 11/21/19 1313 11/22/19 0220  WBC 11.7*   < > 11.8*   < > 9.7 11.9* 10.1  HGB 7.5*   < > 7.1*   < > 7.0* 8.0* 8.2*  HCT 21.9*   < > 21.2*   < > 21.0* 24.0* 25.1*  MCV 89.4  --  88.7  --  89.4 91.3 91.6  PLT 74*   < > 69*   < > 70* 71* 68*   < > = values in this interval not displayed.   Blood Culture    Component Value Date/Time   SDES BLOOD RIGHT HAND 11/20/2019 0008   SPECREQUEST  11/20/2019 0008    BOTTLES DRAWN AEROBIC AND ANAEROBIC Blood Culture adequate volume   CULT   11/20/2019 0008    NO GROWTH 2 DAYS Performed at [facility], [place] [address] [place], [place] [address]    REPTSTATUS PENDING 11/20/2019 0008    Cardiac Enzymes: No results for input(s): CKTOTAL, CKMB, CKMBINDEX, TROPONINI in the last 168 hours. CBG: No results for input(s): GLUCAP in the last 168 hours. Iron Studies:  Recent Labs    11/20/19 0428  IRON 40*  TIBC 238*  FERRITIN 1,046*   '@lablastinr3' @ Studies/Results: IR Fluoro Guide CV Line Right  Result Date: 11/20/2019 INDICATION: In need of placement of a tunneled hemodialysis catheter for the initiation of dialysis. EXAM: TUNNELED CENTRAL VENOUS HEMODIALYSIS CATHETER PLACEMENT WITH ULTRASOUND AND FLUOROSCOPIC GUIDANCE MEDICATIONS: Zosyn 3.375 g IV. The antibiotic was given in an appropriate time interval prior to skin puncture. ANESTHESIA/SEDATION: Moderate (conscious) sedation was employed during this procedure. A total of Versed 1 mg and  Fentanyl 50 mcg was administered intravenously. Moderate Sedation Time: 15 minutes. The patient's level of consciousness and vital signs were monitored continuously by radiology nursing throughout the procedure under my direct supervision. FLUOROSCOPY TIME:  24 seconds (5 mGy). COMPLICATIONS: None immediate. PROCEDURE: Informed written consent was obtained from the patient after a discussion of the risks, benefits, and alternatives to treatment. Questions regarding the procedure were encouraged and answered. The right neck and chest were prepped with chlorhexidine in a sterile fashion, and a sterile drape was applied covering the operative field. Maximum barrier sterile technique with sterile gowns and gloves were used for the procedure. A timeout was performed prior to the initiation of the procedure. After creating a small venotomy incision, a micropuncture kit was utilized to access the internal jugular vein. Real-time ultrasound guidance was utilized for vascular access including the  acquisition of a permanent ultrasound image documenting patency of the accessed vessel. The microwire was utilized to measure appropriate catheter length. A stiff Glidewire was advanced to the level of the IVC and the micropuncture sheath was exchanged for a peel-away sheath. A palindrome tunneled hemodialysis catheter measuring 23 cm from tip to cuff was tunneled in a retrograde fashion from the anterior chest wall to the venotomy incision. The catheter was then placed through the peel-away sheath with tips ultimately positioned within the superior aspect of the right atrium. Final catheter positioning was confirmed and documented with a spot radiographic image. The catheter aspirates and flushes normally. The catheter was flushed with appropriate volume heparin dwells. The catheter exit site was secured with a 0-Prolene retention suture. The venotomy incision was closed with an interrupted 4-0 Vicryl, Dermabond and Steri-strips. Dressings were applied. The patient tolerated the procedure well without immediate post procedural complication. IMPRESSION: Successful placement of 23 cm tip to cuff tunneled hemodialysis catheter via the right internal jugular vein with tips terminating within the superior aspect of the right atrium. The catheter is ready for immediate use. Electronically Signed   By: Sandi Mariscal M.D.   On: 11/20/2019 10:53   IR US Guide Vasc Access Right  Result Date: 11/20/2019 INDICATION: In need of placement of a tunneled hemodialysis catheter for the initiation of dialysis. EXAM: TUNNELED CENTRAL VENOUS HEMODIALYSIS CATHETER PLACEMENT WITH ULTRASOUND AND FLUOROSCOPIC GUIDANCE MEDICATIONS: Zosyn 3.375 g IV. The antibiotic was given in an appropriate time interval prior to skin puncture. ANESTHESIA/SEDATION: Moderate (conscious) sedation was employed during this procedure. A total of Versed 1 mg and Fentanyl 50 mcg was administered intravenously. Moderate Sedation Time: 15 minutes. The patient's  level of consciousness and vital signs were monitored continuously by radiology nursing throughout the procedure under my direct supervision. FLUOROSCOPY TIME:  24 seconds (5 mGy). COMPLICATIONS: None immediate. PROCEDURE: Informed written consent was obtained from the patient after a discussion of the risks, benefits, and alternatives to treatment. Questions regarding the procedure were encouraged and answered. The right neck and chest were prepped with chlorhexidine in a sterile fashion, and a sterile drape was applied covering the operative field. Maximum barrier sterile technique with sterile gowns and gloves were used for the procedure. A timeout was performed prior to the initiation of the procedure. After creating a small venotomy incision, a micropuncture kit was utilized to access the internal jugular vein. Real-time ultrasound guidance was utilized for vascular access including the acquisition of a permanent ultrasound image documenting patency of the accessed vessel. The microwire was utilized to measure appropriate catheter length. A stiff Glidewire was advanced to the level of  the IVC and the micropuncture sheath was exchanged for a peel-away sheath. A palindrome tunneled hemodialysis catheter measuring 23 cm from tip to cuff was tunneled in a retrograde fashion from the anterior chest wall to the venotomy incision. The catheter was then placed through the peel-away sheath with tips ultimately positioned within the superior aspect of the right atrium. Final catheter positioning was confirmed and documented with a spot radiographic image. The catheter aspirates and flushes normally. The catheter was flushed with appropriate volume heparin dwells. The catheter exit site was secured with a 0-Prolene retention suture. The venotomy incision was closed with an interrupted 4-0 Vicryl, Dermabond and Steri-strips. Dressings were applied. The patient tolerated the procedure well without immediate post procedural  complication. IMPRESSION: Successful placement of 23 cm tip to cuff tunneled hemodialysis catheter via the right internal jugular vein with tips terminating within the superior aspect of the right atrium. The catheter is ready for immediate use. Electronically Signed   By: Sandi Mariscal M.D.   On: 11/20/2019 10:53   ECHOCARDIOGRAM COMPLETE  Result Date: 11/21/2019    ECHOCARDIOGRAM REPORT   Patient Name:   Samuel Burns Date of Exam: 11/21/2019 Medical Rec #:  [id number]     Height:       67.0 in Accession #:    [id number]    Weight:       178.4 lb Date of Birth:  [birth year]    BSA:          1.926 m Patient Age:    33 years      BP:           186/106 mmHg Patient Gender: M             HR:           81 bpm. Exam Location:  Inpatient Procedure: 2D Echo and Intracardiac Opacification Agent Indications:    Dyspnea 786.09 / R06.00  History:        Patient has no prior history of Echocardiogram examinations.                 Acute renal failure, Hypertensive emergency, Elevated troponin,                 Elevated BNP, Normocytic anemia, Thrombocytopenia.  Sonographer:    Darlina Sicilian RDCS Referring Phys: Miner  1. Left ventricular ejection fraction, by estimation, is 50 to 55%. The left ventricle has low normal function. The left ventricle has no regional wall motion abnormalities. There is severe concentric left ventricular hypertrophy. Left ventricular diastolic parameters are consistent with Grade II diastolic dysfunction (pseudonormalization). Elevated left ventricular end-diastolic pressure.  2. Right ventricular systolic function is normal. The right ventricular size is normal. There is normal pulmonary artery systolic pressure.  3. Left atrial size was mildly dilated.  4. Moderate pericardial effusion. The pericardial effusion is circumferential. There is no evidence of cardiac tamponade.  5. The mitral valve is normal in structure. Trivial mitral valve regurgitation. No evidence of  mitral stenosis.  6. The aortic valve is tricuspid. Aortic valve regurgitation is not visualized. No aortic stenosis is present.  7. The inferior vena cava is normal in size with greater than 50% respiratory variability, suggesting right atrial pressure of 3 mmHg. FINDINGS  Left Ventricle: Left ventricular ejection fraction, by estimation, is 50 to 55%. The left ventricle has low normal function. The left ventricle has no regional wall motion abnormalities. Definity contrast agent was given IV to delineate  the left ventricular endocardial borders. The left ventricular internal cavity size was normal in size. There is severe concentric left ventricular hypertrophy. Left ventricular diastolic parameters are consistent with Grade II diastolic dysfunction (pseudonormalization). Elevated left ventricular end-diastolic pressure. Right Ventricle: The right ventricular size is normal. No increase in right ventricular wall thickness. Right ventricular systolic function is normal. There is normal pulmonary artery systolic pressure. The tricuspid regurgitant velocity is 2.09 m/s, and  with an assumed right atrial pressure of 3 mmHg, the estimated right ventricular systolic pressure is 54.5 mmHg. Left Atrium: Left atrial size was mildly dilated. Right Atrium: Right atrial size was normal in size. Pericardium: A moderately sized pericardial effusion is present. The pericardial effusion is circumferential. There is no evidence of cardiac tamponade. Mitral Valve: The mitral valve is normal in structure. Trivial mitral valve regurgitation. No evidence of mitral valve stenosis. Tricuspid Valve: The tricuspid valve is normal in structure. Tricuspid valve regurgitation is trivial. No evidence of tricuspid stenosis. Aortic Valve: The aortic valve is tricuspid. Aortic valve regurgitation is not visualized. No aortic stenosis is present. Pulmonic Valve: The pulmonic valve was normal in structure. Pulmonic valve regurgitation is not  visualized. No evidence of pulmonic stenosis. Aorta: The aortic root is normal in size and structure. Venous: The inferior vena cava is normal in size with greater than 50% respiratory variability, suggesting right atrial pressure of 3 mmHg. IAS/Shunts: No atrial level shunt detected by color flow Doppler.  LEFT VENTRICLE PLAX 2D LVIDd:         5.50 cm      Diastology LVIDs:         4.30 cm      LV e' medial:    4.57 cm/s LV PW:         1.90 cm      LV E/e' medial:  28.0 LV IVS:        1.90 cm      LV e' lateral:   3.48 cm/s LVOT diam:     2.10 cm      LV E/e' lateral: 36.8 LV SV:         82 LV SV Index:   43 LVOT Area:     3.46 cm  LV Volumes (MOD) LV vol d, MOD A2C: 273.0 ml LV vol d, MOD A4C: 255.0 ml LV vol s, MOD A2C: 136.0 ml LV vol s, MOD A4C: 131.0 ml LV SV MOD A2C:     137.0 ml LV SV MOD A4C:     255.0 ml LV SV MOD BP:      129.1 ml RIGHT VENTRICLE RV S prime:     20.20 cm/s TAPSE (M-mode): 2.3 cm LEFT ATRIUM              Index       RIGHT ATRIUM           Index LA diam:        3.60 cm  1.87 cm/m  RA Area:     18.90 cm LA Vol (A2C):   95.3 ml  49.48 ml/m RA Volume:   53.10 ml  27.57 ml/m LA Vol (A4C):   105.0 ml 54.51 ml/m LA Biplane Vol: 104.0 ml 53.99 ml/m  AORTIC VALVE LVOT Vmax:   151.00 cm/s LVOT Vmean:  103.000 cm/s LVOT VTI:    0.237 m  AORTA Ao Root diam: 3.50 cm Ao Asc diam:  3.20 cm MITRAL VALVE  TRICUSPID VALVE MV Area (PHT): 3.07 cm     TR Peak grad:   17.5 mmHg MV Decel Time: 247 msec     TR Vmax:        209.00 cm/s MV E velocity: 128.00 cm/s MV A velocity: 100.00 cm/s  SHUNTS MV E/A ratio:  1.28         Systemic VTI:  0.24 m                             Systemic Diam: 2.10 cm Skeet Latch MD Electronically signed by Skeet Latch MD Signature Date/Time: 11/21/2019/12:09:06 PM    Final    Medications:  . sodium chloride   Intravenous Once  . amLODipine  10 mg Oral Daily  . Chlorhexidine Gluconate Cloth  6 each Topical Q0600  . influenza vac split quadrivalent  PF  0.5 mL Intramuscular Tomorrow-1000  . losartan  25 mg Oral Daily  . melatonin  5 mg Oral QHS  . metoprolol tartrate  50 mg Oral Q8H  . sodium bicarbonate  1,300 mg Oral TID    Assessment/Plan: 1.  Acute kidney injury versus end-stage renal disease: The available data points to end-stage renal disease.  s/p IR TDC today with HD #1 Mon , #2 Tues, #3 Wed.  Will need CLIP. Vein map and place AV access here --> will consult VVS when vein mapping complete.  He has PIVs in both [facility] fossas --> will need save arm precautions when decision made which side to place AV access. 2.  Hypertensive urgency: losartan 25 daily added yesterday and MTP ^d as well; monitor today - I think targeting a BP in the 160 range is reasonable and we can achieve better control over the coming weeks.  UF 1L with HD today as well 3.  Anemia: Likely anemia of chronic kidney disease but pending hemolysis w/u.  Initiate ESA now that BP improved, ferritin > 1000, hold IV iron.  1u pRBC 10/4. 4.  Secondary hyperparathyroidism: Screening for secondary hyperparathyroidism with PTH.  His calcium level is within acceptable range.  Phos measured at > 30, rechecking with this AMs labs. 5.  Elevated troponin levels: Cardiology following, not thought to be ACS. 6.  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  A hematology consult could be considered - discussed with primary team.  7.  Metabolic acidosis, Anion Gap:  Likely secondary to CKD, will improve with HD.  Cont po bicarb for now given low intensity HD to start.  D/c when at full HD.  Jannifer Hick MD 11/22/2019, 8:19 AM  [facility] Pager: [phone]

## 2019-11-22 NOTE — Progress Notes (Signed)
FPTS Interim Progress Note  S: Interviewed patient at bedside.  He reports doing well today. States his dialysis session went well. States his abdominal pain is continuing to improve but is still present. He only asked for more water. Spoke with NT and requested more water for him. He had no other complaints at the moment.  O: BP (!) 139/94 (BP Location: Right Arm)   Pulse 86   Temp 99.8 F (37.7 C) (Oral)   Resp 18   Ht 5\' 7"  (1.702 m)   Wt 78 kg   SpO2 99%   BMI 26.93 kg/m   Physical Exam Vitals and nursing note reviewed.  Constitutional:      General: He is not in acute distress.    Appearance: Normal appearance. He is normal weight. He is not ill-appearing, toxic-appearing or diaphoretic.  HENT:     Head: Normocephalic and atraumatic.  Cardiovascular:     Rate and Rhythm: Normal rate and regular rhythm.     Pulses: Normal pulses.          Radial pulses are 2+ on the right side and 2+ on the left side.       Dorsalis pedis pulses are 2+ on the right side and 2+ on the left side.     Heart sounds: Normal heart sounds, S1 normal and S2 normal. No murmur heard.   Pulmonary:     Effort: Pulmonary effort is normal. No respiratory distress.     Breath sounds: Normal breath sounds. No wheezing.  Abdominal:     General: There is no distension.     Tenderness: There is abdominal tenderness (mild) in the right upper quadrant.  Musculoskeletal:     Right lower leg: 1+ Edema present.     Left lower leg: 1+ Edema present.  Neurological:     Mental Status: He is alert. Mental status is at baseline.      A/P: Currently stable will not make any changes to current plans.  Briant Cedar, MD 11/22/2019, 8:06 PM PGY-1, [facility] pager [phone]

## 2019-11-23 ENCOUNTER — Inpatient Hospital Stay (HOSPITAL_COMMUNITY): Payer: Self-pay

## 2019-11-23 DIAGNOSIS — D638 Anemia in other chronic diseases classified elsewhere: Secondary | ICD-10-CM

## 2019-11-23 DIAGNOSIS — M311 Thrombotic microangiopathy, unspecified: Secondary | ICD-10-CM

## 2019-11-23 DIAGNOSIS — D696 Thrombocytopenia, unspecified: Secondary | ICD-10-CM

## 2019-11-23 DIAGNOSIS — N186 End stage renal disease: Secondary | ICD-10-CM

## 2019-11-23 DIAGNOSIS — N179 Acute kidney failure, unspecified: Secondary | ICD-10-CM

## 2019-11-23 LAB — CBC
HCT: 23.1 % — ABNORMAL LOW (ref 39.0–52.0)
Hemoglobin: 7.3 g/dL — ABNORMAL LOW (ref 13.0–17.0)
MCH: 30.5 pg (ref 26.0–34.0)
MCHC: 31.6 g/dL (ref 30.0–36.0)
MCV: 96.7 fL (ref 80.0–100.0)
Platelets: 63 10*3/uL — ABNORMAL LOW (ref 150–400)
RBC: 2.39 MIL/uL — ABNORMAL LOW (ref 4.22–5.81)
RDW: 19.1 % — ABNORMAL HIGH (ref 11.5–15.5)
WBC: 8.2 10*3/uL (ref 4.0–10.5)
nRBC: 0.5 % — ABNORMAL HIGH (ref 0.0–0.2)

## 2019-11-23 LAB — COMPREHENSIVE METABOLIC PANEL
ALT: 22 U/L (ref 0–44)
AST: 21 U/L (ref 15–41)
Albumin: 3.2 g/dL — ABNORMAL LOW (ref 3.5–5.0)
Alkaline Phosphatase: 129 U/L — ABNORMAL HIGH (ref 38–126)
Anion gap: 16 — ABNORMAL HIGH (ref 5–15)
BUN: 57 mg/dL — ABNORMAL HIGH (ref 6–20)
CO2: 22 mmol/L (ref 22–32)
Calcium: 8.7 mg/dL — ABNORMAL LOW (ref 8.9–10.3)
Chloride: 99 mmol/L (ref 98–111)
Creatinine, Ser: 10.56 mg/dL — ABNORMAL HIGH (ref 0.61–1.24)
GFR calc non Af Amer: 6 mL/min — ABNORMAL LOW (ref >60)
Glucose, Bld: 84 mg/dL (ref 70–99)
Potassium: 4.1 mmol/L (ref 3.5–5.1)
Sodium: 137 mmol/L (ref 135–145)
Total Bilirubin: 1.9 mg/dL — ABNORMAL HIGH (ref 0.3–1.2)
Total Protein: 6.1 g/dL — ABNORMAL LOW (ref 6.5–8.1)

## 2019-11-23 LAB — SURGICAL PCR SCREEN
MRSA, PCR: NEGATIVE
Staphylococcus aureus: NEGATIVE

## 2019-11-23 LAB — PHOSPHORUS: Phosphorus: 5.9 mg/dL — ABNORMAL HIGH (ref 2.5–4.6)

## 2019-11-23 LAB — MAGNESIUM: Magnesium: 2.2 mg/dL (ref 1.7–2.4)

## 2019-11-23 LAB — PTH, INTACT AND CALCIUM
Calcium, Total (PTH): 8.5 mg/dL — ABNORMAL LOW (ref 8.7–10.2)
PTH: 208 pg/mL — ABNORMAL HIGH (ref 15–65)

## 2019-11-23 MED ORDER — CEFAZOLIN SODIUM-DEXTROSE 1-4 GM/50ML-% IV SOLN
1.0000 g | INTRAVENOUS | Status: AC
  Filled 2019-11-23: qty 50

## 2019-11-23 NOTE — Anesthesia Preprocedure Evaluation (Addendum)
Anesthesia Evaluation  Patient identified by MRN, date of birth, ID band Patient awake    Reviewed: Allergy & Precautions, NPO status , Patient's Chart, lab work & pertinent test results  History of Anesthesia Complications Negative for: history of anesthetic complications  Airway Mallampati: II  TM Distance: >3 FB Neck ROM: Full    Dental no notable dental hx. (+) Dental Advisory Given   Pulmonary asthma ,    Pulmonary exam normal        Cardiovascular hypertension, Pt. on medications and Pt. on home beta blockers Normal cardiovascular exam+ dysrhythmias Atrial Fibrillation   IMPRESSIONS    1. Left ventricular ejection fraction, by estimation, is 50 to 55%. The  left ventricle has low normal function. The left ventricle has no regional  wall motion abnormalities. There is severe concentric left ventricular  hypertrophy. Left ventricular  diastolic parameters are consistent with Grade II diastolic dysfunction  (pseudonormalization). Elevated left ventricular end-diastolic pressure.  2. Right ventricular systolic function is normal. The right ventricular  size is normal. There is normal pulmonary artery systolic pressure.  3. Left atrial size was mildly dilated.  4. Moderate pericardial effusion. The pericardial effusion is  circumferential. There is no evidence of cardiac tamponade.  5. The mitral valve is normal in structure. Trivial mitral valve  regurgitation. No evidence of mitral stenosis.  6. The aortic valve is tricuspid. Aortic valve regurgitation is not  visualized. No aortic stenosis is present.  7. The inferior vena cava is normal in size with greater than 50%  respiratory variability, suggesting right atrial pressure of 3 mmHg.    Neuro/Psych negative neurological ROS     GI/Hepatic negative GI ROS, Neg liver ROS,   Endo/Other  negative endocrine ROS  Renal/GU ARFRenal disease      Musculoskeletal negative musculoskeletal ROS (+)   Abdominal   Peds  Hematology negative hematology ROS (+) anemia ,   Anesthesia Other Findings   Reproductive/Obstetrics                            Anesthesia Physical Anesthesia Plan  ASA: III  Anesthesia Plan: MAC   Post-op Pain Management:    Induction:   PONV Risk Score and Plan: 1 and Ondansetron and Propofol infusion  Airway Management Planned: Natural Airway  Additional Equipment:   Intra-op Plan:   Post-operative Plan:   Informed Consent: I have reviewed the patients History and Physical, chart, labs and discussed the procedure including the risks, benefits and alternatives for the proposed anesthesia with the patient or authorized representative who has indicated his/her understanding and acceptance.     Dental advisory given  Plan Discussed with: Anesthesiologist, CRNA and Surgeon  Anesthesia Plan Comments:        Anesthesia Quick Evaluation

## 2019-11-23 NOTE — Progress Notes (Signed)
This RN phoned [facility] per spouse and patient request. Call not answered. VM left with nurse information, patient request and room number. Will cont to monitor.

## 2019-11-23 NOTE — Progress Notes (Signed)
Report was given to Ty, RN on 3E for transfer. Pt still need consent signed pt stated that he needed farther  explanation about the procedure being done on 10/8. Spouse is at bedside giving pt bath and hair cut to improve mood. Pt has been feeling sad and depressed about diagnosis. All belongings has been bagged and transferred with pt and spouse iphone, Games developer, airpods, clothing and shoes.

## 2019-11-23 NOTE — TOC Initial Note (Addendum)
Transition of Care [facility]) - Initial/Assessment Note    Patient Details  Name: Samuel Burns MRN: [id number] Date of Birth: 1985/12/15  Transition of Care [facility]) CM/SW Contact:    Joanne Chars, LCSW Phone Number: 11/23/2019, 8:59 AM  Clinical Narrative:     CSW met with pt to discuss DC plan.  Pt lives in apartment with son's mother [place], states they are not in relationship currently but continue to share the apartment.  35 year old son also there.  Pt uninsured, does not have PCP, states someone did come speak with him about medicaid since he has been in the hospital.  Pt is not vaccinated.  Pt agreeable to [facility], choice document given.  Permission to speak to [place] and her mother, Rodell Perna.   Permission given to speak to [facility] and to set up PCP appt with [facility].  Pt does not have any equipment currently.    1000: CSW spoke to  [place] Bessant/[phone]/Med Assist who will be screening pt today for medicaid.          Expected Discharge Plan: [place] Barriers to Discharge: Waiting for outpatient dialysis, Inadequate or no insurance   Patient Goals and CMS Choice Patient states their goals for this hospitalization and ongoing recovery are:: get back to being independent, get back to work [employer].gov Compare Post Acute Care list provided to:: Patient Choice offered to / list presented to : Patient  Expected Discharge Plan and Services Expected Discharge Plan: [place]   Discharge Planning Services: CM Consult Post Acute Care Choice: [place] arrangements for the past 2 months: Apartment                                      Prior Living Arrangements/Services Living arrangements for the past 2 months: Apartment Lives with:: Other (Comment), Minor Children (son's mother) Patient language and need for interpreter reviewed:: Yes Do you feel safe going back to the place where you live?: Yes       Need for Family Participation in Patient Care: No (Comment) Care giver support system in place?: Yes (comment)   Criminal Activity/Legal Involvement Pertinent to Current Situation/Hospitalization: No - Comment as needed  Activities of Daily Living      Permission Sought/Granted Permission sought to share information with : Facility Sport and exercise psychologist, Family Supports Permission granted to share information with : Yes, Verbal Permission Granted  Share Information with NAME: son's mother [place], son's grandmother Rodell Perna  Permission granted to share info w AGENCY: [facility]        Emotional Assessment Appearance:: Appears stated age Attitude/Demeanor/Rapport: Lethargic Affect (typically observed): Quiet Orientation: : Oriented to Self, Oriented to Place, Oriented to  Time, Oriented to Situation Alcohol / Substance Use: Not Applicable Psych Involvement: No (comment)  Admission diagnosis:  Gallstone [U31.49] Metabolic acidosis [F02.6] Elevated troponin [R77.8] Acute renal failure (ARF) (HCC) [N17.9] ESRD (end stage renal disease) on dialysis (HCC) [N18.6, Z99.2] Hypertensive emergency [I16.1] Acute renal failure, unspecified acute renal failure type ([place]) [N17.9] Acute right-sided thoracic back pain [M54.6] Patient Active Problem List   Diagnosis Date Noted  . Atrial flutter with rapid ventricular response ([place]) 11/21/2019  . ESRD (end stage renal disease) on dialysis ([place])   . Acute renal failure (ARF) ([place]) 11/19/2019  . Demand ischemia ([place])   . Hypertensive emergency   .  Metabolic acidosis    PCP:  Patient, No Pcp Per Pharmacy:   [facility] [place], [address] [place] [address] Phone: [phone] Fax: [phone]     Social Determinants of Health (SDOH) Interventions    Readmission Risk Interventions No flowsheet data found.

## 2019-11-23 NOTE — Progress Notes (Signed)
Upper extremity vein mapping has been completed.   Preliminary results in CV Proc.   Samuel Burns 11/23/2019 9:26 AM

## 2019-11-23 NOTE — TOC Progression Note (Signed)
Transition of Care [facility]) - Progression Note    Patient Details  Name: Samuel Burns MRN: [id number] Date of Birth: Dec 14, 1985  [facility]) CM/SW Contact  Joanne Chars, [place] Phone Number: 11/23/2019, 8:49 AM  Clinical Narrative:   CSW LM with Saprese Jones/Financial counseling to confirm they will meet with pt regarding medicaid.  CSW unable to meet with pt today as he is in dialysis.    Expected Discharge Plan: Home/Self Care Barriers to Discharge: Continued Medical Work up  Expected Discharge Plan and Services Expected Discharge Plan: Home/Self Care   Discharge Planning Services: CM Consult   Living arrangements for the past 2 months: Apartment                                       Social Determinants of Health (SDOH) Interventions    Readmission Risk Interventions No flowsheet data found.

## 2019-11-23 NOTE — Progress Notes (Signed)
[facility] Progress Note   Subjective:   S/p HD #3 yesterday - tolerated without issue.  Feeling ok this AM - no new issues.  Appetite fair.     Objective Vitals:   11/22/19 2001 11/22/19 2333 11/23/19 0343 11/23/19 0712  BP: (!) 139/94 (!) 142/89 134/82 (!) 149/101  Pulse: 86 82 80 85  Resp: 18 18 18 16   Temp: 99.8 F (37.7 C) 98.9 F (37.2 C) 98.8 F (37.1 C)   TempSrc: Oral Oral Oral   SpO2: 99% 99% 100% 100%  Weight:   79.8 kg   Height:       Physical Exam General: eating at edge of bed Heart: RRR, no rub Lungs: clear Abdomen: soft, nontender Extremities: no edema Dialysis Access:  [facility] c/d/i Neuro:  Conversant, nonfocal  Additional Objective Labs: Basic Metabolic Panel: Recent Labs  Lab 11/20/19 0428 11/20/19 0428 11/21/19 0018 11/22/19 0220 11/23/19 0325  NA 134*   < > 137 136 137  K 3.7   < > 3.3* 3.4* 4.1  CL 92*   < > 95* 97* 99  CO2 13*   < > 16* 18* 22  GLUCOSE 109*   < > 102* 89 84  BUN 266*   < > 182* 118* 57*  CREATININE 35.25*   < > 23.62* 17.79* 10.56*  CALCIUM 8.7*   < > 8.6* 8.9  8.5* 8.7*  PHOS >30.0*  --   --  8.5* 5.9*   < > = values in this interval not displayed.   Liver Function Tests: Recent Labs  Lab 11/21/19 0018 11/22/19 0220 11/23/19 0325  AST 9* 15 21  ALT 12 14 22   ALKPHOS 146* 167* 129*  BILITOT 2.4* 2.7* 1.9*  PROT 6.3* 7.2 6.1*  ALBUMIN 3.4* 3.7 3.2*   Recent Labs  Lab 11/19/19 0906  LIPASE 169*   CBC: Recent Labs  Lab 11/21/19 0018 11/21/19 0018 11/21/19 0712 11/21/19 0712 11/21/19 1313 11/22/19 0220 11/23/19 0325  WBC 11.8*   < > 9.7   < > 11.9* 10.1 8.2  HGB 7.1*   < > 7.0*   < > 8.0* 8.2* 7.3*  HCT 21.2*   < > 21.0*   < > 24.0* 25.1* 23.1*  MCV 88.7  --  89.4  --  91.3 91.6 96.7  PLT 69*   < > 70*   < > 71* 68* 63*   < > = values in this interval not displayed.   Blood Culture    Component Value Date/Time   SDES BLOOD RIGHT HAND 11/20/2019 0008   SPECREQUEST  11/20/2019  0008    BOTTLES DRAWN AEROBIC AND ANAEROBIC Blood Culture adequate volume   CULT  11/20/2019 0008    NO GROWTH 2 DAYS Performed at [facility], [place] [address] [place], [place] [address]    REPTSTATUS PENDING 11/20/2019 0008    Cardiac Enzymes: No results for input(s): CKTOTAL, CKMB, CKMBINDEX, TROPONINI in the last 168 hours. CBG: No results for input(s): GLUCAP in the last 168 hours. Iron Studies:  No results for input(s): IRON, TIBC, TRANSFERRIN, FERRITIN in the last 72 hours. @lablastinr3 @ Studies/Results: VAS [place] UPPER EXT VEIN MAPPING (PRE-OP AVF)  Result Date: 11/23/2019 UPPER EXTREMITY VEIN MAPPING  Indications: Pre-access. Performing Technologist: Abram Sander RVS  Examination Guidelines: A complete evaluation includes B-mode imaging, spectral Doppler, color Doppler, and power Doppler as needed of all accessible portions of each vessel. Bilateral testing is considered an integral part of a  complete examination. Limited examinations for reoccurring indications may be performed as noted. +-----------------+-------------+----------+---------+ Right Cephalic   Diameter (cm)Depth (cm)Findings  +-----------------+-------------+----------+---------+ Shoulder             0.27        0.51             +-----------------+-------------+----------+---------+ Prox upper arm       0.23        0.41             +-----------------+-------------+----------+---------+ Mid upper arm        0.26        0.36             +-----------------+-------------+----------+---------+ Dist upper arm       0.29        0.42             +-----------------+-------------+----------+---------+ Antecubital fossa    0.35        0.52             +-----------------+-------------+----------+---------+ Prox forearm         0.20        0.47             +-----------------+-------------+----------+---------+ Mid forearm          0.21        0.49   branching  +-----------------+-------------+----------+---------+ Dist forearm         0.21        0.43             +-----------------+-------------+----------+---------+ Wrist                0.19        0.41             +-----------------+-------------+----------+---------+ +-----------------+-------------+----------+--------+ Right Basilic    Diameter (cm)Depth (cm)Findings +-----------------+-------------+----------+--------+ Prox upper arm       0.28        1.22            +-----------------+-------------+----------+--------+ Mid upper arm        0.28        0.83            +-----------------+-------------+----------+--------+ Dist upper arm       0.20        0.68            +-----------------+-------------+----------+--------+ Antecubital fossa    0.17        0.42            +-----------------+-------------+----------+--------+ Prox forearm         0.21        0.40            +-----------------+-------------+----------+--------+ Mid forearm          0.16        0.31            +-----------------+-------------+----------+--------+ Distal forearm       0.16        0.34            +-----------------+-------------+----------+--------+ +-----------------+-------------+----------+---------+ Left Cephalic    Diameter (cm)Depth (cm)Findings  +-----------------+-------------+----------+---------+ Shoulder             0.16        0.52             +-----------------+-------------+----------+---------+ Prox upper arm       0.16        0.47             +-----------------+-------------+----------+---------+  Mid upper arm        0.19        0.47             +-----------------+-------------+----------+---------+ Dist upper arm       0.19        0.58             +-----------------+-------------+----------+---------+ Antecubital fossa    0.39        1.03             +-----------------+-------------+----------+---------+ Prox forearm         0.23        0.45    branching +-----------------+-------------+----------+---------+ Mid forearm          0.17        0.44             +-----------------+-------------+----------+---------+ Dist forearm         0.18        0.43             +-----------------+-------------+----------+---------+ Wrist                0.17        0.42             +-----------------+-------------+----------+---------+ +-----------------+-------------+----------+--------------+ Left Basilic     Diameter (cm)Depth (cm)   Findings    +-----------------+-------------+----------+--------------+ Prox upper arm       0.23        1.13                  +-----------------+-------------+----------+--------------+ Mid upper arm        0.24        1.25                  +-----------------+-------------+----------+--------------+ Dist upper arm       0.26        0.87     branching    +-----------------+-------------+----------+--------------+ Antecubital fossa    0.31        0.64     branching    +-----------------+-------------+----------+--------------+ Prox forearm                            not visualized +-----------------+-------------+----------+--------------+ Mid forearm                             not visualized +-----------------+-------------+----------+--------------+ Distal forearm                          not visualized +-----------------+-------------+----------+--------------+ Elbow                                   not visualized +-----------------+-------------+----------+--------------+ Wrist                                   not visualized +-----------------+-------------+----------+--------------+ *See table(s) above for measurements and observations.  Diagnosing physician:    Preliminary    Medications:  . sodium chloride   Intravenous Once  . amLODipine  10 mg Oral Daily  . Chlorhexidine Gluconate Cloth  6 each Topical Q0600  . darbepoetin (ARANESP) injection - DIALYSIS  60  mcg Intravenous Q Wed-HD  . losartan  25 mg Oral Daily  . melatonin  5 mg  Oral QHS  . metoprolol tartrate  50 mg Oral Q8H  . sevelamer carbonate  800 mg Oral TID WC  . sodium bicarbonate  1,300 mg Oral TID    Assessment/Plan: 1.  Acute kidney injury versus end-stage renal disease: The available data points to end-stage renal disease.  s/p IR TDC today with HD #1 Mon , #2 Tues, #3 Wed. CLIP underway. Vein map and place AV access here --> will consult VVS this AM as vein mapping now completed.  He has PIVs in both [facility] fossas --> will need save arm precautions when decision made which side to place AV access. 2.  Hypertensive urgency: resovled - now normotensive on po meds.  3.  Anemia: Likely anemia of chronic kidney disease perhaps with some hemolysis with HTN emergency.    1u pRBC 10/4 .Initiated ESA now that BP improved, ferritin > 1000, hold IV iron. 4.  Secondary hyperparathyroidism: PTH 208.  His calcium level is within acceptable range.  Phos 5.9 - renal diet and sevelamer. 5.  Elevated troponin levels: Cardiology following, not thought to be ACS. 6.  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  ADAMTS13 low but not consistent with TTS.  7.  Metabolic acidosis, Anion Gap:  Likely secondary to CKD, will improve with HD.  Cont po bicarb for now given low intensity HD to start.  D/c now that on HD. 8.  A flutter:  BB per cardiology.  TTE with mod pericardial effusion without tamponade thought to be uremic in etiology --> plan to recheck in 2-3 weeks.  Holding [facility] for now with anemia, they will follow.  Jannifer Hick MD 11/23/2019, 11:15 AM  [facility] Pager: [phone]

## 2019-11-23 NOTE — Progress Notes (Signed)
Nutrition Education Note  RD consulted for Renal Education. Provided Renal Food Pyramid in AVS. Attempted to contact patient x2 with no answer. Will attempt to contact at later date if possible.   Encourage pt to discuss specific diet questions/concerns with RD at HD outpatient facility.   Labs and medications reviewed. If additional nutrition issues arise, please re-consult RD.  Mariana Single RD, LDN Clinical Nutrition Pager listed in [place]

## 2019-11-23 NOTE — Progress Notes (Signed)
Rounded on patient today in correlation to transition to HD. Ordered consult to dietician and Kidney Failure Book. Patient educated at the bedside regarding care of tunneled dialysis catheter, AV fistula vs graft, and proper medication administration.  Patient became tearful at the prospect of this being his new normal. Educated patient that he can still maintain a sense of normalcy along with importance of adhering to his dialysis regimen. Patient capable of verbalizing via teach back method. Educated patient on services will be available to him through the interdisciplinary team in the clinic setting. Patient with no further questions at this time. Handouts and contact information provided to patient for any further assistance. Will follow as appropriate.   Dorthey Sawyer, RN  Dialysis Nurse Coordinator Phone: [phone]

## 2019-11-23 NOTE — Progress Notes (Signed)
PT Cancellation Note  Patient Details Name: Samuel Burns MRN: [id number] DOB: 05/04/1985   Cancelled Treatment:    Reason Eval/Treat Not Completed: Fatigue/lethargy limiting ability to participate patient sound asleep, attempted multimodal techniques to wake but did not wake enough to participate in therapy. Will continue to follow acutely.    Windell Norfolk, DPT, PN1   Supplemental Physical Therapist [facility]    Pager [phone] Acute Rehab Office [phone]

## 2019-11-23 NOTE — Progress Notes (Addendum)
FMTS Attending Daily Note: Samuel Singh, MD  Team Pager [phone] Pager [phone]  I have seen and examined this patient, reviewed their chart. I have discussed this patient with the resident. I agree with the resident's findings, assessment and care plan.  Spoke with patient and his partner ([place]), who was on phone.   My edits are within note.   Status is: Inpatient  Remains inpatient appropriate because:Inpatient level of care appropriate due to severity of illness   Dispo:  Patient From: Home  Planned Disposition: Home  Expected discharge date: 11/27/19  Medically stable for discharge: No    Family Medicine Teaching Service Daily Progress Note Intern Pager: [phone]  Patient name: Samuel Burns Medical record number: [id number] Date of birth: Feb 13, 1986 Age: 34 y.o. Gender: male  Primary Care Provider: Patient, No Pcp Per Consultants: Nephrology, IR, cardiology, general surgery, CCM, heme/onc   Code Status: Full Code  Pt Overview and Major Events to Date:  10/3 Admitted 10/4 placement of tunneled HD catheter by IR, oozing at catheter site, 1u pRBC, FFP, HD 10/5 HD 10/6 HD 10/7 Upper extremity vein mapping  Assessment and Plan: Samuel Burns is a 34 y.o. male presenting with nausea and vomiting found to have hypertensive crisis with acute renal failure. PMH is significant forHTN.  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 Appreciate nephrology involvement. Plan to place AV access during admission. - vein mapping today, appreciate vascular surgery consultation and care, NPO for AV graft in RUE 10/8 - sodium bicarb per nephrology - renal diet - sevelamer 800 mg TID with meals   Hypertension, improving with HD.  Initially presented with significant hypertension 248/137 on admission. Was initially treated with nicardipine drip, now on oral medications. BP 149/101 this morning. Gradual improvement with oral  medications. - amlodipine 10 mg - metoprolol tartrate 50 mg q8h, can increase to 100 mg BID if needed - losartan 25 mg daily  Type 2 NSTEMI, resolved.  Initially flat on admission, had started to trend upwards with highest 2,330.  Has been without chest pain or shortness of breath.  No ST elevations on EKG.  Most likely demand ischemia. No further workup indicated at this time per cardiology. - EKG if symptomatic or change in clinical status  HFpEF Newly diagnosed, likely chronic. Not in exacerbation. BNP 1,267 on admission. Secondary to HTN. TTE with EF 50-55%, G2DD, severe concentric LVH. - HTN treatment as above  Pericardial effusion Moderate pericardial effusion seen on TTE without signs of cardiac tamponade. Likely secondary to uremia. Will continue to monitor. - repeat TTE in 2-3 weeks  QTc prolongation Noted on EKG. QTc 549. - will repeat EKG today - avoid QT prolonging medications  Back pain 2-day history of severe back pain prior to admission.  No evidence of aortic dissection or aortic aneurysm, though unable to perform contrast studies.    Equivocal findings of cholecystitis on imaging.   General surgery consulted, appreciate involvement. They do not suspect cholecystitis and antibiotics were stopped. 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  ADAMSTS was just below normal, DIC unlikely given labs.  Hgb slight drop to 7.3. - transfusion threshold 7  Thrombocytopenia Likely secondary to uremia and MAHA. Had some oozing at catheter site placement after procedure. No active bleeding currently. - consider ddAVP if bleeding again - transfusion for platelets <20k or active bleeding  A Flutter Has had  two episodes. Potentially triggered by pericardial effusion. Cardiology consulted, appreciate involvement. Has remained in NSR. CHADS2VASC is 1. Given  thrombocytopenia, no anticoagulation.  - metoprolol tartrate as above - f/u cardiology recommendations  Covid vaccination Unvaccinated, interested in receiving. - will coordinate vaccination when more stable  Elevated total bilirubin, likely due to hemolysis monitor, sent full hepatic panel.  AGMA, per Nephrology, improving with HD and bicarbonate.   FEN/GI: renal diet PPx: SCDs given his thrombocytopenia   Disposition: med-tele   Subjective:  Previously seen this morning while being transported to vascular imaging.  Reported feeling okay.  Seen again this morning after returning to the floor.  Reports feeling better overall, still trying to process everything.  Reports back pain is resolved.  Still interested in the Covid vaccine.  Objective: Temp:  [98.3 F (36.8 C)-99.8 F (37.7 C)] 98.8 F (37.1 C) (10/07 0343) Pulse Rate:  [78-86] 85 (10/07 0712) Resp:  [14-20] 16 (10/07 0712) BP: (134-166)/(42-108) 149/101 (10/07 0712) SpO2:  [98 %-100 %] 100 % (10/07 0712) Weight:  [78 kg-79.8 kg] 79.8 kg (10/07 0343) Physical Exam: General: Alert, sitting up and eating breakfast, NAD Cardiovascular: RRR, S4 present, no murmur Respiratory: CTAB Abd: soft, non-tender Extremities: [facility]  Laboratory: Recent Labs  Lab 11/21/19 1313 11/22/19 0220 11/23/19 0325  WBC 11.9* 10.1 8.2  HGB 8.0* 8.2* 7.3*  HCT 24.0* 25.1* 23.1*  PLT 71* 68* 63*   Recent Labs  Lab 11/21/19 0018 11/22/19 0220 11/23/19 0325  NA 137 136 137  K 3.3* 3.4* 4.1  CL 95* 97* 99  CO2 16* 18* 22  BUN 182* 118* 57*  CREATININE 23.62* 17.79* 10.56*  CALCIUM 8.6* 8.9 8.7*  PROT 6.3* 7.2 6.1*  BILITOT 2.4* 2.7* 1.9*  ALKPHOS 146* 167* 129*  ALT 12 14 22   AST 9* 15 21  GLUCOSE 102* 89 84     Imaging/Diagnostic Tests: VAS [place] UPPER EXT VEIN MAPPING (PRE-OP AVF)  Result Date: 11/23/2019 UPPER EXTREMITY VEIN MAPPING  Indications: Pre-access. Performing Technologist: Abram Sander RVS  Examination  Guidelines: A complete evaluation includes B-mode imaging, spectral Doppler, color Doppler, and power Doppler as needed of all accessible portions of each vessel. Bilateral testing is considered an integral part of a complete examination. Limited examinations for reoccurring indications may be performed as noted. +-----------------+-------------+----------+---------+ Right Cephalic   Diameter (cm)Depth (cm)Findings  +-----------------+-------------+----------+---------+ Shoulder             0.27        0.51             +-----------------+-------------+----------+---------+ Prox upper arm       0.23        0.41             +-----------------+-------------+----------+---------+ Mid upper arm        0.26        0.36             +-----------------+-------------+----------+---------+ Dist upper arm       0.29        0.42             +-----------------+-------------+----------+---------+ Antecubital fossa    0.35        0.52             +-----------------+-------------+----------+---------+ Prox forearm         0.20        0.47             +-----------------+-------------+----------+---------+ Mid forearm  0.21        0.49   branching +-----------------+-------------+----------+---------+ Dist forearm         0.21        0.43             +-----------------+-------------+----------+---------+ Wrist                0.19        0.41             +-----------------+-------------+----------+---------+ +-----------------+-------------+----------+--------+ Right Basilic    Diameter (cm)Depth (cm)Findings +-----------------+-------------+----------+--------+ Prox upper arm       0.28        1.22            +-----------------+-------------+----------+--------+ Mid upper arm        0.28        0.83            +-----------------+-------------+----------+--------+ Dist upper arm       0.20        0.68             +-----------------+-------------+----------+--------+ Antecubital fossa    0.17        0.42            +-----------------+-------------+----------+--------+ Prox forearm         0.21        0.40            +-----------------+-------------+----------+--------+ Mid forearm          0.16        0.31            +-----------------+-------------+----------+--------+ Distal forearm       0.16        0.34            +-----------------+-------------+----------+--------+ +-----------------+-------------+----------+---------+ Left Cephalic    Diameter (cm)Depth (cm)Findings  +-----------------+-------------+----------+---------+ Shoulder             0.16        0.52             +-----------------+-------------+----------+---------+ Prox upper arm       0.16        0.47             +-----------------+-------------+----------+---------+ Mid upper arm        0.19        0.47             +-----------------+-------------+----------+---------+ Dist upper arm       0.19        0.58             +-----------------+-------------+----------+---------+ Antecubital fossa    0.39        1.03             +-----------------+-------------+----------+---------+ Prox forearm         0.23        0.45   branching +-----------------+-------------+----------+---------+ Mid forearm          0.17        0.44             +-----------------+-------------+----------+---------+ Dist forearm         0.18        0.43             +-----------------+-------------+----------+---------+ Wrist                0.17        0.42             +-----------------+-------------+----------+---------+ +-----------------+-------------+----------+--------------+ Left Basilic  Diameter (cm)Depth (cm)   Findings    +-----------------+-------------+----------+--------------+ Prox upper arm       0.23        1.13                  +-----------------+-------------+----------+--------------+ Mid  upper arm        0.24        1.25                  +-----------------+-------------+----------+--------------+ Dist upper arm       0.26        0.87     branching    +-----------------+-------------+----------+--------------+ Antecubital fossa    0.31        0.64     branching    +-----------------+-------------+----------+--------------+ Prox forearm                            not visualized +-----------------+-------------+----------+--------------+ Mid forearm                             not visualized +-----------------+-------------+----------+--------------+ Distal forearm                          not visualized +-----------------+-------------+----------+--------------+ Elbow                                   not visualized +-----------------+-------------+----------+--------------+ Wrist                                   not visualized +-----------------+-------------+----------+--------------+ *See table(s) above for measurements and observations.  Diagnosing physician:    Preliminary     Zola Button, MD 11/23/2019, 9:07 AM PGY-1, [place] Intern pager: [phone], text pages welcome

## 2019-11-23 NOTE — Consult Note (Addendum)
Hospital Consult    Reason for Consult:  Permanent dialysis access Requesting Physician:  Johnney Ou MRN #:  [id number]  History of Present Illness: This is a 34 y.o. male who was admitted earlier this week with c/o decreased appetite, energy levels, weakness and taste changes.  He was in hypertensive emergency.  In the ER, he was noted to have a creatinine of 34, BUM 266 as well as increased troponins and hgb of 7.3 and thrombocytopenia with platelet count of 58k.  Cardiology was also consulted for atrial flutter.    He had a tunneled dialysis catheter placed by IR and [facility] is asked to consult for permanent dialysis access.    Pt has family hx with his father having ESRD on HD.   The pt is not on a statin for cholesterol management.  The pt is not on a daily aspirin.   Other AC:  none The pt is on CCB, ARB, BB for hypertension.   The pt is not diabetic.   Tobacco hx:  never  Past Medical History:  Diagnosis Date  . Asthma     Past Surgical History:  Procedure Laterality Date  . IR FLUORO GUIDE CV LINE RIGHT  11/20/2019  . IR US GUIDE VASC ACCESS RIGHT  11/20/2019    No Known Allergies  Prior to Admission medications   Medication Sig Start Date End Date Taking? Authorizing Provider  acetaminophen (TYLENOL) 500 MG tablet Take 500-1,000 mg by mouth every 6 (six) hours as needed (for pain).   Yes [provider]  amLODipine (NORVASC) 10 MG tablet Take 1 tablet (10 mg total) by mouth daily. Patient not taking: Reported on 11/19/2019 04/08/17   Jaynee Eagles, PA-C  atorvastatin (LIPITOR) 10 MG tablet Take 1 tablet (10 mg total) by mouth daily. Patient not taking: Reported on 11/19/2019 04/15/17   Jaynee Eagles, PA-C  cetirizine-pseudoephedrine (ZYRTEC-D) 5-120 MG tablet Take 1 tablet by mouth daily. Patient not taking: Reported on 11/19/2019 03/23/17   Frederica Kuster, PA-C  losartan (COZAAR) 100 MG tablet Take 1 tablet (100 mg total) by mouth daily. Patient not taking: Reported on  11/19/2019 04/08/17   Jaynee Eagles, PA-C    Social History   Socioeconomic History  . Marital status: Married    Spouse name: Not on file  . Number of children: Not on file  . Years of education: Not on file  . Highest education level: Not on file  Occupational History  . Not on file  Tobacco Use  . Smoking status: Never Smoker  . Smokeless tobacco: Never Used  Substance and Sexual Activity  . Alcohol use: No  . Drug use: Yes    Types: Marijuana  . Sexual activity: Not on file  Other Topics Concern  . Not on file  Social History Narrative  . Not on file   Social Determinants of Health   Financial Resource Strain:   . Difficulty of Paying Living Expenses: Not on file  Food Insecurity:   . Worried About Charity fundraiser in the Last Year: Not on file  . Ran Out of Food in the Last Year: Not on file  Transportation Needs:   . Lack of Transportation (Medical): Not on file  . Lack of Transportation (Non-Medical): Not on file  Physical Activity:   . Days of Exercise per Week: Not on file  . Minutes of Exercise per Session: Not on file  Stress:   . Feeling of Stress : Not on file  Social  Connections:   . Frequency of Communication with Friends and Family: Not on file  . Frequency of Social Gatherings with Friends and Family: Not on file  . Attends Religious Services: Not on file  . Active Member of Clubs or Organizations: Not on file  . Attends Archivist Meetings: Not on file  . Marital Status: Not on file  Intimate Partner Violence:   . Fear of Current or Ex-Partner: Not on file  . Emotionally Abused: Not on file  . Physically Abused: Not on file  . Sexually Abused: Not on file     Family History  Problem Relation Age of Onset  . Hypertension Maternal Grandmother     ROS: [x]  Positive   [ ]  Negative   [ ]  All sytems reviewed and are negative  Cardiac: []  chest pain/pressure [x]  atrial flutter  Vascular: []  non-healing ulcers []  hx of  DVT  Pulmonary: [x]  asthma/wheezing  Neurologic: []  hx of CVA []  mini stroke  Hematologic: [x anemia [x]  thrombocytopenia    Endocrine:   []  diabetes []  thyroid disease  GI []  GERD  GU: [x]  CKD/renal failure [x]  HD--[]  M/W/F or []  T/T/S  Psychiatric: []  anxiety []  depression  Musculoskeletal: []  arthritis []  joint pain  Integumentary: []  rashes []  ulcers  Constitutional: []  fever  []  chills  Physical Examination  Vitals:   11/23/19 0712 11/23/19 1204  BP: (!) 149/101 (!) 149/97  Pulse: 85 83  Resp: 16 16  Temp:  98.9 F (37.2 C)  SpO2: 100% 99%   Body mass index is 27.55 kg/m.  General:  WDWN in NAD Gait: Not observed HENT: WNL, normocephalic Pulmonary: normal non-labored breathing Cardiac: regular Skin: without rashes Vascular Exam/Pulses:  Right Left  Radial 2+ (normal) 2+ (normal)  Brachial 2+ (normal) 2+ (normal)   Extremities: without ischemic changes, without Gangrene , without cellulitis; without open wounds;  Musculoskeletal: no muscle wasting or atrophy  Neurologic: awake and alert with flat affect;  No focal weakness or paresthesias are detected; speech is fluent/normal Psychiatric:  The pt has flat affect.  CBC    Component Value Date/Time   WBC 8.2 11/23/2019 0325   RBC 2.39 (L) 11/23/2019 0325   HGB 7.3 (L) 11/23/2019 0325   HCT 23.1 (L) 11/23/2019 0325   PLT 63 (L) 11/23/2019 0325   MCV 96.7 11/23/2019 0325   MCH 30.5 11/23/2019 0325   MCHC 31.6 11/23/2019 0325   RDW 19.1 (H) 11/23/2019 0325    BMET    Component Value Date/Time   NA 137 11/23/2019 0325   NA 142 04/08/2017 1520   K 4.1 11/23/2019 0325   CL 99 11/23/2019 0325   CO2 22 11/23/2019 0325   GLUCOSE 84 11/23/2019 0325   BUN 57 (H) 11/23/2019 0325   BUN 17 04/08/2017 1520   CREATININE 10.56 (H) 11/23/2019 0325   CALCIUM 8.7 (L) 11/23/2019 0325   CALCIUM 8.5 (L) 11/22/2019 0220   GFRNONAA 6 (L) 11/23/2019 0325   GFRAA 2 (L) 11/21/2019 0018     COAGS: Lab Results  Component Value Date   INR 1.1 11/20/2019     Non-Invasive Vascular Imaging:   BUE vein mapping 11/23/2019: +-----------------+-------------+----------+---------+  Right Cephalic  Diameter (cm)Depth (cm)Findings   +-----------------+-------------+----------+---------+  Shoulder       0.27     0.51         +-----------------+-------------+----------+---------+  Prox upper arm    0.23     0.41         +-----------------+-------------+----------+---------+  Mid upper arm    0.26     0.36         +-----------------+-------------+----------+---------+  Dist upper arm    0.29     0.42         +-----------------+-------------+----------+---------+  Antecubital fossa  0.35     0.52         +-----------------+-------------+----------+---------+  Prox forearm     0.20     0.47         +-----------------+-------------+----------+---------+  Mid forearm     0.21     0.49  branching  +-----------------+-------------+----------+---------+  Dist forearm     0.21     0.43         +-----------------+-------------+----------+---------+  Wrist        0.19     0.41         +-----------------+-------------+----------+---------+   +-----------------+-------------+----------+--------+  Right Basilic  Diameter (cm)Depth (cm)Findings  +-----------------+-------------+----------+--------+  Prox upper arm    0.28     1.22        +-----------------+-------------+----------+--------+  Mid upper arm    0.28     0.83        +-----------------+-------------+----------+--------+  Dist upper arm    0.20     0.68        +-----------------+-------------+----------+--------+  Antecubital fossa  0.17     0.42         +-----------------+-------------+----------+--------+  Prox forearm     0.21     0.40        +-----------------+-------------+----------+--------+  Mid forearm     0.16     0.31        +-----------------+-------------+----------+--------+  Distal forearm    0.16     0.34        +-----------------+-------------+----------+--------+   +-----------------+-------------+----------+---------+  Left Cephalic  Diameter (cm)Depth (cm)Findings   +-----------------+-------------+----------+---------+  Shoulder       0.16     0.52         +-----------------+-------------+----------+---------+  Prox upper arm    0.16     0.47         +-----------------+-------------+----------+---------+  Mid upper arm    0.19     0.47         +-----------------+-------------+----------+---------+  Dist upper arm    0.19     0.58         +-----------------+-------------+----------+---------+  Antecubital fossa  0.39     1.03         +-----------------+-------------+----------+---------+  Prox forearm     0.23     0.45  branching  +-----------------+-------------+----------+---------+  Mid forearm     0.17     0.44         +-----------------+-------------+----------+---------+  Dist forearm     0.18     0.43         +-----------------+-------------+----------+---------+  Wrist        0.17     0.42         +-----------------+-------------+----------+---------+   +-----------------+-------------+----------+--------------+  Left Basilic   Diameter (cm)Depth (cm)  Findings    +-----------------+-------------+----------+--------------+  Prox upper arm    0.23     1.13           +-----------------+-------------+----------+--------------+  Mid upper arm    0.24     1.25            +-----------------+-------------+----------+--------------+  Dist upper arm    0.26     0.87   branching    +-----------------+-------------+----------+--------------+  Antecubital fossa  0.31  0.64   branching    +-----------------+-------------+----------+--------------+  Prox forearm               not visualized  +-----------------+-------------+----------+--------------+  Mid forearm               not visualized  +-----------------+-------------+----------+--------------+  Distal forearm              not visualized  +-----------------+-------------+----------+--------------+  Elbow                  not visualized  +-----------------+-------------+----------+--------------+  Wrist                  not visualized  +-----------------+-------------+----------+--------------+   AAA u;s 11/19/2019: Negative for AAA  ASSESSMENT/PLAN: This is a 34 y.o. male with AKI and most likely ESRD in need of permanent dialysis access.  Pt had TDC placed today by IR.  -pt is right hand dominant.  Vein mapping reveals that his best vein is the right cephalic.  Dr. Carlis Abbott spoke with pt about using his right arm and pt is agreeable to proceed.  He discussed steal sx with him as well.  Pt willing to proceed.  Will schedule for tomorrow for right arm fistula.  Consent and labs ordered -restrict right arm and move IV out of right arm (order placed) -thrombocytopenia/anemia-pt's platelet count today is 63k and hematology is following and feel this is related to his malignant HTN and recommend continued management of his blood pressure and renal disease.   -pt did get TDC this am with conscious sedation.  Dr. Carlis Abbott will speak with pt again in the morning before proceeding to the OR.    Leontine Locket, PA-C [facility] [phone]  I have seen and  evaluated the patient. I agree with the PA note as documented above.  34 year old male with AKI most likely ESRD that vascular surgery has been consulted for permanent dialysis access.  Notes indicate likely hypertensive etiology.  Patient states he is right-handed.  He works at [employer].  No previous access in the past.  He did get a tunneled right IJ catheter in IR today.  Discussed typical plans for placement in the nondominant arm which would be his left arm but vein mapping shows a better vein in the right arm.  He is amendable to proceed with right arm AV fistula placement tomorrow given better vein.  Please keep n.p.o. after midnight.  Risk and benefits discussed in detail including risk of bleeding, infection, steal, failure to mature.  Please restrict his right arm.  N.p.o. after midnight.  [name], MD [facility] [place] Office: [phone]

## 2019-11-24 ENCOUNTER — Inpatient Hospital Stay (HOSPITAL_COMMUNITY): Payer: Self-pay | Admitting: Anesthesiology

## 2019-11-24 ENCOUNTER — Encounter (HOSPITAL_COMMUNITY)
Admission: EM | Disposition: A | Discharge status: Home / Self Care | Payer: Self-pay | Source: Home / Self Care | Attending: Family Medicine

## 2019-11-24 DIAGNOSIS — N185 Chronic kidney disease, stage 5: Secondary | ICD-10-CM

## 2019-11-24 HISTORY — PX: AV FISTULA PLACEMENT: SHX1204

## 2019-11-24 LAB — BASIC METABOLIC PANEL
Anion gap: 17 — ABNORMAL HIGH (ref 5–15)
BUN: 75 mg/dL — ABNORMAL HIGH (ref 6–20)
CO2: 23 mmol/L (ref 22–32)
Calcium: 9.1 mg/dL (ref 8.9–10.3)
Chloride: 97 mmol/L — ABNORMAL LOW (ref 98–111)
Creatinine, Ser: 12.67 mg/dL — ABNORMAL HIGH (ref 0.61–1.24)
GFR calc non Af Amer: 5 mL/min — ABNORMAL LOW (ref >60)
Glucose, Bld: 92 mg/dL (ref 70–99)
Potassium: 3.7 mmol/L (ref 3.5–5.1)
Sodium: 137 mmol/L (ref 135–145)

## 2019-11-24 LAB — BPAM FFP
Blood Product Expiration Date: 202110092359
Blood Product Expiration Date: 202110122359
ISSUE DATE / TIME: 202110071704
Unit Type and Rh: 6200
Unit Type and Rh: 6200

## 2019-11-24 LAB — HEPATITIS B SURFACE ANTIGEN: Hepatitis B Surface Ag: NONREACTIVE

## 2019-11-24 LAB — CBC
HCT: 26.2 % — ABNORMAL LOW (ref 39.0–52.0)
Hemoglobin: 8.2 g/dL — ABNORMAL LOW (ref 13.0–17.0)
MCH: 30.5 pg (ref 26.0–34.0)
MCHC: 31.3 g/dL (ref 30.0–36.0)
MCV: 97.4 fL (ref 80.0–100.0)
Platelets: 82 10*3/uL — ABNORMAL LOW (ref 150–400)
RBC: 2.69 MIL/uL — ABNORMAL LOW (ref 4.22–5.81)
RDW: 17.4 % — ABNORMAL HIGH (ref 11.5–15.5)
WBC: 8.5 10*3/uL (ref 4.0–10.5)
nRBC: 0.2 % (ref 0.0–0.2)

## 2019-11-24 LAB — HEPATITIS B CORE ANTIBODY, TOTAL: Hep B Core Total Ab: NONREACTIVE

## 2019-11-24 LAB — PREPARE FRESH FROZEN PLASMA
Unit division: 0
Unit division: 0

## 2019-11-24 LAB — HEPATITIS B SURFACE ANTIBODY,QUALITATIVE: Hep B S Ab: REACTIVE — AB

## 2019-11-24 SURGERY — ARTERIOVENOUS (AV) FISTULA CREATION
Anesthesia: Monitor Anesthesia Care | Site: Arm Upper | Laterality: Right

## 2019-11-24 MED ORDER — 0.9 % SODIUM CHLORIDE (POUR BTL) OPTIME
TOPICAL | Status: DC | PRN
  Administered 2019-11-24: 1000 mL

## 2019-11-24 MED ORDER — PENTAFLUOROPROP-TETRAFLUOROETH EX AERO: 1.0000 "application " | INHALATION_SPRAY | CUTANEOUS | Status: DC | PRN

## 2019-11-24 MED ORDER — PHENYLEPHRINE HCL (PRESSORS) 10 MG/ML IV SOLN
INTRAVENOUS | Status: AC
  Filled 2019-11-24: qty 1

## 2019-11-24 MED ORDER — MIDAZOLAM HCL 5 MG/5ML IJ SOLN
INTRAMUSCULAR | Status: DC | PRN
  Administered 2019-11-24: 2 mg via INTRAVENOUS

## 2019-11-24 MED ORDER — PROMETHAZINE HCL 25 MG/ML IJ SOLN: 6.2500 mg | INTRAMUSCULAR | Status: DC | PRN

## 2019-11-24 MED ORDER — PROPOFOL 500 MG/50ML IV EMUL
INTRAVENOUS | Status: DC | PRN
  Administered 2019-11-24: 100 ug/kg/min via INTRAVENOUS

## 2019-11-24 MED ORDER — SODIUM CHLORIDE 0.9 % IV SOLN: 100.0000 mL | INTRAVENOUS | Status: DC | PRN

## 2019-11-24 MED ORDER — PROPOFOL 10 MG/ML IV BOLUS
INTRAVENOUS | Status: DC | PRN
  Administered 2019-11-24: 20 mg via INTRAVENOUS

## 2019-11-24 MED ORDER — CEFAZOLIN SODIUM-DEXTROSE 1-4 GM/50ML-% IV SOLN
INTRAVENOUS | Status: DC | PRN
  Administered 2019-11-24: 1 g via INTRAVENOUS

## 2019-11-24 MED ORDER — PROPOFOL 1000 MG/100ML IV EMUL
INTRAVENOUS | Status: AC
  Filled 2019-11-24: qty 100

## 2019-11-24 MED ORDER — HYDROMORPHONE HCL 1 MG/ML IJ SOLN: 0.2500 mg | INTRAMUSCULAR | Status: DC | PRN

## 2019-11-24 MED ORDER — HEPARIN SODIUM (PORCINE) 1000 UNIT/ML DIALYSIS
1000.0000 [IU] | INTRAMUSCULAR | Status: DC | PRN
  Filled 2019-11-24: qty 1

## 2019-11-24 MED ORDER — SODIUM CHLORIDE 0.9 % IV SOLN: INTRAVENOUS | Status: DC | PRN

## 2019-11-24 MED ORDER — LIDOCAINE-PRILOCAINE 2.5-2.5 % EX CREA: 1.0000 "application " | TOPICAL_CREAM | CUTANEOUS | Status: DC | PRN

## 2019-11-24 MED ORDER — MIDAZOLAM HCL 2 MG/2ML IJ SOLN
INTRAMUSCULAR | Status: AC
  Filled 2019-11-24: qty 2

## 2019-11-24 MED ORDER — FENTANYL CITRATE (PF) 250 MCG/5ML IJ SOLN
INTRAMUSCULAR | Status: AC
  Filled 2019-11-24: qty 5

## 2019-11-24 MED ORDER — CEFAZOLIN SODIUM 1 G IJ SOLR
INTRAMUSCULAR | Status: AC
  Filled 2019-11-24: qty 10

## 2019-11-24 MED ORDER — HEPARIN SODIUM (PORCINE) 1000 UNIT/ML IJ SOLN
INTRAMUSCULAR | Status: DC | PRN
  Administered 2019-11-24: 3000 [IU] via INTRAVENOUS

## 2019-11-24 MED ORDER — OXYCODONE-ACETAMINOPHEN 5-325 MG PO TABS: 1.0000 | ORAL_TABLET | Freq: Four times a day (QID) | ORAL | Status: DC | PRN

## 2019-11-24 MED ORDER — ONDANSETRON HCL 4 MG/2ML IJ SOLN
INTRAMUSCULAR | Status: AC
  Filled 2019-11-24: qty 2

## 2019-11-24 MED ORDER — LIDOCAINE HCL (PF) 1 % IJ SOLN
INTRAMUSCULAR | Status: AC
  Filled 2019-11-24: qty 30

## 2019-11-24 MED ORDER — FENTANYL CITRATE (PF) 100 MCG/2ML IJ SOLN
INTRAMUSCULAR | Status: DC | PRN
  Administered 2019-11-24 (×2): 25 ug via INTRAVENOUS
  Administered 2019-11-24: 50 ug via INTRAVENOUS
  Administered 2019-11-24 (×2): 25 ug via INTRAVENOUS

## 2019-11-24 MED ORDER — PROPOFOL 10 MG/ML IV BOLUS
INTRAVENOUS | Status: AC
  Filled 2019-11-24: qty 20

## 2019-11-24 MED ORDER — SODIUM CHLORIDE 0.9 % IV SOLN
INTRAVENOUS | Status: AC
  Filled 2019-11-24: qty 1.2

## 2019-11-24 MED ORDER — LIDOCAINE HCL 1 % IJ SOLN
INTRAMUSCULAR | Status: DC | PRN
  Administered 2019-11-24: 10 mL

## 2019-11-24 MED ORDER — ACETAMINOPHEN 500 MG PO TABS
1000.0000 mg | ORAL_TABLET | Freq: Once | ORAL | Status: AC
  Administered 2019-11-24: 1000 mg via ORAL
  Filled 2019-11-24: qty 2

## 2019-11-24 MED ORDER — DEXAMETHASONE SODIUM PHOSPHATE 10 MG/ML IJ SOLN
INTRAMUSCULAR | Status: AC
  Filled 2019-11-24: qty 1

## 2019-11-24 MED ORDER — LIDOCAINE-EPINEPHRINE (PF) 1 %-1:200000 IJ SOLN
INTRAMUSCULAR | Status: AC
  Filled 2019-11-24: qty 30

## 2019-11-24 MED ORDER — ALTEPLASE 2 MG IJ SOLR: 2.0000 mg | Freq: Once | INTRAMUSCULAR | Status: DC | PRN

## 2019-11-24 MED ORDER — LIDOCAINE HCL (PF) 1 % IJ SOLN: 5.0000 mL | INTRAMUSCULAR | Status: DC | PRN

## 2019-11-24 MED ORDER — LIDOCAINE 2% (20 MG/ML) 5 ML SYRINGE
INTRAMUSCULAR | Status: DC | PRN
  Administered 2019-11-24: 40 mg via INTRAVENOUS

## 2019-11-24 SURGICAL SUPPLY — 44 items
ARMBAND PINK RESTRICT EXTREMIT (MISCELLANEOUS) ×4 IMPLANT
BAG DECANTER FOR FLEXI CONT (MISCELLANEOUS) ×2 IMPLANT
BLADE CLIPPER SURG (BLADE) ×2 IMPLANT
CANISTER SUCT 3000ML PPV (MISCELLANEOUS) ×2 IMPLANT
CATH EMB 2FR 60CM (CATHETERS) ×2 IMPLANT
CLIP VESOCCLUDE MED 6/CT (CLIP) ×2 IMPLANT
CLIP VESOCCLUDE SM WIDE 6/CT (CLIP) ×2 IMPLANT
COVER PROBE W GEL 5X96 (DRAPES) ×2 IMPLANT
COVER WAND RF STERILE (DRAPES) ×2 IMPLANT
DECANTER SPIKE VIAL GLASS SM (MISCELLANEOUS) ×4 IMPLANT
DERMABOND ADVANCED (GAUZE/BANDAGES/DRESSINGS) ×1
DERMABOND ADVANCED .7 DNX12 (GAUZE/BANDAGES/DRESSINGS) ×1 IMPLANT
DRAPE HALF SHEET 40X57 (DRAPES) ×2 IMPLANT
ELECT REM PT RETURN 9FT ADLT (ELECTROSURGICAL) ×2
ELECTRODE REM PT RTRN 9FT ADLT (ELECTROSURGICAL) ×1 IMPLANT
GLOVE BIO SURGEON STRL SZ 6.5 (GLOVE) ×4 IMPLANT
GLOVE BIO SURGEON STRL SZ7.5 (GLOVE) ×2 IMPLANT
GLOVE BIO SURGEON STRL SZ8 (GLOVE) ×2 IMPLANT
GLOVE BIOGEL PI IND STRL 6 (GLOVE) ×1 IMPLANT
GLOVE BIOGEL PI IND STRL 7.0 (GLOVE) ×1 IMPLANT
GLOVE BIOGEL PI IND STRL 8 (GLOVE) ×1 IMPLANT
GLOVE BIOGEL PI INDICATOR 6 (GLOVE) ×1
GLOVE BIOGEL PI INDICATOR 7.0 (GLOVE) ×1
GLOVE BIOGEL PI INDICATOR 8 (GLOVE) ×1
GOWN STRL REUS W/ TWL LRG LVL3 (GOWN DISPOSABLE) ×2 IMPLANT
GOWN STRL REUS W/ TWL XL LVL3 (GOWN DISPOSABLE) ×2 IMPLANT
GOWN STRL REUS W/TWL LRG LVL3 (GOWN DISPOSABLE) ×2
GOWN STRL REUS W/TWL XL LVL3 (GOWN DISPOSABLE) ×2
HEMOSTAT SPONGE AVITENE ULTRA (HEMOSTASIS) IMPLANT
KIT BASIN OR (CUSTOM PROCEDURE TRAY) ×2 IMPLANT
KIT TURNOVER KIT B (KITS) ×2 IMPLANT
NS IRRIG 1000ML POUR BTL (IV SOLUTION) ×2 IMPLANT
PACK CV ACCESS (CUSTOM PROCEDURE TRAY) ×2 IMPLANT
PAD ARMBOARD 7.5X6 YLW CONV (MISCELLANEOUS) ×4 IMPLANT
SUT MNCRL AB 4-0 PS2 18 (SUTURE) ×2 IMPLANT
SUT PROLENE 6 0 BV (SUTURE) ×2 IMPLANT
SUT PROLENE 7 0 BV 1 (SUTURE) IMPLANT
SUT VIC AB 3-0 SH 27 (SUTURE) ×1
SUT VIC AB 3-0 SH 27X BRD (SUTURE) ×1 IMPLANT
SYR BULB IRRIG 60ML STRL (SYRINGE) ×2 IMPLANT
SYR TB 1ML LUER SLIP (SYRINGE) ×2 IMPLANT
TOWEL GREEN STERILE (TOWEL DISPOSABLE) ×2 IMPLANT
UNDERPAD 30X36 HEAVY ABSORB (UNDERPADS AND DIAPERS) ×2 IMPLANT
WATER STERILE IRR 1000ML POUR (IV SOLUTION) ×2 IMPLANT

## 2019-11-24 NOTE — Progress Notes (Signed)
PT Cancellation Note  Patient Details Name: Samuel Burns MRN: [id number] DOB: [birth year]   Cancelled Treatment:    Reason Eval/Treat Not Completed: Patient at procedure or test/unavailable   Samuel Burns B Samuel Burns 11/24/2019, 7:27 AM  Samuel Burns, PT Acute Rehabilitation Services Pager: [phone] Office: [phone]

## 2019-11-24 NOTE — Progress Notes (Signed)
Family Medicine Teaching Service Daily Progress Note Intern Pager: [phone]  Patient name: Samuel Burns Medical record number: [id number] Date of birth: [birth year] Age: 34 y.o. Gender: male  Primary Care Provider: Patient, No Pcp Per Consultants: Nephrology, IR, cardiology, general surgery, CCM, heme/onc   Code Status: Full Code  Pt Overview and Major Events to Date:  10/3 Admitted 10/4 placement of tunneled HD catheter by IR, oozing at catheter site, 1u pRBC, FFP, HD 10/5 HD 10/6 HD 10/7 Upper extremity vein mapping 10/8 Right brachiocephalic AVF placement, right cephalic vein thrombectomy, HD  Assessment and Plan: Samuel Burns is a 34 y.o. male presenting with nausea and vomiting found to have hypertensive crisis with acute renal failure. PMH is significant forHTN.  Acute renal failure Most likely etiology malignant hypertension.Appreciate nephrology involvement. AVF access placed today without complication. CLIP in process. - plan for HD today per nephrology - sodium bicarb discontinued per nephrology  Hypertensive emergency Initially presented with significant hypertension 248/137 on admission, now resolved and adequately controlled on oral medications. - amlodipine 10 mg - metoprolol tartrate 50 mg q8h, can increase to 100 mg BID if needed per cardiology - losartan 25 mg daily  Type 2 NSTEMI Resolved. Has been without chest pain or SOB, no ST elevations on EKG. No further workup per cardiology. - EKG if symptomatic or change in clinical status  HFpEF Newly diagnosed, likely chronic. Not in exacerbation. BNP 1,267 on admission.Secondary to HTN. TTE with EF 50-55%, G2DD, severe concentric LVH. - HTN treatment as above  Pericardial effusion Moderate pericardial effusion seen on TTE without signs of cardiac tamponade. Likely secondary to uremia. Will continue to monitor. - will need repeat TTE in 2-3 weeks  QTc prolongation Noted on EKG, now resolved. QTc  463.  Back pain Resolved.  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 TTP and DIC ruled out. Heme/onc following. Hgb stable. - transfusion threshold 7  Thrombocytopenia Likely secondary to uremia and MAHA. No active bleeding currently. - consider ddAVP if bleeding again - transfusion for platelets <20k or active bleeding  A Flutter Has had two episodes. Potentially triggered by pericardial effusion.Cardiology consulted, appreciate involvement. Has remained in NSR. CHADS2VASC is 1. Given thrombocytopenia, no anticoagulation.  - metoprolol tartrate as above - f/u cardiology recommendations  Covid vaccination Unvaccinated, interested in receiving. - will coordinate vaccination  FEN/GI: renal diet PPx: SCDs, consider restarting [place] if Hgb and platelets remain stable  Disposition: cardiac-tele  Subjective:  Patient seen this afternoon prior to HD. Feels okay, no complaints. Tolerated procedure well this AM. Denies chest pain. Still overwhelmed by his medical issues, interested in therapy when he leaves the hospital.  Objective: Temp:  [98 F (36.7 C)-98.9 F (37.2 C)] 98.9 F (37.2 C) (10/08 1011) Pulse Rate:  [70-84] 76 (10/08 1011) Resp:  [14-18] 17 (10/08 1011) BP: (121-159)/(75-108) 139/91 (10/08 1011) SpO2:  [99 %-100 %] 100 % (10/08 1011) Weight:  [78 kg-78.1 kg] 78.1 kg (10/08 0555) Physical Exam: General: Alert, sitting on side of bed, NAD Cardiovascular: RRR, no murmur Respiratory: CTAB, no wheezes or rales, no respiratory distress Abdomen: soft, non-tender, +BS Extremities: WWP, no edema  Laboratory: Recent Labs  Lab 11/22/19 0220 11/23/19 0325 11/24/19 0310  WBC 10.1 8.2 8.5  HGB 8.2* 7.3* 8.2*  HCT 25.1* 23.1* 26.2*  PLT 68* 63* 82*   Recent Labs  Lab 11/21/19 0018 11/21/19 0018 11/22/19 0220 11/23/19 0325 11/24/19 0310  NA 137   < > 136  137 137  K 3.3*   < > 3.4* 4.1 3.7  CL 95*    < > 97* 99 97*  CO2 16*   < > 18* 22 23  BUN 182*   < > 118* 57* 75*  CREATININE 23.62*   < > 17.79* 10.56* 12.67*  CALCIUM 8.6*   < > 8.9  8.5* 8.7* 9.1  PROT 6.3*  --  7.2 6.1*  --   BILITOT 2.4*  --  2.7* 1.9*  --   ALKPHOS 146*  --  167* 129*  --   ALT 12  --  14 22  --   AST 9*  --  15 21  --   GLUCOSE 102*   < > 89 84 92   < > = values in this interval not displayed.     Imaging/Diagnostic Tests: No new imaging.  Zola Button, MD 11/24/2019, 12:08 PM PGY-1, [place] Intern pager: [phone], text pages welcome

## 2019-11-24 NOTE — Op Note (Addendum)
OPERATIVE NOTE   PROCEDURE: 1. right brachiocephalic arteriovenous fistula placement 2. right cephalic vein thrombectomy  PRE-OPERATIVE DIAGNOSIS: ESRD  POST-OPERATIVE DIAGNOSIS: same as above   SURGEON: [name], MD  ASSISTANT(S): Leontine Locket, PA  ANESTHESIA: MAC  ESTIMATED BLOOD LOSS: Minimal  FINDING(S): 1. #2 fogarty was used to pull small plug of thrombus from cephalic vein at recent IV site in mid upper arm 2.  Cephalic vein: 3 mm, acceptable 3.  Brachial artery: 3.5 mm, disease free 4.  Venous outflow: palpable thrill  5.  Radial flow: palpable radial pulse  SPECIMEN(S):  none  INDICATIONS:   Samuel Burns is a 34 y.o. male who presents with ESRD and need for permanent hemodialysis access.  The patient is scheduled for right brachiocephalic arteriovenous fistula placement.  The patient is aware the risks include but are not limited to: bleeding, infection, steal syndrome, nerve damage, ischemic monomelic neuropathy, failure to mature, and need for additional procedures.  The patient is aware of the risks of the procedure and elects to proceed forward.  An assistant was needed for exposure and to expedite the case.   DESCRIPTION: After full informed written consent was obtained from the patient, the patient was brought back to the operating room and placed supine upon the operating table.  Prior to induction, the patient received IV antibiotics.   After obtaining adequate anesthesia, the patient was then prepped and draped in the standard fashion for a right arm access procedure.  I turned my attention first to identifying the patient's cephalic vein and brachial artery.  Using SonoSite guidance, the location of these vessels were marked out on the skin.   I did note a small plug of thrombus in the mid upper cephalic vein that was very focal.  At this point, I injected local anesthetic to obtain a field block of the antecubitum.  In total, I injected  about 10 mL of 1% lidocaine without epinephrine.  I made a transverse incision below the level of the antecubitum and dissected through the subcutaneous tissue and fascia to gain exposure of the brachial artery.  This was noted to be 3.5 mm in diameter externally.  This was dissected out proximally and distally and controlled with vessel loops .  I then dissected out the cephalic vein.  This was noted to be 3 mm in diameter externally.  The distal segment of the vein was ligated with a  2-0 silk, and the vein was transected.  The proximal segment was interrogated with serial dilators.  The vein accepted up to a 4 mm dilator without any difficulty.  I also passed a #2 Fogarty and retrieved small plug of thrombus from IV in the cephalic vein.  I then instilled the heparinized saline into the vein and clamped it.  At this point, I reset my exposure of the brachial artery.  The patient was given 3000 units IV heparin.  I then placed the artery under tension proximally and distally.  I made an arteriotomy with a #11 blade, and then I extended the arteriotomy with a Potts scissor.  I injected heparinized saline proximal and distal to this arteriotomy.  The vein was then sewn to the artery in an end-to-side configuration with a running stitch of 6-0 Prolene.  Prior to completing this anastomosis, I allowed the vein and artery to backbleed.  There was no evidence of clot from any vessels.  I completed the anastomosis in the usual fashion and then released all vessel  loops and clamps.    There was a palpable thrill in the venous outflow, and there was a palpable radial pulse.  At this point, I irrigated out the surgical wound.  There was no further active bleeding.  The subcutaneous tissue was reapproximated with a running stitch of 3-0 Vicryl.  The skin was then reapproximated with a running subcuticular stitch of 4-0 Monocryl.  The skin was then cleaned, dried, and reinforced with Dermabond.  The patient tolerated this  procedure well.   COMPLICATIONS: None  CONDITION: Stable   [name], MD [facility] Office: [phone]  11/24/2019, 9:04 AM  [name]

## 2019-11-24 NOTE — Progress Notes (Signed)
OT Cancellation    11/24/19 0700  OT Visit Information  Last OT Received On 11/24/19  Reason Eval/Treat Not Completed Patient at procedure or test/ unavailable (HD)  Maurie Boettcher, OT/L   Acute OT Clinical Specialist [place] Pager [phone] Office [phone]

## 2019-11-24 NOTE — TOC Transition Note (Signed)
Transition of Care [facility]) - CM/SW Discharge Note   Patient Details  Name: Samuel Burns MRN: [id number] Date of Birth: Dec 23, 1985  [facility]) CM/SW Contact:  Zenon Mayo, RN Phone Number: 11/24/2019, 3:50 PM   Clinical Narrative:    NCM contacted Zach with [employer] to see if he was working on charity for  3 n 1 and rolling walker from previous CSW note, he states yes but needed an order.  NCM put order in for him.  NCM made referral to [place] with [facility] for charity for [place].  Awaiting to here back from [place] with [facility].    Final next level of care: [place] Barriers to Discharge: Continued Medical Work up   Patient Goals and CMS Choice Patient states their goals for this hospitalization and ongoing recovery are:: get back to being independent, get back to work [employer].gov Compare Post Acute Care list provided to:: Patient Choice offered to / list presented to : Patient  Discharge Placement                       Discharge Plan and Services   Discharge Planning Services: CM Consult Post Acute Care Choice: Home Health          DME Arranged: 3-N-1, Walker rolling DME Agency: [employer] Date DME Agency Contacted: 11/24/19 Time DME Agency Contacted: [id number] Representative spoke with at DME Agency: [place]: PT [place]: [place] Date [place]: 11/24/19 Time [place]: [place] Representative spoke with at [place]: [place] ([place]) Interventions     Readmission Risk Interventions No flowsheet data found.

## 2019-11-24 NOTE — Progress Notes (Signed)
Patient will have a MWF schedule for outpatient HD at [facility] [place], at this point, as we are awaiting financial clearance.  Navigator met with patient at bedside to attempt to discuss feelings regarding transition to HD, however, patient was about to be taken to HD for treatment today. Navigator will follow up with him on Monday if able.  Navigator obtained signatures on [employer] [place] application for transportation and submitted to C. Rorie/Eligibility Coordinator.  Navigator informed patient that he will be here through the weekend and Navigator will follow up on financial clearance on Monday. Patient stated understanding.  Alphonzo Cruise, [place] Renal Navigator [phone]

## 2019-11-24 NOTE — Anesthesia Procedure Notes (Signed)
Procedure Name: MAC Date/Time: 11/24/2019 7:38 AM Performed by: Leonor Liv, CRNA Pre-anesthesia Checklist: Patient identified, Emergency Drugs available, Suction available, Timeout performed and Patient being monitored Patient Re-evaluated:Patient Re-evaluated prior to induction Oxygen Delivery Method: Simple face mask Placement Confirmation: positive ETCO2 Dental Injury: Teeth and Oropharynx as per pre-operative assessment

## 2019-11-24 NOTE — Transfer of Care (Signed)
Immediate Anesthesia Transfer of Care Note  Patient: Samuel Burns  Procedure(s) Performed: Creation of RIGHT ARM Brachial/Cephalic ARTERIOVENOUS (AV) FISTULA. (Right Arm Upper)  Patient Location: PACU  Anesthesia Type:MAC  Level of Consciousness: drowsy and patient cooperative  Airway & Oxygen Therapy: Patient Spontanous Breathing  Post-op Assessment: Report given to RN, Post -op Vital signs reviewed and stable and Patient moving all extremities  Post vital signs: Reviewed and stable  Last Vitals:  Vitals Value Taken Time  BP 121/75 11/24/19 0918  Temp 36.7 C 11/24/19 0918  Pulse 72 11/24/19 0927  Resp 17 11/24/19 0927  SpO2 100 % 11/24/19 0927  Vitals shown include unvalidated device data.  Last Pain:  Vitals:   11/24/19 0918  TempSrc:   PainSc: Asleep         Complications: No complications documented.

## 2019-11-24 NOTE — Progress Notes (Addendum)
[facility] Progress Note   Subjective:   S/p R BC AVF this AM, feels fine. Appetite fair.     Objective Vitals:   11/24/19 0945 11/24/19 0948 11/24/19 0950 11/24/19 1011  BP:  131/86  (!) 139/91  Pulse: 72 72 73 76  Resp: 17 15 18 17   Temp:   98.6 F (37 C) 98.9 F (37.2 C)  TempSrc:    Oral  SpO2: 100% 100% 100% 100%  Weight:      Height:       Physical Exam General: sitting at edge of bed Heart: RRR, no rub Lungs: clear Abdomen: soft, nontender Extremities: no edema, RUE incision c/d/i, intact distal pulse Dialysis Access:  [facility] c/d/i Neuro:  Conversant, nonfocal  Additional Objective Labs: Basic Metabolic Panel: Recent Labs  Lab 11/20/19 0428 11/21/19 0018 11/22/19 0220 11/23/19 0325 11/24/19 0310  NA 134*   < > 136 137 137  K 3.7   < > 3.4* 4.1 3.7  CL 92*   < > 97* 99 97*  CO2 13*   < > 18* 22 23  GLUCOSE 109*   < > 89 84 92  BUN 266*   < > 118* 57* 75*  CREATININE 35.25*   < > 17.79* 10.56* 12.67*  CALCIUM 8.7*   < > 8.9  8.5* 8.7* 9.1  PHOS >30.0*  --  8.5* 5.9*  --    < > = values in this interval not displayed.   Liver Function Tests: Recent Labs  Lab 11/21/19 0018 11/22/19 0220 11/23/19 0325  AST 9* 15 21  ALT 12 14 22   ALKPHOS 146* 167* 129*  BILITOT 2.4* 2.7* 1.9*  PROT 6.3* 7.2 6.1*  ALBUMIN 3.4* 3.7 3.2*   Recent Labs  Lab 11/19/19 0906  LIPASE 169*   CBC: Recent Labs  Lab 11/21/19 0712 11/21/19 0712 11/21/19 1313 11/21/19 1313 11/22/19 0220 11/23/19 0325 11/24/19 0310  WBC 9.7   < > 11.9*   < > 10.1 8.2 8.5  HGB 7.0*   < > 8.0*   < > 8.2* 7.3* 8.2*  HCT 21.0*   < > 24.0*   < > 25.1* 23.1* 26.2*  MCV 89.4  --  91.3  --  91.6 96.7 97.4  PLT 70*   < > 71*   < > 68* 63* 82*   < > = values in this interval not displayed.   Blood Culture    Component Value Date/Time   SDES BLOOD RIGHT HAND 11/20/2019 0008   SPECREQUEST  11/20/2019 0008    BOTTLES DRAWN AEROBIC AND ANAEROBIC Blood Culture adequate  volume   CULT  11/20/2019 0008    NO GROWTH 4 DAYS Performed at [facility], [place] [address] [place], [place] [address]    REPTSTATUS PENDING 11/20/2019 0008    Cardiac Enzymes: No results for input(s): CKTOTAL, CKMB, CKMBINDEX, TROPONINI in the last 168 hours. CBG: No results for input(s): GLUCAP in the last 168 hours. Iron Studies:  No results for input(s): IRON, TIBC, TRANSFERRIN, FERRITIN in the last 72 hours. @lablastinr3 @ Studies/Results: VAS [place] UPPER EXT VEIN MAPPING (PRE-OP AVF)  Result Date: 11/23/2019 UPPER EXTREMITY VEIN MAPPING  Indications: Pre-access. Performing Technologist: Abram Sander RVS  Examination Guidelines: A complete evaluation includes B-mode imaging, spectral Doppler, color Doppler, and power Doppler as needed of all accessible portions of each vessel. Bilateral testing is considered an integral part of a complete examination. Limited examinations for reoccurring indications may be performed  as noted. +-----------------+-------------+----------+---------+ Right Cephalic   Diameter (cm)Depth (cm)Findings  +-----------------+-------------+----------+---------+ Shoulder             0.27        0.51             +-----------------+-------------+----------+---------+ Prox upper arm       0.23        0.41             +-----------------+-------------+----------+---------+ Mid upper arm        0.26        0.36             +-----------------+-------------+----------+---------+ Dist upper arm       0.29        0.42             +-----------------+-------------+----------+---------+ Antecubital fossa    0.35        0.52             +-----------------+-------------+----------+---------+ Prox forearm         0.20        0.47             +-----------------+-------------+----------+---------+ Mid forearm          0.21        0.49   branching +-----------------+-------------+----------+---------+ Dist forearm         0.21        0.43              +-----------------+-------------+----------+---------+ Wrist                0.19        0.41             +-----------------+-------------+----------+---------+ +-----------------+-------------+----------+--------+ Right Basilic    Diameter (cm)Depth (cm)Findings +-----------------+-------------+----------+--------+ Prox upper arm       0.28        1.22            +-----------------+-------------+----------+--------+ Mid upper arm        0.28        0.83            +-----------------+-------------+----------+--------+ Dist upper arm       0.20        0.68            +-----------------+-------------+----------+--------+ Antecubital fossa    0.17        0.42            +-----------------+-------------+----------+--------+ Prox forearm         0.21        0.40            +-----------------+-------------+----------+--------+ Mid forearm          0.16        0.31            +-----------------+-------------+----------+--------+ Distal forearm       0.16        0.34            +-----------------+-------------+----------+--------+ +-----------------+-------------+----------+---------+ Left Cephalic    Diameter (cm)Depth (cm)Findings  +-----------------+-------------+----------+---------+ Shoulder             0.16        0.52             +-----------------+-------------+----------+---------+ Prox upper arm       0.16        0.47             +-----------------+-------------+----------+---------+ Mid upper arm  0.19        0.47             +-----------------+-------------+----------+---------+ Dist upper arm       0.19        0.58             +-----------------+-------------+----------+---------+ Antecubital fossa    0.39        1.03             +-----------------+-------------+----------+---------+ Prox forearm         0.23        0.45   branching +-----------------+-------------+----------+---------+ Mid forearm          0.17         0.44             +-----------------+-------------+----------+---------+ Dist forearm         0.18        0.43             +-----------------+-------------+----------+---------+ Wrist                0.17        0.42             +-----------------+-------------+----------+---------+ +-----------------+-------------+----------+--------------+ Left Basilic     Diameter (cm)Depth (cm)   Findings    +-----------------+-------------+----------+--------------+ Prox upper arm       0.23        1.13                  +-----------------+-------------+----------+--------------+ Mid upper arm        0.24        1.25                  +-----------------+-------------+----------+--------------+ Dist upper arm       0.26        0.87     branching    +-----------------+-------------+----------+--------------+ Antecubital fossa    0.31        0.64     branching    +-----------------+-------------+----------+--------------+ Prox forearm                            not visualized +-----------------+-------------+----------+--------------+ Mid forearm                             not visualized +-----------------+-------------+----------+--------------+ Distal forearm                          not visualized +-----------------+-------------+----------+--------------+ Elbow                                   not visualized +-----------------+-------------+----------+--------------+ Wrist                                   not visualized +-----------------+-------------+----------+--------------+ *See table(s) above for measurements and observations.  Diagnosing physician: Monica Martinez MD Electronically signed by Monica Martinez MD on 11/23/2019 at 3:16:26 PM.    Final    Medications: .  ceFAZolin (ANCEF) IV     . sodium chloride   Intravenous Once  . amLODipine  10 mg Oral Daily  . Chlorhexidine Gluconate Cloth  6 each Topical Q0600  . darbepoetin (ARANESP) injection -  DIALYSIS  60 mcg Intravenous Q Wed-HD  .  losartan  25 mg Oral Daily  . melatonin  5 mg Oral QHS  . metoprolol tartrate  50 mg Oral Q8H  . sevelamer carbonate  800 mg Oral TID WC    Assessment/Plan: 1.  ESRD: The available data points to end-stage renal disease.  s/p IR TDC today with HD #1 Mon , #2 Tues, #3 Wed; next today. CLIP underway. S/p R BC AVF 11/24/19 - appreciate VVS.  Discussed transplant process with him.   2.  Hypertensive urgency: resovled - now normotensive on po meds.  3.  Anemia: Likely anemia of chronic kidney disease perhaps with some hemolysis with HTN emergency.    1u pRBC 10/4 .Initiated ESA now that BP improved, ferritin > 1000, hold IV iron. 4.  Secondary hyperparathyroidism: PTH 208.  His calcium level is within acceptable range.  Phos 5.9 - renal diet and sevelamer. 5.  Elevated troponin levels: Cardiology following, not thought to be ACS. 6.  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  ADAMTS13 low but not consistent with TTS.  7.  Metabolic acidosis, Anion Gap:  Likely secondary to CKD, improving with HD, d/c po bicarb. 8.  A flutter:  BB per cardiology.  TTE with mod pericardial effusion without tamponade thought to be uremic in etiology --> plan to recheck in 2-3 weeks.  Holding [facility] for now with anemia, they will follow.  Jannifer Hick MD 11/24/2019, 11:53 AM  [facility] Pager: [phone]

## 2019-11-24 NOTE — Anesthesia Postprocedure Evaluation (Signed)
Anesthesia Post Note  Patient: Hydrographic surveyor  Procedure(s) Performed: Creation of RIGHT ARM Brachial/Cephalic ARTERIOVENOUS (AV) FISTULA. (Right Arm Upper)     Patient location during evaluation: PACU Anesthesia Type: MAC Level of consciousness: awake and alert Pain management: pain level controlled Vital Signs Assessment: post-procedure vital signs reviewed and stable Respiratory status: spontaneous breathing and respiratory function stable Cardiovascular status: stable Postop Assessment: no apparent nausea or vomiting Anesthetic complications: no   No complications documented.  Last Vitals:  Vitals:   11/24/19 0950 11/24/19 1011  BP:  (!) 139/91  Pulse: 73 76  Resp: 18 17  Temp: 37 C 37.2 C  SpO2: 100% 100%    Last Pain:  Vitals:   11/24/19 1011  TempSrc: Oral  PainSc:                  [place]

## 2019-11-24 NOTE — Progress Notes (Deleted)
Patient still confuse, pulling off ivs and purewicks. MD is aware, ordered some test and new medication.  patient is frequently being reoriented to her current environment.

## 2019-11-24 NOTE — Progress Notes (Signed)
FPTS Interim Progress Note  S: Interviewed patient at bedside.  Went to see patient post AV fistula formation. He reports tolerating the procedure well, have no pain or swelling. Reports no abdominal pain at present. He has no complaints at this time.  O: BP (!) 138/91 (BP Location: Left Arm)   Pulse 90   Temp 98 F (36.7 C) (Oral)   Resp 18   Ht 5\' 7"  (1.702 m)   Wt 83 kg   SpO2 100%   BMI 28.66 kg/m   Physical Exam Vitals and nursing note reviewed.  Constitutional:      General: He is not in acute distress.    Appearance: Normal appearance. He is normal weight. He is not ill-appearing, toxic-appearing or diaphoretic.  HENT:     Head: Normocephalic and atraumatic.  Cardiovascular:     Rate and Rhythm: Normal rate and regular rhythm.     Pulses: Normal pulses.          Radial pulses are 2+ on the right side and 2+ on the left side.       Dorsalis pedis pulses are 2+ on the right side and 2+ on the left side.     Heart sounds: Normal heart sounds, S1 normal and S2 normal. No murmur heard.   Pulmonary:     Effort: Pulmonary effort is normal. No respiratory distress.     Breath sounds: Normal breath sounds. No wheezing.  Abdominal:     General: There is no distension.     Tenderness: There is no abdominal tenderness.  Musculoskeletal:     Right lower leg: No edema.     Left lower leg: No edema.  Neurological:     Mental Status: He is alert. Mental status is at baseline.      A/P: Currently stable will not make any changes to current plans.   Briant Cedar, MD 11/24/2019, 8:55 PM PGY-1, [place] Medicine Service pager [phone]

## 2019-11-24 NOTE — Progress Notes (Signed)
[facility]  Subjective  - no complaints.   Objective (!) 147/102 84 98.6 F (37 C) (Oral) 16 100%  Intake/Output Summary (Last 24 hours) at 11/24/2019 0731 Last data filed at 11/24/2019 0722 Gross per 24 hour  Intake 150 ml  Output 120 ml  Net 30 ml    Right radial and brachial pulses palpable  Laboratory Lab Results: Recent Labs    11/23/19 0325 11/24/19 0310  WBC 8.2 8.5  HGB 7.3* 8.2*  HCT 23.1* 26.2*  PLT 63* 82*   BMET Recent Labs    11/23/19 0325 11/24/19 0310  NA 137 137  K 4.1 3.7  CL 99 97*  CO2 22 23  GLUCOSE 84 92  BUN 57* 75*  CREATININE 10.56* 12.67*  CALCIUM 8.7* 9.1    COAG Lab Results  Component Value Date   INR 1.1 11/20/2019   No results found for: PTT  Assessment/Planning:  34 yo M with likely ESRD and vascular was consulted for permanent dialysis access.  Plan right arm AVF after risks and benefits discussed.  [name] 11/24/2019 7:31 AM --

## 2019-11-24 NOTE — Progress Notes (Signed)
Per pt request called spouse, [place], to inform her pt had been picked up by transport for OR procedure this am. Spouse stated she will visit patient approximately 17:00-17:30 & would like Chaplain to visit at this time.  This RN explained to spouse the above information would be noted and passed in report to oncoming RN.

## 2019-11-25 ENCOUNTER — Encounter (HOSPITAL_COMMUNITY): Payer: Self-pay | Admitting: Vascular Surgery

## 2019-11-25 LAB — BASIC METABOLIC PANEL
Anion gap: 13 (ref 5–15)
BUN: 34 mg/dL — ABNORMAL HIGH (ref 6–20)
CO2: 26 mmol/L (ref 22–32)
Calcium: 8.7 mg/dL — ABNORMAL LOW (ref 8.9–10.3)
Chloride: 98 mmol/L (ref 98–111)
Creatinine, Ser: 7.81 mg/dL — ABNORMAL HIGH (ref 0.61–1.24)
GFR, Estimated: 8 mL/min — ABNORMAL LOW (ref >60)
Glucose, Bld: 88 mg/dL (ref 70–99)
Potassium: 3.9 mmol/L (ref 3.5–5.1)
Sodium: 137 mmol/L (ref 135–145)

## 2019-11-25 LAB — CBC
HCT: 29.1 % — ABNORMAL LOW (ref 39.0–52.0)
Hemoglobin: 8.9 g/dL — ABNORMAL LOW (ref 13.0–17.0)
MCH: 30.5 pg (ref 26.0–34.0)
MCHC: 30.6 g/dL (ref 30.0–36.0)
MCV: 99.7 fL (ref 80.0–100.0)
Platelets: 126 10*3/uL — ABNORMAL LOW (ref 150–400)
RBC: 2.92 MIL/uL — ABNORMAL LOW (ref 4.22–5.81)
RDW: 17.3 % — ABNORMAL HIGH (ref 11.5–15.5)
WBC: 9 10*3/uL (ref 4.0–10.5)
nRBC: 0 % (ref 0.0–0.2)

## 2019-11-25 LAB — CULTURE, BLOOD (ROUTINE X 2)
Culture: NO GROWTH
Culture: NO GROWTH
Special Requests: ADEQUATE

## 2019-11-25 MED ORDER — HEPARIN SODIUM (PORCINE) 5000 UNIT/ML IJ SOLN
5000.0000 [IU] | Freq: Three times a day (TID) | INTRAMUSCULAR | Status: DC
  Administered 2019-11-25 – 2019-11-27 (×7): 5000 [IU] via SUBCUTANEOUS
  Filled 2019-11-25 (×8): qty 1

## 2019-11-25 NOTE — Plan of Care (Signed)
Cardiology POC Note:  Atrial Flutter in the setting of likely Uremic Pericardial Effusion Telemetry Reviewed:  Sinus Rhythm - agree with limited echo in follow up (10/19-10/26) - may be able to decrease metoprolol to BID dosing - will peripherally follow unless new issues occur.   Rudean Haskell, [place], [address] [place], [place] [address] [phone]  10:24 AM

## 2019-11-25 NOTE — Care Management (Signed)
Notified by [place] with [facility] that charity [facility] has been declined.

## 2019-11-25 NOTE — Progress Notes (Signed)
  Progress Note    11/25/2019 9:25 AM 1 Day Post-Op  Subjective:  No complaints  Vitals:   11/25/19 0429 11/25/19 0646  BP: 131/82 (!) 140/92  Pulse: 81 85  Resp: 16   Temp: 98.2 F (36.8 C)   SpO2: 100%     Physical Exam: aaox3 Strong thrill right upper arm Palpable radial pulse on the right Incision cdi Hand strength 5/5 CBC    Component Value Date/Time   WBC 9.0 11/25/2019 0703   RBC 2.92 (L) 11/25/2019 0703   HGB 8.9 (L) 11/25/2019 0703   HCT 29.1 (L) 11/25/2019 0703   PLT 126 (L) 11/25/2019 0703   MCV 99.7 11/25/2019 0703   MCH 30.5 11/25/2019 0703   MCHC 30.6 11/25/2019 0703   RDW 17.3 (H) 11/25/2019 0703    BMET    Component Value Date/Time   NA 137 11/25/2019 0703   NA 142 04/08/2017 1520   K 3.9 11/25/2019 0703   CL 98 11/25/2019 0703   CO2 26 11/25/2019 0703   GLUCOSE 88 11/25/2019 0703   BUN 34 (H) 11/25/2019 0703   BUN 17 04/08/2017 1520   CREATININE 7.81 (H) 11/25/2019 0703   CALCIUM 8.7 (L) 11/25/2019 0703   CALCIUM 8.5 (L) 11/22/2019 0220   GFRNONAA 8 (L) 11/25/2019 0703   GFRAA 2 (L) 11/21/2019 0018    INR    Component Value Date/Time   INR 1.1 11/20/2019 1105     Intake/Output Summary (Last 24 hours) at 11/25/2019 0925 Last data filed at 11/25/2019 0651 Gross per 24 hour  Intake 495 ml  Output 0 ml  Net 495 ml     Assessment:  34 y.o. male is s/p:  1. right brachiocephalic arteriovenous fistula placement 2. right cephalic vein thrombectomy  Plan: Vascular available as needed, will arrange f/u in 4-6 weeks with right arm dialysis duplex  Aydan Phoenix C. Donzetta Matters, MD [facility] [place] Office: [phone] Pager: [phone]  11/25/2019 9:25 AM

## 2019-11-25 NOTE — Progress Notes (Signed)
Re-round on patient status post fistula placement. Educated patient and girlfriend on how to feel the thrill and monitor should it stop. Educated patient to reach out to his employer HR for FMLA paperwork. No further questions at this time. Will follow as appropriate.  Dorthey Sawyer, RN  Dialysis Nurse Coordinator

## 2019-11-25 NOTE — Progress Notes (Signed)
Family Medicine Teaching Service Daily Progress Note Intern Pager: [phone]  Patient name: Christohper Dube Medical record number: [id number] Date of birth: 1985-05-12 Age: 34 y.o. Gender: male  Primary Care Provider: Patient, No Pcp Per Consultants: Nephrology, IR, cardiology, general surgery, CCM, heme/onc Code Status: Full  Pt Overview and Major Events to Date:  Admitted on 10/3 Placement of Tunneled HD Cath and starting of HD on 10/4 VF Placed on 10/8   Assessment and Plan:  Mr. Loftin is a 34 yr old male presenting with nausea and vomiting found to have hypertensive crisis with acute renal failure. PMHx is significant forHTN.   Acute renal failure  Outpatient Dialysis: Most likely etiology malignant hypertension.Appreciate nephrology involvement. Tunneled Cath placed 10/3. Dialysis on 10/4, 10/5, 10/6, 10/8. AVF access placed 58/8 without complication. Working with Dialysis Coordinator to set up outpatient dialysis. Awaiting financial clearance. -Follow up with Dialysis Coordinator on Monday for financial clearance   Hypertensive Emergency(resolved): Initially presented with significant hypertension 248/137 on admission, now resolved and adequately controlled on oral medications. BP this morning is 131/82. -Continue Amlodipine 10 mg daily -Continue Metoprolol tartrate 50 mg q8h, can increase to 100 mg BID if needed per cardiology -Continue Losartan 25 mg daily   Type 2 NSTEMI (resolved): Has been without chest pain or SOB, no ST elevations on EKG. No further workup per cardiology. - EKG if symptomatic or change in clinical status   HFpEF Newly diagnosed, likely chronic. Not in exacerbation.BNP 1,267 on admission.Secondary to HTN. TTE with EF 50-55%, G2DD, severe concentric LVH. -Continue HTN treatment as above   Pericardial effusion: Moderate pericardial effusion seen on TTE without signs of cardiac tamponade. Likely secondary to uremia. Will continue to  monitor. -Repeat TTE in 2-3 weeks   QTc prolongation (resolved):   Back pain (resolved):   Normocytic anemia: Likely anemia of chronic inflammation and MAHA secondary to malignant hypertension. TTP and DIC ruled out. Heme/onc following. Hgb 8.2 yesterday. -Transfusion threshold 7   Thrombocytopenia Likely secondary to uremia and MAHA. No active bleeding currently. -Consider ddAVP if bleeding again -Transfusion for platelets <20k or active bleeding   A Flutter Has had two episodes. Potentially triggered by pericardial effusion.Cardiology consulted, appreciate involvement.Has remained in NSR.CHADS2VASC is 1. Given thrombocytopenia, no anticoagulation. -Continue Metoprolol tartrate 50 mg q8h - f/u cardiology recommendations   Covid vaccination Unvaccinated, interested in receiving. - will coordinate vaccination   FEN/GI: Renal Diet PPx: SCD's, can restart [facility] once CBC labs return  Disposition: Cardiac tele  Prior to Admission Living Arrangement: Home Anticipated Discharge Location: Home Barriers to Discharge: Dialysis coordination Anticipated discharge in approximately 2-4 day(s).   Subjective:  Interviewed patient at bedside.  He reports doing well this morning. He reports no pain at the site of his AVF. He has no complaints at present.   Objective: Temp:  [97.9 F (36.6 C)-98.9 F (37.2 C)] 98 F (36.7 C) (10/08 2048) Pulse Rate:  [70-90] 89 (10/08 2142) Resp:  [14-21] 18 (10/08 2048) BP: (121-151)/(52-102) 132/90 (10/08 2142) SpO2:  [100 %] 100 % (10/08 2048) Weight:  [78.1 kg-83 kg] 83 kg (10/08 1845) Physical Exam:  Physical Exam Vitals and nursing note reviewed.  Constitutional:      General: He is not in acute distress.    Appearance: Normal appearance. He is normal weight. He is not ill-appearing, toxic-appearing or diaphoretic.  HENT:     Head: Normocephalic and atraumatic.  Cardiovascular:     Rate and Rhythm: Normal rate and regular  rhythm.  Pulses: Normal pulses.          Radial pulses are 2+ on the right side and 2+ on the left side.       Dorsalis pedis pulses are 2+ on the right side and 2+ on the left side.     Heart sounds: Normal heart sounds, S1 normal and S2 normal. No murmur heard.   Pulmonary:     Effort: Pulmonary effort is normal. No respiratory distress.     Breath sounds: Normal breath sounds. No wheezing.  Abdominal:     General: There is no distension.     Tenderness: There is no abdominal tenderness.  Musculoskeletal:     Right lower leg: No edema.     Left lower leg: No edema.  Neurological:     Mental Status: He is alert. Mental status is at baseline.      Laboratory: Recent Labs  Lab 11/22/19 0220 11/23/19 0325 11/24/19 0310  WBC 10.1 8.2 8.5  HGB 8.2* 7.3* 8.2*  HCT 25.1* 23.1* 26.2*  PLT 68* 63* 82*   Recent Labs  Lab 11/21/19 0018 11/21/19 0018 11/22/19 0220 11/23/19 0325 11/24/19 0310  NA 137   < > 136 137 137  K 3.3*   < > 3.4* 4.1 3.7  CL 95*   < > 97* 99 97*  CO2 16*   < > 18* 22 23  BUN 182*   < > 118* 57* 75*  CREATININE 23.62*   < > 17.79* 10.56* 12.67*  CALCIUM 8.6*   < > 8.9  8.5* 8.7* 9.1  PROT 6.3*  --  7.2 6.1*  --   BILITOT 2.4*  --  2.7* 1.9*  --   ALKPHOS 146*  --  167* 129*  --   ALT 12  --  14 22  --   AST 9*  --  15 21  --   GLUCOSE 102*   < > 89 84 92   < > = values in this interval not displayed.    Results for orders placed or performed during the hospital encounter of 11/19/19 (from the past 24 hour(s))  Hepatitis B surface antigen     Status: None   Collection Time: 11/24/19  3:11 PM  Result Value Ref Range   Hepatitis B Surface Ag NON REACTIVE NON REACTIVE  Hepatitis B surface antibody     Status: Abnormal   Collection Time: 11/24/19  3:23 PM  Result Value Ref Range   Hep B S Ab Reactive (A) NON REACTIVE  Hepatitis B core antibody, total     Status: None   Collection Time: 11/24/19  3:23 PM  Result Value Ref Range   Hep B Core  Total Ab NON REACTIVE NON REACTIVE    Imaging/Diagnostic Tests:   US Abdomen Limited RUQ: Findings are equivocal for acute cholecystitis. While there is gallbladder wall thickening with pericholecystic free fluid in the presence of gallbladder sludge, the sonographic Murphy sign is negative. Consider further evaluation with HIDA scan as clinically indicated. Coarsened and heterogeneous appearance of the liver with somewhat echogenic portal triads. Findings may indicate underlying hepatocellular disease or acute hepatitis. Echogenic right kidney concerning for medical renal disease.   CT Chest WO Contrast: See CT Abdomen Pelvis WO Contrast results below   CT Abdomen Pelvis WO Contrast: The gallbladder is distended with wall thickening. No radiopaque calculi identified. No biliary ductal dilatation. Findings are concerning for acute cholecystitis. Predominantly dependent bibasilar heterogeneous atelectasis or consolidation. Findings are  concerning for infection or aspiration. No obvious evidence of aortic aneurysm or dissection, however please note that noncontrast CT is very limited for the evaluation of aortic dissection and other acute aortic pathology. Cardiomegaly.   US Aorta: Negative for abdominal aortic aneurysm.   DG Chest 2 View: No evidence of acute cardiopulmonary disease.   Briant Cedar, MD 11/25/2019, 3:49 AM PGY-1, [place] Intern pager: [phone], text pages welcome

## 2019-11-25 NOTE — Progress Notes (Signed)
[facility] Progress Note   Subjective:   S/p R BC AVF yestserday this AM, feels fine. Appetite fair.     Objective Vitals:   11/24/19 2142 11/25/19 0429 11/25/19 0500 11/25/19 0646  BP: 132/90 131/82  (!) 140/92  Pulse: 89 81  85  Resp:  16    Temp:  98.2 F (36.8 C)    TempSrc:  Oral    SpO2:  100%    Weight:   82.4 kg   Height:       Physical Exam General: sitting at edge of bed Heart: RRR, no rub Lungs: clear Abdomen: soft, nontender Extremities: no edema, RUE incision c/d/i, intact distal pulse, +t/b present in fistula vein Dialysis Access:  [facility] c/d/i Neuro:  Conversant, nonfocal  Additional Objective Labs: Basic Metabolic Panel: Recent Labs  Lab 11/20/19 0428 11/21/19 0018 11/22/19 0220 11/23/19 0325 11/24/19 0310  NA 134*   < > 136 137 137  K 3.7   < > 3.4* 4.1 3.7  CL 92*   < > 97* 99 97*  CO2 13*   < > 18* 22 23  GLUCOSE 109*   < > 89 84 92  BUN 266*   < > 118* 57* 75*  CREATININE 35.25*   < > 17.79* 10.56* 12.67*  CALCIUM 8.7*   < > 8.9  8.5* 8.7* 9.1  PHOS >30.0*  --  8.5* 5.9*  --    < > = values in this interval not displayed.   Liver Function Tests: Recent Labs  Lab 11/21/19 0018 11/22/19 0220 11/23/19 0325  AST 9* 15 21  ALT 12 14 22   ALKPHOS 146* 167* 129*  BILITOT 2.4* 2.7* 1.9*  PROT 6.3* 7.2 6.1*  ALBUMIN 3.4* 3.7 3.2*   Recent Labs  Lab 11/19/19 0906  LIPASE 169*   CBC: Recent Labs  Lab 11/21/19 0712 11/21/19 0712 11/21/19 1313 11/21/19 1313 11/22/19 0220 11/23/19 0325 11/24/19 0310  WBC 9.7   < > 11.9*   < > 10.1 8.2 8.5  HGB 7.0*   < > 8.0*   < > 8.2* 7.3* 8.2*  HCT 21.0*   < > 24.0*   < > 25.1* 23.1* 26.2*  MCV 89.4  --  91.3  --  91.6 96.7 97.4  PLT 70*   < > 71*   < > 68* 63* 82*   < > = values in this interval not displayed.   Blood Culture    Component Value Date/Time   SDES BLOOD RIGHT HAND 11/20/2019 0008   SPECREQUEST  11/20/2019 0008    BOTTLES DRAWN AEROBIC AND ANAEROBIC Blood  Culture adequate volume   CULT  11/20/2019 0008    NO GROWTH 5 DAYS Performed at [facility], [place] [address], [place], [place] [address]    REPTSTATUS 11/25/2019 FINAL 11/20/2019 0008    Cardiac Enzymes: No results for input(s): CKTOTAL, CKMB, CKMBINDEX, TROPONINI in the last 168 hours. CBG: No results for input(s): GLUCAP in the last 168 hours. Iron Studies:  No results for input(s): IRON, TIBC, TRANSFERRIN, FERRITIN in the last 72 hours. @lablastinr3 @ Studies/Results: VAS [place] UPPER EXT VEIN MAPPING (PRE-OP AVF)  Result Date: 11/23/2019 UPPER EXTREMITY VEIN MAPPING  Indications: Pre-access. Performing Technologist: Abram Sander RVS  Examination Guidelines: A complete evaluation includes B-mode imaging, spectral Doppler, color Doppler, and power Doppler as needed of all accessible portions of each vessel. Bilateral testing is considered an integral part of a complete examination. Limited examinations for  reoccurring indications may be performed as noted. +-----------------+-------------+----------+---------+ Right Cephalic   Diameter (cm)Depth (cm)Findings  +-----------------+-------------+----------+---------+ Shoulder             0.27        0.51             +-----------------+-------------+----------+---------+ Prox upper arm       0.23        0.41             +-----------------+-------------+----------+---------+ Mid upper arm        0.26        0.36             +-----------------+-------------+----------+---------+ Dist upper arm       0.29        0.42             +-----------------+-------------+----------+---------+ Antecubital fossa    0.35        0.52             +-----------------+-------------+----------+---------+ Prox forearm         0.20        0.47             +-----------------+-------------+----------+---------+ Mid forearm          0.21        0.49   branching +-----------------+-------------+----------+---------+ Dist forearm          0.21        0.43             +-----------------+-------------+----------+---------+ Wrist                0.19        0.41             +-----------------+-------------+----------+---------+ +-----------------+-------------+----------+--------+ Right Basilic    Diameter (cm)Depth (cm)Findings +-----------------+-------------+----------+--------+ Prox upper arm       0.28        1.22            +-----------------+-------------+----------+--------+ Mid upper arm        0.28        0.83            +-----------------+-------------+----------+--------+ Dist upper arm       0.20        0.68            +-----------------+-------------+----------+--------+ Antecubital fossa    0.17        0.42            +-----------------+-------------+----------+--------+ Prox forearm         0.21        0.40            +-----------------+-------------+----------+--------+ Mid forearm          0.16        0.31            +-----------------+-------------+----------+--------+ Distal forearm       0.16        0.34            +-----------------+-------------+----------+--------+ +-----------------+-------------+----------+---------+ Left Cephalic    Diameter (cm)Depth (cm)Findings  +-----------------+-------------+----------+---------+ Shoulder             0.16        0.52             +-----------------+-------------+----------+---------+ Prox upper arm       0.16        0.47             +-----------------+-------------+----------+---------+ Mid upper arm  0.19        0.47             +-----------------+-------------+----------+---------+ Dist upper arm       0.19        0.58             +-----------------+-------------+----------+---------+ Antecubital fossa    0.39        1.03             +-----------------+-------------+----------+---------+ Prox forearm         0.23        0.45   branching +-----------------+-------------+----------+---------+ Mid  forearm          0.17        0.44             +-----------------+-------------+----------+---------+ Dist forearm         0.18        0.43             +-----------------+-------------+----------+---------+ Wrist                0.17        0.42             +-----------------+-------------+----------+---------+ +-----------------+-------------+----------+--------------+ Left Basilic     Diameter (cm)Depth (cm)   Findings    +-----------------+-------------+----------+--------------+ Prox upper arm       0.23        1.13                  +-----------------+-------------+----------+--------------+ Mid upper arm        0.24        1.25                  +-----------------+-------------+----------+--------------+ Dist upper arm       0.26        0.87     branching    +-----------------+-------------+----------+--------------+ Antecubital fossa    0.31        0.64     branching    +-----------------+-------------+----------+--------------+ Prox forearm                            not visualized +-----------------+-------------+----------+--------------+ Mid forearm                             not visualized +-----------------+-------------+----------+--------------+ Distal forearm                          not visualized +-----------------+-------------+----------+--------------+ Elbow                                   not visualized +-----------------+-------------+----------+--------------+ Wrist                                   not visualized +-----------------+-------------+----------+--------------+ *See table(s) above for measurements and observations.  Diagnosing physician: Monica Martinez MD Electronically signed by Monica Martinez MD on 11/23/2019 at 3:16:26 PM.    Final    Medications:  . sodium chloride   Intravenous Once  . amLODipine  10 mg Oral Daily  . Chlorhexidine Gluconate Cloth  6 each Topical Q0600  . darbepoetin (ARANESP) injection  - DIALYSIS  60 mcg Intravenous Q Wed-HD  . losartan  25 mg Oral Daily  .  melatonin  5 mg Oral QHS  . metoprolol tartrate  50 mg Oral Q8H  . sevelamer carbonate  800 mg Oral TID WC    Assessment/Plan: 1.  ESRD: The available data points to end-stage renal disease.  s/p IR TDC today with HD #1 10/4, last was 10/8.  Plan next on 10/11.  CLIP underway. S/p R BC AVF 11/24/19 - appreciate VVS.  Discussed transplant process with him.   2.  Hypertensive urgency: resovled - now normotensive on po meds.  3.  Anemia: Likely anemia of chronic kidney disease perhaps with some hemolysis with HTN emergency.    1u pRBC 10/4 .Initiated ESA now that BP improved, ferritin > 1000, hold IV iron. 4.  Secondary hyperparathyroidism: PTH 208.  His calcium level is within acceptable range.  Phos 5.9 - renal diet and sevelamer. 5.  Elevated troponin levels: Cardiology following, not thought to be ACS. 6.  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  ADAMTS13 low but not consistent with TTS.  7.  Metabolic acidosis, Anion Gap:  Likely secondary to CKD, improving with HD, d/c po bicarb. 8.  A flutter:  BB per cardiology.  TTE with mod pericardial effusion without tamponade thought to be uremic in etiology --> plan to recheck in 2-3 weeks.  Holding [facility] for now with anemia, they will follow.  Jannifer Hick MD 11/25/2019, 8:10 AM  [facility] Pager: [phone]

## 2019-11-25 NOTE — Plan of Care (Signed)

## 2019-11-26 LAB — CBC
HCT: 25.2 % — ABNORMAL LOW (ref 39.0–52.0)
Hemoglobin: 7.8 g/dL — ABNORMAL LOW (ref 13.0–17.0)
MCH: 30.7 pg (ref 26.0–34.0)
MCHC: 31 g/dL (ref 30.0–36.0)
MCV: 99.2 fL (ref 80.0–100.0)
Platelets: 168 10*3/uL (ref 150–400)
RBC: 2.54 MIL/uL — ABNORMAL LOW (ref 4.22–5.81)
RDW: 16.9 % — ABNORMAL HIGH (ref 11.5–15.5)
WBC: 8.6 10*3/uL (ref 4.0–10.5)
nRBC: 0 % (ref 0.0–0.2)

## 2019-11-26 LAB — BASIC METABOLIC PANEL
Anion gap: 15 (ref 5–15)
BUN: 41 mg/dL — ABNORMAL HIGH (ref 6–20)
CO2: 24 mmol/L (ref 22–32)
Calcium: 8.8 mg/dL — ABNORMAL LOW (ref 8.9–10.3)
Chloride: 97 mmol/L — ABNORMAL LOW (ref 98–111)
Creatinine, Ser: 9.71 mg/dL — ABNORMAL HIGH (ref 0.61–1.24)
GFR, Estimated: 6 mL/min — ABNORMAL LOW (ref >60)
Glucose, Bld: 85 mg/dL (ref 70–99)
Potassium: 4 mmol/L (ref 3.5–5.1)
Sodium: 136 mmol/L (ref 135–145)

## 2019-11-26 MED ORDER — METOPROLOL TARTRATE 50 MG PO TABS
75.0000 mg | ORAL_TABLET | Freq: Two times a day (BID) | ORAL | Status: DC
  Administered 2019-11-26 – 2019-11-27 (×2): 75 mg via ORAL
  Filled 2019-11-26 (×2): qty 1

## 2019-11-26 NOTE — Progress Notes (Signed)
[facility] Progress Note   Subjective:  Feels well this AM.  Notes feeling much better than presentation.     Objective Vitals:   11/25/19 1327 11/25/19 2000 11/26/19 0600 11/26/19 0849  BP: 125/86 135/84 129/85 131/85  Pulse: 83 86 84 80  Resp: 18 16 18    Temp: 97.9 F (36.6 C) 98.9 F (37.2 C) 98.8 F (37.1 C)   TempSrc: Oral Oral Oral   SpO2: 100% 100% 100%   Weight:      Height:       Physical Exam General: sitting at edge of bed Heart: RRR, no rub Lungs: clear Abdomen: soft, nontender Extremities: no edema, RUE incision c/d/i, intact distal pulse, +t/b present in fistula vein Dialysis Access:  RIJ TDC c/d/i Neuro:  Conversant, nonfocal  Additional Objective Labs: Basic Metabolic Panel: Recent Labs  Lab 11/20/19 0428 11/21/19 0018 11/22/19 0220 11/22/19 0220 11/23/19 0325 11/23/19 0325 11/24/19 0310 11/25/19 0703 11/26/19 0305  NA 134*   < > 136   < > 137   < > 137 137 136  K 3.7   < > 3.4*   < > 4.1   < > 3.7 3.9 4.0  CL 92*   < > 97*   < > 99   < > 97* 98 97*  CO2 13*   < > 18*   < > 22   < > 23 26 24   GLUCOSE 109*   < > 89   < > 84   < > 92 88 85  BUN 266*   < > 118*   < > 57*   < > 75* 34* 41*  CREATININE 35.25*   < > 17.79*   < > 10.56*   < > 12.67* 7.81* 9.71*  CALCIUM 8.7*   < > 8.9  8.5*   < > 8.7*   < > 9.1 8.7* 8.8*  PHOS >30.0*  --  8.5*  --  5.9*  --   --   --   --    < > = values in this interval not displayed.   Liver Function Tests: Recent Labs  Lab 11/21/19 0018 11/22/19 0220 11/23/19 0325  AST 9* 15 21  ALT 12 14 22   ALKPHOS 146* 167* 129*  BILITOT 2.4* 2.7* 1.9*  PROT 6.3* 7.2 6.1*  ALBUMIN 3.4* 3.7 3.2*   No results for input(s): LIPASE, AMYLASE in the last 168 hours. CBC: Recent Labs  Lab 11/22/19 0220 11/22/19 0220 11/23/19 0325 11/23/19 0325 11/24/19 0310 11/25/19 0703 11/26/19 0305  WBC 10.1   < > 8.2   < > 8.5 9.0 8.6  HGB 8.2*   < > 7.3*   < > 8.2* 8.9* 7.8*  HCT 25.1*   < > 23.1*   < >  26.2* 29.1* 25.2*  MCV 91.6  --  96.7  --  97.4 99.7 99.2  PLT 68*   < > 63*   < > 82* 126* 168   < > = values in this interval not displayed.   Blood Culture    Component Value Date/Time   SDES BLOOD RIGHT HAND 11/20/2019 0008   SPECREQUEST  11/20/2019 0008    BOTTLES DRAWN AEROBIC AND ANAEROBIC Blood Culture adequate volume   CULT  11/20/2019 0008    NO GROWTH 5 DAYS Performed at [facility], [place] [address] [place], [place] [address]    REPTSTATUS 11/25/2019 FINAL 11/20/2019 0008    Cardiac Enzymes: No results  for input(s): CKTOTAL, CKMB, CKMBINDEX, TROPONINI in the last 168 hours. CBG: No results for input(s): GLUCAP in the last 168 hours. Iron Studies:  No results for input(s): IRON, TIBC, TRANSFERRIN, FERRITIN in the last 72 hours. @lablastinr3 @ Studies/Results: No results found. Medications:  . sodium chloride   Intravenous Once  . amLODipine  10 mg Oral Daily  . Chlorhexidine Gluconate Cloth  6 each Topical Q0600  . darbepoetin (ARANESP) injection - DIALYSIS  60 mcg Intravenous Q Wed-HD  . heparin  5,000 Units Subcutaneous Q8H  . losartan  25 mg Oral Daily  . melatonin  5 mg Oral QHS  . metoprolol tartrate  75 mg Oral BID  . sevelamer carbonate  800 mg Oral TID WC    Assessment/Plan: 1.  ESRD: The available data points to end-stage renal disease.  s/p IR TDC today with HD #1 10/4, last was 10/8.  Plan next on 10/11.  CLIP underway. S/p R BC AVF 11/24/19 - appreciate VVS.  Discussed transplant process with him.   2.  Hypertensive urgency: resolved - now normotensive on po meds.  3.  Anemia: Likely anemia of chronic kidney disease perhaps with some hemolysis with HTN emergency.    1u pRBC 10/4 .Initiated ESA now that BP improved, ferritin > 1000, hold IV iron. 4.  Secondary hyperparathyroidism: PTH 208.  His calcium level is within acceptable range.  Phos 5.9 - renal diet and sevelamer. 5.  Elevated troponin levels: Cardiology following, not thought to be  ACS. 6.  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  ADAMTS13 low but not consistent with TTS.  7.  Metabolic acidosis, Anion Gap:  Likely secondary to CKD, improving with HD, d/c po bicarb. 8.  A flutter:  BB per cardiology.  TTE with mod pericardial effusion without tamponade thought to be uremic in etiology --> plan to recheck in 2-3 weeks.  Holding [facility] for now with anemia, they will follow.  Jannifer Hick MD 11/26/2019, 9:17 AM  [facility] Pager: [phone]

## 2019-11-26 NOTE — Plan of Care (Signed)
  Problem: Health Behavior/Discharge Planning: Goal: Ability to manage health-related needs will improve Outcome: Progressing   Problem: Clinical Measurements: Goal: Ability to maintain clinical measurements within normal limits will improve Outcome: Progressing   Problem: Activity: Goal: Risk for activity intolerance will decrease Outcome: Progressing   Problem: Nutrition: Goal: Adequate nutrition will be maintained Outcome: Progressing   

## 2019-11-26 NOTE — Progress Notes (Addendum)
Family Medicine Teaching Service Daily Progress Note Intern Pager: [phone]  Patient name: Samuel Burns Medical record number: [id number] Date of birth: Apr 19, 1985 Age: 34 y.o. Gender: male  Primary Care Provider: Patient, No Pcp Per Consultants: Nephrology, IR, cardiology, general surgery, CCM, heme/onc Code Status: Full  Pt Overview and Major Events to Date:  Admitted on 10/3 Placement of Tunneled HD Cath and starting of HD on 10/4 VF Placed on 10/8  Assessment and Plan: Samuel Burns is a 34 yr old male presenting with nausea and vomiting found to have hypertensive crisis with acute renal failure. PMHx is significant forHTN.  Acute renal failure   Outpatient Dialysis: Most likely etiology malignant hypertension.unneled Cath placed 10/3. Dialysis on 10/4, 10/5, 10/6, 10/8. AVF access placed 20/8 without complication. Working with Dialysis Coordinator to set up outpatient dialysis. Awaiting financial clearance. -Nephrology consulted, appreciate recommendations -Follow up with Dialysis Coordinator on Monday 10/11 for financial clearance  Hypertension   Hypertensive emergency, resolved: BP 248/137 on admission, most recent BP 129/85 on oral antihypertensives.  -Continue Amlodipine 10 mg daily -Continue Metoprolol tartrate 75 mg twice daily, can increase to 100 mg BID if needed per cardiology -Continue Losartan 25 mg daily  Type 2 NSTEMI (resolved): Has been without chest pain or SOB, no ST elevations on EKG. No further workup per cardiology. -Perform EKG if patient symptomatic or there is a change in clinical status  HFpEF Newly diagnosed, likely chronic. Not in exacerbation.BNP 1,267 on admission.Secondary to HTN. TTE with EF 50-55%, G2DD, severe concentric LVH. -Continue HTN treatment as above  Normocytic anemia, stable: Likely anemia of chronic inflammation and MAHA secondary to malignant hypertension. TTP and DIC ruled out. Heme/onc following. Hgb 8.9>7.8  10/10. -Transfusion threshold 7 -Continue to monitor CBC -Continue to restrict fluid intake as to not dilute his hemoglobin  Thrombocytopenia, resolved Likely secondary to uremia and MAHA. No active bleeding currently.  Platelets currently 63>82>126>168 (10/10). -Consider ddAVP if bleeding again -Transfusion for platelets <20k or active bleeding  A Flutter, intermittent, stable Has had two episodes. Potentially triggered by pericardial effusion.Cardiology consulted, appreciate involvement.Has remained in NSR.CHADS2VASC is 1. Given thrombocytopenia, no anticoagulation. -Continue Metoprolol tartrate 75 mg BID -f/u cardiology recommendations  Covid vaccination Unvaccinated, interested in receiving. - will coordinate vaccination  Pericardial effusion: Moderate pericardial effusion seen on TTE without signs of cardiac tamponade. Likely secondary to uremia. Will continue to monitor. -Repeat TTE in 2-3 weeks (between 10/19 and 10/26)  FEN/GI: Renal Diet with fluid restriction PPx: Subcu heparin TID  Disposition: Cardiac tele; anticipate DC home once patient has CLIP placement  Subjective:  Pleasant patient, sitting upright on the edge of his bed.  Asked me for a cup of ice water.  Otherwise no concerns or complaints this morning, no questions.   Objective: Temp:  [97.9 F (36.6 C)-98.9 F (37.2 C)] 98.9 F (37.2 C) (10/09 2000) Pulse Rate:  [81-86] 86 (10/09 2000) Resp:  [16-18] 16 (10/09 2000) BP: (125-140)/(82-92) 135/84 (10/09 2000) SpO2:  [100 %] 100 % (10/09 2000) Weight:  [82.4 kg] 82.4 kg (10/09 0500)  Physical exam: General: Well-appearing, somewhat sleepy Respiratory: CTA bilaterally, comfortable work of breathing Cardio: RRR, S1-S2 present, no murmurs appreciated Abdomen: Normal bowel sounds appreciated Extremities: No edema, cyanosis, or deformity appreciated   Laboratory: Recent Labs  Lab 11/23/19 0325 11/24/19 0310 11/25/19 0703  WBC 8.2 8.5 9.0   HGB 7.3* 8.2* 8.9*  HCT 23.1* 26.2* 29.1*  PLT 63* 82* 126*   Recent Labs  Lab 11/21/19 0018  11/21/19 0018 11/22/19 0220 11/22/19 0220 11/23/19 0325 11/24/19 0310 11/25/19 0703  NA 137   < > 136   < > 137 137 137  K 3.3*   < > 3.4*   < > 4.1 3.7 3.9  CL 95*   < > 97*   < > 99 97* 98  CO2 16*   < > 18*   < > 22 23 26   BUN 182*   < > 118*   < > 57* 75* 34*  CREATININE 23.62*   < > 17.79*   < > 10.56* 12.67* 7.81*  CALCIUM 8.6*   < > 8.9   8.5*   < > 8.7* 9.1 8.7*  PROT 6.3*  --  7.2  --  6.1*  --   --   BILITOT 2.4*  --  2.7*  --  1.9*  --   --   ALKPHOS 146*  --  167*  --  129*  --   --   ALT 12  --  14  --  22  --   --   AST 9*  --  15  --  21  --   --   GLUCOSE 102*   < > 89   < > 84 92 88   < > = values in this interval not displayed.    Results for orders placed or performed during the hospital encounter of 11/19/19 (from the past 24 hour(s))  CBC     Status: Abnormal   Collection Time: 11/25/19  7:03 AM  Result Value Ref Range   WBC 9.0 4.0 - 10.5 K/uL   RBC 2.92 (L) 4.22 - 5.81 MIL/uL   Hemoglobin 8.9 (L) 13.0 - 17.0 g/dL   HCT 29.1 (L) 39 - 52 %   MCV 99.7 80.0 - 100.0 fL   MCH 30.5 26.0 - 34.0 pg   MCHC 30.6 30.0 - 36.0 g/dL   RDW 17.3 (H) 11.5 - 15.5 %   Platelets 126 (L) 150 - 400 K/uL   nRBC 0.0 0.0 - 0.2 %  Basic metabolic panel     Status: Abnormal   Collection Time: 11/25/19  7:03 AM  Result Value Ref Range   Sodium 137 135 - 145 mmol/L   Potassium 3.9 3.5 - 5.1 mmol/L   Chloride 98 98 - 111 mmol/L   CO2 26 22 - 32 mmol/L   Glucose, Bld 88 70 - 99 mg/dL   BUN 34 (H) 6 - 20 mg/dL   Creatinine, Ser 7.81 (H) 0.61 - 1.24 mg/dL   Calcium 8.7 (L) 8.9 - 10.3 mg/dL   GFR, Estimated 8 (L) >60 mL/min   Anion gap 13 5 - 15   No new imaging  Samuel Floro, DO 11/26/2019, 2:25 AM PGY-3, [place] Intern pager: [phone], text pages welcome

## 2019-11-26 NOTE — Plan of Care (Signed)

## 2019-11-27 ENCOUNTER — Inpatient Hospital Stay: Payer: Self-pay

## 2019-11-27 ENCOUNTER — Other Ambulatory Visit (HOSPITAL_COMMUNITY): Payer: Self-pay | Admitting: Family Medicine

## 2019-11-27 DIAGNOSIS — Z23 Encounter for immunization: Secondary | ICD-10-CM

## 2019-11-27 LAB — RENAL FUNCTION PANEL
Albumin: 3.2 g/dL — ABNORMAL LOW (ref 3.5–5.0)
Anion gap: 15 (ref 5–15)
BUN: 50 mg/dL — ABNORMAL HIGH (ref 6–20)
CO2: 23 mmol/L (ref 22–32)
Calcium: 8.9 mg/dL (ref 8.9–10.3)
Chloride: 96 mmol/L — ABNORMAL LOW (ref 98–111)
Creatinine, Ser: 11.88 mg/dL — ABNORMAL HIGH (ref 0.61–1.24)
GFR, Estimated: 5 mL/min — ABNORMAL LOW (ref >60)
Glucose, Bld: 88 mg/dL (ref 70–99)
Phosphorus: 7.8 mg/dL — ABNORMAL HIGH (ref 2.5–4.6)
Potassium: 3.9 mmol/L (ref 3.5–5.1)
Sodium: 134 mmol/L — ABNORMAL LOW (ref 135–145)

## 2019-11-27 LAB — CBC
HCT: 24.7 % — ABNORMAL LOW (ref 39.0–52.0)
Hemoglobin: 7.6 g/dL — ABNORMAL LOW (ref 13.0–17.0)
MCH: 30.2 pg (ref 26.0–34.0)
MCHC: 30.8 g/dL (ref 30.0–36.0)
MCV: 98 fL (ref 80.0–100.0)
Platelets: 182 10*3/uL (ref 150–400)
RBC: 2.52 MIL/uL — ABNORMAL LOW (ref 4.22–5.81)
RDW: 16.5 % — ABNORMAL HIGH (ref 11.5–15.5)
WBC: 8.1 10*3/uL (ref 4.0–10.5)
nRBC: 0 % (ref 0.0–0.2)

## 2019-11-27 MED ORDER — AMLODIPINE BESYLATE 10 MG PO TABS
10.0000 mg | ORAL_TABLET | Freq: Every day | ORAL | 0 refills | Status: DC
Start: 2019-11-27 — End: 2019-12-15

## 2019-11-27 MED ORDER — LOSARTAN POTASSIUM 25 MG PO TABS
25.0000 mg | ORAL_TABLET | Freq: Every day | ORAL | 0 refills | Status: DC
Start: 2019-11-28 — End: 2019-11-27

## 2019-11-27 MED ORDER — METOPROLOL TARTRATE 75 MG PO TABS: 75.0000 mg | ORAL_TABLET | Freq: Two times a day (BID) | ORAL | 0 refills | Status: DC

## 2019-11-27 MED ORDER — HEPARIN SODIUM (PORCINE) 1000 UNIT/ML IJ SOLN
INTRAMUSCULAR | Status: AC
  Filled 2019-11-27: qty 4

## 2019-11-27 MED ORDER — AMLODIPINE BESYLATE 10 MG PO TABS
10.0000 mg | ORAL_TABLET | Freq: Every day | ORAL | 0 refills | Status: DC
Start: 2019-11-27 — End: 2019-11-27

## 2019-11-27 MED ORDER — SEVELAMER CARBONATE 800 MG PO TABS
800.0000 mg | ORAL_TABLET | Freq: Three times a day (TID) | ORAL | 0 refills | Status: DC
Start: 2019-11-27 — End: 2019-11-27

## 2019-11-27 MED ORDER — SEVELAMER CARBONATE 800 MG PO TABS
800.0000 mg | ORAL_TABLET | Freq: Three times a day (TID) | ORAL | 0 refills | Status: DC
Start: 2019-11-27 — End: 2019-12-15

## 2019-11-27 MED ORDER — LOSARTAN POTASSIUM 25 MG PO TABS
25.0000 mg | ORAL_TABLET | Freq: Every day | ORAL | 0 refills | Status: DC
Start: 2019-11-28 — End: 2019-12-15

## 2019-11-27 MED FILL — LOSARTAN POTASSIUM 25 MG TA: 25 | 30 days supply | Qty: 30 | Fill #0

## 2019-11-27 MED FILL — METOPROLOL TARTRATE 25 MG T: 25 | 30 days supply | Qty: 180 | Fill #0

## 2019-11-27 MED FILL — SEVELAMER CARBONATE 800MG: 800 | 30 days supply | Qty: 90 | Fill #0

## 2019-11-27 MED FILL — AMLODIPINE BESYLATE 10 MG T: 10 | 30 days supply | Qty: 30 | Fill #0

## 2019-11-27 NOTE — Plan of Care (Signed)

## 2019-11-27 NOTE — Progress Notes (Signed)
D/C instructions given and reviewed. No questions asked but encouraged to call with any questions. Tele and IV removed, tolerated well. 

## 2019-11-27 NOTE — Progress Notes (Signed)
Patient has been financially cleared and accepted at [facility] outpatient HD clinic with a seat time of 6:10am MWF. He needs to arrive to his appointments 20 minutes early. If possible, he should report to his clinic the day prior to his first appointment, since he has an early seat time.  Navigator spoke with patient to provide seat schedule. Schedule information also put in writing in patient's AVS. Patient gave permission to call his spouse/Asia, but she did not answer. Navigator left message requesting a return call. Navigator informed Attending and Nephrologist that patient is cleared for discharge from outpatient HD standpoint. Renal Navigator received call back from patient's spouse and updated her on plan for outpatient HD clinic start and transportation with [facility] [place]. She was extremely appreciative. She will transport him on his first day. She states she feels he is not grasping what is being told to him and was very thankful for the information. She states she will be helping him.  Alphonzo Cruise, [place] Renal Navigator [phone]

## 2019-11-27 NOTE — Discharge Instructions (Signed)
Dear Samuel Burns,   Thank you so much for allowing [place] to be part of your care!  You were admitted to [facility] for kidney failure due to high blood pressure.  During your hospitalization, you were started on dialysis and you were given medications to control your blood pressure.  You required a transfusion of blood due to your low blood counts.  Please see below for further instructions from your surgeon as well as a list of therapy resources.  POST-HOSPITAL & CARE INSTRUCTIONS 1. Your next dialysis session is this Wednesday, 10/13 2. Please take your medications as instructed. 3. Please let PCP/Specialists know of any changes that were made.  4. Please see medications section of this packet for any medication changes.   DOCTOR'S APPOINTMENT & FOLLOW UP CARE INSTRUCTIONS  Future Appointments  Date Time Provider [place]  12/15/2019  2:30 PM Ladell Pier, MD [facility] None    RETURN PRECAUTIONS: Please return if you develop shortness of breath or problems with your dialysis site.  Take care and be well!  [facility]  [place], [place] [address] [phone]        [facility]  Discharge Instructions  AV Fistula or Graft Surgery for Dialysis Access  Please refer to the following instructions for your post-procedure care. Your surgeon or physician assistant will discuss any changes with you.  Activity  You may drive the day following your surgery, if you are comfortable and no longer taking prescription pain medication. Resume full activity as the soreness in your incision resolves.  Bathing/Showering  You may shower after you go home. Keep your incision dry for 48 hours. Do not soak in a bathtub, hot tub, or swim until the incision heals completely. You may not shower if you have a hemodialysis catheter.  Incision Care  Clean your  incision with mild soap and water after 48 hours. Pat the area dry with a clean towel. You do not need a bandage unless otherwise instructed. Do not apply any ointments or creams to your incision. You may have skin glue on your incision. Do not peel it off. It will come off on its own in about one week. Your arm may swell a bit after surgery. To reduce swelling use pillows to elevate your arm so it is above your heart. Your doctor will tell you if you need to lightly wrap your arm with an ACE bandage.  Diet  Resume your normal diet. There are not special food restrictions following this procedure. In order to heal from your surgery, it is CRITICAL to get adequate nutrition. Your body requires vitamins, minerals, and protein. Vegetables are the best source of vitamins and minerals. Vegetables also provide the perfect balance of protein. Processed food has little nutritional value, so try to avoid this.  Medications  Resume taking all of your medications. If your incision is causing pain, you may take over-the counter pain relievers such as acetaminophen (Tylenol). If you were prescribed a stronger pain medication, please be aware these medications can cause nausea and constipation. Prevent nausea by taking the medication with a snack or meal. Avoid constipation by drinking plenty of fluids and eating foods with high amount of fiber, such as fruits, vegetables, and grains.  Do not take Tylenol if you are taking prescription pain medications.  Follow up Your surgeon may want to see you in the office  following your access surgery. If so, this will be arranged at the time of your surgery.  Please call us immediately for any of the following conditions:  . Increased pain, redness, drainage (pus) from your incision site . Fever of 101 degrees or higher . Severe or worsening pain at your incision site . Hand pain or numbness. .  Reduce your risk of vascular disease:  . Stop smoking. If you would like  help, call [facility] at 1-800-QUIT-NOW 703-842-3424) or [place] at (906)042-7590  . Manage your cholesterol . Maintain a desired weight . Control your diabetes . Keep your blood pressure down  Dialysis  It will take several weeks to several months for your new dialysis access to be ready for use. Your surgeon will determine when it is okay to use it. Your nephrologist will continue to direct your dialysis. You can continue to use your Permcath until your new access is ready for use.   11/24/2019 [place] [facility] [id number] 1985/06/27  Surgeon(s): [name], MD  Procedure(s): Creation of right brachiocephalic AV fistula  x Do not stick fistula for 12 weeks    If you have any questions, please call the office at [phone].   Therapy and Counseling Resources Most providers on this list will take Medicaid. Patients with commercial insurance or Medicare should contact their insurance company to get a list of in network providers.  [facility]:Psychiatry and Counseling [address] [facility] [place]. [place], [place] [address] [place]  [address], [place], [place] [address]      [place] [address]  [place], [place] [address] [phone]  [place] [address], [place]  [place], [place] [address]       [phone]      [facility] [address], [place], [place]  Family Solutions:  [place]. [place] [phone]  Journeys Counseling:  [place] [address] [place] [phone]  [facility] (under & uninsured) [address] [place] [phone]    [email or web address]    [place] [address] . [place]    [phone]  [facility] [place]     Phone:  [phone]      [place]  [place] [address]      [place], [place] ext [place]: [place], [place], [place]   [place] ([place] therapist) [email or web address]  [place] [address]   [place] [address]    [phone]    [facility]   [address] [place],  [place], [place]   [place]  [place]  [facility]  [address] [place], [place]        [phone]  Open Access/Walk In Clinic under & uninsured  [facility]  [address] [place], [place] [phone]  Family Service of the [place],  ([place])   [place], [place]: [phone]) 8:30 - 12; 1 - 2:30  Family Service of the [place],  [place], [place]    ([phone]):8:30 - 12; 2 - 3PM  [facility]  [address]  [address]  [place]; [phone]):   Mon - Fri 8 AM - 5 PM  [facility] [place]  MWF 12:30 to 3:00 or call to schedule an appointment  [phone]  Specific Provider options Psychology Today  [email or web address] 5. click on find a therapist  6. enter your zip code 7. left side and select or tailor a therapist for your specific need.   [facility] Provider Directory [email or web address]  (Medicaid)   Follow all drop down to find a provider  [place] [phone] or [email or web address] [address], [place], [place] Recovery support and educational   24- Hour Availability:  .  Marland [facility]  . [place], [place] Crisis [phone]  . Family Service of the [employer]  [phone]  [facility] Crisis Service  [phone]   . [place]  [phone] (after hours)  . Therapeutic Alternative/Mobile Crisis   [phone]  . [place] National Suicide Hotline  580-181-7979 ([place])  . Call 911 or go to emergency room  . [employer]  (725)824-0203);  [place] and [employer]   . [facility] ACCESS  [phone]); [place], [place], [place], [place], [place], [place], [place]

## 2019-11-27 NOTE — Progress Notes (Signed)
Family Medicine Teaching Service Daily Progress Note Intern Pager: [phone]  Patient name: Samuel Burns Medical record number: [id number] Date of birth: [birth year] Age: 34 y.o. Gender: male  Primary Care Provider: Patient, No Pcp Per Consultants: Nephrology, IR, cardiology, general surgery, CCM, heme/onc   Code Status: Full Code  Pt Overview and Major Events to Date:  10/3 Admitted 10/4 placement of tunneled HD catheter by IR, oozing at catheter site, 1u pRBC, FFP, HD 10/5 HD 10/6 HD 10/7 Upper extremity vein mapping 10/8 Right brachiocephalic AVF placement, right cephalic vein thrombectomy, HD 10/11 HD  Assessment and Plan: Samuel Burns is a 34 y.o. male presenting with nausea and vomiting found to have hypertensive crisis with acute renal failure. PMH is significant forHTN.  Acute renal failure Most likely etiology malignant hypertension.Appreciate nephrology involvement. AVF access placed this admission. CLIP in process. - plan for HD today per nephrology  Hypertensive emergency Initially presented with significant hypertension 248/137 on admission, now resolved and adequately controlled on oral medications. - amlodipine 10 mg - metoprolol tartrate 75 mg BID - losartan 25 mg daily  Type 2 NSTEMI Resolved. Has been without chest pain or SOB, no ST elevations on EKG. No further workup per cardiology. - EKG if symptomatic or change in clinical status  HFpEF Newly diagnosed, likely chronic. Not in exacerbation. BNP 1,267 on admission.Secondary to HTN. TTE with EF 50-55%, G2DD, severe concentric LVH. - HTN treatment as above  Pericardial effusion Moderate pericardial effusion seen on TTE without signs of cardiac tamponade. Likely secondary to uremia. Will continue to monitor. - will need repeat TTE in 2-3 weeks  Normocytic anemia Likely anemia of chronic inflammation and MAHA secondary to malignant hypertension. TTP and DIC ruled out. Heme/onc following. Hgb  stable. - transfusion threshold 7  Thrombocytopenia  Resolved. Likely secondary to uremia and MAHA. No active bleeding currently.  A Flutter Has had two episodes. Potentially triggered by pericardial effusion.Cardiology consulted, appreciate involvement. Has remained in NSR. CHADS2VASC is 1. - metoprolol tartrate as above - f/u cardiology recommendations  Covid vaccination Unvaccinated, interested in receiving. - will coordinate vaccination today  FEN/GI: renal diet PPx: [place]  Disposition: cardiac-tele, dc home today or tomorrow pending CLIP  Subjective:  Seen this morning in HD.  Patient feels good overall, improved since admission.  No concerns expressed at this time.  Agreeable to discharge today pending CLIP. Agreeable to have covid vaccine today.  Objective: Temp:  [97.7 F (36.5 C)-98.5 F (36.9 C)] 97.7 F (36.5 C) (10/11 0700) Pulse Rate:  [85-87] 85 (10/11 0402) Resp:  [16-23] 16 (10/11 0930) BP: (125-153)/(77-107) 149/98 (10/11 0930) SpO2:  [98 %-100 %] 98 % (10/11 0700) Weight:  [80 kg-80.2 kg] 80.2 kg (10/11 0700) Physical Exam: General: Alert, laying in HD bed watching TV, NAD Cardiovascular: RRR, no murmur Respiratory: CTAB, no wheezes or rales, no respiratory distress Abdomen: soft, non-tender, +BS Extremities: WWP, no edema Derm: right chest catheter dressing CDI  Laboratory: Recent Labs  Lab 11/25/19 0703 11/26/19 0305 11/27/19 0500  WBC 9.0 8.6 8.1  HGB 8.9* 7.8* 7.6*  HCT 29.1* 25.2* 24.7*  PLT 126* 168 182   Recent Labs  Lab 11/21/19 0018 11/21/19 0018 11/22/19 0220 11/22/19 0220 11/23/19 0325 11/24/19 0310 11/25/19 0703 11/26/19 0305 11/27/19 0500  NA 137   < > 136   < > 137   < > 137 136 134*  K 3.3*   < > 3.4*   < > 4.1   < > 3.9 4.0 3.9  CL 95*   < > 97*   < > 99   < > 98 97* 96*  CO2 16*   < > 18*   < > 22   < > 26 24 23   BUN 182*   < > 118*   < > 57*   < > 34* 41* 50*  CREATININE 23.62*   < > 17.79*   < > 10.56*   < >  7.81* 9.71* 11.88*  CALCIUM 8.6*   < > 8.9  8.5*   < > 8.7*   < > 8.7* 8.8* 8.9  PROT 6.3*  --  7.2  --  6.1*  --   --   --   --   BILITOT 2.4*  --  2.7*  --  1.9*  --   --   --   --   ALKPHOS 146*  --  167*  --  129*  --   --   --   --   ALT 12  --  14  --  22  --   --   --   --   AST 9*  --  15  --  21  --   --   --   --   GLUCOSE 102*   < > 89   < > 84   < > 88 85 88   < > = values in this interval not displayed.     Imaging/Diagnostic Tests: No new imaging.  Zola Button, MD 11/27/2019, 9:47 AM PGY-1, [place] Intern pager: [phone], text pages welcome

## 2019-11-27 NOTE — TOC Transition Note (Signed)
Transition of Care [facility]) - CM/SW Discharge Note   Patient Details  Name: Zalan Shidler MRN: [id number] Date of Birth: 17-Mar-1985  Transition of Care [facility]) CM/SW Contact:  Zenon Mayo, RN Phone Number: 11/27/2019, 3:48 PM   Clinical Narrative:    Patient is for dc today, he has 3 n 1 and rolling walker in the room.  His girlfriend will pick him up at 5:30 or 5:45 today.  He has been ambulating and per Staff RN he does not need HHPT. MD will send meds to [facility], NCM assisted with [facility].  Final next level of care: Home/Self Care Barriers to Discharge: No Barriers Identified   Patient Goals and CMS Choice Patient states their goals for this hospitalization and ongoing recovery are:: get back to being independent, get back to work [employer].gov Compare Post Acute Care list provided to:: Patient Choice offered to / list presented to : NA  Discharge Placement                       Discharge Plan and Services   Discharge Planning Services: CM Consult Post Acute Care Choice: Home Health          DME Arranged: 3-N-1, Walker rolling DME Agency: [employer] Date DME Agency Contacted: 11/24/19 Time DME Agency Contacted: 1000 Representative spoke with at DME Agency: [place]: NA [place] Agency: [facility] Date [place]: 11/24/19 Time [place]: [place] Representative spoke with at [place]: [place] ([place]) Interventions     Readmission Risk Interventions No flowsheet data found.

## 2019-11-27 NOTE — Progress Notes (Signed)
Renal Navigator is still awaiting financial clearance in order for patient to receive HD in the outpatient clinic and will continue to follow closely.   Alphonzo Cruise, [place] Renal Navigator [phone]

## 2019-11-27 NOTE — Discharge Summary (Addendum)
[facility] Discharge Summary  Patient name: Samuel Burns Medical record number: [id number] Date of birth: [birth year] Age: 34 y.o. Gender: male Date of Admission: 11/19/2019  Date of Discharge: 11/27/2019  Admitting Physician: Zola Button, MD  Primary Care Provider: Patient, No Pcp Per Consultants: Nephrology, IR, cardiology, general surgery, CCM, heme/onc  Indication for Hospitalization: Acute renal failure  Discharge Diagnoses/Problem List:  Principal Problem:   Acute renal failure (ARF) ([place]) Active Problems:   Demand ischemia ([place])   Hypertensive emergency   Metabolic acidosis   ESRD (end stage renal disease) on dialysis ([place])   Atrial flutter with rapid ventricular response (HCC)   Thrombotic microangiopathy   Anemia   Thrombocytopenia ([place])    Disposition: home  Discharge Condition: Stable  Discharge Exam:  BP (!) 140/94 (BP Location: Left Arm)   Pulse 94   Temp 98.6 F (37 C) (Oral)   Resp 16   Ht 5\' 7"  (1.702 m)   Wt 79.5 kg   SpO2 100%   BMI 27.45 kg/m  General:Alert, laying in HD bed watching TV, NAD Cardiovascular:RRR, no murmur Respiratory:CTAB, no wheezes or rales, no respiratory distress Abdomen:soft, non-tender, +BS Extremities:WWP, no edema Derm: right chest catheter dressing CDI   Brief Hospital Course:  Samuel Burns is a 34 y.o. male with a history of HTN who presented with hypertensive emergency with acute renal failure. Hospital course outlined by problem below:  End Stage Renal Disease, newly diagnosed Patient presented with 2 weeks of fatigue, nausea, and decreased urine output found to have significantly elevated creatinine of 34.2, BUN of 257, and anion gap metabolic acidosis on admission.  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  He underwent placement of tunneled HD catheter by IR and had his first HD session on 10/4. He had serial  dialysis sessions on the following days. He also had right brachiocephalic AVF placement on 10/8 by vascular surgery.  He had improvement in his symptoms and electrolytes following HD.  Hypertensive emergency Presented with BP 240/137 on admission.  Patient was initially started on nicardipine drip, then was transitioned to oral medications and achieved adequate control prior to discharge.  His blood pressure was controlled with amlodipine 10 mg daily, metoprolol tartrate 75 mg twice daily, and losartan 25 mg daily.  On day of discharge, his BP was mostly in the 211H to 417E systolic.  Type 2 NSTEMI Patient recently presented with elevated troponin in the 500s which trended up to 2,300.  Patient did not have any chest pain or shortness of breath throughout admission.  EKGs obtained did not reveal any ST elevations.  Cardiology was consulted, it was ultimately felt that elevations in troponin were due to demand ischemia in the setting of acute renal failure and hypertensive emergency.  No further work-up was done.  HFpEF Due to elevated BNP of 1267 on admission, TTE was obtained which revealed EF 50 to 55%, G2DD, and severe concentric LVH.  He denies any significant edema on exam and did not require any diuresis.  He was treated for hypertension as above.  Pericardial effusion Patient was noted to have moderate pericardial effusion on TTE without evidence of tamponade. Thought to be likely secondary to uremia.  Cardiology recommended follow-up TTE in 2 to 3 weeks.  A Flutter Patient had 2 episodes of a flutter throughout admission which resolved spontaneously.  Cardiology was consulted, and patient was treated with beta-blocker as above.  Remained in NSR throughout  the rest of admission.  Did not start on anticoagulation due to thrombocytopenia.  Normocytic anemia Patient presented with normocytic anemia with hemoglobin 7.3 on presentation.  Required 1u pRBC during admission for hemoglobin of 6.4.   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  Further work-up ruled out AIHA (negative Coombs). Hgb 7.6 on day of discharge.  Thrombocytopenia Presented with thrombocytopenia with platelets 58 on admission.  Ultimately felt to be due to MAHA and uremia.  Further work-up ruled out DIC (unremarkable coags), TTP (mildly low ADAMTS13 activity).  He did have bleeding at his catheter insertion site following the procedure and received FFP.  Wound was redressed and bleeding was controlled.  Platelets gradually improved, platelets 182 on day of discharge.  Thoracic back pain Patient presented with a 2-day history of right-sided thoracic back pain.  In the ED, patient had a CXR and CT chest/abdomen (unable to use contrast in the setting of renal failure) which ruled out aortic dissection and aortic aneurysm.  There was some concern for cholecystitis given CT findings of thickened gallbladder wall and ultrasound finding of biliary sludge and fluid around the gallbladder.  He did have some RUQ tenderness on exam as well.  Elevated ALP, but normal AST/ALT. General surgery was consulted and had recommended starting broad-spectrum antibiotics (he received piperacillin tazobactam from 10/3-10/5).  On further reevaluation by general surgery, his symptoms resolved and they did not feel he had cholecystitis.  Continued to remain free of back pain/abdominal pain for the rest of admission.  Covid vaccination Patient received his first dose of the [place] COVID-19 vaccine on 10/11.  Observed for 15 minutes without incident.    Issues for Follow Up:  1. Needs follow-up TTE (10/19-10/26 time frame) for pericardial effusion 2. Re-check CBC for anemia 3. Assess blood pressure and bp medication adherence.  4. Make sure pt has scheduled follow up  appts for nephrology, cardiology.   5. Adjustment disorder/stress surrounding medical condition, therapy resources provided. Encourage pt to speak with a therapist.   6. First dose of Pfizer covid vaccine given 10/11  Significant Procedures:  10/4 placement of tunneled HD catheter by IR 10/8 Right brachiocephalic AVF placement, right cephalic vein thrombectomy   Significant Labs and Imaging:  Recent Labs  Lab 11/25/19 0703 11/26/19 0305 11/27/19 0500  WBC 9.0 8.6 8.1  HGB 8.9* 7.8* 7.6*  HCT 29.1* 25.2* 24.7*  PLT 126* 168 182   Recent Labs  Lab 11/21/19 0018 11/21/19 0018 11/22/19 0220 11/22/19 0220 11/23/19 0325 11/23/19 0325 11/24/19 0310 11/24/19 0310 11/25/19 0703 11/25/19 0703 11/26/19 0305 11/27/19 0500  NA 137   < > 136   < > 137  --  137  --  137  --  136 134*  K 3.3*   < > 3.4*   < > 4.1   < > 3.7   < > 3.9   < > 4.0 3.9  CL 95*   < > 97*   < > 99  --  97*  --  98  --  97* 96*  CO2 16*   < > 18*   < > 22  --  23  --  26  --  24 23  GLUCOSE 102*   < > 89   < > 84  --  92  --  88  --  85 88  BUN 182*   < > 118*   < > 57*  --  75*  --  34*  --  41* 50*  CREATININE 23.62*   < > 17.79*   < > 10.56*  --  12.67*  --  7.81*  --  9.71* 11.88*  CALCIUM 8.6*   < > 8.9  8.5*   < > 8.7*  --  9.1  --  8.7*  --  8.8* 8.9  MG  --   --   --   --  2.2  --   --   --   --   --   --   --   PHOS  --   --  8.5*  --  5.9*  --   --   --   --   --   --  7.8*  ALKPHOS 146*  --  167*  --  129*  --   --   --   --   --   --   --   AST 9*  --  15  --  21  --   --   --   --   --   --   --   ALT 12  --  14  --  22  --   --   --   --   --   --   --   ALBUMIN 3.4*  --  3.7  --  3.2*  --   --   --   --   --   --  3.2*   < > = values in this interval not displayed.      Results/Tests Pending at Time of Discharge: none  Discharge Medications:  Allergies as of 11/27/2019   No Known Allergies     Medication List    STOP taking these medications   atorvastatin 10 MG tablet Commonly  known as: LIPITOR   cetirizine-pseudoephedrine 5-120 MG tablet Commonly known as: ZYRTEC-D     TAKE these medications   acetaminophen 500 MG tablet Commonly known as: TYLENOL Take 500-1,000 mg by mouth every 6 (six) hours as needed (for pain).   amLODipine 10 MG tablet Commonly known as: NORVASC Take 1 tablet (10 mg total) by mouth daily.   losartan 25 MG tablet Commonly known as: COZAAR Take 1 tablet (25 mg total) by mouth daily. Start taking on: November 28, 2019 What changed:   medication strength  how much to take   Metoprolol Tartrate 75 MG Tabs Take 75 mg by mouth 2 (two) times daily.   sevelamer carbonate 800 MG tablet Commonly known as: RENVELA Take 1 tablet (800 mg total) by mouth 3 (three) times daily with meals.            Durable Medical Equipment  (From admission, onward)         Start     Ordered   11/24/19 1508  For home use only DME Walker rolling  Once       Question Answer Comment  Walker: With [place] Wheels   Patient needs a walker to treat with the following condition Weakness      11/24/19 1507   11/24/19 1508  For home use only DME 3 n 1  Once        11/24/19 1508           Discharge Care Instructions  (From admission, onward)  Start     Ordered   11/27/19 0000  Leave dressing on - Keep it clean, dry, and intact until clinic visit        11/27/19 1452          Discharge Instructions: Please refer to Patient Instructions section of EMR for full details.  Patient was counseled important signs and symptoms that should prompt return to medical care, changes in medications, dietary instructions, activity restrictions, and follow up appointments.   Follow-Up Appointments:  Follow-up Information    [facility]. Go on 12/15/2019.   Why: Please attend your hospital discharge appt with Dr Wynetta Emery on Friday, 12/15/19 at 2:30pm.  Please meet with financial counselor at this visit as well.    Contact information: [place] [address] [phone]       [facility] In 6 weeks.   Specialty: Vascular Surgery Why: Office will call you to arrange your appt (sent) Contact information: [address] [place], [facility] Follow up on 11/29/2019.   Why: Outpatient Dialysis schedule is every Monday, Wednesday and Friday at 6:10am. Please arrive at 5:50am. Go to the clinic tomorrow, 10/12 between 8a-4p to sign paperwork. First treatment on Wednesday 10/13: arrive at 5:50am.  Contact information: [place] [address] [phone]        Flossie Buffy, LCSW .        [facility] [place] transportation Follow up.   Why: Call at least 24 hours in advance of travel needed. This should be available to you by your dialysis appt Friday, by calling Thursday. Contact information: [facility] Follow up.   Why: rolling walker, 3 n 1  Contact information: [place] [address] [phone]               Zola Button, MD 11/27/2019, 4:53 PM PGY-1, [facility]   Resident Addendum I have separately seen and examined the patient.  I have discussed the findings and exam with the resident and agree with the above note.  I helped develop the management plan that is described in the resident's note and I agree with the content.   Addison Naegeli, MD PGY-3 [facility] residency program

## 2019-11-27 NOTE — Procedures (Signed)
I was present at this dialysis session. I have reviewed the session itself and made appropriate changes.   Doing well on HD w/o complaint. BP ok.  No UF goal.  4K bath, BFR 400 TDC,  RUE AVF +T.    CLIP in process  Hb stable 7.6 on ESA qWed, Trend  Next HD 10/13 here or outpt pending CLIP status.  Filed Weights   11/25/19 0500 11/27/19 0402 11/27/19 0700  Weight: 82.4 kg 80 kg 80.2 kg    Recent Labs  Lab 11/27/19 0500  NA 134*  K 3.9  CL 96*  CO2 23  GLUCOSE 88  BUN 50*  CREATININE 11.88*  CALCIUM 8.9  PHOS 7.8*    Recent Labs  Lab 11/25/19 0703 11/26/19 0305 11/27/19 0500  WBC 9.0 8.6 8.1  HGB 8.9* 7.8* 7.6*  HCT 29.1* 25.2* 24.7*  MCV 99.7 99.2 98.0  PLT 126* 168 182    Scheduled Meds: . sodium chloride   Intravenous Once  . amLODipine  10 mg Oral Daily  . Chlorhexidine Gluconate Cloth  6 each Topical Q0600  . darbepoetin (ARANESP) injection - DIALYSIS  60 mcg Intravenous Q Wed-HD  . heparin  5,000 Units Subcutaneous Q8H  . heparin sodium (porcine)      . losartan  25 mg Oral Daily  . melatonin  5 mg Oral QHS  . metoprolol tartrate  75 mg Oral BID  . sevelamer carbonate  800 mg Oral TID WC   Continuous Infusions: PRN Meds:.acetaminophen **OR** acetaminophen, lip balm, oxyCODONE-acetaminophen, polyethylene glycol   Pearson Grippe  MD 11/27/2019, 9:32 AM

## 2019-11-27 NOTE — Progress Notes (Signed)
   Covid-19 Vaccination Clinic  Name:  Samuel Burns    MRN: [id number] DOB: 09/06/1985  11/27/2019  Mr. Care was observed post Covid-19 immunization for 15 minutes without incident. He was provided with Vaccine Information Sheet and instruction to access the V-Safe system.   [name] was instructed to call 911 with any severe reactions post vaccine: Marland Kitchen Difficulty breathing  . Swelling of face and throat  . A fast heartbeat  . A bad rash all over body  . Dizziness and weakness   Immunizations Administered    Name Date Dose VIS Date Route   Pfizer COVID-19 Vaccine 11/27/2019 12:17 PM 0.3 mL 04/12/2018 Intramuscular   Manufacturer: [place]   Lot: [id number]   NDC: [address]-4

## 2019-11-28 ENCOUNTER — Telehealth: Payer: Self-pay | Admitting: Nephrology

## 2019-11-28 NOTE — Telephone Encounter (Signed)
Transition of care contact from inpatient facility  Date of Discharge: 11/27/19 Date of Contact: 11/28/19 Method of contact: Phone  Attempted to contact patient to discuss transition of care from inpatient admission. Patient did not answer the phone. Message was left on the patient's voicemail with call back number [phone].

## 2019-12-15 ENCOUNTER — Other Ambulatory Visit: Payer: Self-pay

## 2019-12-15 ENCOUNTER — Emergency Department (HOSPITAL_BASED_OUTPATIENT_CLINIC_OR_DEPARTMENT_OTHER): Payer: Self-pay

## 2019-12-15 ENCOUNTER — Encounter (HOSPITAL_COMMUNITY): Payer: Self-pay | Admitting: Emergency Medicine

## 2019-12-15 ENCOUNTER — Emergency Department (HOSPITAL_COMMUNITY)
Admission: EM | Admit: 2019-12-15 | Discharge: 2019-12-15 | Disposition: A | Discharge status: Home / Self Care | Payer: Self-pay | Attending: Emergency Medicine | Admitting: Emergency Medicine

## 2019-12-15 ENCOUNTER — Encounter: Payer: Self-pay | Admitting: Internal Medicine

## 2019-12-15 ENCOUNTER — Ambulatory Visit: Payer: Self-pay | Attending: Internal Medicine | Admitting: Internal Medicine

## 2019-12-15 VITALS — BP 197/125 | HR 94 | Resp 16 | Ht 67.5 in | Wt 172.6 lb

## 2019-12-15 DIAGNOSIS — I129 Hypertensive chronic kidney disease with stage 1 through stage 4 chronic kidney disease, or unspecified chronic kidney disease: Secondary | ICD-10-CM

## 2019-12-15 DIAGNOSIS — I313 Pericardial effusion (noninflammatory): Secondary | ICD-10-CM

## 2019-12-15 DIAGNOSIS — I3139 Other pericardial effusion (noninflammatory): Secondary | ICD-10-CM | POA: Insufficient documentation

## 2019-12-15 DIAGNOSIS — J45909 Unspecified asthma, uncomplicated: Secondary | ICD-10-CM | POA: Insufficient documentation

## 2019-12-15 DIAGNOSIS — D638 Anemia in other chronic diseases classified elsewhere: Secondary | ICD-10-CM

## 2019-12-15 DIAGNOSIS — I120 Hypertensive chronic kidney disease with stage 5 chronic kidney disease or end stage renal disease: Secondary | ICD-10-CM | POA: Insufficient documentation

## 2019-12-15 DIAGNOSIS — Z79899 Other long term (current) drug therapy: Secondary | ICD-10-CM | POA: Insufficient documentation

## 2019-12-15 DIAGNOSIS — M79609 Pain in unspecified limb: Secondary | ICD-10-CM

## 2019-12-15 DIAGNOSIS — N186 End stage renal disease: Secondary | ICD-10-CM

## 2019-12-15 DIAGNOSIS — M79601 Pain in right arm: Secondary | ICD-10-CM | POA: Insufficient documentation

## 2019-12-15 DIAGNOSIS — Z992 Dependence on renal dialysis: Secondary | ICD-10-CM | POA: Insufficient documentation

## 2019-12-15 DIAGNOSIS — Z7689 Persons encountering health services in other specified circumstances: Secondary | ICD-10-CM

## 2019-12-15 DIAGNOSIS — M7989 Other specified soft tissue disorders: Secondary | ICD-10-CM

## 2019-12-15 HISTORY — DX: Unspecified kidney failure: N19

## 2019-12-15 MED ORDER — AMLODIPINE BESYLATE 10 MG PO TABS: 10.0000 mg | ORAL_TABLET | Freq: Every day | ORAL | 6 refills | Status: DC

## 2019-12-15 MED ORDER — SEVELAMER CARBONATE 800 MG PO TABS: 800.0000 mg | ORAL_TABLET | Freq: Three times a day (TID) | ORAL | 3 refills | Status: DC

## 2019-12-15 MED ORDER — LOSARTAN POTASSIUM 25 MG PO TABS: 25.0000 mg | ORAL_TABLET | Freq: Every day | ORAL | 6 refills | Status: DC

## 2019-12-15 MED ORDER — METOPROLOL TARTRATE 75 MG PO TABS: 75.0000 mg | ORAL_TABLET | Freq: Two times a day (BID) | ORAL | 4 refills | Status: DC

## 2019-12-15 MED ORDER — CLONIDINE HCL 0.1 MG PO TABS: 0.1000 mg | ORAL_TABLET | Freq: Two times a day (BID) | ORAL | 3 refills | Status: DC

## 2019-12-15 MED FILL — cloNIDine HCL 0.1 MG TABS: 0.1 | 30 days supply | Qty: 60 | Fill #0

## 2019-12-15 NOTE — Patient Instructions (Addendum)
We have added a new blood pressure medication called Clonidine for you to take twice a day.  Please go to the emergency room to have your right arm checked.  We are concern that your dialysis fistula may be clotted.  I have spoken with the nephrologist on call and she recommended the same.

## 2019-12-15 NOTE — Discharge Instructions (Addendum)
Fistula looked good on ultrasound, no clot.  There is a small cyst in your antecubital area which can cause some pain. May wish to try and elevate arm at home if swelling continues. I would not apply ice. Follow-up with your nephrologist. Return here for any new/acute changes-- high fever, arm turning red, etc.

## 2019-12-15 NOTE — ED Provider Notes (Signed)
[facility] EMERGENCY DEPARTMENT Provider Note   CSN: [id number] Arrival date & time: 12/15/19  1653     History Chief Complaint  Patient presents with  . Arm Pain  . Arm Swelling    Samuel Burns is a 34 y.o. male.  The history is provided by the patient and medical records.    34 y.o. M with hx of ESRD on HD with recent placement of dialysis fistula 11/17/19, HTN, thrombocytopenia, presenting to the ED with right arm pain.  States he has been getting his scheduled dialysis every MWF via port a cath including today.  He went and saw PCP who discussed with nephrology and sent him here to ensure no clot in his fistula.  He denies any fevers, redness, numbness, or tingling to right arm.  He is right hand dominant.  Past Medical History:  Diagnosis Date  . Asthma   . Renal failure     Patient Active Problem List   Diagnosis Date Noted  . Pericardial effusion 12/15/2019  . Hypertensive renal disease 12/15/2019  . Thrombotic microangiopathy   . Anemia of chronic disease   . Thrombocytopenia ([place])   . Atrial flutter with rapid ventricular response ([place]) 11/21/2019  . ESRD (end stage renal disease) on dialysis ([place])   . Acute renal failure (ARF) ([place]) 11/19/2019  . Demand ischemia ([place])   . Hypertensive emergency   . Metabolic acidosis     Past Surgical History:  Procedure Laterality Date  . AV FISTULA PLACEMENT Right 11/24/2019   Procedure: Creation of RIGHT ARM Brachial/Cephalic ARTERIOVENOUS (AV) FISTULA.;  Surgeon: [name], MD;  Location: [facility];  Service: Vascular;  Laterality: Right;  . IR FLUORO GUIDE CV LINE RIGHT  11/20/2019  . IR US GUIDE VASC ACCESS RIGHT  11/20/2019       Family History  Problem Relation Age of Onset  . Hypertension Maternal Grandmother     Social History   Tobacco Use  . Smoking status: Never Smoker  . Smokeless tobacco: Never Used  Substance Use Topics  . Alcohol use: No  . Drug use: Yes    Types:  Marijuana    Home Medications Prior to Admission medications   Medication Sig Start Date End Date Taking? Authorizing Provider  acetaminophen (TYLENOL) 500 MG tablet Take 500-1,000 mg by mouth every 6 (six) hours as needed (for pain).    [provider]  amLODipine (NORVASC) 10 MG tablet Take 1 tablet (10 mg total) by mouth daily. 12/15/19   Ladell Pier, MD  cloNIDine (CATAPRES) 0.1 MG tablet Take 1 tablet (0.1 mg total) by mouth 2 (two) times daily. 12/15/19   Ladell Pier, MD  losartan (COZAAR) 25 MG tablet Take 1 tablet (25 mg total) by mouth daily. 12/15/19   Ladell Pier, MD  Metoprolol Tartrate 75 MG TABS Take 75 mg by mouth 2 (two) times daily. 12/15/19   Ladell Pier, MD  sevelamer carbonate (RENVELA) 800 MG tablet Take 1 tablet (800 mg total) by mouth 3 (three) times daily with meals. 12/15/19   Ladell Pier, MD    Allergies    Patient has no known allergies.  Review of Systems   Review of Systems  Musculoskeletal: Positive for arthralgias.  All other systems reviewed and are negative.   Physical Exam Updated Vital Signs BP (!) 188/107 (BP Location: Left Arm)   Pulse 91   Temp 98.4 F (36.9 C) (Oral)   Resp 16  Ht 5' 7.5" (1.715 m)   Wt 78 kg   SpO2 98%   BMI 26.54 kg/m   Physical Exam Vitals and nursing note reviewed.  Constitutional:      Appearance: He is well-developed.  HENT:     Head: Normocephalic and atraumatic.  Eyes:     Conjunctiva/sclera: Conjunctivae normal.     Pupils: Pupils are equal, round, and reactive to light.  Cardiovascular:     Rate and Rhythm: Normal rate and regular rhythm.     Heart sounds: Normal heart sounds.  Pulmonary:     Effort: Pulmonary effort is normal.     Breath sounds: Normal breath sounds.  Abdominal:     General: Bowel sounds are normal.     Palpations: Abdomen is soft.  Musculoskeletal:        General: Normal range of motion.     Cervical back: Normal range of motion.      Comments: Fistula RUE with strong thrill present, incisions appear clean, no redness or cellulitic changes to the arm, no tissue crepitus, points to [facility] fossa as area of pain/swelling, minimal swelling here and mildly into distal bicep area  Skin:    General: Skin is warm and dry.  Neurological:     Mental Status: He is alert and oriented to person, place, and time.     ED Results / Procedures / Treatments   Labs (all labs ordered are listed, but only abnormal results are displayed) Labs Reviewed - No data to display  EKG None  Radiology VAS [place] UPPER EXTREMITY VENOUS DUPLEX  Result Date: 12/15/2019 UPPER VENOUS STUDY  Indications: Pain Risk Factors: Surgery Right arm Dyalisis graft Brachial/ Cephalic. Performing Technologist: Griffin Basil RCT RDMS  Examination Guidelines: A complete evaluation includes B-mode imaging, spectral Doppler, color Doppler, and power Doppler as needed of all accessible portions of each vessel. Bilateral testing is considered an integral part of a complete examination. Limited examinations for reoccurring indications may be performed as noted.  Right Findings: +----------+------------+---------+-----------+----------+-------+ RIGHT     CompressiblePhasicitySpontaneousPropertiesSummary +----------+------------+---------+-----------+----------+-------+ IJV                      Yes       Yes                      +----------+------------+---------+-----------+----------+-------+ Subclavian               Yes       Yes                      +----------+------------+---------+-----------+----------+-------+ Axillary                 Yes       Yes                      +----------+------------+---------+-----------+----------+-------+ Brachial                 Yes       Yes                      +----------+------------+---------+-----------+----------+-------+ Radial        Full       Yes       Yes                       +----------+------------+---------+-----------+----------+-------+ Ulnar         Full  Yes       Yes                      +----------+------------+---------+-----------+----------+-------+ Cephalic                 Yes       Yes                      +----------+------------+---------+-----------+----------+-------+ No abnormailites seen in the vesssels of the right arm . Small cystic structure near cephalic vein antecubital area measuring 1.6x.6x1.4 cm,  Summary:  Right: Normal evaluation of right Dialysis fistula.  *See table(s) above for measurements and observations.    Preliminary     Procedures Procedures (including critical care time)  Medications Ordered in ED Medications - No data to display  ED Course  I have reviewed the triage vital signs and the nursing notes.  Pertinent labs & imaging results that were available during my care of the patient were reviewed by me and considered in my medical decision making (see chart for details).    MDM Rules/Calculators/A&P  34 year old male here with right arm pain.  Had dialysis fistula placed 11/17/2019.  States he just darted noticing swelling today.  No injury or trauma.  He is afebrile and nontoxic in appearance here.  Right arm is grossly normal in appearance without any erythema, cellulitic changes or tissue crepitus.  Fistula palpable in right upper extremity, strong thrill present.  His surgical incisions appear clean.  He localizes his pain mostly to the right antecubital area and into the distal bicep, does have minimal swelling of this area.  Vascular ultrasound of fistula was done earlier and is negative for any acute findings with the fistula.  There is a cyst noted in this area measuring 1.6 x 6 x 1.4.  This could be a source of pain/swelling.  Does not appear to have infected graft currently. Encouraged to monitor, may wish to elevate arm if swelling persists.  Close follow-up with nephrology/vascular if ongoing  issues.  Return here for any new/acute changes.  Final Clinical Impression(s) / ED Diagnoses Final diagnoses:  Right arm pain    Rx / DC Orders ED Discharge Orders    None       Larene Pickett, PA-C 12/15/19 2234    Veryl Speak, MD 12/15/19 2356

## 2019-12-15 NOTE — ED Notes (Signed)
Pt report right upper extremity pain.

## 2019-12-15 NOTE — ED Triage Notes (Signed)
Pt. Stated, I had dialysis today, but I had a fistulae put in the 1st of Oct.and yesterday when I woke up my rt. Were the fistulae is , it was so painful and swollen. Told to come here.

## 2019-12-15 NOTE — Progress Notes (Signed)
Patient ID: Samuel Burns, male    DOB: 1986/01/23  MRN: [id number]  CC: Hospitalization Follow-up   Subjective: Samuel Burns is a 34 y.o. male who presents for new patient visit post hospitalization.  His girlfriend [place] is with him His concerns today include:  ESRD on HD, hypertensive renal disease, anemia of chronic disease  Patient is a 34 year old African-American male who was hospitalized at [facility] 10/3-12/2019 with hypertensive emergency and acute renal failure.  Creatinine was 34, BUN of 257.  He was assessed to have end-stage renal disease likely due to long history of uncontrolled blood pressure.  He was placed on nicardipine drip and was seen by nephrology.  He underwent hemodialysis and had several sessions during his hospitalization.  Found to have elevated troponin levels but EKG without ST changes.  Cardiology assessed him to have demand ischemia due to acute renal failure and hypertensive emergency.  Echo revealed EF of 50 to 55% with grade 2 diastolic dysfunction and severe concentric LVH.  Moderate pericardial effusion seen on TEE without evidence of tamponade likely secondary to uremia.  Cardiology recommended TEE follow-up in 2 to 3 weeks.  He had an episode of a flutter that resolved with beta-blocker.  Hemoglobin 7.3 on presentation.  Work-up suggests anemia of chronic disease.  Transfused 1 unit of packed red blood cell.  Patient was discharge on metoprolol, amlodipine and Cozaar.  Today: Since discharge, he has been going to dialysis on Monday Wednesdays and Fridays.  Reports compliance with his blood pressure medications and low-salt diet.  He has a little swelling in the lower legs.  Denies any chest pains, shortness of breath.  Complains of swelling and pain in the right upper arm that started today.  This is where he had the AV fistula placed for dialysis.  He denies any fever.  He reports showing it to attack in dialysis today and was told just to take Tylenol. In  regards to the anemia, he reports receiving iron infusions during dialysis. 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  He has applied for disability and Medicaid.  Past medical, surgical, social history reviewed. Patient Active Problem List   Diagnosis Date Noted  . Thrombotic microangiopathy   . Anemia   . Thrombocytopenia ([place])   . Atrial flutter with rapid ventricular response ([place]) 11/21/2019  . ESRD (end stage renal disease) on dialysis ([place])   . Acute renal failure (ARF) ([place]) 11/19/2019  . Demand ischemia ([place])   . Hypertensive emergency   . Metabolic acidosis      Current Outpatient Medications on File Prior to Visit  Medication Sig Dispense Refill  . acetaminophen (TYLENOL) 500 MG tablet Take 500-1,000 mg by mouth every 6 (six) hours as needed (for pain).     No current facility-administered medications on file prior to visit.    No Known Allergies  Social History   Socioeconomic History  . Marital status: Married    Spouse name: Not on file  . Number of children: Not on file  . Years of education: Not on file  . Highest education level: Not on file  Occupational History  . Not on file  Tobacco Use  . Smoking status: Never Smoker  . Smokeless tobacco: Never Used  Substance and Sexual Activity  . Alcohol use: No  . Drug use: Yes    Types: Marijuana  . Sexual activity: Not on file  Other Topics Concern  . Not on file  Social History Narrative  .  Not on file   Social Determinants of Health   Financial Resource Strain:   . Difficulty of Paying Living Expenses: Not on file  Food Insecurity:   . Worried About Charity fundraiser in the Last Year: Not on file  . Ran Out of Food in the Last Year: Not on file  Transportation Needs:   . Lack of Transportation (Medical): Not on file  . Lack of Transportation (Non-Medical): Not on file  Physical Activity:   . Days of Exercise per Week: Not on file  . Minutes of Exercise per Session: Not on file  Stress:   .  Feeling of Stress : Not on file  Social Connections:   . Frequency of Communication with Friends and Family: Not on file  . Frequency of Social Gatherings with Friends and Family: Not on file  . Attends Religious Services: Not on file  . Active Member of Clubs or Organizations: Not on file  . Attends Archivist Meetings: Not on file  . Marital Status: Not on file  Intimate Partner Violence:   . Fear of Current or Ex-Partner: Not on file  . Emotionally Abused: Not on file  . Physically Abused: Not on file  . Sexually Abused: Not on file    Family History  Problem Relation Age of Onset  . Hypertension Maternal Grandmother     Past Surgical History:  Procedure Laterality Date  . AV FISTULA PLACEMENT Right 11/24/2019   Procedure: Creation of RIGHT ARM Brachial/Cephalic ARTERIOVENOUS (AV) FISTULA.;  Surgeon: [name], MD;  Location: [place];  Service: Vascular;  Laterality: Right;  . IR FLUORO GUIDE CV LINE RIGHT  11/20/2019  . IR US GUIDE VASC ACCESS RIGHT  11/20/2019    ROS: Review of Systems Negative except as stated above  PHYSICAL EXAM: BP (!) 197/125   Pulse 94   Resp 16   Ht 5' 7.5" (1.715 m)   Wt 172 lb 9.6 oz (78.3 kg)   SpO2 98%   BMI 26.63 kg/m   Physical Exam  General appearance - alert, well appearing, middle-aged African-American male and in no distress Mental status - normal mood, behavior, speech, dress, motor activity, and thought processes Eyes - pupils equal and reactive, extraocular eye movements intact Neck - supple, no significant adenopathy Chest - clear to auscultation, no wheezes, rales or rhonchi, symmetric air entry Heart -regular rate and rhythm.  No murmurs Extremities -trace lower extremity edema.  He has moderate swelling of the right upper arm.  It is tender to touch.  There is no erythema.  CMP Latest Ref Rng & Units 11/27/2019 11/26/2019 11/25/2019  Glucose 70 - 99 mg/dL 88 85 88  BUN 6 - 20 mg/dL 50(H) 41(H) 34(H)    Creatinine 0.61 - 1.24 mg/dL 11.88(H) 9.71(H) 7.81(H)  Sodium 135 - 145 mmol/L 134(L) 136 137  Potassium 3.5 - 5.1 mmol/L 3.9 4.0 3.9  Chloride 98 - 111 mmol/L 96(L) 97(L) 98  CO2 22 - 32 mmol/L 23 24 26   Calcium 8.9 - 10.3 mg/dL 8.9 8.8(L) 8.7(L)  Total Protein 6.5 - 8.1 g/dL - - -  Total Bilirubin 0.3 - 1.2 mg/dL - - -  Alkaline Phos 38 - 126 U/L - - -  AST 15 - 41 U/L - - -  ALT 0 - 44 U/L - - -   Lipid Panel     Component Value Date/Time   CHOL 186 11/20/2019 0428   CHOL 157 04/08/2017 1520  TRIG 207 (H) 11/20/2019 0428   HDL 30 (L) 11/20/2019 0428   HDL 39 (L) 04/08/2017 1520   CHOLHDL 6.2 11/20/2019 0428   VLDL 41 (H) 11/20/2019 0428   LDLCALC 115 (H) 11/20/2019 0428   LDLCALC 95 04/08/2017 1520    CBC    Component Value Date/Time   WBC 8.1 11/27/2019 0500   RBC 2.52 (L) 11/27/2019 0500   HGB 7.6 (L) 11/27/2019 0500   HCT 24.7 (L) 11/27/2019 0500   PLT 182 11/27/2019 0500   MCV 98.0 11/27/2019 0500   MCH 30.2 11/27/2019 0500   MCHC 30.8 11/27/2019 0500   RDW 16.5 (H) 11/27/2019 0500    ASSESSMENT AND PLAN:  1. Encounter to establish care   2. Hypertensive renal disease Blood pressure not controlled.  Continue, Cozaar, metoprolol.  To take twice a day. - amLODipine (NORVASC) 10 MG tablet; Take 1 tablet (10 mg total) by mouth daily.  Dispense: 30 tablet; Refill: 6 - losartan (COZAAR) 25 MG tablet; Take 1 tablet (25 mg total) by mouth daily.  Dispense: 30 tablet; Refill: 6 - Metoprolol Tartrate 75 MG TABS; Take 75 mg by mouth 2 (two) times daily.  Dispense: 120 tablet; Refill: 4 - cloNIDine (CATAPRES) 0.1 MG tablet; Take 1 tablet (0.1 mg total) by mouth 2 (two) times daily.  Dispense: 60 tablet; Refill: 3  3. ESRD on hemodialysis (HCC) - sevelamer carbonate (RENVELA) 800 MG tablet; Take 1 tablet (800 mg total) by mouth 3 (three) times daily with meals.  Dispense: 90 tablet; Refill: 3  4. Anemia of chronic disease Anemia of chronic disease end-stage renal  disease. - CBC  5. Swelling of upper arm Concern for possible clot in his AV fistula.  I spoke with the nephrologist on-call.  He recommends that patient be seen in the emergency room to have this evaluated.  6. Pericardial effusion Likely due to uremia at the time of hospital presentation.  Cardiologist has recommended repeat echo in 2 to 4 weeks. - ECHOCARDIOGRAM COMPLETE; Future   Patient was given the opportunity to ask questions.  Patient verbalized understanding of the plan and was able to repeat key elements of the plan.   Orders Placed This Encounter  Procedures  . CBC     Requested Prescriptions   Signed Prescriptions Disp Refills  . amLODipine (NORVASC) 10 MG tablet 30 tablet 6    Sig: Take 1 tablet (10 mg total) by mouth daily.  Marland Kitchen losartan (COZAAR) 25 MG tablet 30 tablet 6    Sig: Take 1 tablet (25 mg total) by mouth daily.  . Metoprolol Tartrate 75 MG TABS 120 tablet 4    Sig: Take 75 mg by mouth 2 (two) times daily.  . sevelamer carbonate (RENVELA) 800 MG tablet 90 tablet 3    Sig: Take 1 tablet (800 mg total) by mouth 3 (three) times daily with meals.  . cloNIDine (CATAPRES) 0.1 MG tablet 60 tablet 3    Sig: Take 1 tablet (0.1 mg total) by mouth 2 (two) times daily.    Return in about 2 months (around 02/14/2020).  Karle Plumber, MD, FACP

## 2019-12-16 ENCOUNTER — Encounter: Payer: Self-pay | Admitting: Internal Medicine

## 2019-12-16 LAB — CBC
Hematocrit: 32.3 % — ABNORMAL LOW (ref 37.5–51.0)
Hemoglobin: 10.4 g/dL — ABNORMAL LOW (ref 13.0–17.7)
MCH: 29.7 pg (ref 26.6–33.0)
MCHC: 32.2 g/dL (ref 31.5–35.7)
MCV: 92 fL (ref 79–97)
Platelets: 249 10*3/uL (ref 150–450)
RBC: 3.5 x10E6/uL — ABNORMAL LOW (ref 4.14–5.80)
RDW: 13 % (ref 11.6–15.4)
WBC: 8.6 10*3/uL (ref 3.4–10.8)

## 2019-12-25 ENCOUNTER — Other Ambulatory Visit (HOSPITAL_COMMUNITY): Payer: Self-pay | Admitting: Nephrology

## 2019-12-25 MED FILL — LOSARTAN POTASSIUM 25 MG TA: 25 | 30 days supply | Qty: 30 | Fill #0

## 2019-12-25 MED FILL — AMLODIPINE BESYLATE 10 MG T: 10 | 30 days supply | Qty: 30 | Fill #0

## 2020-01-03 ENCOUNTER — Other Ambulatory Visit (HOSPITAL_COMMUNITY): Payer: Self-pay | Admitting: Nephrology

## 2020-01-03 MED FILL — LOSARTAN POTASSIUM 50 MG TA: 50 | 90 days supply | Qty: 90 | Fill #0

## 2020-01-03 MED FILL — METOPROLOL TARTRATE 75 MG T: 75 | 90 days supply | Qty: 180 | Fill #0

## 2020-02-16 MED FILL — AMLODIPINE BESYLATE 10 MG T: 10 | 90 days supply | Qty: 90 | Fill #0

## 2020-03-12 ENCOUNTER — Ambulatory Visit (INDEPENDENT_AMBULATORY_CARE_PROVIDER_SITE_OTHER): Payer: Self-pay | Admitting: Physician Assistant

## 2020-03-12 ENCOUNTER — Other Ambulatory Visit: Payer: Self-pay

## 2020-03-12 VITALS — BP 135/84 | HR 89 | Temp 98.0°F | Resp 20 | Ht 67.5 in | Wt 169.5 lb

## 2020-03-12 DIAGNOSIS — Z992 Dependence on renal dialysis: Secondary | ICD-10-CM

## 2020-03-12 DIAGNOSIS — N186 End stage renal disease: Secondary | ICD-10-CM

## 2020-03-12 NOTE — Progress Notes (Signed)
Established Dialysis Access   History of Present Illness   Samuel Burns is a 35 y.o. ([birth year]) male who presents for re-evaluation of permanent access.  He is s/p R brachiocephalic fistula creation by Dr. Carlis Abbott on 11/24/19.  He is dialyzing via R IJ TDC on a MWF at the [facility].  TDC was placed by IR on 11/20/19.  ESRD is managed by Dr. Carolin Sicks.  He denies any signs or symptoms of steal syndrome.  The patient's PMH, PSH, SH, and FamHx were reviewed and  are unchanged from prior visit.  Current Outpatient Medications  Medication Sig Dispense Refill  . acetaminophen (TYLENOL) 500 MG tablet Take 500-1,000 mg by mouth every 6 (six) hours as needed (for pain).    Marland Kitchen amLODipine (NORVASC) 10 MG tablet Take 1 tablet (10 mg total) by mouth daily. 30 tablet 6  . cloNIDine (CATAPRES) 0.1 MG tablet Take 1 tablet (0.1 mg total) by mouth 2 (two) times daily. 60 tablet 3  . losartan (COZAAR) 50 MG tablet Take 50 mg by mouth daily.    . Metoprolol Tartrate 75 MG TABS Take 75 mg by mouth 2 (two) times daily. 120 tablet 4  . sevelamer carbonate (RENVELA) 800 MG tablet Take 1 tablet (800 mg total) by mouth 3 (three) times daily with meals. 90 tablet 3   No current facility-administered medications for this visit.    REVIEW OF SYSTEMS (negative unless checked):   Cardiac:  '[]'$  Chest pain or chest pressure? '[]'$  Shortness of breath upon activity? '[]'$  Shortness of breath when lying flat? '[]'$  Irregular heart rhythm?  Vascular:  '[]'$  Pain in calf, thigh, or hip brought on by walking? '[]'$  Pain in feet at night that wakes you up from your sleep? '[]'$  Blood clot in your veins? '[]'$  Leg swelling?  Pulmonary:  '[]'$  Oxygen at home? '[]'$  Productive cough? '[]'$  Wheezing?  Neurologic:  '[]'$  Sudden weakness in arms or legs? '[]'$  Sudden numbness in arms or legs? '[]'$  Sudden onset of difficult speaking or slurred speech? '[]'$  Temporary loss of vision in one eye? '[]'$  Problems with  dizziness?  Gastrointestinal:  '[]'$  Blood in stool? '[]'$  Vomited blood?  Genitourinary:  '[]'$  Burning when urinating? '[]'$  Blood in urine?  Psychiatric:  '[]'$  Major depression  Hematologic:  '[]'$  Bleeding problems? '[]'$  Problems with blood clotting?  Dermatologic:  '[]'$  Rashes or ulcers?  Constitutional:  '[]'$  Fever or chills?  Ear/Nose/Throat:  '[]'$  Change in hearing? '[]'$  Nose bleeds? '[]'$  Sore throat?  Musculoskeletal:  '[]'$  Back pain? '[]'$  Joint pain? '[]'$  Muscle pain?   Physical Examination   Vitals:   03/12/20 1021  BP: 135/84  Pulse: 89  Resp: 20  Temp: 98 F (36.7 C)  TempSrc: Temporal  SpO2: 98%  Weight: 169 lb 8 oz (76.9 kg)  Height: 5' 7.5" (1.715 m)   Body mass index is 26.16 kg/m.  General:  WDWN in NAD; vital signs documented above Gait: Not observed HENT: WNL, normocephalic Pulmonary: normal non-labored breathing Cardiac: regular HR Abdomen: soft, NT, no masses Skin: without rashes Vascular Exam/Pulses: symmetrical radial pulses; palpable thrill throughout R arm fistula; palpable side branches of fistula Musculoskeletal: no muscle wasting or atrophy  Neurologic: A&O X 3;  No focal weakness or paresthesias are detected Psychiatric:  The pt has Normal affect.    Medical Decision Making   Samuel Burns is a 35 y.o. male who presents with ESRD requiring hemodialysis.    R arm brachiocephalic fistula is patent without  steal symptoms of R hand  Based on physical exam, fistula is of adequate size for dialysis use; there are palpable side branches however there is a thrill throughout the cephalic vein in the upper arm  Plan will be to cannulate fistula for dialysis tomorrow  [facility] can be removed when Nephrology is comfortable with the performance of the fistula  Follow up as needed   Dagoberto Ligas PA-C [facility]: [facility] MD: Carlis Abbott

## 2020-04-17 ENCOUNTER — Other Ambulatory Visit (HOSPITAL_COMMUNITY): Payer: Self-pay | Admitting: Nephrology

## 2020-04-17 DIAGNOSIS — N186 End stage renal disease: Secondary | ICD-10-CM

## 2020-04-29 ENCOUNTER — Other Ambulatory Visit: Payer: Self-pay | Admitting: Radiology

## 2020-04-30 ENCOUNTER — Ambulatory Visit (HOSPITAL_COMMUNITY)
Admission: RE | Admit: 2020-04-30 | Discharge: 2020-04-30 | Disposition: A | Discharge status: Home / Self Care | Payer: Medicare Other | Source: Ambulatory Visit | Attending: Nephrology | Admitting: Nephrology

## 2020-04-30 ENCOUNTER — Encounter (HOSPITAL_COMMUNITY): Payer: Self-pay | Admitting: Interventional Radiology

## 2020-04-30 ENCOUNTER — Other Ambulatory Visit: Payer: Self-pay

## 2020-04-30 ENCOUNTER — Other Ambulatory Visit (HOSPITAL_COMMUNITY): Payer: Self-pay | Admitting: Nephrology

## 2020-04-30 DIAGNOSIS — I120 Hypertensive chronic kidney disease with stage 5 chronic kidney disease or end stage renal disease: Secondary | ICD-10-CM | POA: Insufficient documentation

## 2020-04-30 DIAGNOSIS — T82858A Stenosis of vascular prosthetic devices, implants and grafts, initial encounter: Secondary | ICD-10-CM | POA: Diagnosis present

## 2020-04-30 DIAGNOSIS — Z79899 Other long term (current) drug therapy: Secondary | ICD-10-CM | POA: Insufficient documentation

## 2020-04-30 DIAGNOSIS — N186 End stage renal disease: Secondary | ICD-10-CM | POA: Insufficient documentation

## 2020-04-30 DIAGNOSIS — Y841 Kidney dialysis as the cause of abnormal reaction of the patient, or of later complication, without mention of misadventure at the time of the procedure: Secondary | ICD-10-CM | POA: Insufficient documentation

## 2020-04-30 DIAGNOSIS — Z992 Dependence on renal dialysis: Secondary | ICD-10-CM | POA: Insufficient documentation

## 2020-04-30 HISTORY — PX: IR US GUIDE VASC ACCESS RIGHT: IMG2390

## 2020-04-30 HISTORY — PX: IR DIALY SHUNT INTRO NEEDLE/INTRACATH INITIAL W/IMG RIGHT: IMG6115

## 2020-04-30 LAB — CBC
HCT: 38.1 % — ABNORMAL LOW (ref 39.0–52.0)
Hemoglobin: 12.4 g/dL — ABNORMAL LOW (ref 13.0–17.0)
MCH: 32.6 pg (ref 26.0–34.0)
MCHC: 32.5 g/dL (ref 30.0–36.0)
MCV: 100.3 fL — ABNORMAL HIGH (ref 80.0–100.0)
Platelets: 193 10*3/uL (ref 150–400)
RBC: 3.8 MIL/uL — ABNORMAL LOW (ref 4.22–5.81)
RDW: 16.3 % — ABNORMAL HIGH (ref 11.5–15.5)
WBC: 5.1 10*3/uL (ref 4.0–10.5)
nRBC: 0 % (ref 0.0–0.2)

## 2020-04-30 LAB — BASIC METABOLIC PANEL
Anion gap: 12 (ref 5–15)
BUN: 44 mg/dL — ABNORMAL HIGH (ref 6–20)
CO2: 31 mmol/L (ref 22–32)
Calcium: 9.8 mg/dL (ref 8.9–10.3)
Chloride: 95 mmol/L — ABNORMAL LOW (ref 98–111)
Creatinine, Ser: 8.12 mg/dL — ABNORMAL HIGH (ref 0.61–1.24)
GFR, Estimated: 8 mL/min — ABNORMAL LOW (ref >60)
Glucose, Bld: 83 mg/dL (ref 70–99)
Potassium: 4.1 mmol/L (ref 3.5–5.1)
Sodium: 138 mmol/L (ref 135–145)

## 2020-04-30 MED ORDER — MIDAZOLAM HCL 2 MG/2ML IJ SOLN
INTRAMUSCULAR | Status: AC
  Filled 2020-04-30: qty 4

## 2020-04-30 MED ORDER — FENTANYL CITRATE (PF) 100 MCG/2ML IJ SOLN
INTRAMUSCULAR | Status: AC
  Filled 2020-04-30: qty 2

## 2020-04-30 MED ORDER — IOHEXOL 300 MG/ML  SOLN
100.0000 mL | Freq: Once | INTRAMUSCULAR | Status: AC | PRN
  Administered 2020-04-30: 30 mL via INTRAVENOUS

## 2020-04-30 MED ORDER — MIDAZOLAM HCL 2 MG/2ML IJ SOLN
INTRAMUSCULAR | Status: AC | PRN
  Administered 2020-04-30 (×3): 1 mg via INTRAVENOUS

## 2020-04-30 MED ORDER — HEPARIN SODIUM (PORCINE) 1000 UNIT/ML IJ SOLN
INTRAMUSCULAR | Status: AC
  Filled 2020-04-30: qty 1

## 2020-04-30 MED ORDER — LIDOCAINE HCL (PF) 1 % IJ SOLN
INTRAMUSCULAR | Status: AC | PRN
  Administered 2020-04-30: 5 mL

## 2020-04-30 MED ORDER — FENTANYL CITRATE (PF) 100 MCG/2ML IJ SOLN
INTRAMUSCULAR | Status: AC | PRN
  Administered 2020-04-30: 50 ug via INTRAVENOUS
  Administered 2020-04-30: 25 ug via INTRAVENOUS
  Administered 2020-04-30: 50 ug via INTRAVENOUS

## 2020-04-30 MED ORDER — IOHEXOL 300 MG/ML  SOLN
50.0000 mL | Freq: Once | INTRAMUSCULAR | Status: AC | PRN
  Administered 2020-04-30: 10 mL via INTRAVENOUS

## 2020-04-30 MED ORDER — LIDOCAINE HCL 1 % IJ SOLN
INTRAMUSCULAR | Status: AC
  Filled 2020-04-30: qty 20

## 2020-04-30 MED ORDER — HEPARIN SODIUM (PORCINE) 1000 UNIT/ML IJ SOLN
INTRAMUSCULAR | Status: AC | PRN
  Administered 2020-04-30: 3000 [IU] via INTRAVENOUS

## 2020-04-30 MED ORDER — SODIUM CHLORIDE 0.9 % IV SOLN: INTRAVENOUS | Status: DC

## 2020-04-30 NOTE — Discharge Instructions (Signed)
Dialysis Fistulogram, Care After The following information offers guidance on how to care for yourself after your procedure. Your health care provider may also give you more specific instructions. If you have problems or questions, contact your health care provider. What can I expect after the procedure? After the procedure, it is common to have:  A small amount of discomfort in the area where the small tube (catheter) was placed for the procedure.  A small amount of bruising around the fistula.  Sleepiness and tiredness (fatigue). Follow these instructions at home: Puncture site care  Follow instructions from your health care provider about how to take care of the site where catheters were inserted. Make sure you: ? Wash your hands with soap and water for at least 20 seconds before and after you change your bandage (dressing). If soap and water are not available, use hand sanitizer. ? Change your dressing as told by your health care provider. ? Leave stitches (sutures), skin glue, or adhesive strips in place. These skin closures may need to stay in place for 2 weeks or longer. If adhesive strip edges start to loosen and curl up, you may trim the loose edges. Do not remove adhesive strips completely unless your health care provider tells you to do that.  Check your puncture area every day for signs of infection. Check for: ? More redness, swelling, or pain. ? Fluid or blood. ? Warmth. ? Pus or a bad smell.   Activity  Rest as much as you can.  If you were given a sedative during the procedure, it can affect you for several hours. Do not drive or operate machinery until your health care provider says that it is safe.  Do not lift anything that is heavier than 5 lb (2.3 kg), or the limit that you are told, on the day of your procedure.  Do not do anything strenuous with your arm for the rest of the day. Avoid household activities, such as vacuuming.  Return to your normal activities as  told by your health care provider. Ask your health care provider what activities are safe for you. Safety To prevent damage to your graft or fistula:  Do not wear tight-fitting clothing or jewelry on the arm or leg that has your graft or fistula.  Tell all your health care providers that you have a dialysis fistula or graft.  Do not allow blood draws, IVs, or blood pressure readings to be done in the arm that has your fistula or graft.  Do not allow flu shots or vaccinations in the arm with your fistula or graft. General instructions  Take over-the-counter and prescription medicines only as told by your health care provider.  Do not take baths, swim, or use a hot tub until your health care provider approves. Ask your health care provider if you may take showers. You may only be allowed to take sponge baths.  Monitor your dialysis fistula closely. Check to make sure that you can feel a vibration or buzz (a thrill) when you put your fingers over the fistula.  Keep all follow-up visits. This is important. Contact a health care provider if:  You have more redness, swelling, or pain at the site where the catheter was put in.  You have fluid or blood coming from the catheter site.  You have pus or a bad smell coming from the catheter site.  Your catheter site feels warm.  You have a fever or chills. Get help right away if:    You have bleeding from the vascular access site that does not stop.  You feel weak.  You have trouble balancing.  You have trouble moving your arms or legs.  You have problems with your speech or vision.  You can no longer feel a vibration or buzz when you put your fingers over your fistula.  The limb that was used for the procedure swells or becomes painful, cold, blue, or pale white.  You have chest pain or shortness of breath. These symptoms may represent a serious problem that is an emergency. Do not wait to see if the symptoms will go away. Get  medical help right away. Call your local emergency services (911 in the [place] Do not drive yourself to the hospital. Summary  After a dialysis fistulogram, it is common to have a small amount of discomfort or bruising in the area where the small, thin tube (catheter) was placed.  Rest as much as you can after your procedure. Return to your normal activities as told by your health care provider.  Take over-the-counter and prescription medicines only as told by your health care provider.  Follow instructions from your health care provider about how to take care of the site where the catheter was inserted.  Keep all follow-up visits. This is important. This information is not intended to replace advice given to you by your health care provider. Make sure you discuss any questions you have with your health care provider. Document Revised: 09/13/2019 Document Reviewed: 09/13/2019 Elsevier Patient Education  2021 [employer]  

## 2020-04-30 NOTE — H&P (Signed)
Chief Complaint: Difficulty accessing fistula during dialysis. Request is for fistulogram  For right upper arm brachial cephalic AVF created on 123456.  Referring Physician(s): Rosita Fire  Supervising Physician: Jacqulynn Cadet  Patient Status: [facility] - Out-pt  History of Present Illness: Samuel Burns is a 35 y.o. male History of HTN, ESRD s/p Right upper arm brachial cephalic AVF created on 123456. Patient states that they are unable to access the fistula and he has been utilizing his tunneled RIJ hemodialysis catheter placed a RIJ placed on  10.4.22. Team is requesting a fistulogram with possible intervention.   Currently without any significant complaints. Patient alert and laying in bed, calm and comfortable. Denies any fevers, headache, chest pain, SOB, cough, abdominal pain, nausea, vomiting or bleeding. Discussed fistulogram procedure  with patient. All questions were answered and concerns were addressed. Patient agrees with plan.     Past Medical History:  Diagnosis Date  . Asthma   . Renal failure     Past Surgical History:  Procedure Laterality Date  . AV FISTULA PLACEMENT Right 11/24/2019   Procedure: Creation of RIGHT ARM Brachial/Cephalic ARTERIOVENOUS (AV) FISTULA.;  Surgeon: [name], MD;  Location: [place];  Service: Vascular;  Laterality: Right;  . IR FLUORO GUIDE CV LINE RIGHT  11/20/2019  . IR US GUIDE VASC ACCESS RIGHT  11/20/2019    Allergies: Patient has no known allergies.  Medications: Prior to Admission medications   Medication Sig Start Date End Date Taking? Authorizing Provider  acetaminophen (TYLENOL) 500 MG tablet Take 1,000 mg by mouth every 6 (six) hours as needed for moderate pain.   Yes [provider]  amLODipine (NORVASC) 10 MG tablet Take 1 tablet (10 mg total) by mouth daily. Patient taking differently: Take 10 mg by mouth at bedtime. 12/15/19  Yes Ladell Pier, MD  cloNIDine (CATAPRES) 0.1 MG  tablet Take 1 tablet (0.1 mg total) by mouth 2 (two) times daily. 12/15/19  Yes Ladell Pier, MD  ELDERBERRY PO Take 1 capsule by mouth daily.   Yes [provider]  ferric citrate (AURYXIA) 1 GM 210 MG(Fe) tablet Take 420 mg by mouth See admin instructions. Take 420 mg 3 times daily with meals and each snack   Yes [provider]  lidocaine-prilocaine (EMLA) cream Apply 1 application topically as needed (access fistula).   Yes [provider]  losartan (COZAAR) 50 MG tablet Take 50 mg by mouth daily. 01/15/20  Yes [provider]  Metoprolol Tartrate 75 MG TABS Take 75 mg by mouth 2 (two) times daily. 12/15/19  Yes Ladell Pier, MD  Naphazoline HCl (CLEAR EYES OP) Place 1 drop into both eyes daily as needed (irritation).   Yes [provider]  sevelamer carbonate (RENVELA) 800 MG tablet Take 1 tablet (800 mg total) by mouth 3 (three) times daily with meals. Patient not taking: No sig reported 12/15/19   Ladell Pier, MD     Family History  Problem Relation Age of Onset  . Hypertension Maternal Grandmother     Social History   Socioeconomic History  . Marital status: Significant Other    Spouse name: Not on file  . Number of children: Not on file  . Years of education: Not on file  . Highest education level: Not on file  Occupational History  . Not on file  Tobacco Use  . Smoking status: Never Smoker  . Smokeless tobacco: Never Used  Substance and Sexual Activity  . Alcohol  use: No  . Drug use: Yes    Types: Marijuana  . Sexual activity: Not on file  Other Topics Concern  . Not on file  Social History Narrative  . Not on file   Social Determinants of Health   Financial Resource Strain: Not on file  Food Insecurity: Not on file  Transportation Needs: Not on file  Physical Activity: Not on file  Stress: Not on file  Social Connections: Not on file      Review of Systems: A 12 point ROS discussed and  pertinent positives are indicated in the HPI above.  All other systems are negative.  Review of Systems  Constitutional: Negative for fever.  HENT: Negative for congestion.   Respiratory: Negative for cough and shortness of breath.   Cardiovascular: Negative for chest pain.  Gastrointestinal: Negative for abdominal pain.  Neurological: Negative for headaches.  Psychiatric/Behavioral: Negative for behavioral problems and confusion.    Vital Signs: BP (!) 147/86   Pulse 63   Temp 97.8 F (36.6 C) (Oral)   Resp 20   Ht '5\' 8"'$  (1.727 m)   Wt 168 lb (76.2 kg)   SpO2 100%   BMI 25.54 kg/m   Physical Exam Vitals and nursing note reviewed.  Constitutional:      Appearance: He is well-developed.  HENT:     Head: Normocephalic.  Cardiovascular:     Rate and Rhythm: Normal rate and regular rhythm.     Heart sounds: Normal heart sounds.     Comments: RUE brachial cephalic AVF +/+ Pulmonary:     Effort: Pulmonary effort is normal.     Breath sounds: Normal breath sounds.  Musculoskeletal:        General: Normal range of motion.     Cervical back: Normal range of motion.  Skin:    General: Skin is dry.  Neurological:     Mental Status: He is alert and oriented to person, place, and time.     Imaging: No results found.  Labs:  CBC: Recent Labs    11/26/19 0305 11/27/19 0500 12/15/19 1546 04/30/20 0805  WBC 8.6 8.1 8.6 5.1  HGB 7.8* 7.6* 10.4* 12.4*  HCT 25.2* 24.7* 32.3* 38.1*  PLT 168 182 249 193    COAGS: Recent Labs    11/20/19 1105  INR 1.1  APTT 47*    BMP: Recent Labs    11/19/19 0906 11/20/19 0428 11/21/19 0018 11/22/19 0220 11/24/19 0310 11/25/19 0703 11/26/19 0305 11/27/19 0500  NA 135 134* 137   < > 137 137 136 134*  K 3.7 3.7 3.3*   < > 3.7 3.9 4.0 3.9  CL 89* 92* 95*   < > 97* 98 97* 96*  CO2 15* 13* 16*   < > '23 26 24 23  '$ GLUCOSE 131* 109* 102*   < > 92 88 85 88  BUN 257* 266* 182*   < > 75* 34* 41* 50*  CALCIUM 9.5 8.7* 8.6*   <  > 9.1 8.7* 8.8* 8.9  CREATININE 34.21* 35.25* 23.62*   < > 12.67* 7.81* 9.71* 11.88*  GFRNONAA 1* 1* 2*   < > 5* 8* 6* 5*  GFRAA 2* 2* 2*  --   --   --   --   --    < > = values in this interval not displayed.    LIVER FUNCTION TESTS: Recent Labs    11/19/19 0906 11/20/19 0428 11/21/19 0018 11/22/19 0220 11/23/19 0325 11/27/19 0500  BILITOT 2.3*  --  2.4* 2.7* 1.9*  --   AST 13*  --  9* 15 21  --   ALT 21  --  '12 14 22  '$ --   ALKPHOS 232*  --  146* 167* 129*  --   PROT 7.9  --  6.3* 7.2 6.1*  --   ALBUMIN 4.5   < > 3.4* 3.7 3.2* 3.2*   < > = values in this interval not displayed.    Assessment and Plan:  35 y.o. male. History of HTN, ESRD s/p Right upper arm brachial cephalic AVF created on 123456. Patient states that they are unable to access the fistula and he has been utilizing his tunneled RIJ hemodialysis catheter placed a RIJ placed on  10.4.22. Team is requesting a fistulogram with possible intervention.  No pertinent imaging. No prior fistulogram.  Labs unremarkable. NKDA. Patient has been NPO since midnight.   Risks and benefits discussed with the patient including, but not limited to bleeding, infection, vascular injury, pulmonary embolism, need for tunneled HD catheter placement or even death.  All of the patient's questions were answered, patient is agreeable to proceed. Consent signed and in chart.    Thank you for this interesting consult.  I greatly enjoyed meeting Abdisamad Marine and look forward to participating in their care.  A copy of this report was sent to the requesting provider on this date.  Electronically Signed: Jacqualine Mau, NP 04/30/2020, 9:06 AM   I spent a total of  30 Minutes   in face to face in clinical consultation, greater than 50% of which was counseling/coordinating care for fistulogram with possible intervention.

## 2020-04-30 NOTE — Procedures (Signed)
Interventional Radiology Procedure Note  Procedure: Fistulogram and angioplasty.   Complications: None  Estimated Blood Loss: None  Recommendations: - DC home   Signed,  Criselda Peaches, MD

## 2020-05-23 ENCOUNTER — Ambulatory Visit: Payer: Self-pay | Attending: Internal Medicine | Admitting: Internal Medicine

## 2020-05-23 VITALS — BP 118/71 | HR 96 | Ht 67.25 in | Wt 174.4 lb

## 2020-05-23 DIAGNOSIS — N186 End stage renal disease: Secondary | ICD-10-CM

## 2020-05-23 DIAGNOSIS — Z992 Dependence on renal dialysis: Secondary | ICD-10-CM

## 2020-05-23 DIAGNOSIS — I129 Hypertensive chronic kidney disease with stage 1 through stage 4 chronic kidney disease, or unspecified chronic kidney disease: Secondary | ICD-10-CM

## 2020-05-23 DIAGNOSIS — Z23 Encounter for immunization: Secondary | ICD-10-CM

## 2020-05-23 MED ORDER — METOPROLOL TARTRATE 75 MG PO TABS: 1.0000 | ORAL_TABLET | Freq: Two times a day (BID) | ORAL | 3 refills | Status: DC

## 2020-05-23 MED ORDER — AMLODIPINE BESYLATE 10 MG PO TABS: ORAL_TABLET | Freq: Every day | ORAL | 6 refills | Status: DC

## 2020-05-23 MED ORDER — LOSARTAN POTASSIUM 25 MG PO TABS: ORAL_TABLET | Freq: Every day | ORAL | 6 refills | Status: DC

## 2020-05-23 NOTE — Progress Notes (Signed)
Patient ID: Samuel Burns, male    DOB: Jun 21, 1985  MRN: [id number]  CC: No chief complaint on file.   Subjective: Samuel Burns is a 35 y.o. male who presents for chronic ds management.  Last seen 11/2019 His concerns today include:  ESRD on HD, hypertensive renal disease, anemia of chronic disease  ESRD/HTN: Goes to dialysis Monday Wednesday and Fridays.  His dialysis graft in the right upper arm is now being used.  He is requesting to have his permacath removed from the right upper chest.  Reports being told by the nurse at the dialysis center earlier this week that someone will call him to have it removed but he has not received a call as yet.   No CP/SOB/LE edema/palpitations Dizziness sometimes when on HD Limits salt in his foods Check BP home 2 x a wk and gives range of 120-140/75-90 Compliant with his blood pressure medications.  He confirms that he is on clonidine, metoprolol 75 mg twice a day, amlodipine 10 mg daily, Cozaar 25 mg daily -Reports that he has good family support and is trying to cope mentally with being on dialysis because he has a 48-year-old son who he wants to be there for.  Patient's father died of chronic kidney disease about a year ago.  HM: Due for COVID booster.  Plans to get it. Due for Tdap Patient Active Problem List   Diagnosis Date Noted  . Pericardial effusion 12/15/2019  . Hypertensive renal disease 12/15/2019  . Thrombotic microangiopathy   . Anemia of chronic disease   . Thrombocytopenia ([place])   . Atrial flutter with rapid ventricular response ([place]) 11/21/2019  . ESRD (end stage renal disease) on dialysis ([place])   . Acute renal failure (ARF) ([place]) 11/19/2019  . Demand ischemia ([place])   . Hypertensive emergency   . Metabolic acidosis      Current Outpatient Medications on File Prior to Visit  Medication Sig Dispense Refill  . acetaminophen (TYLENOL) 500 MG tablet Take 1,000 mg by mouth every 6 (six) hours as needed for moderate pain.    Marland Kitchen  amLODipine (NORVASC) 10 MG tablet TAKE 1 TABLET BY MOUTH EVERY DAY 30 tablet 6  . cloNIDine (CATAPRES) 0.1 MG tablet TAKE 1 TABLET (0.1 MG TOTAL) BY MOUTH 2 (TWO) TIMES DAILY. 60 tablet 3  . ELDERBERRY PO Take 1 capsule by mouth daily.    . ferric citrate (AURYXIA) 1 GM 210 MG(Fe) tablet Take 420 mg by mouth See admin instructions. Take 420 mg 3 times daily with meals and each snack    . lidocaine-prilocaine (EMLA) cream Apply 1 application topically as needed (access fistula).    . losartan (COZAAR) 25 MG tablet TAKE 1 TABLET BY MOUTH EVERY DAY 30 tablet 6  . Metoprolol Tartrate 75 MG TABS TAKE 1 TABLET BY MOUTH TWICE DAILY. 180 tablet 3  . Naphazoline HCl (CLEAR EYES OP) Place 1 drop into both eyes daily as needed (irritation). (Patient not taking: Reported on 05/23/2020)    . sevelamer carbonate (RENVELA) 800 MG tablet Take 1 tablet (800 mg total) by mouth 3 (three) times daily with meals. (Patient not taking: No sig reported) 90 tablet 3   No current facility-administered medications on file prior to visit.    No Known Allergies  Social History   Socioeconomic History  . Marital status: Significant Other    Spouse name: Not on file  . Number of children: Not on file  . Years of education: Not on file  .  Highest education level: Not on file  Occupational History  . Not on file  Tobacco Use  . Smoking status: Never Smoker  . Smokeless tobacco: Never Used  Substance and Sexual Activity  . Alcohol use: No  . Drug use: Yes    Types: Marijuana  . Sexual activity: Not on file  Other Topics Concern  . Not on file  Social History Narrative  . Not on file   Social Determinants of Health   Financial Resource Strain: Not on file  Food Insecurity: Not on file  Transportation Needs: Not on file  Physical Activity: Not on file  Stress: Not on file  Social Connections: Not on file  Intimate Partner Violence: Not on file    Family History  Problem Relation Age of Onset  .  Hypertension Maternal Grandmother     Past Surgical History:  Procedure Laterality Date  . AV FISTULA PLACEMENT Right 11/24/2019   Procedure: Creation of RIGHT ARM Brachial/Cephalic ARTERIOVENOUS (AV) FISTULA.;  Surgeon: [name], MD;  Location: [place];  Service: Vascular;  Laterality: Right;  . IR DIALY SHUNT INTRO [place] W/IMG RIGHT Right 04/30/2020  . IR FLUORO GUIDE CV LINE RIGHT  11/20/2019  . IR US GUIDE VASC ACCESS RIGHT  11/20/2019  . IR US GUIDE VASC ACCESS RIGHT  04/30/2020    ROS: Review of Systems Negative except as stated above  PHYSICAL EXAM: BP 118/71   Pulse 96   Ht 5' 7.25" (1.708 m)   Wt 174 lb 6.4 oz (79.1 kg)   SpO2 97%   BMI 27.11 kg/m   Physical Exam Repeat BP 140/90  General appearance - alert, well appearing, young African-American male and in no distress Mental status - normal mood, behavior, speech, dress, motor activity, and thought processes Chest - clear to auscultation, no wheezes, rales or rhonchi, symmetric air entry Heart - normal rate, regular rhythm, normal S1, S2, no murmurs, rubs, clicks or gallops Extremities -no lower extremity edema.  Dialysis graft in right upper extremity palpable Skin: Has double-lumen catheter in the anterior upper right chest wall CMP Latest Ref Rng & Units 04/30/2020 11/27/2019 11/26/2019  Glucose 70 - 99 mg/dL 83 88 85  BUN 6 - 20 mg/dL 44(H) 50(H) 41(H)  Creatinine 0.61 - 1.24 mg/dL 8.12(H) 11.88(H) 9.71(H)  Sodium 135 - 145 mmol/L 138 134(L) 136  Potassium 3.5 - 5.1 mmol/L 4.1 3.9 4.0  Chloride 98 - 111 mmol/L 95(L) 96(L) 97(L)  CO2 22 - 32 mmol/L '31 23 24  '$ Calcium 8.9 - 10.3 mg/dL 9.8 8.9 8.8(L)  Total Protein 6.5 - 8.1 g/dL - - -  Total Bilirubin 0.3 - 1.2 mg/dL - - -  Alkaline Phos 38 - 126 U/L - - -  AST 15 - 41 U/L - - -  ALT 0 - 44 U/L - - -   Lipid Panel     Component Value Date/Time   CHOL 186 11/20/2019 0428   CHOL 157 04/08/2017 1520   TRIG 207 (H) 11/20/2019 0428    HDL 30 (L) 11/20/2019 0428   HDL 39 (L) 04/08/2017 1520   CHOLHDL 6.2 11/20/2019 0428   VLDL 41 (H) 11/20/2019 0428   LDLCALC 115 (H) 11/20/2019 0428   LDLCALC 95 04/08/2017 1520    CBC    Component Value Date/Time   WBC 5.1 04/30/2020 0805   RBC 3.80 (L) 04/30/2020 0805   HGB 12.4 (L) 04/30/2020 0805   HGB 10.4 (L) 12/15/2019 1546   HCT  38.1 (L) 04/30/2020 0805   HCT 32.3 (L) 12/15/2019 1546   PLT 193 04/30/2020 0805   PLT 249 12/15/2019 1546   MCV 100.3 (H) 04/30/2020 0805   MCV 92 12/15/2019 1546   MCH 32.6 04/30/2020 0805   MCHC 32.5 04/30/2020 0805   RDW 16.3 (H) 04/30/2020 0805   RDW 13.0 12/15/2019 1546    ASSESSMENT AND PLAN: 1. Hypertensive renal disease Initial blood pressure reading at goal.  He will continue his current medications and low-salt diet. - Metoprolol Tartrate 75 MG TABS; TAKE 1 TABLET BY MOUTH TWICE DAILY.  Dispense: 180 tablet; Refill: 3 - losartan (COZAAR) 25 MG tablet; TAKE 1 TABLET BY MOUTH EVERY DAY  Dispense: 30 tablet; Refill: 6 - amLODipine (NORVASC) 10 MG tablet; TAKE 1 TABLET BY MOUTH EVERY DAY  Dispense: 30 tablet; Refill: 6  2. ESRD on hemodialysis ([place]) Continue compliance with going to dialysis 3 days a week. Recommend that he speak with the dialysis nurse tomorrow to let him or her know that he has not received a call as yet as was promised for someone to pull out the catheter from his right upper chest that is no longer being used.  3. Need for Tdap vaccination Given today.    Patient was given the opportunity to ask questions.  Patient verbalized understanding of the plan and was able to repeat key elements of the plan.   No orders of the defined types were placed in this encounter.    Requested Prescriptions   Pending Prescriptions Disp Refills  . Metoprolol Tartrate 75 MG TABS 180 tablet 3    Sig: TAKE 1 TABLET BY MOUTH TWICE DAILY.  Marland Kitchen losartan (COZAAR) 25 MG tablet 30 tablet 6    Sig: TAKE 1 TABLET BY MOUTH EVERY  DAY  . amLODipine (NORVASC) 10 MG tablet 30 tablet 6    Sig: TAKE 1 TABLET BY MOUTH EVERY DAY    No follow-ups on file.  Karle Plumber, MD, FACP

## 2020-05-28 ENCOUNTER — Other Ambulatory Visit (HOSPITAL_COMMUNITY): Payer: Self-pay | Admitting: Nephrology

## 2020-05-28 DIAGNOSIS — Z992 Dependence on renal dialysis: Secondary | ICD-10-CM

## 2020-05-28 DIAGNOSIS — N186 End stage renal disease: Secondary | ICD-10-CM

## 2020-06-04 ENCOUNTER — Other Ambulatory Visit: Payer: Self-pay

## 2020-06-04 ENCOUNTER — Ambulatory Visit (HOSPITAL_COMMUNITY)
Admission: RE | Admit: 2020-06-04 | Discharge: 2020-06-04 | Disposition: A | Discharge status: Home / Self Care | Payer: Medicare Other | Source: Ambulatory Visit | Attending: Nephrology | Admitting: Nephrology

## 2020-06-04 DIAGNOSIS — N186 End stage renal disease: Secondary | ICD-10-CM | POA: Insufficient documentation

## 2020-06-04 DIAGNOSIS — Z4901 Encounter for fitting and adjustment of extracorporeal dialysis catheter: Secondary | ICD-10-CM | POA: Diagnosis not present

## 2020-06-04 DIAGNOSIS — Z992 Dependence on renal dialysis: Secondary | ICD-10-CM

## 2020-06-04 HISTORY — PX: IR REMOVAL TUN CV CATH W/O FL: IMG2289

## 2020-06-04 MED ORDER — LIDOCAINE HCL (PF) 1 % IJ SOLN
INTRAMUSCULAR | Status: DC | PRN
  Administered 2020-06-04: 30 mL

## 2020-06-04 MED ORDER — LIDOCAINE HCL 1 % IJ SOLN
INTRAMUSCULAR | Status: AC
  Filled 2020-06-04: qty 20

## 2020-07-17 DIAGNOSIS — N186 End stage renal disease: Secondary | ICD-10-CM | POA: Diagnosis not present

## 2020-07-17 DIAGNOSIS — Z992 Dependence on renal dialysis: Secondary | ICD-10-CM | POA: Diagnosis not present

## 2020-07-17 DIAGNOSIS — D631 Anemia in chronic kidney disease: Secondary | ICD-10-CM | POA: Diagnosis not present

## 2020-07-17 DIAGNOSIS — D509 Iron deficiency anemia, unspecified: Secondary | ICD-10-CM | POA: Diagnosis not present

## 2020-07-17 DIAGNOSIS — D689 Coagulation defect, unspecified: Secondary | ICD-10-CM | POA: Diagnosis not present

## 2020-07-17 DIAGNOSIS — N2581 Secondary hyperparathyroidism of renal origin: Secondary | ICD-10-CM | POA: Diagnosis not present

## 2020-07-19 DIAGNOSIS — N186 End stage renal disease: Secondary | ICD-10-CM | POA: Diagnosis not present

## 2020-07-19 DIAGNOSIS — N2581 Secondary hyperparathyroidism of renal origin: Secondary | ICD-10-CM | POA: Diagnosis not present

## 2020-07-19 DIAGNOSIS — Z992 Dependence on renal dialysis: Secondary | ICD-10-CM | POA: Diagnosis not present

## 2020-07-19 DIAGNOSIS — D689 Coagulation defect, unspecified: Secondary | ICD-10-CM | POA: Diagnosis not present

## 2020-07-19 DIAGNOSIS — D509 Iron deficiency anemia, unspecified: Secondary | ICD-10-CM | POA: Diagnosis not present

## 2020-07-19 DIAGNOSIS — D631 Anemia in chronic kidney disease: Secondary | ICD-10-CM | POA: Diagnosis not present

## 2020-07-22 DIAGNOSIS — D509 Iron deficiency anemia, unspecified: Secondary | ICD-10-CM | POA: Diagnosis not present

## 2020-07-22 DIAGNOSIS — N186 End stage renal disease: Secondary | ICD-10-CM | POA: Diagnosis not present

## 2020-07-22 DIAGNOSIS — D689 Coagulation defect, unspecified: Secondary | ICD-10-CM | POA: Diagnosis not present

## 2020-07-22 DIAGNOSIS — Z992 Dependence on renal dialysis: Secondary | ICD-10-CM | POA: Diagnosis not present

## 2020-07-22 DIAGNOSIS — N2581 Secondary hyperparathyroidism of renal origin: Secondary | ICD-10-CM | POA: Diagnosis not present

## 2020-07-24 DIAGNOSIS — N186 End stage renal disease: Secondary | ICD-10-CM | POA: Diagnosis not present

## 2020-07-24 DIAGNOSIS — Z992 Dependence on renal dialysis: Secondary | ICD-10-CM | POA: Diagnosis not present

## 2020-07-24 DIAGNOSIS — N2581 Secondary hyperparathyroidism of renal origin: Secondary | ICD-10-CM | POA: Diagnosis not present

## 2020-07-24 DIAGNOSIS — D509 Iron deficiency anemia, unspecified: Secondary | ICD-10-CM | POA: Diagnosis not present

## 2020-07-24 DIAGNOSIS — D689 Coagulation defect, unspecified: Secondary | ICD-10-CM | POA: Diagnosis not present

## 2020-07-25 ENCOUNTER — Encounter: Payer: Self-pay | Admitting: Internal Medicine

## 2020-07-25 DIAGNOSIS — I10 Essential (primary) hypertension: Secondary | ICD-10-CM | POA: Diagnosis not present

## 2020-07-25 DIAGNOSIS — N184 Chronic kidney disease, stage 4 (severe): Secondary | ICD-10-CM | POA: Diagnosis not present

## 2020-07-26 DIAGNOSIS — D689 Coagulation defect, unspecified: Secondary | ICD-10-CM | POA: Diagnosis not present

## 2020-07-26 DIAGNOSIS — N2581 Secondary hyperparathyroidism of renal origin: Secondary | ICD-10-CM | POA: Diagnosis not present

## 2020-07-26 DIAGNOSIS — Z992 Dependence on renal dialysis: Secondary | ICD-10-CM | POA: Diagnosis not present

## 2020-07-26 DIAGNOSIS — N186 End stage renal disease: Secondary | ICD-10-CM | POA: Diagnosis not present

## 2020-07-26 DIAGNOSIS — D509 Iron deficiency anemia, unspecified: Secondary | ICD-10-CM | POA: Diagnosis not present

## 2020-07-29 DIAGNOSIS — Z992 Dependence on renal dialysis: Secondary | ICD-10-CM | POA: Diagnosis not present

## 2020-07-29 DIAGNOSIS — D689 Coagulation defect, unspecified: Secondary | ICD-10-CM | POA: Diagnosis not present

## 2020-07-29 DIAGNOSIS — N186 End stage renal disease: Secondary | ICD-10-CM | POA: Diagnosis not present

## 2020-07-29 DIAGNOSIS — N2581 Secondary hyperparathyroidism of renal origin: Secondary | ICD-10-CM | POA: Diagnosis not present

## 2020-07-30 ENCOUNTER — Telehealth: Payer: Medicare Other | Admitting: Internal Medicine

## 2020-07-30 ENCOUNTER — Other Ambulatory Visit: Payer: Self-pay

## 2020-07-30 ENCOUNTER — Ambulatory Visit: Payer: Medicare Other | Attending: Internal Medicine | Admitting: Internal Medicine

## 2020-07-30 DIAGNOSIS — N529 Male erectile dysfunction, unspecified: Secondary | ICD-10-CM

## 2020-07-30 MED ORDER — SILDENAFIL CITRATE 50 MG PO TABS: ORAL_TABLET | ORAL | 2 refills | Status: DC

## 2020-07-30 NOTE — Progress Notes (Signed)
Virtual Visit via Telephone Note  I connected with Samuel Burns on 07/30/2020 at 1:23 p.m by telephone and verified that I am speaking with the correct person using two identifiers  Location: Patient: home Provider: office  Participants: Myself Patient   I discussed the limitations, risks, security and privacy concerns of performing an evaluation and management service by telephone and the availability of in person appointments. I also discussed with the patient that there may be a patient responsible charge related to this service. The patient expressed understanding and agreed to proceed.   History of Present Illness: ESRD on HD, hypertensive renal disease, anemia of chronic disease.  Last seen 05/2020.  This is UC visit to discuss ED.   Patient complains of problems maintaining an erection for the past 1 year.  He is wanting to try Viagra or similar medication.  Reports being seen at [facility] last week Thursday for physical because his Medicaid had changed his PCP.  However he requested that it be changed back to me.  He tells me that his blood pressure on that visit was 160/87.  The physician there increased his Cozaar from 25 milligrams to 50 mg daily.  Outpatient Encounter Medications as of 07/30/2020  Medication Sig   acetaminophen (TYLENOL) 500 MG tablet Take 1,000 mg by mouth every 6 (six) hours as needed for moderate pain.   amLODipine (NORVASC) 10 MG tablet TAKE 1 TABLET BY MOUTH EVERY DAY   cloNIDine (CATAPRES) 0.1 MG tablet TAKE 1 TABLET (0.1 MG TOTAL) BY MOUTH 2 (TWO) TIMES DAILY.   ELDERBERRY PO Take 1 capsule by mouth daily.   ferric citrate (AURYXIA) 1 GM 210 MG(Fe) tablet Take 420 mg by mouth See admin instructions. Take 420 mg 3 times daily with meals and each snack   lidocaine-prilocaine (EMLA) cream Apply 1 application topically as needed (access fistula).   losartan (COZAAR) 25 MG tablet TAKE 1 TABLET BY MOUTH EVERY DAY   Metoprolol Tartrate 75 MG TABS  TAKE 1 TABLET BY MOUTH TWICE DAILY.   Naphazoline HCl (CLEAR EYES OP) Place 1 drop into both eyes daily as needed (irritation). (Patient not taking: Reported on 05/23/2020)   sevelamer carbonate (RENVELA) 800 MG tablet Take 1 tablet (800 mg total) by mouth 3 (three) times daily with meals. (Patient not taking: No sig reported)   No facility-administered encounter medications on file as of 07/30/2020.      Observations/Objective: No direct observation done as this was a telephone visit.  Assessment and Plan: 1. Erectile dysfunction, unspecified erectile dysfunction type Discussed putting him on Viagra 50 mg to use as needed.  Went over with him how the medication works and possible side effects.  Advised to be seen in the emergency room if he has an erection lasting longer than 2 to 3 hours, sudden hearing loss or sudden vision changes. - sildenafil (VIAGRA) 50 MG tablet; 1 tab PO 1/2 hr prior to sex PRN.  Limit use to 1 tab/24 hr  Dispense: 10 tablet; Refill: 2   Follow Up Instructions: Mid August   I discussed the assessment and treatment plan with the patient. The patient was provided an opportunity to ask questions and all were answered. The patient agreed with the plan and demonstrated an understanding of the instructions.   The patient was advised to call back or seek an in-person evaluation if the symptoms worsen or if the condition fails to improve as anticipated.  I  Spent 10 minutes on this telephone encounter  Karle Plumber, MD

## 2020-07-31 DIAGNOSIS — N186 End stage renal disease: Secondary | ICD-10-CM | POA: Diagnosis not present

## 2020-07-31 DIAGNOSIS — D689 Coagulation defect, unspecified: Secondary | ICD-10-CM | POA: Diagnosis not present

## 2020-07-31 DIAGNOSIS — N2581 Secondary hyperparathyroidism of renal origin: Secondary | ICD-10-CM | POA: Diagnosis not present

## 2020-07-31 DIAGNOSIS — Z992 Dependence on renal dialysis: Secondary | ICD-10-CM | POA: Diagnosis not present

## 2020-08-01 DIAGNOSIS — N186 End stage renal disease: Secondary | ICD-10-CM | POA: Diagnosis not present

## 2020-08-01 DIAGNOSIS — T82858A Stenosis of vascular prosthetic devices, implants and grafts, initial encounter: Secondary | ICD-10-CM | POA: Diagnosis not present

## 2020-08-01 DIAGNOSIS — Z992 Dependence on renal dialysis: Secondary | ICD-10-CM | POA: Diagnosis not present

## 2020-08-01 DIAGNOSIS — I871 Compression of vein: Secondary | ICD-10-CM | POA: Diagnosis not present

## 2020-08-02 DIAGNOSIS — N186 End stage renal disease: Secondary | ICD-10-CM | POA: Diagnosis not present

## 2020-08-02 DIAGNOSIS — N2581 Secondary hyperparathyroidism of renal origin: Secondary | ICD-10-CM | POA: Diagnosis not present

## 2020-08-02 DIAGNOSIS — Z992 Dependence on renal dialysis: Secondary | ICD-10-CM | POA: Diagnosis not present

## 2020-08-02 DIAGNOSIS — D689 Coagulation defect, unspecified: Secondary | ICD-10-CM | POA: Diagnosis not present

## 2020-08-05 DIAGNOSIS — Z992 Dependence on renal dialysis: Secondary | ICD-10-CM | POA: Diagnosis not present

## 2020-08-05 DIAGNOSIS — D689 Coagulation defect, unspecified: Secondary | ICD-10-CM | POA: Diagnosis not present

## 2020-08-05 DIAGNOSIS — N2581 Secondary hyperparathyroidism of renal origin: Secondary | ICD-10-CM | POA: Diagnosis not present

## 2020-08-05 DIAGNOSIS — N186 End stage renal disease: Secondary | ICD-10-CM | POA: Diagnosis not present

## 2020-08-07 DIAGNOSIS — Z992 Dependence on renal dialysis: Secondary | ICD-10-CM | POA: Diagnosis not present

## 2020-08-07 DIAGNOSIS — N186 End stage renal disease: Secondary | ICD-10-CM | POA: Diagnosis not present

## 2020-08-07 DIAGNOSIS — D689 Coagulation defect, unspecified: Secondary | ICD-10-CM | POA: Diagnosis not present

## 2020-08-07 DIAGNOSIS — N2581 Secondary hyperparathyroidism of renal origin: Secondary | ICD-10-CM | POA: Diagnosis not present

## 2020-08-09 DIAGNOSIS — N186 End stage renal disease: Secondary | ICD-10-CM | POA: Diagnosis not present

## 2020-08-09 DIAGNOSIS — N2581 Secondary hyperparathyroidism of renal origin: Secondary | ICD-10-CM | POA: Diagnosis not present

## 2020-08-09 DIAGNOSIS — D689 Coagulation defect, unspecified: Secondary | ICD-10-CM | POA: Diagnosis not present

## 2020-08-09 DIAGNOSIS — Z992 Dependence on renal dialysis: Secondary | ICD-10-CM | POA: Diagnosis not present

## 2020-08-12 DIAGNOSIS — N186 End stage renal disease: Secondary | ICD-10-CM | POA: Diagnosis not present

## 2020-08-12 DIAGNOSIS — D689 Coagulation defect, unspecified: Secondary | ICD-10-CM | POA: Diagnosis not present

## 2020-08-12 DIAGNOSIS — Z992 Dependence on renal dialysis: Secondary | ICD-10-CM | POA: Diagnosis not present

## 2020-08-12 DIAGNOSIS — N2581 Secondary hyperparathyroidism of renal origin: Secondary | ICD-10-CM | POA: Diagnosis not present

## 2020-08-12 DIAGNOSIS — D631 Anemia in chronic kidney disease: Secondary | ICD-10-CM | POA: Diagnosis not present

## 2020-08-14 DIAGNOSIS — N186 End stage renal disease: Secondary | ICD-10-CM | POA: Diagnosis not present

## 2020-08-14 DIAGNOSIS — D631 Anemia in chronic kidney disease: Secondary | ICD-10-CM | POA: Diagnosis not present

## 2020-08-14 DIAGNOSIS — N2581 Secondary hyperparathyroidism of renal origin: Secondary | ICD-10-CM | POA: Diagnosis not present

## 2020-08-14 DIAGNOSIS — D689 Coagulation defect, unspecified: Secondary | ICD-10-CM | POA: Diagnosis not present

## 2020-08-14 DIAGNOSIS — Z992 Dependence on renal dialysis: Secondary | ICD-10-CM | POA: Diagnosis not present

## 2020-08-15 DIAGNOSIS — I129 Hypertensive chronic kidney disease with stage 1 through stage 4 chronic kidney disease, or unspecified chronic kidney disease: Secondary | ICD-10-CM | POA: Diagnosis not present

## 2020-08-15 DIAGNOSIS — N186 End stage renal disease: Secondary | ICD-10-CM | POA: Diagnosis not present

## 2020-08-15 DIAGNOSIS — Z992 Dependence on renal dialysis: Secondary | ICD-10-CM | POA: Diagnosis not present

## 2020-08-16 DIAGNOSIS — D631 Anemia in chronic kidney disease: Secondary | ICD-10-CM | POA: Diagnosis not present

## 2020-08-16 DIAGNOSIS — Z992 Dependence on renal dialysis: Secondary | ICD-10-CM | POA: Diagnosis not present

## 2020-08-16 DIAGNOSIS — D689 Coagulation defect, unspecified: Secondary | ICD-10-CM | POA: Diagnosis not present

## 2020-08-16 DIAGNOSIS — N2581 Secondary hyperparathyroidism of renal origin: Secondary | ICD-10-CM | POA: Diagnosis not present

## 2020-08-16 DIAGNOSIS — N186 End stage renal disease: Secondary | ICD-10-CM | POA: Diagnosis not present

## 2020-08-19 DIAGNOSIS — Z992 Dependence on renal dialysis: Secondary | ICD-10-CM | POA: Diagnosis not present

## 2020-08-19 DIAGNOSIS — N186 End stage renal disease: Secondary | ICD-10-CM | POA: Diagnosis not present

## 2020-08-19 DIAGNOSIS — N2581 Secondary hyperparathyroidism of renal origin: Secondary | ICD-10-CM | POA: Diagnosis not present

## 2020-08-19 DIAGNOSIS — D631 Anemia in chronic kidney disease: Secondary | ICD-10-CM | POA: Diagnosis not present

## 2020-08-19 DIAGNOSIS — D689 Coagulation defect, unspecified: Secondary | ICD-10-CM | POA: Diagnosis not present

## 2020-08-21 DIAGNOSIS — D631 Anemia in chronic kidney disease: Secondary | ICD-10-CM | POA: Diagnosis not present

## 2020-08-21 DIAGNOSIS — D689 Coagulation defect, unspecified: Secondary | ICD-10-CM | POA: Diagnosis not present

## 2020-08-21 DIAGNOSIS — N186 End stage renal disease: Secondary | ICD-10-CM | POA: Diagnosis not present

## 2020-08-21 DIAGNOSIS — Z992 Dependence on renal dialysis: Secondary | ICD-10-CM | POA: Diagnosis not present

## 2020-08-21 DIAGNOSIS — N2581 Secondary hyperparathyroidism of renal origin: Secondary | ICD-10-CM | POA: Diagnosis not present

## 2020-08-23 DIAGNOSIS — D689 Coagulation defect, unspecified: Secondary | ICD-10-CM | POA: Diagnosis not present

## 2020-08-23 DIAGNOSIS — D631 Anemia in chronic kidney disease: Secondary | ICD-10-CM | POA: Diagnosis not present

## 2020-08-23 DIAGNOSIS — N186 End stage renal disease: Secondary | ICD-10-CM | POA: Diagnosis not present

## 2020-08-23 DIAGNOSIS — N2581 Secondary hyperparathyroidism of renal origin: Secondary | ICD-10-CM | POA: Diagnosis not present

## 2020-08-23 DIAGNOSIS — Z992 Dependence on renal dialysis: Secondary | ICD-10-CM | POA: Diagnosis not present

## 2020-08-26 DIAGNOSIS — D631 Anemia in chronic kidney disease: Secondary | ICD-10-CM | POA: Diagnosis not present

## 2020-08-26 DIAGNOSIS — N186 End stage renal disease: Secondary | ICD-10-CM | POA: Diagnosis not present

## 2020-08-26 DIAGNOSIS — Z992 Dependence on renal dialysis: Secondary | ICD-10-CM | POA: Diagnosis not present

## 2020-08-26 DIAGNOSIS — D689 Coagulation defect, unspecified: Secondary | ICD-10-CM | POA: Diagnosis not present

## 2020-08-26 DIAGNOSIS — N2581 Secondary hyperparathyroidism of renal origin: Secondary | ICD-10-CM | POA: Diagnosis not present

## 2020-08-28 DIAGNOSIS — Z992 Dependence on renal dialysis: Secondary | ICD-10-CM | POA: Diagnosis not present

## 2020-08-28 DIAGNOSIS — N186 End stage renal disease: Secondary | ICD-10-CM | POA: Diagnosis not present

## 2020-08-28 DIAGNOSIS — N2581 Secondary hyperparathyroidism of renal origin: Secondary | ICD-10-CM | POA: Diagnosis not present

## 2020-08-28 DIAGNOSIS — D689 Coagulation defect, unspecified: Secondary | ICD-10-CM | POA: Diagnosis not present

## 2020-08-28 DIAGNOSIS — D631 Anemia in chronic kidney disease: Secondary | ICD-10-CM | POA: Diagnosis not present

## 2020-08-30 DIAGNOSIS — N186 End stage renal disease: Secondary | ICD-10-CM | POA: Diagnosis not present

## 2020-08-30 DIAGNOSIS — Z992 Dependence on renal dialysis: Secondary | ICD-10-CM | POA: Diagnosis not present

## 2020-08-30 DIAGNOSIS — D631 Anemia in chronic kidney disease: Secondary | ICD-10-CM | POA: Diagnosis not present

## 2020-08-30 DIAGNOSIS — N2581 Secondary hyperparathyroidism of renal origin: Secondary | ICD-10-CM | POA: Diagnosis not present

## 2020-08-30 DIAGNOSIS — D689 Coagulation defect, unspecified: Secondary | ICD-10-CM | POA: Diagnosis not present

## 2020-09-02 DIAGNOSIS — D689 Coagulation defect, unspecified: Secondary | ICD-10-CM | POA: Diagnosis not present

## 2020-09-02 DIAGNOSIS — N2581 Secondary hyperparathyroidism of renal origin: Secondary | ICD-10-CM | POA: Diagnosis not present

## 2020-09-02 DIAGNOSIS — Z992 Dependence on renal dialysis: Secondary | ICD-10-CM | POA: Diagnosis not present

## 2020-09-02 DIAGNOSIS — N186 End stage renal disease: Secondary | ICD-10-CM | POA: Diagnosis not present

## 2020-09-02 DIAGNOSIS — D631 Anemia in chronic kidney disease: Secondary | ICD-10-CM | POA: Diagnosis not present

## 2020-09-04 DIAGNOSIS — Z992 Dependence on renal dialysis: Secondary | ICD-10-CM | POA: Diagnosis not present

## 2020-09-04 DIAGNOSIS — D689 Coagulation defect, unspecified: Secondary | ICD-10-CM | POA: Diagnosis not present

## 2020-09-04 DIAGNOSIS — D631 Anemia in chronic kidney disease: Secondary | ICD-10-CM | POA: Diagnosis not present

## 2020-09-04 DIAGNOSIS — N2581 Secondary hyperparathyroidism of renal origin: Secondary | ICD-10-CM | POA: Diagnosis not present

## 2020-09-04 DIAGNOSIS — N186 End stage renal disease: Secondary | ICD-10-CM | POA: Diagnosis not present

## 2020-09-06 DIAGNOSIS — D631 Anemia in chronic kidney disease: Secondary | ICD-10-CM | POA: Diagnosis not present

## 2020-09-06 DIAGNOSIS — N2581 Secondary hyperparathyroidism of renal origin: Secondary | ICD-10-CM | POA: Diagnosis not present

## 2020-09-06 DIAGNOSIS — D689 Coagulation defect, unspecified: Secondary | ICD-10-CM | POA: Diagnosis not present

## 2020-09-06 DIAGNOSIS — Z992 Dependence on renal dialysis: Secondary | ICD-10-CM | POA: Diagnosis not present

## 2020-09-06 DIAGNOSIS — N186 End stage renal disease: Secondary | ICD-10-CM | POA: Diagnosis not present

## 2020-09-09 DIAGNOSIS — N2581 Secondary hyperparathyroidism of renal origin: Secondary | ICD-10-CM | POA: Diagnosis not present

## 2020-09-09 DIAGNOSIS — Z992 Dependence on renal dialysis: Secondary | ICD-10-CM | POA: Diagnosis not present

## 2020-09-09 DIAGNOSIS — N186 End stage renal disease: Secondary | ICD-10-CM | POA: Diagnosis not present

## 2020-09-09 DIAGNOSIS — D689 Coagulation defect, unspecified: Secondary | ICD-10-CM | POA: Diagnosis not present

## 2020-09-09 DIAGNOSIS — D631 Anemia in chronic kidney disease: Secondary | ICD-10-CM | POA: Diagnosis not present

## 2020-09-11 DIAGNOSIS — N186 End stage renal disease: Secondary | ICD-10-CM | POA: Diagnosis not present

## 2020-09-11 DIAGNOSIS — D689 Coagulation defect, unspecified: Secondary | ICD-10-CM | POA: Diagnosis not present

## 2020-09-11 DIAGNOSIS — N2581 Secondary hyperparathyroidism of renal origin: Secondary | ICD-10-CM | POA: Diagnosis not present

## 2020-09-11 DIAGNOSIS — D631 Anemia in chronic kidney disease: Secondary | ICD-10-CM | POA: Diagnosis not present

## 2020-09-11 DIAGNOSIS — Z992 Dependence on renal dialysis: Secondary | ICD-10-CM | POA: Diagnosis not present

## 2020-09-13 DIAGNOSIS — N186 End stage renal disease: Secondary | ICD-10-CM | POA: Diagnosis not present

## 2020-09-13 DIAGNOSIS — D689 Coagulation defect, unspecified: Secondary | ICD-10-CM | POA: Diagnosis not present

## 2020-09-13 DIAGNOSIS — N2581 Secondary hyperparathyroidism of renal origin: Secondary | ICD-10-CM | POA: Diagnosis not present

## 2020-09-13 DIAGNOSIS — D631 Anemia in chronic kidney disease: Secondary | ICD-10-CM | POA: Diagnosis not present

## 2020-09-13 DIAGNOSIS — Z992 Dependence on renal dialysis: Secondary | ICD-10-CM | POA: Diagnosis not present

## 2020-09-15 DIAGNOSIS — N186 End stage renal disease: Secondary | ICD-10-CM | POA: Diagnosis not present

## 2020-09-15 DIAGNOSIS — Z992 Dependence on renal dialysis: Secondary | ICD-10-CM | POA: Diagnosis not present

## 2020-09-15 DIAGNOSIS — I129 Hypertensive chronic kidney disease with stage 1 through stage 4 chronic kidney disease, or unspecified chronic kidney disease: Secondary | ICD-10-CM | POA: Diagnosis not present

## 2020-09-16 DIAGNOSIS — N2581 Secondary hyperparathyroidism of renal origin: Secondary | ICD-10-CM | POA: Diagnosis not present

## 2020-09-16 DIAGNOSIS — D689 Coagulation defect, unspecified: Secondary | ICD-10-CM | POA: Diagnosis not present

## 2020-09-16 DIAGNOSIS — Z992 Dependence on renal dialysis: Secondary | ICD-10-CM | POA: Diagnosis not present

## 2020-09-16 DIAGNOSIS — D631 Anemia in chronic kidney disease: Secondary | ICD-10-CM | POA: Diagnosis not present

## 2020-09-16 DIAGNOSIS — N186 End stage renal disease: Secondary | ICD-10-CM | POA: Diagnosis not present

## 2020-09-18 DIAGNOSIS — N2581 Secondary hyperparathyroidism of renal origin: Secondary | ICD-10-CM | POA: Diagnosis not present

## 2020-09-18 DIAGNOSIS — Z992 Dependence on renal dialysis: Secondary | ICD-10-CM | POA: Diagnosis not present

## 2020-09-18 DIAGNOSIS — N186 End stage renal disease: Secondary | ICD-10-CM | POA: Diagnosis not present

## 2020-09-18 DIAGNOSIS — D631 Anemia in chronic kidney disease: Secondary | ICD-10-CM | POA: Diagnosis not present

## 2020-09-18 DIAGNOSIS — D689 Coagulation defect, unspecified: Secondary | ICD-10-CM | POA: Diagnosis not present

## 2020-09-20 DIAGNOSIS — Z992 Dependence on renal dialysis: Secondary | ICD-10-CM | POA: Diagnosis not present

## 2020-09-20 DIAGNOSIS — D689 Coagulation defect, unspecified: Secondary | ICD-10-CM | POA: Diagnosis not present

## 2020-09-20 DIAGNOSIS — N186 End stage renal disease: Secondary | ICD-10-CM | POA: Diagnosis not present

## 2020-09-20 DIAGNOSIS — D631 Anemia in chronic kidney disease: Secondary | ICD-10-CM | POA: Diagnosis not present

## 2020-09-20 DIAGNOSIS — N2581 Secondary hyperparathyroidism of renal origin: Secondary | ICD-10-CM | POA: Diagnosis not present

## 2020-09-23 DIAGNOSIS — Z992 Dependence on renal dialysis: Secondary | ICD-10-CM | POA: Diagnosis not present

## 2020-09-23 DIAGNOSIS — D631 Anemia in chronic kidney disease: Secondary | ICD-10-CM | POA: Diagnosis not present

## 2020-09-23 DIAGNOSIS — D689 Coagulation defect, unspecified: Secondary | ICD-10-CM | POA: Diagnosis not present

## 2020-09-23 DIAGNOSIS — N2581 Secondary hyperparathyroidism of renal origin: Secondary | ICD-10-CM | POA: Diagnosis not present

## 2020-09-23 DIAGNOSIS — N186 End stage renal disease: Secondary | ICD-10-CM | POA: Diagnosis not present

## 2020-09-25 DIAGNOSIS — N2581 Secondary hyperparathyroidism of renal origin: Secondary | ICD-10-CM | POA: Diagnosis not present

## 2020-09-25 DIAGNOSIS — Z992 Dependence on renal dialysis: Secondary | ICD-10-CM | POA: Diagnosis not present

## 2020-09-25 DIAGNOSIS — D689 Coagulation defect, unspecified: Secondary | ICD-10-CM | POA: Diagnosis not present

## 2020-09-25 DIAGNOSIS — D631 Anemia in chronic kidney disease: Secondary | ICD-10-CM | POA: Diagnosis not present

## 2020-09-25 DIAGNOSIS — N186 End stage renal disease: Secondary | ICD-10-CM | POA: Diagnosis not present

## 2020-09-27 DIAGNOSIS — Z992 Dependence on renal dialysis: Secondary | ICD-10-CM | POA: Diagnosis not present

## 2020-09-27 DIAGNOSIS — D689 Coagulation defect, unspecified: Secondary | ICD-10-CM | POA: Diagnosis not present

## 2020-09-27 DIAGNOSIS — D631 Anemia in chronic kidney disease: Secondary | ICD-10-CM | POA: Diagnosis not present

## 2020-09-27 DIAGNOSIS — N2581 Secondary hyperparathyroidism of renal origin: Secondary | ICD-10-CM | POA: Diagnosis not present

## 2020-09-27 DIAGNOSIS — N186 End stage renal disease: Secondary | ICD-10-CM | POA: Diagnosis not present

## 2020-09-30 DIAGNOSIS — D689 Coagulation defect, unspecified: Secondary | ICD-10-CM | POA: Diagnosis not present

## 2020-09-30 DIAGNOSIS — D631 Anemia in chronic kidney disease: Secondary | ICD-10-CM | POA: Diagnosis not present

## 2020-09-30 DIAGNOSIS — N2581 Secondary hyperparathyroidism of renal origin: Secondary | ICD-10-CM | POA: Diagnosis not present

## 2020-09-30 DIAGNOSIS — N186 End stage renal disease: Secondary | ICD-10-CM | POA: Diagnosis not present

## 2020-09-30 DIAGNOSIS — Z992 Dependence on renal dialysis: Secondary | ICD-10-CM | POA: Diagnosis not present

## 2020-10-02 DIAGNOSIS — N186 End stage renal disease: Secondary | ICD-10-CM | POA: Diagnosis not present

## 2020-10-02 DIAGNOSIS — Z992 Dependence on renal dialysis: Secondary | ICD-10-CM | POA: Diagnosis not present

## 2020-10-02 DIAGNOSIS — D631 Anemia in chronic kidney disease: Secondary | ICD-10-CM | POA: Diagnosis not present

## 2020-10-02 DIAGNOSIS — N2581 Secondary hyperparathyroidism of renal origin: Secondary | ICD-10-CM | POA: Diagnosis not present

## 2020-10-02 DIAGNOSIS — D689 Coagulation defect, unspecified: Secondary | ICD-10-CM | POA: Diagnosis not present

## 2020-10-04 DIAGNOSIS — N2581 Secondary hyperparathyroidism of renal origin: Secondary | ICD-10-CM | POA: Diagnosis not present

## 2020-10-04 DIAGNOSIS — N186 End stage renal disease: Secondary | ICD-10-CM | POA: Diagnosis not present

## 2020-10-04 DIAGNOSIS — D689 Coagulation defect, unspecified: Secondary | ICD-10-CM | POA: Diagnosis not present

## 2020-10-04 DIAGNOSIS — D631 Anemia in chronic kidney disease: Secondary | ICD-10-CM | POA: Diagnosis not present

## 2020-10-04 DIAGNOSIS — Z992 Dependence on renal dialysis: Secondary | ICD-10-CM | POA: Diagnosis not present

## 2020-10-07 DIAGNOSIS — N2581 Secondary hyperparathyroidism of renal origin: Secondary | ICD-10-CM | POA: Diagnosis not present

## 2020-10-07 DIAGNOSIS — Z992 Dependence on renal dialysis: Secondary | ICD-10-CM | POA: Diagnosis not present

## 2020-10-07 DIAGNOSIS — N186 End stage renal disease: Secondary | ICD-10-CM | POA: Diagnosis not present

## 2020-10-07 DIAGNOSIS — D631 Anemia in chronic kidney disease: Secondary | ICD-10-CM | POA: Diagnosis not present

## 2020-10-07 DIAGNOSIS — D689 Coagulation defect, unspecified: Secondary | ICD-10-CM | POA: Diagnosis not present

## 2020-10-09 DIAGNOSIS — Z992 Dependence on renal dialysis: Secondary | ICD-10-CM | POA: Diagnosis not present

## 2020-10-09 DIAGNOSIS — D689 Coagulation defect, unspecified: Secondary | ICD-10-CM | POA: Diagnosis not present

## 2020-10-09 DIAGNOSIS — N186 End stage renal disease: Secondary | ICD-10-CM | POA: Diagnosis not present

## 2020-10-09 DIAGNOSIS — N2581 Secondary hyperparathyroidism of renal origin: Secondary | ICD-10-CM | POA: Diagnosis not present

## 2020-10-09 DIAGNOSIS — D631 Anemia in chronic kidney disease: Secondary | ICD-10-CM | POA: Diagnosis not present

## 2020-10-11 DIAGNOSIS — D631 Anemia in chronic kidney disease: Secondary | ICD-10-CM | POA: Diagnosis not present

## 2020-10-11 DIAGNOSIS — Z992 Dependence on renal dialysis: Secondary | ICD-10-CM | POA: Diagnosis not present

## 2020-10-11 DIAGNOSIS — D689 Coagulation defect, unspecified: Secondary | ICD-10-CM | POA: Diagnosis not present

## 2020-10-11 DIAGNOSIS — N186 End stage renal disease: Secondary | ICD-10-CM | POA: Diagnosis not present

## 2020-10-11 DIAGNOSIS — N2581 Secondary hyperparathyroidism of renal origin: Secondary | ICD-10-CM | POA: Diagnosis not present

## 2020-10-14 DIAGNOSIS — D631 Anemia in chronic kidney disease: Secondary | ICD-10-CM | POA: Diagnosis not present

## 2020-10-14 DIAGNOSIS — Z992 Dependence on renal dialysis: Secondary | ICD-10-CM | POA: Diagnosis not present

## 2020-10-14 DIAGNOSIS — N186 End stage renal disease: Secondary | ICD-10-CM | POA: Diagnosis not present

## 2020-10-14 DIAGNOSIS — N2581 Secondary hyperparathyroidism of renal origin: Secondary | ICD-10-CM | POA: Diagnosis not present

## 2020-10-14 DIAGNOSIS — D689 Coagulation defect, unspecified: Secondary | ICD-10-CM | POA: Diagnosis not present

## 2020-10-16 DIAGNOSIS — I129 Hypertensive chronic kidney disease with stage 1 through stage 4 chronic kidney disease, or unspecified chronic kidney disease: Secondary | ICD-10-CM | POA: Diagnosis not present

## 2020-10-16 DIAGNOSIS — D689 Coagulation defect, unspecified: Secondary | ICD-10-CM | POA: Diagnosis not present

## 2020-10-16 DIAGNOSIS — D631 Anemia in chronic kidney disease: Secondary | ICD-10-CM | POA: Diagnosis not present

## 2020-10-16 DIAGNOSIS — Z992 Dependence on renal dialysis: Secondary | ICD-10-CM | POA: Diagnosis not present

## 2020-10-16 DIAGNOSIS — N2581 Secondary hyperparathyroidism of renal origin: Secondary | ICD-10-CM | POA: Diagnosis not present

## 2020-10-16 DIAGNOSIS — N186 End stage renal disease: Secondary | ICD-10-CM | POA: Diagnosis not present

## 2020-10-18 DIAGNOSIS — D631 Anemia in chronic kidney disease: Secondary | ICD-10-CM | POA: Diagnosis not present

## 2020-10-18 DIAGNOSIS — Z992 Dependence on renal dialysis: Secondary | ICD-10-CM | POA: Diagnosis not present

## 2020-10-18 DIAGNOSIS — D689 Coagulation defect, unspecified: Secondary | ICD-10-CM | POA: Diagnosis not present

## 2020-10-18 DIAGNOSIS — N2581 Secondary hyperparathyroidism of renal origin: Secondary | ICD-10-CM | POA: Diagnosis not present

## 2020-10-18 DIAGNOSIS — N186 End stage renal disease: Secondary | ICD-10-CM | POA: Diagnosis not present

## 2020-10-21 DIAGNOSIS — N2581 Secondary hyperparathyroidism of renal origin: Secondary | ICD-10-CM | POA: Diagnosis not present

## 2020-10-21 DIAGNOSIS — D631 Anemia in chronic kidney disease: Secondary | ICD-10-CM | POA: Diagnosis not present

## 2020-10-21 DIAGNOSIS — N186 End stage renal disease: Secondary | ICD-10-CM | POA: Diagnosis not present

## 2020-10-21 DIAGNOSIS — D689 Coagulation defect, unspecified: Secondary | ICD-10-CM | POA: Diagnosis not present

## 2020-10-21 DIAGNOSIS — Z992 Dependence on renal dialysis: Secondary | ICD-10-CM | POA: Diagnosis not present

## 2020-10-23 DIAGNOSIS — N2581 Secondary hyperparathyroidism of renal origin: Secondary | ICD-10-CM | POA: Diagnosis not present

## 2020-10-23 DIAGNOSIS — N186 End stage renal disease: Secondary | ICD-10-CM | POA: Diagnosis not present

## 2020-10-23 DIAGNOSIS — D631 Anemia in chronic kidney disease: Secondary | ICD-10-CM | POA: Diagnosis not present

## 2020-10-23 DIAGNOSIS — D689 Coagulation defect, unspecified: Secondary | ICD-10-CM | POA: Diagnosis not present

## 2020-10-23 DIAGNOSIS — Z992 Dependence on renal dialysis: Secondary | ICD-10-CM | POA: Diagnosis not present

## 2020-10-25 DIAGNOSIS — D689 Coagulation defect, unspecified: Secondary | ICD-10-CM | POA: Diagnosis not present

## 2020-10-25 DIAGNOSIS — N2581 Secondary hyperparathyroidism of renal origin: Secondary | ICD-10-CM | POA: Diagnosis not present

## 2020-10-25 DIAGNOSIS — N186 End stage renal disease: Secondary | ICD-10-CM | POA: Diagnosis not present

## 2020-10-25 DIAGNOSIS — Z992 Dependence on renal dialysis: Secondary | ICD-10-CM | POA: Diagnosis not present

## 2020-10-25 DIAGNOSIS — D631 Anemia in chronic kidney disease: Secondary | ICD-10-CM | POA: Diagnosis not present

## 2020-10-28 DIAGNOSIS — D689 Coagulation defect, unspecified: Secondary | ICD-10-CM | POA: Diagnosis not present

## 2020-10-28 DIAGNOSIS — N2581 Secondary hyperparathyroidism of renal origin: Secondary | ICD-10-CM | POA: Diagnosis not present

## 2020-10-28 DIAGNOSIS — D631 Anemia in chronic kidney disease: Secondary | ICD-10-CM | POA: Diagnosis not present

## 2020-10-28 DIAGNOSIS — N186 End stage renal disease: Secondary | ICD-10-CM | POA: Diagnosis not present

## 2020-10-28 DIAGNOSIS — Z992 Dependence on renal dialysis: Secondary | ICD-10-CM | POA: Diagnosis not present

## 2020-10-30 DIAGNOSIS — N2581 Secondary hyperparathyroidism of renal origin: Secondary | ICD-10-CM | POA: Diagnosis not present

## 2020-10-30 DIAGNOSIS — D631 Anemia in chronic kidney disease: Secondary | ICD-10-CM | POA: Diagnosis not present

## 2020-10-30 DIAGNOSIS — Z992 Dependence on renal dialysis: Secondary | ICD-10-CM | POA: Diagnosis not present

## 2020-10-30 DIAGNOSIS — D689 Coagulation defect, unspecified: Secondary | ICD-10-CM | POA: Diagnosis not present

## 2020-10-30 DIAGNOSIS — N186 End stage renal disease: Secondary | ICD-10-CM | POA: Diagnosis not present

## 2020-11-01 DIAGNOSIS — D689 Coagulation defect, unspecified: Secondary | ICD-10-CM | POA: Diagnosis not present

## 2020-11-01 DIAGNOSIS — N186 End stage renal disease: Secondary | ICD-10-CM | POA: Diagnosis not present

## 2020-11-01 DIAGNOSIS — N2581 Secondary hyperparathyroidism of renal origin: Secondary | ICD-10-CM | POA: Diagnosis not present

## 2020-11-01 DIAGNOSIS — D631 Anemia in chronic kidney disease: Secondary | ICD-10-CM | POA: Diagnosis not present

## 2020-11-01 DIAGNOSIS — Z992 Dependence on renal dialysis: Secondary | ICD-10-CM | POA: Diagnosis not present

## 2020-11-04 DIAGNOSIS — N2581 Secondary hyperparathyroidism of renal origin: Secondary | ICD-10-CM | POA: Diagnosis not present

## 2020-11-04 DIAGNOSIS — D689 Coagulation defect, unspecified: Secondary | ICD-10-CM | POA: Diagnosis not present

## 2020-11-04 DIAGNOSIS — N186 End stage renal disease: Secondary | ICD-10-CM | POA: Diagnosis not present

## 2020-11-04 DIAGNOSIS — D631 Anemia in chronic kidney disease: Secondary | ICD-10-CM | POA: Diagnosis not present

## 2020-11-04 DIAGNOSIS — Z992 Dependence on renal dialysis: Secondary | ICD-10-CM | POA: Diagnosis not present

## 2020-11-06 DIAGNOSIS — N2581 Secondary hyperparathyroidism of renal origin: Secondary | ICD-10-CM | POA: Diagnosis not present

## 2020-11-06 DIAGNOSIS — N186 End stage renal disease: Secondary | ICD-10-CM | POA: Diagnosis not present

## 2020-11-06 DIAGNOSIS — Z992 Dependence on renal dialysis: Secondary | ICD-10-CM | POA: Diagnosis not present

## 2020-11-06 DIAGNOSIS — D689 Coagulation defect, unspecified: Secondary | ICD-10-CM | POA: Diagnosis not present

## 2020-11-06 DIAGNOSIS — D631 Anemia in chronic kidney disease: Secondary | ICD-10-CM | POA: Diagnosis not present

## 2020-11-08 DIAGNOSIS — N186 End stage renal disease: Secondary | ICD-10-CM | POA: Diagnosis not present

## 2020-11-08 DIAGNOSIS — Z992 Dependence on renal dialysis: Secondary | ICD-10-CM | POA: Diagnosis not present

## 2020-11-08 DIAGNOSIS — D689 Coagulation defect, unspecified: Secondary | ICD-10-CM | POA: Diagnosis not present

## 2020-11-08 DIAGNOSIS — D631 Anemia in chronic kidney disease: Secondary | ICD-10-CM | POA: Diagnosis not present

## 2020-11-08 DIAGNOSIS — N2581 Secondary hyperparathyroidism of renal origin: Secondary | ICD-10-CM | POA: Diagnosis not present

## 2020-11-11 DIAGNOSIS — N186 End stage renal disease: Secondary | ICD-10-CM | POA: Diagnosis not present

## 2020-11-11 DIAGNOSIS — N2581 Secondary hyperparathyroidism of renal origin: Secondary | ICD-10-CM | POA: Diagnosis not present

## 2020-11-11 DIAGNOSIS — Z992 Dependence on renal dialysis: Secondary | ICD-10-CM | POA: Diagnosis not present

## 2020-11-11 DIAGNOSIS — D689 Coagulation defect, unspecified: Secondary | ICD-10-CM | POA: Diagnosis not present

## 2020-11-11 DIAGNOSIS — D631 Anemia in chronic kidney disease: Secondary | ICD-10-CM | POA: Diagnosis not present

## 2020-11-13 DIAGNOSIS — Z992 Dependence on renal dialysis: Secondary | ICD-10-CM | POA: Diagnosis not present

## 2020-11-13 DIAGNOSIS — D631 Anemia in chronic kidney disease: Secondary | ICD-10-CM | POA: Diagnosis not present

## 2020-11-13 DIAGNOSIS — N186 End stage renal disease: Secondary | ICD-10-CM | POA: Diagnosis not present

## 2020-11-13 DIAGNOSIS — D689 Coagulation defect, unspecified: Secondary | ICD-10-CM | POA: Diagnosis not present

## 2020-11-13 DIAGNOSIS — N2581 Secondary hyperparathyroidism of renal origin: Secondary | ICD-10-CM | POA: Diagnosis not present

## 2020-11-15 DIAGNOSIS — N2581 Secondary hyperparathyroidism of renal origin: Secondary | ICD-10-CM | POA: Diagnosis not present

## 2020-11-15 DIAGNOSIS — D689 Coagulation defect, unspecified: Secondary | ICD-10-CM | POA: Diagnosis not present

## 2020-11-15 DIAGNOSIS — N186 End stage renal disease: Secondary | ICD-10-CM | POA: Diagnosis not present

## 2020-11-15 DIAGNOSIS — I129 Hypertensive chronic kidney disease with stage 1 through stage 4 chronic kidney disease, or unspecified chronic kidney disease: Secondary | ICD-10-CM | POA: Diagnosis not present

## 2020-11-15 DIAGNOSIS — D631 Anemia in chronic kidney disease: Secondary | ICD-10-CM | POA: Diagnosis not present

## 2020-11-15 DIAGNOSIS — Z992 Dependence on renal dialysis: Secondary | ICD-10-CM | POA: Diagnosis not present

## 2020-11-18 DIAGNOSIS — Z23 Encounter for immunization: Secondary | ICD-10-CM | POA: Diagnosis not present

## 2020-11-18 DIAGNOSIS — N186 End stage renal disease: Secondary | ICD-10-CM | POA: Diagnosis not present

## 2020-11-18 DIAGNOSIS — Z992 Dependence on renal dialysis: Secondary | ICD-10-CM | POA: Diagnosis not present

## 2020-11-18 DIAGNOSIS — D631 Anemia in chronic kidney disease: Secondary | ICD-10-CM | POA: Diagnosis not present

## 2020-11-18 DIAGNOSIS — D689 Coagulation defect, unspecified: Secondary | ICD-10-CM | POA: Diagnosis not present

## 2020-11-18 DIAGNOSIS — N2581 Secondary hyperparathyroidism of renal origin: Secondary | ICD-10-CM | POA: Diagnosis not present

## 2020-11-20 DIAGNOSIS — N2581 Secondary hyperparathyroidism of renal origin: Secondary | ICD-10-CM | POA: Diagnosis not present

## 2020-11-20 DIAGNOSIS — Z23 Encounter for immunization: Secondary | ICD-10-CM | POA: Diagnosis not present

## 2020-11-20 DIAGNOSIS — D631 Anemia in chronic kidney disease: Secondary | ICD-10-CM | POA: Diagnosis not present

## 2020-11-20 DIAGNOSIS — N186 End stage renal disease: Secondary | ICD-10-CM | POA: Diagnosis not present

## 2020-11-20 DIAGNOSIS — Z992 Dependence on renal dialysis: Secondary | ICD-10-CM | POA: Diagnosis not present

## 2020-11-20 DIAGNOSIS — D689 Coagulation defect, unspecified: Secondary | ICD-10-CM | POA: Diagnosis not present

## 2020-11-22 ENCOUNTER — Other Ambulatory Visit: Payer: Self-pay

## 2020-11-22 DIAGNOSIS — Z992 Dependence on renal dialysis: Secondary | ICD-10-CM | POA: Diagnosis not present

## 2020-11-22 DIAGNOSIS — D689 Coagulation defect, unspecified: Secondary | ICD-10-CM | POA: Diagnosis not present

## 2020-11-22 DIAGNOSIS — N186 End stage renal disease: Secondary | ICD-10-CM | POA: Diagnosis not present

## 2020-11-22 DIAGNOSIS — Z23 Encounter for immunization: Secondary | ICD-10-CM | POA: Diagnosis not present

## 2020-11-22 DIAGNOSIS — N2581 Secondary hyperparathyroidism of renal origin: Secondary | ICD-10-CM | POA: Diagnosis not present

## 2020-11-22 DIAGNOSIS — D631 Anemia in chronic kidney disease: Secondary | ICD-10-CM | POA: Diagnosis not present

## 2020-11-22 MED FILL — Clonidine HCl Tab 0.1 MG: ORAL | 30 days supply | Qty: 60 | Fill #0 | Status: AC

## 2020-11-25 DIAGNOSIS — N2581 Secondary hyperparathyroidism of renal origin: Secondary | ICD-10-CM | POA: Diagnosis not present

## 2020-11-25 DIAGNOSIS — D631 Anemia in chronic kidney disease: Secondary | ICD-10-CM | POA: Diagnosis not present

## 2020-11-25 DIAGNOSIS — Z23 Encounter for immunization: Secondary | ICD-10-CM | POA: Diagnosis not present

## 2020-11-25 DIAGNOSIS — D689 Coagulation defect, unspecified: Secondary | ICD-10-CM | POA: Diagnosis not present

## 2020-11-25 DIAGNOSIS — Z992 Dependence on renal dialysis: Secondary | ICD-10-CM | POA: Diagnosis not present

## 2020-11-25 DIAGNOSIS — N186 End stage renal disease: Secondary | ICD-10-CM | POA: Diagnosis not present

## 2020-11-27 DIAGNOSIS — N2581 Secondary hyperparathyroidism of renal origin: Secondary | ICD-10-CM | POA: Diagnosis not present

## 2020-11-27 DIAGNOSIS — N186 End stage renal disease: Secondary | ICD-10-CM | POA: Diagnosis not present

## 2020-11-27 DIAGNOSIS — Z23 Encounter for immunization: Secondary | ICD-10-CM | POA: Diagnosis not present

## 2020-11-27 DIAGNOSIS — Z992 Dependence on renal dialysis: Secondary | ICD-10-CM | POA: Diagnosis not present

## 2020-11-27 DIAGNOSIS — D631 Anemia in chronic kidney disease: Secondary | ICD-10-CM | POA: Diagnosis not present

## 2020-11-27 DIAGNOSIS — D689 Coagulation defect, unspecified: Secondary | ICD-10-CM | POA: Diagnosis not present

## 2020-11-29 DIAGNOSIS — Z992 Dependence on renal dialysis: Secondary | ICD-10-CM | POA: Diagnosis not present

## 2020-11-29 DIAGNOSIS — D631 Anemia in chronic kidney disease: Secondary | ICD-10-CM | POA: Diagnosis not present

## 2020-11-29 DIAGNOSIS — N2581 Secondary hyperparathyroidism of renal origin: Secondary | ICD-10-CM | POA: Diagnosis not present

## 2020-11-29 DIAGNOSIS — D689 Coagulation defect, unspecified: Secondary | ICD-10-CM | POA: Diagnosis not present

## 2020-11-29 DIAGNOSIS — N186 End stage renal disease: Secondary | ICD-10-CM | POA: Diagnosis not present

## 2020-11-29 DIAGNOSIS — Z23 Encounter for immunization: Secondary | ICD-10-CM | POA: Diagnosis not present

## 2020-12-02 DIAGNOSIS — Z23 Encounter for immunization: Secondary | ICD-10-CM | POA: Diagnosis not present

## 2020-12-02 DIAGNOSIS — N2581 Secondary hyperparathyroidism of renal origin: Secondary | ICD-10-CM | POA: Diagnosis not present

## 2020-12-02 DIAGNOSIS — Z992 Dependence on renal dialysis: Secondary | ICD-10-CM | POA: Diagnosis not present

## 2020-12-02 DIAGNOSIS — D689 Coagulation defect, unspecified: Secondary | ICD-10-CM | POA: Diagnosis not present

## 2020-12-02 DIAGNOSIS — N186 End stage renal disease: Secondary | ICD-10-CM | POA: Diagnosis not present

## 2020-12-02 DIAGNOSIS — D631 Anemia in chronic kidney disease: Secondary | ICD-10-CM | POA: Diagnosis not present

## 2020-12-04 DIAGNOSIS — N186 End stage renal disease: Secondary | ICD-10-CM | POA: Diagnosis not present

## 2020-12-04 DIAGNOSIS — D689 Coagulation defect, unspecified: Secondary | ICD-10-CM | POA: Diagnosis not present

## 2020-12-04 DIAGNOSIS — D631 Anemia in chronic kidney disease: Secondary | ICD-10-CM | POA: Diagnosis not present

## 2020-12-04 DIAGNOSIS — Z23 Encounter for immunization: Secondary | ICD-10-CM | POA: Diagnosis not present

## 2020-12-04 DIAGNOSIS — Z992 Dependence on renal dialysis: Secondary | ICD-10-CM | POA: Diagnosis not present

## 2020-12-04 DIAGNOSIS — N2581 Secondary hyperparathyroidism of renal origin: Secondary | ICD-10-CM | POA: Diagnosis not present

## 2020-12-06 DIAGNOSIS — N186 End stage renal disease: Secondary | ICD-10-CM | POA: Diagnosis not present

## 2020-12-06 DIAGNOSIS — Z992 Dependence on renal dialysis: Secondary | ICD-10-CM | POA: Diagnosis not present

## 2020-12-06 DIAGNOSIS — D689 Coagulation defect, unspecified: Secondary | ICD-10-CM | POA: Diagnosis not present

## 2020-12-06 DIAGNOSIS — Z23 Encounter for immunization: Secondary | ICD-10-CM | POA: Diagnosis not present

## 2020-12-06 DIAGNOSIS — D631 Anemia in chronic kidney disease: Secondary | ICD-10-CM | POA: Diagnosis not present

## 2020-12-06 DIAGNOSIS — N2581 Secondary hyperparathyroidism of renal origin: Secondary | ICD-10-CM | POA: Diagnosis not present

## 2020-12-09 DIAGNOSIS — D631 Anemia in chronic kidney disease: Secondary | ICD-10-CM | POA: Diagnosis not present

## 2020-12-09 DIAGNOSIS — Z992 Dependence on renal dialysis: Secondary | ICD-10-CM | POA: Diagnosis not present

## 2020-12-09 DIAGNOSIS — Z23 Encounter for immunization: Secondary | ICD-10-CM | POA: Diagnosis not present

## 2020-12-09 DIAGNOSIS — D689 Coagulation defect, unspecified: Secondary | ICD-10-CM | POA: Diagnosis not present

## 2020-12-09 DIAGNOSIS — N2581 Secondary hyperparathyroidism of renal origin: Secondary | ICD-10-CM | POA: Diagnosis not present

## 2020-12-09 DIAGNOSIS — N186 End stage renal disease: Secondary | ICD-10-CM | POA: Diagnosis not present

## 2020-12-11 DIAGNOSIS — D631 Anemia in chronic kidney disease: Secondary | ICD-10-CM | POA: Diagnosis not present

## 2020-12-11 DIAGNOSIS — N2581 Secondary hyperparathyroidism of renal origin: Secondary | ICD-10-CM | POA: Diagnosis not present

## 2020-12-11 DIAGNOSIS — Z992 Dependence on renal dialysis: Secondary | ICD-10-CM | POA: Diagnosis not present

## 2020-12-11 DIAGNOSIS — D689 Coagulation defect, unspecified: Secondary | ICD-10-CM | POA: Diagnosis not present

## 2020-12-11 DIAGNOSIS — N186 End stage renal disease: Secondary | ICD-10-CM | POA: Diagnosis not present

## 2020-12-11 DIAGNOSIS — Z23 Encounter for immunization: Secondary | ICD-10-CM | POA: Diagnosis not present

## 2020-12-13 DIAGNOSIS — Z23 Encounter for immunization: Secondary | ICD-10-CM | POA: Diagnosis not present

## 2020-12-13 DIAGNOSIS — N186 End stage renal disease: Secondary | ICD-10-CM | POA: Diagnosis not present

## 2020-12-13 DIAGNOSIS — N2581 Secondary hyperparathyroidism of renal origin: Secondary | ICD-10-CM | POA: Diagnosis not present

## 2020-12-13 DIAGNOSIS — D631 Anemia in chronic kidney disease: Secondary | ICD-10-CM | POA: Diagnosis not present

## 2020-12-13 DIAGNOSIS — D689 Coagulation defect, unspecified: Secondary | ICD-10-CM | POA: Diagnosis not present

## 2020-12-13 DIAGNOSIS — Z992 Dependence on renal dialysis: Secondary | ICD-10-CM | POA: Diagnosis not present

## 2020-12-16 DIAGNOSIS — Z992 Dependence on renal dialysis: Secondary | ICD-10-CM | POA: Diagnosis not present

## 2020-12-16 DIAGNOSIS — N2581 Secondary hyperparathyroidism of renal origin: Secondary | ICD-10-CM | POA: Diagnosis not present

## 2020-12-16 DIAGNOSIS — D631 Anemia in chronic kidney disease: Secondary | ICD-10-CM | POA: Diagnosis not present

## 2020-12-16 DIAGNOSIS — I129 Hypertensive chronic kidney disease with stage 1 through stage 4 chronic kidney disease, or unspecified chronic kidney disease: Secondary | ICD-10-CM | POA: Diagnosis not present

## 2020-12-16 DIAGNOSIS — Z23 Encounter for immunization: Secondary | ICD-10-CM | POA: Diagnosis not present

## 2020-12-16 DIAGNOSIS — D689 Coagulation defect, unspecified: Secondary | ICD-10-CM | POA: Diagnosis not present

## 2020-12-16 DIAGNOSIS — N186 End stage renal disease: Secondary | ICD-10-CM | POA: Diagnosis not present

## 2020-12-18 DIAGNOSIS — D631 Anemia in chronic kidney disease: Secondary | ICD-10-CM | POA: Diagnosis not present

## 2020-12-18 DIAGNOSIS — Z992 Dependence on renal dialysis: Secondary | ICD-10-CM | POA: Diagnosis not present

## 2020-12-18 DIAGNOSIS — D689 Coagulation defect, unspecified: Secondary | ICD-10-CM | POA: Diagnosis not present

## 2020-12-18 DIAGNOSIS — N2581 Secondary hyperparathyroidism of renal origin: Secondary | ICD-10-CM | POA: Diagnosis not present

## 2020-12-18 DIAGNOSIS — N186 End stage renal disease: Secondary | ICD-10-CM | POA: Diagnosis not present

## 2020-12-20 DIAGNOSIS — D689 Coagulation defect, unspecified: Secondary | ICD-10-CM | POA: Diagnosis not present

## 2020-12-20 DIAGNOSIS — Z992 Dependence on renal dialysis: Secondary | ICD-10-CM | POA: Diagnosis not present

## 2020-12-20 DIAGNOSIS — N2581 Secondary hyperparathyroidism of renal origin: Secondary | ICD-10-CM | POA: Diagnosis not present

## 2020-12-20 DIAGNOSIS — N186 End stage renal disease: Secondary | ICD-10-CM | POA: Diagnosis not present

## 2020-12-20 DIAGNOSIS — D631 Anemia in chronic kidney disease: Secondary | ICD-10-CM | POA: Diagnosis not present

## 2020-12-23 ENCOUNTER — Other Ambulatory Visit: Payer: Self-pay

## 2020-12-23 ENCOUNTER — Ambulatory Visit: Payer: Medicare Other | Attending: Internal Medicine | Admitting: Internal Medicine

## 2020-12-23 ENCOUNTER — Encounter: Payer: Self-pay | Admitting: Internal Medicine

## 2020-12-23 VITALS — BP 93/65 | HR 95 | Resp 16 | Wt 169.0 lb

## 2020-12-23 DIAGNOSIS — I953 Hypotension of hemodialysis: Secondary | ICD-10-CM

## 2020-12-23 DIAGNOSIS — D631 Anemia in chronic kidney disease: Secondary | ICD-10-CM | POA: Diagnosis not present

## 2020-12-23 DIAGNOSIS — D689 Coagulation defect, unspecified: Secondary | ICD-10-CM | POA: Diagnosis not present

## 2020-12-23 DIAGNOSIS — F320 Major depressive disorder, single episode, mild: Secondary | ICD-10-CM

## 2020-12-23 DIAGNOSIS — Z23 Encounter for immunization: Secondary | ICD-10-CM | POA: Diagnosis not present

## 2020-12-23 DIAGNOSIS — N2581 Secondary hyperparathyroidism of renal origin: Secondary | ICD-10-CM | POA: Diagnosis not present

## 2020-12-23 DIAGNOSIS — N186 End stage renal disease: Secondary | ICD-10-CM | POA: Diagnosis not present

## 2020-12-23 DIAGNOSIS — Z992 Dependence on renal dialysis: Secondary | ICD-10-CM | POA: Diagnosis not present

## 2020-12-23 NOTE — Progress Notes (Signed)
Patient ID: Samuel Burns, male    DOB: 1985-09-28  MRN: [id number]  CC: Referral   Subjective: Saahil Herbster is a 35 y.o. male who presents for chronic ds management. His concerns today include:  ESRD on HD, hypertensive renal disease, anemia of chronic disease, ED  Received flu shot 3-4 wks ago at HD center. Five days later he begin having cough, congestion, body aches, sore throat, sneezing, and rhinorrhea. No fever. Did COVID test 2 wks ago and it was negative. Still a little stuffy and occasional rhinorrhea.  No one else was sick.  C/o feeling depressed ever since being on HD "It gets tuff sometimes."  Miss not being able to work.  His dad passed of kidney failure last yr several mths before he was dx.  Still deal with loss of dead. Gets angry sometimes because after his dialysis, he wants to go home and sleep but has to stay up and do things with the kids or help around the house. Gets along well with his girlfriend.  Has 2 kids in the house.  No SI or HI.  No auditory or visual hallucinations.   HTN/ESRD:  reports still being on Clonidine, Metoprolol, Norvasc, and Cozaar.  HD Mon/Wed/Fri.  BP low on HD days. No dizziness at this time.  ED: doing well on Viagra  HM: Reports having had 2 COVID vaccine. Due for Prevnar 20.   Patient Active Problem List   Diagnosis Date Noted   Erectile dysfunction 07/30/2020   Pericardial effusion 12/15/2019   Hypertensive renal disease 12/15/2019   Thrombotic microangiopathy (HCC)    Anemia of chronic disease    ESRD (end stage renal disease) on dialysis [facility])    Acute renal failure (ARF) ([place]) 11/19/2019   Demand ischemia (HCC)    Hypertensive emergency    Metabolic acidosis      Current Outpatient Medications on File Prior to Visit  Medication Sig Dispense Refill   acetaminophen (TYLENOL) 500 MG tablet Take 1,000 mg by mouth every 6 (six) hours as needed for moderate pain.     amLODipine (NORVASC) 10 MG tablet TAKE 1 TABLET BY MOUTH  EVERY DAY 30 tablet 6   cloNIDine (CATAPRES) 0.1 MG tablet TAKE 1 TABLET (0.1 MG TOTAL) BY MOUTH 2 (TWO) TIMES DAILY. 60 tablet 3   ELDERBERRY PO Take 1 capsule by mouth daily.     ferric citrate (AURYXIA) 1 GM 210 MG(Fe) tablet Take 420 mg by mouth See admin instructions. Take 420 mg 3 times daily with meals and each snack     lidocaine-prilocaine (EMLA) cream Apply 1 application topically as needed (access fistula).     losartan (COZAAR) 25 MG tablet TAKE 1 TABLET BY MOUTH EVERY DAY 30 tablet 6   Metoprolol Tartrate 75 MG TABS TAKE 1 TABLET BY MOUTH TWICE DAILY. 180 tablet 3   Naphazoline HCl (CLEAR EYES OP) Place 1 drop into both eyes daily as needed (irritation). (Patient not taking: Reported on 05/23/2020)     sevelamer carbonate (RENVELA) 800 MG tablet Take 1 tablet (800 mg total) by mouth 3 (three) times daily with meals. (Patient not taking: No sig reported) 90 tablet 3   sildenafil (VIAGRA) 50 MG tablet 1 tab PO 1/2 hr prior to sex PRN.  Limit use to 1 tab/24 hr 10 tablet 2   No current facility-administered medications on file prior to visit.    No Known Allergies  Social History   Socioeconomic History   Marital status: Significant Other  Spouse name: Not on file   Number of children: Not on file   Years of education: Not on file   Highest education level: Not on file  Occupational History   Not on file  Tobacco Use   Smoking status: Never   Smokeless tobacco: Never  Substance and Sexual Activity   Alcohol use: No   Drug use: Yes    Types: Marijuana   Sexual activity: Not on file  Other Topics Concern   Not on file  Social History Narrative   Not on file   Social Determinants of Health   Financial Resource Strain: Not on file  Food Insecurity: Not on file  Transportation Needs: Not on file  Physical Activity: Not on file  Stress: Not on file  Social Connections: Not on file  Intimate Partner Violence: Not on file    Family History  Problem Relation Age of  Onset   Hypertension Maternal Grandmother     Past Surgical History:  Procedure Laterality Date   AV FISTULA PLACEMENT Right 11/24/2019   Procedure: Creation of RIGHT ARM Brachial/Cephalic ARTERIOVENOUS (AV) FISTULA.;  Surgeon: [name], MD;  Location: [place];  Service: Vascular;  Laterality: Right;   IR DIALY SHUNT INTRO [place] W/IMG RIGHT Right 04/30/2020   IR FLUORO GUIDE CV LINE RIGHT  11/20/2019   IR REMOVAL TUN CV CATH W/O FL  06/04/2020   IR US GUIDE VASC ACCESS RIGHT  11/20/2019   IR US GUIDE VASC ACCESS RIGHT  04/30/2020    ROS: Review of Systems Negative except as stated above  PHYSICAL EXAM: BP 93/65   Pulse 95   Resp 16   Wt 169 lb (76.7 kg)   SpO2 98%   BMI 26.27 kg/m   Physical Exam  General appearance - alert, well appearing, and in no distress Mental status - normal mood, behavior, speech, dress, motor activity, and thought processes Chest - clear to auscultation, no wheezes, rales or rhonchi, symmetric air entry Heart - normal rate, regular rhythm, normal S1, S2, no murmurs, rubs, clicks or gallops Extremities - graft for HD on RUE.  No LE edema   CMP Latest Ref Rng & Units 04/30/2020 11/27/2019 11/26/2019  Glucose 70 - 99 mg/dL 83 88 85  BUN 6 - 20 mg/dL 44(H) 50(H) 41(H)  Creatinine 0.61 - 1.24 mg/dL 8.12(H) 11.88(H) 9.71(H)  Sodium 135 - 145 mmol/L 138 134(L) 136  Potassium 3.5 - 5.1 mmol/L 4.1 3.9 4.0  Chloride 98 - 111 mmol/L 95(L) 96(L) 97(L)  CO2 22 - 32 mmol/L 31 23 24   Calcium 8.9 - 10.3 mg/dL 9.8 8.9 8.8(L)  Total Protein 6.5 - 8.1 g/dL - - -  Total Bilirubin 0.3 - 1.2 mg/dL - - -  Alkaline Phos 38 - 126 U/L - - -  AST 15 - 41 U/L - - -  ALT 0 - 44 U/L - - -   Lipid Panel     Component Value Date/Time   CHOL 186 11/20/2019 0428   CHOL 157 04/08/2017 1520   TRIG 207 (H) 11/20/2019 0428   HDL 30 (L) 11/20/2019 0428   HDL 39 (L) 04/08/2017 1520   CHOLHDL 6.2 11/20/2019 0428   VLDL 41 (H) 11/20/2019 0428    LDLCALC 115 (H) 11/20/2019 0428   LDLCALC 95 04/08/2017 1520    CBC    Component Value Date/Time   WBC 5.1 04/30/2020 0805   RBC 3.80 (L) 04/30/2020 0805   HGB 12.4 (L) 04/30/2020 0805  HGB 10.4 (L) 12/15/2019 1546   HCT 38.1 (L) 04/30/2020 0805   HCT 32.3 (L) 12/15/2019 1546   PLT 193 04/30/2020 0805   PLT 249 12/15/2019 1546   MCV 100.3 (H) 04/30/2020 0805   MCV 92 12/15/2019 1546   MCH 32.6 04/30/2020 0805   MCHC 32.5 04/30/2020 0805   RDW 16.3 (H) 04/30/2020 0805   RDW 13.0 12/15/2019 1546   Depression screen PHQ 2/9 12/23/2020 05/23/2020 12/15/2019  Decreased Interest 2 0 0  Down, Depressed, Hopeless 1 2 0  PHQ - 2 Score 3 2 0  Altered sleeping 0 0 -  Tired, decreased energy 1 1 -  Change in appetite 0 0 -  Feeling bad or failure about yourself  2 2 -  Trouble concentrating 1 0 -  Moving slowly or fidgety/restless 0 0 -  Suicidal thoughts 0 0 -  PHQ-9 Score 7 5 -     ASSESSMENT AND PLAN: 1. Mild major depression ([place]) -pt feels he would benefit from some counseling.  Referral submit and message sent to LCSW - Ambulatory referral to Psychiatry  2. Hemodialysis-associated hypotension Pt's BP low today but he is asymptomatic.  Likely due to having had HD today.   3. ESRD on hemodialysis ([place]) Continue going to HD M/W/F  4. Need for vaccination against Streptococcus pneumoniae Prevnar 20 given today Informed pt that his respiratory symptoms were likely due to the common cold.  Doubt that it was caused by th flu shot as the flu shot was given 5-6 days prior to him developing symptoms.   Patient was given the opportunity to ask questions.  Patient verbalized understanding of the plan and was able to repeat key elements of the plan.   Orders Placed This Encounter  Procedures   Pneumococcal conjugate vaccine 20-valent   Ambulatory referral to Psychiatry     Requested Prescriptions    No prescriptions requested or ordered in this encounter    Return in  about 4 months (around 04/22/2021).  Karle Plumber, MD, FACP

## 2020-12-25 DIAGNOSIS — Z992 Dependence on renal dialysis: Secondary | ICD-10-CM | POA: Diagnosis not present

## 2020-12-25 DIAGNOSIS — N2581 Secondary hyperparathyroidism of renal origin: Secondary | ICD-10-CM | POA: Diagnosis not present

## 2020-12-25 DIAGNOSIS — N186 End stage renal disease: Secondary | ICD-10-CM | POA: Diagnosis not present

## 2020-12-25 DIAGNOSIS — D689 Coagulation defect, unspecified: Secondary | ICD-10-CM | POA: Diagnosis not present

## 2020-12-25 DIAGNOSIS — D631 Anemia in chronic kidney disease: Secondary | ICD-10-CM | POA: Diagnosis not present

## 2020-12-27 DIAGNOSIS — N186 End stage renal disease: Secondary | ICD-10-CM | POA: Diagnosis not present

## 2020-12-27 DIAGNOSIS — N2581 Secondary hyperparathyroidism of renal origin: Secondary | ICD-10-CM | POA: Diagnosis not present

## 2020-12-27 DIAGNOSIS — D689 Coagulation defect, unspecified: Secondary | ICD-10-CM | POA: Diagnosis not present

## 2020-12-27 DIAGNOSIS — Z992 Dependence on renal dialysis: Secondary | ICD-10-CM | POA: Diagnosis not present

## 2020-12-27 DIAGNOSIS — D631 Anemia in chronic kidney disease: Secondary | ICD-10-CM | POA: Diagnosis not present

## 2020-12-30 DIAGNOSIS — N2581 Secondary hyperparathyroidism of renal origin: Secondary | ICD-10-CM | POA: Diagnosis not present

## 2020-12-30 DIAGNOSIS — D631 Anemia in chronic kidney disease: Secondary | ICD-10-CM | POA: Diagnosis not present

## 2020-12-30 DIAGNOSIS — N186 End stage renal disease: Secondary | ICD-10-CM | POA: Diagnosis not present

## 2020-12-30 DIAGNOSIS — Z992 Dependence on renal dialysis: Secondary | ICD-10-CM | POA: Diagnosis not present

## 2020-12-30 DIAGNOSIS — D689 Coagulation defect, unspecified: Secondary | ICD-10-CM | POA: Diagnosis not present

## 2021-01-01 DIAGNOSIS — D689 Coagulation defect, unspecified: Secondary | ICD-10-CM | POA: Diagnosis not present

## 2021-01-01 DIAGNOSIS — N186 End stage renal disease: Secondary | ICD-10-CM | POA: Diagnosis not present

## 2021-01-01 DIAGNOSIS — D631 Anemia in chronic kidney disease: Secondary | ICD-10-CM | POA: Diagnosis not present

## 2021-01-01 DIAGNOSIS — Z992 Dependence on renal dialysis: Secondary | ICD-10-CM | POA: Diagnosis not present

## 2021-01-01 DIAGNOSIS — N2581 Secondary hyperparathyroidism of renal origin: Secondary | ICD-10-CM | POA: Diagnosis not present

## 2021-01-03 DIAGNOSIS — N2581 Secondary hyperparathyroidism of renal origin: Secondary | ICD-10-CM | POA: Diagnosis not present

## 2021-01-03 DIAGNOSIS — Z992 Dependence on renal dialysis: Secondary | ICD-10-CM | POA: Diagnosis not present

## 2021-01-03 DIAGNOSIS — D631 Anemia in chronic kidney disease: Secondary | ICD-10-CM | POA: Diagnosis not present

## 2021-01-03 DIAGNOSIS — N186 End stage renal disease: Secondary | ICD-10-CM | POA: Diagnosis not present

## 2021-01-03 DIAGNOSIS — D689 Coagulation defect, unspecified: Secondary | ICD-10-CM | POA: Diagnosis not present

## 2021-01-06 DIAGNOSIS — N2581 Secondary hyperparathyroidism of renal origin: Secondary | ICD-10-CM | POA: Diagnosis not present

## 2021-01-06 DIAGNOSIS — N186 End stage renal disease: Secondary | ICD-10-CM | POA: Diagnosis not present

## 2021-01-06 DIAGNOSIS — D631 Anemia in chronic kidney disease: Secondary | ICD-10-CM | POA: Diagnosis not present

## 2021-01-06 DIAGNOSIS — Z992 Dependence on renal dialysis: Secondary | ICD-10-CM | POA: Diagnosis not present

## 2021-01-06 DIAGNOSIS — D689 Coagulation defect, unspecified: Secondary | ICD-10-CM | POA: Diagnosis not present

## 2021-01-08 DIAGNOSIS — N2581 Secondary hyperparathyroidism of renal origin: Secondary | ICD-10-CM | POA: Diagnosis not present

## 2021-01-08 DIAGNOSIS — Z992 Dependence on renal dialysis: Secondary | ICD-10-CM | POA: Diagnosis not present

## 2021-01-08 DIAGNOSIS — D631 Anemia in chronic kidney disease: Secondary | ICD-10-CM | POA: Diagnosis not present

## 2021-01-08 DIAGNOSIS — N186 End stage renal disease: Secondary | ICD-10-CM | POA: Diagnosis not present

## 2021-01-08 DIAGNOSIS — D689 Coagulation defect, unspecified: Secondary | ICD-10-CM | POA: Diagnosis not present

## 2021-01-11 DIAGNOSIS — D631 Anemia in chronic kidney disease: Secondary | ICD-10-CM | POA: Diagnosis not present

## 2021-01-11 DIAGNOSIS — N186 End stage renal disease: Secondary | ICD-10-CM | POA: Diagnosis not present

## 2021-01-11 DIAGNOSIS — N2581 Secondary hyperparathyroidism of renal origin: Secondary | ICD-10-CM | POA: Diagnosis not present

## 2021-01-11 DIAGNOSIS — D689 Coagulation defect, unspecified: Secondary | ICD-10-CM | POA: Diagnosis not present

## 2021-01-11 DIAGNOSIS — Z992 Dependence on renal dialysis: Secondary | ICD-10-CM | POA: Diagnosis not present

## 2021-01-13 DIAGNOSIS — N186 End stage renal disease: Secondary | ICD-10-CM | POA: Diagnosis not present

## 2021-01-13 DIAGNOSIS — N2581 Secondary hyperparathyroidism of renal origin: Secondary | ICD-10-CM | POA: Diagnosis not present

## 2021-01-13 DIAGNOSIS — D631 Anemia in chronic kidney disease: Secondary | ICD-10-CM | POA: Diagnosis not present

## 2021-01-13 DIAGNOSIS — Z992 Dependence on renal dialysis: Secondary | ICD-10-CM | POA: Diagnosis not present

## 2021-01-13 DIAGNOSIS — D689 Coagulation defect, unspecified: Secondary | ICD-10-CM | POA: Diagnosis not present

## 2021-01-15 DIAGNOSIS — N186 End stage renal disease: Secondary | ICD-10-CM | POA: Diagnosis not present

## 2021-01-15 DIAGNOSIS — Z992 Dependence on renal dialysis: Secondary | ICD-10-CM | POA: Diagnosis not present

## 2021-01-15 DIAGNOSIS — I129 Hypertensive chronic kidney disease with stage 1 through stage 4 chronic kidney disease, or unspecified chronic kidney disease: Secondary | ICD-10-CM | POA: Diagnosis not present

## 2021-01-15 DIAGNOSIS — N2581 Secondary hyperparathyroidism of renal origin: Secondary | ICD-10-CM | POA: Diagnosis not present

## 2021-01-15 DIAGNOSIS — D689 Coagulation defect, unspecified: Secondary | ICD-10-CM | POA: Diagnosis not present

## 2021-01-15 DIAGNOSIS — D631 Anemia in chronic kidney disease: Secondary | ICD-10-CM | POA: Diagnosis not present

## 2021-01-17 DIAGNOSIS — Z992 Dependence on renal dialysis: Secondary | ICD-10-CM | POA: Diagnosis not present

## 2021-01-17 DIAGNOSIS — N186 End stage renal disease: Secondary | ICD-10-CM | POA: Diagnosis not present

## 2021-01-17 DIAGNOSIS — N2581 Secondary hyperparathyroidism of renal origin: Secondary | ICD-10-CM | POA: Diagnosis not present

## 2021-01-17 DIAGNOSIS — D631 Anemia in chronic kidney disease: Secondary | ICD-10-CM | POA: Diagnosis not present

## 2021-01-17 DIAGNOSIS — D689 Coagulation defect, unspecified: Secondary | ICD-10-CM | POA: Diagnosis not present

## 2021-01-20 DIAGNOSIS — D631 Anemia in chronic kidney disease: Secondary | ICD-10-CM | POA: Diagnosis not present

## 2021-01-20 DIAGNOSIS — Z992 Dependence on renal dialysis: Secondary | ICD-10-CM | POA: Diagnosis not present

## 2021-01-20 DIAGNOSIS — D689 Coagulation defect, unspecified: Secondary | ICD-10-CM | POA: Diagnosis not present

## 2021-01-20 DIAGNOSIS — N2581 Secondary hyperparathyroidism of renal origin: Secondary | ICD-10-CM | POA: Diagnosis not present

## 2021-01-20 DIAGNOSIS — N186 End stage renal disease: Secondary | ICD-10-CM | POA: Diagnosis not present

## 2021-01-22 DIAGNOSIS — N186 End stage renal disease: Secondary | ICD-10-CM | POA: Diagnosis not present

## 2021-01-22 DIAGNOSIS — D631 Anemia in chronic kidney disease: Secondary | ICD-10-CM | POA: Diagnosis not present

## 2021-01-22 DIAGNOSIS — N2581 Secondary hyperparathyroidism of renal origin: Secondary | ICD-10-CM | POA: Diagnosis not present

## 2021-01-22 DIAGNOSIS — Z992 Dependence on renal dialysis: Secondary | ICD-10-CM | POA: Diagnosis not present

## 2021-01-22 DIAGNOSIS — D689 Coagulation defect, unspecified: Secondary | ICD-10-CM | POA: Diagnosis not present

## 2021-01-24 DIAGNOSIS — Z992 Dependence on renal dialysis: Secondary | ICD-10-CM | POA: Diagnosis not present

## 2021-01-24 DIAGNOSIS — D689 Coagulation defect, unspecified: Secondary | ICD-10-CM | POA: Diagnosis not present

## 2021-01-24 DIAGNOSIS — D631 Anemia in chronic kidney disease: Secondary | ICD-10-CM | POA: Diagnosis not present

## 2021-01-24 DIAGNOSIS — N186 End stage renal disease: Secondary | ICD-10-CM | POA: Diagnosis not present

## 2021-01-24 DIAGNOSIS — N2581 Secondary hyperparathyroidism of renal origin: Secondary | ICD-10-CM | POA: Diagnosis not present

## 2021-01-27 DIAGNOSIS — Z992 Dependence on renal dialysis: Secondary | ICD-10-CM | POA: Diagnosis not present

## 2021-01-27 DIAGNOSIS — N186 End stage renal disease: Secondary | ICD-10-CM | POA: Diagnosis not present

## 2021-01-27 DIAGNOSIS — N2581 Secondary hyperparathyroidism of renal origin: Secondary | ICD-10-CM | POA: Diagnosis not present

## 2021-01-27 DIAGNOSIS — D631 Anemia in chronic kidney disease: Secondary | ICD-10-CM | POA: Diagnosis not present

## 2021-01-27 DIAGNOSIS — D689 Coagulation defect, unspecified: Secondary | ICD-10-CM | POA: Diagnosis not present

## 2021-01-29 ENCOUNTER — Ambulatory Visit (HOSPITAL_COMMUNITY)
Admission: RE | Admit: 2021-01-29 | Discharge: 2021-01-29 | Disposition: A | Discharge status: Home / Self Care | Payer: Medicaid Other | Attending: Psychiatry | Admitting: Psychiatry

## 2021-01-29 DIAGNOSIS — N186 End stage renal disease: Secondary | ICD-10-CM | POA: Diagnosis not present

## 2021-01-29 DIAGNOSIS — D631 Anemia in chronic kidney disease: Secondary | ICD-10-CM | POA: Diagnosis not present

## 2021-01-29 DIAGNOSIS — D689 Coagulation defect, unspecified: Secondary | ICD-10-CM | POA: Diagnosis not present

## 2021-01-29 DIAGNOSIS — N2581 Secondary hyperparathyroidism of renal origin: Secondary | ICD-10-CM | POA: Diagnosis not present

## 2021-01-29 DIAGNOSIS — Z992 Dependence on renal dialysis: Secondary | ICD-10-CM | POA: Diagnosis not present

## 2021-01-29 NOTE — BH Assessment (Addendum)
Comprehensive Clinical Assessment (CCA) Note  01/29/2021 Samuel Burns [id number]  Disposition: TTS completed. Per Jinny Blossom., NP, patient does not meet criteria for inpatient treatment and has been psych cleared. He is recommended to follow up with outpatient resources: therapy, med management, Partial/IOP, support groups, etc.   Flowsheet Row IR 60MIN MCIR1/MCIR2/WLIR1 from 04/30/2020 in [facility] No Risk       The patient demonstrates the following risk factors for suicide: Chronic risk factors for suicide include: psychiatric disorder of Depressive Disorder, Severe; Anxiety Disorder; Adjustment Disorder . Acute risk factors for suicide include: family or marital conflict and social withdrawal/isolation. Protective factors for this patient include: positive social support, responsibility to others (children, family), hope for the future, and life satisfaction. Considering these factors, the overall suicide risk at this point appears to be high. Patient is appropriate for outpatient follow up.   Chief Complaint:  Chief Complaint  Patient presents with   Psychiatric Evaluation   Visit Diagnosis: Depressive Disorder, Severe; Anxiety Disorder; Adjustment Disorder   Samuel Burns is a 35 y/o male that presents to [facility] as a walk-in. He presents with increased depressive symptoms and anxiety. Current triggers and stressors: 2019-09-06 his father passed away from Kidney Disease; October 2021 he was dx's with Kidney disease and started on dyalysis (participates in treatment 3 days a week), Nov 09, 2018 grandmother passed away during the [place] pandemic. Meanwhile, patient has a girlfiend and 2 children (1 bio child). He is currently on disability and unable to work. Current depressive symptoms include feeling of guilt, irritability, wanting to be alone, and anxiety. Also, a hx of anger/irritability that has recently been  exacerbated toward his girlfriend. Patient feels that she doesn't understand the severity of his medical issues and how it effects his emotional/physicall well being. His  sleep routine is poor (5-8 hrs per night) with difficulty going to sleep; staying sleep. Appetite is good. However, he reports significant weight loss due to medical issues over the past year. He has tried outpatient therapy when he was in [place]for support after his mothers passing when he was 37 y/o (approximately 2 appts). Also, he reports participating in a few therapy sesssions during the summer (2021). Denis SI, HI, and AVH's. No self injurious behaviors. Support system is his cousin. Also, he had another cousin, whom suffered from depression and was supportive, unfortunately passed away years ago. Currently lives with girlfriend that he has known since 2012. Denies alcohol use. Occasionally, takes CBD gummies and last use was 1.5 ago. No hx of trauma an/or abuse.   CCA Screening, Triage and Referral (STR)  Patient Reported Information How did you hear about [place]? No data recorded What Is the Reason for Your Visit/Call Today? [name] is a 35 y/o male that presents to [facility] as a walk-in. He presents with increased depressive symptoms and anxiety. Current triggers and stressors: 09/06/19 his father passed away from Kidney Disease; October 2021 he was dx's with Kidney disease and started on dyalysis (participates in treatment 3 days a week), Nov 09, 2018 grandmother passed away during the [place] pandemic. Meanwhile, patient has a girlfiend and 2 children (1 bio child). He is currently on disability and unable to work. Current depressive symptoms include feeling of guilt, irritability, wanting to be alone, and anxiety. Also, a hx of anger/irritability that has recently been exacerbated toward his girlfriend. Patient feels that she doesn't understand the severity of his medical issues and how  it effects his emotional/physicall well  being. His  sleep routine is poor (5-8 hrs per night) with difficulty going to sleep; staying sleep. Appetite is good. However, he reports significant weight loss due to medical issues over the past year. He has tried outpatient therapy when he was in [place]for support after his mothers passing when he was 59 y/o (approximately 2 appts). Also, he reports participating in a few therapy sesssions during the summer (2021). Denis SI, HI, and AVH's. No self injurious behaviors. Support system is his cousin. Also, he had another cousin, whom suffered from depression and was supportive, unfortunately passed away years ago. Currently lives with girlfriend that he has known since 2012. Denies alcohol use. Occasionally, takes CBD gummies and last use was 1.5 ago. No hx of trauma an/or abuse.  How Long Has This Been Causing You Problems? > than 6 months  What Do You Feel Would Help You the Most Today? Treatment for Depression or other mood problem; Stress Management; Medication(s)   Have You Recently Had Any Thoughts About Hurting Yourself? No  Are You Planning to Commit Suicide/Harm Yourself At This time? No   Have you Recently Had Thoughts About [place]? No  Are You Planning to Harm Someone at This Time? No  Explanation: No data recorded  Have You Used Any Alcohol or Drugs in the Past 24 Hours? No  How Long Ago Did You Use Drugs or Alcohol? No data recorded What Did You Use and How Much? No data recorded  Do You Currently Have a Therapist/Psychiatrist? No  Name of Therapist/Psychiatrist: No data recorded  Have You Been Recently Discharged From Any Office Practice or Programs? No  Explanation of Discharge From Practice/Program: No data recorded    CCA Screening Triage Referral Assessment Type of Contact: Face-to-Face  Telemedicine Service Delivery:   Is this Initial or Reassessment? No data recorded Date Telepsych consult ordered in [facility]:  No data recorded Time Telepsych  consult ordered in [facility]:  No data recorded Location of Assessment: [facility] Assessment Services  Provider Location: [facility]   Collateral Involvement: No data recorded  Does Patient Have a [place]? No data recorded Name and Contact of Legal Guardian: No data recorded If Minor and Not Living with Parent(s), Who has Custody? No data recorded Is CPS involved or ever been involved? Never  Is APS involved or ever been involved? Never   Patient Determined To Be At Risk for Harm To Self or Others Based on Review of Patient Reported Information or Presenting Complaint? No  Method: No data recorded Availability of Means: No data recorded Intent: No data recorded Notification Required: No data recorded Additional Information for Danger to Others Potential: No data recorded Additional Comments for Danger to Others Potential: No data recorded Are There Guns or Other Weapons in Your Home? No data recorded Types of Guns/Weapons: No data recorded Are These Weapons Safely Secured?                            No data recorded Who Could Verify You Are Able To Have These Secured: No data recorded Do You Have any Outstanding Charges, Pending Court Dates, Parole/Probation? No data recorded Contacted To Inform of Risk of Harm To Self or Others: No data recorded   Does Patient Present under Involuntary Commitment? No  IVC Papers Initial File Date: No data recorded  [place] of Residence: [place]   Patient  Currently Receiving the Following Services: -- (No psychiatric services at this time.)   Determination of Need: Emergent (2 hours)   Options For Referral: Medication Management; Intensive Outpatient Therapy; Partial Hospitalization; Outpatient Therapy     CCA Biopsychosocial Patient Reported Schizophrenia/Schizoaffective Diagnosis in Past: No   Strengths: communication; request help with services, motivated to get help   Mental Health  Symptoms Depression:   Difficulty Concentrating; Change in energy/activity; Irritability   Duration of Depressive symptoms:  Duration of Depressive Symptoms: Greater than two weeks   Mania:   N/A   Anxiety:    Irritability; Tension; Difficulty concentrating   Psychosis:   None   Duration of Psychotic symptoms:    Trauma:   None   Obsessions:   None   Compulsions:   None   Inattention:   None   Hyperactivity/Impulsivity:   None   Oppositional/Defiant Behaviors:  No data recorded  Emotional Irregularity:   None   Other Mood/Personality Symptoms:   depression and axiety    Mental Status Exam Appearance and self-care  Stature:   Average   Weight:   Thin   Clothing:   Neat/clean   Grooming:   Normal   Cosmetic use:   None   Posture/gait:   Normal   Motor activity:   Not Remarkable   Sensorium  Attention:   Normal   Concentration:   Normal   Orientation:   Time; Situation; Place; Person; Object   Recall/memory:   Normal   Affect and Mood  Affect:   Flat; Depressed; Anxious   Mood:   Depressed   Relating  Eye contact:   None   Facial expression:   Depressed   Attitude toward examiner:   Cooperative   Thought and Language  Speech flow:  Clear and Coherent   Thought content:   Appropriate to Mood and Circumstances   Preoccupation:   None   Hallucinations:   None   Organization:  No data recorded  [employer] of Knowledge:   Average   Intelligence:   Average   Abstraction:   Normal   Judgement:   Fair; Normal   Reality Testing:   Adequate   Insight:   Good   Decision Making:   Normal   Social Functioning  Social Maturity:   Responsible   Social Judgement:   Normal   Stress  Stressors:   Family conflict; Relationship; Transitions; Other (Comment) (Medical)   Coping Ability:   Normal   Skill Deficits:   Interpersonal   Supports:   Friends/Service system; Family      Religion: Religion/Spirituality Are You A Religious Person?: No  Leisure/Recreation: Leisure / Recreation Do You Have Hobbies?:  (unknown)  Exercise/Diet: Exercise/Diet Have You Gained or Lost A Significant Amount of Weight in the Past Six Months?: Yes-Lost Number of Pounds Lost?:  ([phone]) Do You Follow a Special Diet?: No Do You Have Any Trouble Sleeping?: Yes Explanation of Sleeping Difficulties: 5-8 hrs   CCA Employment/Education Employment/Work Situation: Employment / Work Situation Employment Situation: On disability Why is Patient on Disability: Medical Issues How Long has Patient Been on Disability: 1 year after dx's with Kidney Disease Patient's Job has Been Impacted by Current Illness: No Has Patient ever Been in the [employer]?: No  Education: Education Is Patient Currently Attending School?: No Last Grade Completed:  (H.S.) Did You Attend College?: No Did You Have An Individualized Education Program (IIEP): No Did You Have Any Difficulty At School?: No Patient's Education  Has Been Impacted by Current Illness: No   CCA Family/Childhood History Family and Relationship History: Family history Marital status: Single Does patient have children?: Yes How many children?:  (2 children (1 bio child)) How is patient's relationship with their children?: Good  Childhood History:  Childhood History By whom was/is the patient raised?: Both parents Did patient suffer any verbal/emotional/physical/sexual abuse as a child?: No Did patient suffer from severe childhood neglect?: No Has patient ever been sexually abused/assaulted/raped as an adolescent or adult?: No Was the patient ever a victim of a crime or a disaster?: No Witnessed domestic violence?: No Has patient been affected by domestic violence as an adult?: No  Child/Adolescent Assessment:     CCA Substance Use Alcohol/Drug Use: Alcohol / Drug Use Pain Medications: SEE MAR Prescriptions: SEE  MAR Over the Counter: SEE MAR History of alcohol / drug use?: No history of alcohol / drug abuse (occasionally uses CBD gummies)                         ASAM's:  Six Dimensions of Multidimensional Assessment  Dimension 1:  Acute Intoxication and/or Withdrawal Potential:      Dimension 2:  Biomedical Conditions and Complications:      Dimension 3:  Emotional, Behavioral, or Cognitive Conditions and Complications:     Dimension 4:  Readiness to Change:     Dimension 5:  Relapse, Continued use, or Continued Problem Potential:     Dimension 6:  Recovery/Living Environment:     ASAM Severity Score:    ASAM Recommended Level of Treatment:     Substance use Disorder (SUD)    Recommendations for Services/Supports/Treatments: Recommendations for Services/Supports/Treatments Recommendations For Services/Supports/Treatments: Individual Therapy, IOP (Intensive Outpatient Program), Medication Management, Partial Hospitalization, Other (Comment) (support groups)  Discharge Disposition:    DSM5 Diagnoses: Patient Active Problem List   Diagnosis Date Noted   Erectile dysfunction 07/30/2020   Pericardial effusion 12/15/2019   Hypertensive renal disease 12/15/2019   Thrombotic microangiopathy (HCC)    Anemia of chronic disease    ESRD (end stage renal disease) on dialysis ([place])    Acute renal failure (ARF) ([place]) 11/19/2019   Demand ischemia ([place])    Hypertensive emergency    Metabolic acidosis      Referrals to Alternative Service(s): Referred to Alternative Service(s):   Place:   Date:   Time:    Referred to Alternative Service(s):   Place:   Date:   Time:    Referred to Alternative Service(s):   Place:   Date:   Time:    Referred to Alternative Service(s):   Place:   Date:   Time:     Waldon Merl, Counselor

## 2021-01-29 NOTE — H&P (Signed)
Behavioral Health Medical Screening Exam  Samuel Burns is a 35 y.o. male who presented as a voluntary walk-in for evaluation of depression. Patient was seen, chart reviewed and case discussed with the treatment team and Dr Dwyane Dee. Patient has no previous psychiatric history. Patient lives with his girlfriend of 57 years, his son and girlfriends daughter. Patient stated he was diagnosed Oct 2021 with end stage renal disease and is on dialysis 3 days a week. He stated he lost his father in July 2021 from kidney disease. His mother died when he was 18 and his grandmother passed away in 05-22-2018 during the pandemic. He stated his depressive symptoms are; irritability and anger, guilt, anxiety, tearfulness and wanting to be alone. He stated he wants to have someone to talk to about his feelings, the losses and his anger that his girlfriend does not understand what he goes through and is judgmental when he tries to talk to her. He is unable to work and this adds to his stress. He stated treats people badly and is verbally abusive because he is angry most of the time. He stated he feels his cousin is the one person he can talk to.   He stated his sleep is fair, sleeping 5 - 8 hours per night with difficulty falling and staying asleep. He stated he has a lot of dreams. He reported a good appetite. He stated he lost a lot of weight after he was diagnosed going from 270 to 169 lbs. He denies drug and alcohol use. He stated he occasionally eats a CBD gummie, last time was 1.5 months ago. He is neatly dressed and maintains good eye contact. He speaks with normal tone, rate and rhythm. His thought process is logical and coherent. He exhibits good judgement and insight. He is calm, cooperative, polite and pleasant.  Patient denies suicidal ideation, plan or intent. He denies HI/AVH paranoia and delusions. There is no evidence that he is responding to external or internal stimuli. He denies access to weapons or means to harm  himself. He denies previous suicide attempts.  He stated he wants to find a support group or therapist that can help him gain a better perspective on his anger. Patient was provided with resources to several IOP/PHP groups that may be able to accommodate his HD schedule. He was also provided with resources for outpatient psychiatry and therapy. An email was sent to [facility] LCSW over the Nephrology group to inquire what other resources may be available to him and to request that she reach out to the patient.  Patient does not meet criteria for inpatient admission. He left [facility] with strict return precautions and was in no apparent distress.   Total Time spent with patient: 20 minutes  Psychiatric Specialty Exam:  Presentation  General Appearance: Appropriate for Environment; Casual; Fairly Groomed Eye Contact:Good Speech:Clear and Coherent; Normal Rate Speech Volume:Normal Handedness:Right  Mood and Affect  Mood:Depressed Affect:Appropriate; Congruent; Depressed  Thought Process  Thought Processes:Coherent; Goal Directed Descriptions of Associations:Intact Orientation:Full (Time, Place and Person) Thought Content:Logical History of Schizophrenia/Schizoaffective disorder:No data recorded Duration of Psychotic Symptoms:No data recorded Hallucinations:Hallucinations: None Ideas of Reference:None Suicidal Thoughts:Suicidal Thoughts: No Homicidal Thoughts:Homicidal Thoughts: No  Sensorium  Memory:Immediate Good; Recent Good; Remote Good Judgment:Good Insight:Good  Executive Functions  Concentration:Good Attention Span:Good [place] of Knowledge:Good Language:Good  Psychomotor Activity  Psychomotor Activity:Psychomotor Activity: Normal  Assets  Assets:Communication Skills; Desire for Improvement; Financial Resources/Insurance; Housing; Resilience; Social Support; Transportation; Intimacy  Sleep  Sleep:Sleep: Fair  Physical Exam: Physical Exam Vitals reviewed.   Constitutional:      Appearance: Normal appearance.  HENT:     Head: Normocephalic.  Pulmonary:     Effort: Pulmonary effort is normal.  Musculoskeletal:        General: Normal range of motion.     Cervical back: Normal range of motion.  Neurological:     General: No focal deficit present.     Mental Status: He is alert and oriented to person, place, and time.  Psychiatric:        Attention and Perception: Attention and perception normal. He does not perceive auditory or visual hallucinations.        Mood and Affect: Mood is depressed.        Speech: Speech normal.        Behavior: Behavior normal. Behavior is cooperative.        Thought Content: Thought content is not paranoid or delusional. Thought content does not include homicidal or suicidal ideation. Thought content does not include homicidal or suicidal plan.        Cognition and Memory: Cognition normal.   Review of Systems  Constitutional:  Negative for fever.  HENT: Negative.  Negative for congestion and sore throat.   Respiratory: Negative.  Negative for cough.   Gastrointestinal: Negative.   Musculoskeletal: Negative.   Neurological: Negative.   Psychiatric/Behavioral:  Positive for depression.   Blood pressure 109/70, pulse 96, temperature 98.6 F (37 C), temperature source Oral, resp. rate 20, SpO2 100 %. There is no height or weight on file to calculate BMI.  Musculoskeletal: Strength & Muscle Tone: within normal limits Gait & Station: normal Patient leans: N/A   Recommendations:  Based on my evaluation the patient does not appear to have an emergency medical condition. Patient was provided with outpatient resources for support groups, therapy and psychiatry. He was given strict return precautions in the event his mental health worsens; call 911, call the national suicide hotline, text 59, or go to the nearest emergency room. Patient left [facility] in no apparent distress.   Ethelene Hal, NP 01/29/2021,  5:07 PM

## 2021-01-31 DIAGNOSIS — Z992 Dependence on renal dialysis: Secondary | ICD-10-CM | POA: Diagnosis not present

## 2021-01-31 DIAGNOSIS — D631 Anemia in chronic kidney disease: Secondary | ICD-10-CM | POA: Diagnosis not present

## 2021-01-31 DIAGNOSIS — D689 Coagulation defect, unspecified: Secondary | ICD-10-CM | POA: Diagnosis not present

## 2021-01-31 DIAGNOSIS — N186 End stage renal disease: Secondary | ICD-10-CM | POA: Diagnosis not present

## 2021-01-31 DIAGNOSIS — N2581 Secondary hyperparathyroidism of renal origin: Secondary | ICD-10-CM | POA: Diagnosis not present

## 2021-02-03 ENCOUNTER — Other Ambulatory Visit: Payer: Self-pay | Admitting: Internal Medicine

## 2021-02-03 ENCOUNTER — Other Ambulatory Visit (HOSPITAL_COMMUNITY): Payer: Self-pay | Admitting: Nephrology

## 2021-02-03 ENCOUNTER — Encounter: Payer: Self-pay | Admitting: Internal Medicine

## 2021-02-03 DIAGNOSIS — Z992 Dependence on renal dialysis: Secondary | ICD-10-CM | POA: Diagnosis not present

## 2021-02-03 DIAGNOSIS — N2581 Secondary hyperparathyroidism of renal origin: Secondary | ICD-10-CM | POA: Diagnosis not present

## 2021-02-03 DIAGNOSIS — N186 End stage renal disease: Secondary | ICD-10-CM | POA: Diagnosis not present

## 2021-02-03 DIAGNOSIS — D631 Anemia in chronic kidney disease: Secondary | ICD-10-CM | POA: Diagnosis not present

## 2021-02-03 DIAGNOSIS — D689 Coagulation defect, unspecified: Secondary | ICD-10-CM | POA: Diagnosis not present

## 2021-02-03 MED ORDER — SERTRALINE HCL 50 MG PO TABS: ORAL_TABLET | ORAL | 3 refills | Status: DC

## 2021-02-05 DIAGNOSIS — D631 Anemia in chronic kidney disease: Secondary | ICD-10-CM | POA: Diagnosis not present

## 2021-02-05 DIAGNOSIS — Z992 Dependence on renal dialysis: Secondary | ICD-10-CM | POA: Diagnosis not present

## 2021-02-05 DIAGNOSIS — N186 End stage renal disease: Secondary | ICD-10-CM | POA: Diagnosis not present

## 2021-02-05 DIAGNOSIS — N2581 Secondary hyperparathyroidism of renal origin: Secondary | ICD-10-CM | POA: Diagnosis not present

## 2021-02-05 DIAGNOSIS — D689 Coagulation defect, unspecified: Secondary | ICD-10-CM | POA: Diagnosis not present

## 2021-02-07 ENCOUNTER — Encounter: Payer: Self-pay | Admitting: Internal Medicine

## 2021-02-07 ENCOUNTER — Other Ambulatory Visit: Payer: Self-pay | Admitting: Radiology

## 2021-02-07 DIAGNOSIS — Z0100 Encounter for examination of eyes and vision without abnormal findings: Secondary | ICD-10-CM

## 2021-02-07 DIAGNOSIS — Z992 Dependence on renal dialysis: Secondary | ICD-10-CM | POA: Diagnosis not present

## 2021-02-07 DIAGNOSIS — N186 End stage renal disease: Secondary | ICD-10-CM | POA: Diagnosis not present

## 2021-02-07 DIAGNOSIS — D631 Anemia in chronic kidney disease: Secondary | ICD-10-CM | POA: Diagnosis not present

## 2021-02-07 DIAGNOSIS — D689 Coagulation defect, unspecified: Secondary | ICD-10-CM | POA: Diagnosis not present

## 2021-02-07 DIAGNOSIS — N2581 Secondary hyperparathyroidism of renal origin: Secondary | ICD-10-CM | POA: Diagnosis not present

## 2021-02-10 DIAGNOSIS — N2581 Secondary hyperparathyroidism of renal origin: Secondary | ICD-10-CM | POA: Diagnosis not present

## 2021-02-10 DIAGNOSIS — N186 End stage renal disease: Secondary | ICD-10-CM | POA: Diagnosis not present

## 2021-02-10 DIAGNOSIS — D689 Coagulation defect, unspecified: Secondary | ICD-10-CM | POA: Diagnosis not present

## 2021-02-10 DIAGNOSIS — D631 Anemia in chronic kidney disease: Secondary | ICD-10-CM | POA: Diagnosis not present

## 2021-02-10 DIAGNOSIS — Z992 Dependence on renal dialysis: Secondary | ICD-10-CM | POA: Diagnosis not present

## 2021-02-11 ENCOUNTER — Other Ambulatory Visit: Payer: Self-pay

## 2021-02-11 ENCOUNTER — Encounter (HOSPITAL_COMMUNITY): Payer: Self-pay

## 2021-02-11 ENCOUNTER — Ambulatory Visit (HOSPITAL_COMMUNITY)
Admission: RE | Admit: 2021-02-11 | Discharge: 2021-02-11 | Disposition: A | Discharge status: Home / Self Care | Payer: Medicare Other | Source: Ambulatory Visit | Attending: Nephrology | Admitting: Nephrology

## 2021-02-11 ENCOUNTER — Other Ambulatory Visit (HOSPITAL_COMMUNITY): Payer: Self-pay | Admitting: Nephrology

## 2021-02-11 DIAGNOSIS — T82858A Stenosis of vascular prosthetic devices, implants and grafts, initial encounter: Secondary | ICD-10-CM | POA: Diagnosis not present

## 2021-02-11 DIAGNOSIS — N186 End stage renal disease: Secondary | ICD-10-CM | POA: Diagnosis not present

## 2021-02-11 DIAGNOSIS — Y841 Kidney dialysis as the cause of abnormal reaction of the patient, or of later complication, without mention of misadventure at the time of the procedure: Secondary | ICD-10-CM | POA: Diagnosis not present

## 2021-02-11 DIAGNOSIS — I120 Hypertensive chronic kidney disease with stage 5 chronic kidney disease or end stage renal disease: Secondary | ICD-10-CM | POA: Insufficient documentation

## 2021-02-11 DIAGNOSIS — Z992 Dependence on renal dialysis: Secondary | ICD-10-CM | POA: Diagnosis not present

## 2021-02-11 DIAGNOSIS — T82590A Other mechanical complication of surgically created arteriovenous fistula, initial encounter: Secondary | ICD-10-CM | POA: Diagnosis not present

## 2021-02-11 HISTORY — PX: IR AV DIALY SHUNT INTRO NEEDLE/INTRACATH INITIAL W/PTA/IMG RIGHT: IMG6116

## 2021-02-11 HISTORY — PX: IR US GUIDE VASC ACCESS RIGHT: IMG2390

## 2021-02-11 MED ORDER — IOHEXOL 300 MG/ML  SOLN
100.0000 mL | Freq: Once | INTRAMUSCULAR | Status: AC | PRN
  Administered 2021-02-11: 11:00:00 68 mL via INTRAVENOUS

## 2021-02-11 MED ORDER — MIDAZOLAM HCL 2 MG/2ML IJ SOLN
INTRAMUSCULAR | Status: AC | PRN
  Administered 2021-02-11: 1 mg via INTRAVENOUS
  Administered 2021-02-11: .5 mg via INTRAVENOUS
  Administered 2021-02-11: 1 mg via INTRAVENOUS

## 2021-02-11 MED ORDER — MIDAZOLAM HCL 2 MG/2ML IJ SOLN
INTRAMUSCULAR | Status: AC
  Filled 2021-02-11: qty 4

## 2021-02-11 MED ORDER — HEPARIN SODIUM (PORCINE) 1000 UNIT/ML IJ SOLN
INTRAMUSCULAR | Status: AC | PRN
  Administered 2021-02-11: 3000 [IU] via INTRAVENOUS

## 2021-02-11 MED ORDER — FENTANYL CITRATE (PF) 100 MCG/2ML IJ SOLN
INTRAMUSCULAR | Status: AC | PRN
  Administered 2021-02-11 (×3): 50 ug via INTRAVENOUS

## 2021-02-11 MED ORDER — ACETAMINOPHEN 325 MG PO TABS
ORAL_TABLET | ORAL | Status: AC
  Filled 2021-02-11: qty 2

## 2021-02-11 MED ORDER — SODIUM CHLORIDE 0.9 % IV SOLN: INTRAVENOUS | Status: DC

## 2021-02-11 MED ORDER — ACETAMINOPHEN 325 MG PO TABS
650.0000 mg | ORAL_TABLET | Freq: Once | ORAL | Status: AC
  Administered 2021-02-11: 12:00:00 650 mg via ORAL

## 2021-02-11 MED ORDER — HEPARIN SODIUM (PORCINE) 1000 UNIT/ML IJ SOLN
INTRAMUSCULAR | Status: AC
  Filled 2021-02-11: qty 10

## 2021-02-11 MED ORDER — FENTANYL CITRATE (PF) 100 MCG/2ML IJ SOLN
INTRAMUSCULAR | Status: AC
  Filled 2021-02-11: qty 4

## 2021-02-11 MED ORDER — LIDOCAINE HCL 1 % IJ SOLN
INTRAMUSCULAR | Status: AC
  Administered 2021-02-11: 10:00:00 5 mL
  Filled 2021-02-11: qty 20

## 2021-02-11 NOTE — Procedures (Signed)
Interventional Radiology Procedure Note  Procedure: Fistulagram with venoplasty  Indication: Poorly functioning fistula  Findings: Please refer to procedural dictation for full description.  Complications: None  EBL: < 10 mL  Miachel Roux, MD [phone]

## 2021-02-11 NOTE — H&P (Signed)
Chief Complaint: Patient was seen in consultation today for fistulogram, possible angioplasty, thrombectomy or possible tunneled catheter placement at the request of Rosita Fire for right upper arm brachial cephalic AVF malfunction.   Referring Physician(s): Rosita Fire  Supervising Physician: Mir, Sharen Heck  Patient Status: [facility] - Out-pt  History of Present Illness: Samuel Burns is a 35 y.o. male with PMH of HTN, ESRD on HD. Pt dialysis team is requesting fistulogram d/t malfunction.  Pt reports that at dialysis he was told there was "low pressure".   Past Medical History:  Diagnosis Date   Asthma    Renal failure     Past Surgical History:  Procedure Laterality Date   AV FISTULA PLACEMENT Right 11/24/2019   Procedure: Creation of RIGHT ARM Brachial/Cephalic ARTERIOVENOUS (AV) FISTULA.;  Surgeon: [name], MD;  Location: [place];  Service: Vascular;  Laterality: Right;   IR DIALY SHUNT INTRO NEEDLE/INTRACATH INITIAL W/IMG RIGHT Right 04/30/2020   IR FLUORO GUIDE CV LINE RIGHT  11/20/2019   IR REMOVAL TUN CV CATH W/O FL  06/04/2020   IR US GUIDE VASC ACCESS RIGHT  11/20/2019   IR US GUIDE VASC ACCESS RIGHT  04/30/2020    Allergies: Patient has no known allergies.  Medications: Prior to Admission medications   Medication Sig Start Date End Date Taking? Authorizing Provider  acetaminophen (TYLENOL) 500 MG tablet Take 1,000 mg by mouth every 6 (six) hours as needed for moderate pain.   Yes [provider]  amLODipine (NORVASC) 10 MG tablet TAKE 1 TABLET BY MOUTH EVERY DAY Patient taking differently: Take 10 mg by mouth at bedtime. 05/23/20 05/23/21 Yes Ladell Pier, MD  cloNIDine (CATAPRES) 0.1 MG tablet TAKE 1 TABLET (0.1 MG TOTAL) BY MOUTH 2 (TWO) TIMES DAILY. 12/15/19 02/11/21 Yes Ladell Pier, MD  ELDERBERRY PO Take 1 capsule by mouth daily.   Yes [provider]  ferric citrate (AURYXIA) 1 GM 210 MG(Fe) tablet Take 420  mg by mouth See admin instructions. Take 420 mg 3 times daily with meals and each snack   Yes [provider]  lidocaine-prilocaine (EMLA) cream Apply 1 application topically as needed (access fistula).   Yes [provider]  losartan (COZAAR) 50 MG tablet Take 50 mg by mouth daily.   Yes [provider]  MELATONIN PO Take 1-2 capsules by mouth at bedtime as needed (sleep).   Yes [provider]  Metoprolol Tartrate 75 MG TABS TAKE 1 TABLET BY MOUTH TWICE DAILY. 05/23/20 05/23/21 Yes Ladell Pier, MD  Multiple Vitamin (MULTIVITAMIN WITH MINERALS) TABS tablet Take 1 tablet by mouth daily.   Yes [provider]  Naphazoline HCl (CLEAR EYES OP) Place 1 drop into both eyes daily as needed (irritation).   Yes [provider]  sertraline (ZOLOFT) 50 MG tablet 1/2 tab PO daily x 3 wks then 1 tab Po daily 02/03/21  Yes Ladell Pier, MD  sildenafil (VIAGRA) 50 MG tablet 1 tab PO 1/2 hr prior to sex PRN.  Limit use to 1 tab/24 hr 07/30/20  Yes Ladell Pier, MD  losartan (COZAAR) 25 MG tablet TAKE 1 TABLET BY MOUTH EVERY DAY Patient not taking: Reported on 02/05/2021 05/23/20 05/23/21  Ladell Pier, MD  sevelamer carbonate (RENVELA) 800 MG tablet Take 1 tablet (800 mg total) by mouth 3 (three) times daily with meals. Patient not taking: Reported on 04/25/2020 12/15/19   Ladell Pier, MD     Family History  Problem  Relation Age of Onset   Hypertension Maternal Grandmother     Social History   Socioeconomic History   Marital status: Significant Other    Spouse name: Not on file   Number of children: Not on file   Years of education: Not on file   Highest education level: Not on file  Occupational History   Not on file  Tobacco Use   Smoking status: Never   Smokeless tobacco: Never  Substance and Sexual Activity   Alcohol use: No   Drug use: Yes    Types: Marijuana   Sexual activity: Not on file  Other Topics Concern    Not on file  Social History Narrative   Not on file   Social Determinants of Health   Financial Resource Strain: Not on file  Food Insecurity: Not on file  Transportation Needs: Not on file  Physical Activity: Not on file  Stress: Not on file  Social Connections: Not on file    Review of Systems: A 12 point ROS discussed and pertinent positives are indicated in the HPI above.  All other systems are negative.  Review of Systems  All other systems reviewed and are negative.  Vital Signs: BP 123/76    Pulse 81    Temp 98.5 F (36.9 C) (Oral)    Resp 15    Ht 5\' 7"  (1.702 m)    Wt 169 lb (76.7 kg)    SpO2 99%    BMI 26.47 kg/m   Physical Exam Constitutional:      Appearance: Normal appearance. He is not ill-appearing.  HENT:     Head: Normocephalic and atraumatic.     Mouth/Throat:     Mouth: Mucous membranes are moist.     Pharynx: Oropharynx is clear.  Cardiovascular:     Rate and Rhythm: Normal rate and regular rhythm.     Pulses: Normal pulses.     Heart sounds: Normal heart sounds. No murmur heard.   No friction rub. No gallop.  Pulmonary:     Effort: Pulmonary effort is normal. No respiratory distress.     Breath sounds: No stridor. No wheezing, rhonchi or rales.  Abdominal:     General: Bowel sounds are normal. There is no distension.     Palpations: Abdomen is soft.     Tenderness: There is no abdominal tenderness. There is no guarding.  Musculoskeletal:     Right lower leg: No edema.     Left lower leg: No edema.  Skin:    General: Skin is warm and dry.     Comments: R upper arm Av fistula  Bruit noted on auscultation   Neurological:     Mental Status: He is alert and oriented to person, place, and time.  Psychiatric:        Mood and Affect: Mood normal.        Behavior: Behavior normal.        Thought Content: Thought content normal.        Judgment: Judgment normal.    Imaging: No results found.  Labs:  CBC: Recent Labs    04/30/20 0805  WBC  5.1  HGB 12.4*  HCT 38.1*  PLT 193    COAGS: No results for input(s): INR, APTT in the last 8760 hours.  BMP: Recent Labs    04/30/20 0805  NA 138  K 4.1  CL 95*  CO2 31  GLUCOSE 83  BUN 44*  CALCIUM 9.8  CREATININE 8.12*  GFRNONAA 8*    LIVER FUNCTION TESTS: No results for input(s): BILITOT, AST, ALT, ALKPHOS, PROT, ALBUMIN in the last 8760 hours.  TUMOR MARKERS: No results for input(s): AFPTM, CEA, CA199, CHROMGRNA in the last 8760 hours.  Assessment and Plan: History of HTN, ESRD on HD. Pt dialysis team is requesting fistulogram d/t malfunction.  Pt reports that at dialysis he was told there was "low pressure".   Pt resting on stretcher. He is A&O x4, calm and pleasant.  He is in no distress.  He states he is NPO per order.   No labs needed today. Pt reports that his fistula has low pressure.   Risks and benefits discussed with the patient including, but not limited to bleeding, infection, vascular injury, pulmonary embolism, need for tunneled HD catheter placement or even death.  All of the patient's questions were answered, patient is agreeable to proceed. Consent signed and in chart.   Thank you for this interesting consult.  I greatly enjoyed meeting Dayshon Roback and look forward to participating in their care.  A copy of this report was sent to the requesting provider on this date.  Electronically Signed: Tyson Alias, NP 02/11/2021, 8:43 AM   I spent a total of 30 minutes in face to face in clinical consultation, greater than 50% of which was counseling/coordinating care for fistulogram with possible angioplasty, thrombectomy and/or tunneled catheter placement.

## 2021-02-12 DIAGNOSIS — N2581 Secondary hyperparathyroidism of renal origin: Secondary | ICD-10-CM | POA: Diagnosis not present

## 2021-02-12 DIAGNOSIS — D689 Coagulation defect, unspecified: Secondary | ICD-10-CM | POA: Diagnosis not present

## 2021-02-12 DIAGNOSIS — D631 Anemia in chronic kidney disease: Secondary | ICD-10-CM | POA: Diagnosis not present

## 2021-02-12 DIAGNOSIS — N186 End stage renal disease: Secondary | ICD-10-CM | POA: Diagnosis not present

## 2021-02-12 DIAGNOSIS — Z992 Dependence on renal dialysis: Secondary | ICD-10-CM | POA: Diagnosis not present

## 2021-02-14 DIAGNOSIS — D631 Anemia in chronic kidney disease: Secondary | ICD-10-CM | POA: Diagnosis not present

## 2021-02-14 DIAGNOSIS — N186 End stage renal disease: Secondary | ICD-10-CM | POA: Diagnosis not present

## 2021-02-14 DIAGNOSIS — D689 Coagulation defect, unspecified: Secondary | ICD-10-CM | POA: Diagnosis not present

## 2021-02-14 DIAGNOSIS — N2581 Secondary hyperparathyroidism of renal origin: Secondary | ICD-10-CM | POA: Diagnosis not present

## 2021-02-14 DIAGNOSIS — Z992 Dependence on renal dialysis: Secondary | ICD-10-CM | POA: Diagnosis not present

## 2021-02-15 DIAGNOSIS — Z992 Dependence on renal dialysis: Secondary | ICD-10-CM | POA: Diagnosis not present

## 2021-02-15 DIAGNOSIS — N186 End stage renal disease: Secondary | ICD-10-CM | POA: Diagnosis not present

## 2021-02-15 DIAGNOSIS — I129 Hypertensive chronic kidney disease with stage 1 through stage 4 chronic kidney disease, or unspecified chronic kidney disease: Secondary | ICD-10-CM | POA: Diagnosis not present

## 2021-02-17 DIAGNOSIS — D631 Anemia in chronic kidney disease: Secondary | ICD-10-CM | POA: Diagnosis not present

## 2021-02-17 DIAGNOSIS — N2581 Secondary hyperparathyroidism of renal origin: Secondary | ICD-10-CM | POA: Diagnosis not present

## 2021-02-17 DIAGNOSIS — R52 Pain, unspecified: Secondary | ICD-10-CM | POA: Diagnosis not present

## 2021-02-17 DIAGNOSIS — D689 Coagulation defect, unspecified: Secondary | ICD-10-CM | POA: Diagnosis not present

## 2021-02-17 DIAGNOSIS — Z992 Dependence on renal dialysis: Secondary | ICD-10-CM | POA: Diagnosis not present

## 2021-02-17 DIAGNOSIS — N186 End stage renal disease: Secondary | ICD-10-CM | POA: Diagnosis not present

## 2021-02-19 DIAGNOSIS — N186 End stage renal disease: Secondary | ICD-10-CM | POA: Diagnosis not present

## 2021-02-19 DIAGNOSIS — D689 Coagulation defect, unspecified: Secondary | ICD-10-CM | POA: Diagnosis not present

## 2021-02-19 DIAGNOSIS — N2581 Secondary hyperparathyroidism of renal origin: Secondary | ICD-10-CM | POA: Diagnosis not present

## 2021-02-19 DIAGNOSIS — Z992 Dependence on renal dialysis: Secondary | ICD-10-CM | POA: Diagnosis not present

## 2021-02-19 DIAGNOSIS — R52 Pain, unspecified: Secondary | ICD-10-CM | POA: Diagnosis not present

## 2021-02-19 DIAGNOSIS — D631 Anemia in chronic kidney disease: Secondary | ICD-10-CM | POA: Diagnosis not present

## 2021-02-21 DIAGNOSIS — D689 Coagulation defect, unspecified: Secondary | ICD-10-CM | POA: Diagnosis not present

## 2021-02-21 DIAGNOSIS — R52 Pain, unspecified: Secondary | ICD-10-CM | POA: Diagnosis not present

## 2021-02-21 DIAGNOSIS — N186 End stage renal disease: Secondary | ICD-10-CM | POA: Diagnosis not present

## 2021-02-21 DIAGNOSIS — D631 Anemia in chronic kidney disease: Secondary | ICD-10-CM | POA: Diagnosis not present

## 2021-02-21 DIAGNOSIS — N2581 Secondary hyperparathyroidism of renal origin: Secondary | ICD-10-CM | POA: Diagnosis not present

## 2021-02-21 DIAGNOSIS — Z992 Dependence on renal dialysis: Secondary | ICD-10-CM | POA: Diagnosis not present

## 2021-02-24 DIAGNOSIS — Z992 Dependence on renal dialysis: Secondary | ICD-10-CM | POA: Diagnosis not present

## 2021-02-24 DIAGNOSIS — D631 Anemia in chronic kidney disease: Secondary | ICD-10-CM | POA: Diagnosis not present

## 2021-02-24 DIAGNOSIS — D689 Coagulation defect, unspecified: Secondary | ICD-10-CM | POA: Diagnosis not present

## 2021-02-24 DIAGNOSIS — N2581 Secondary hyperparathyroidism of renal origin: Secondary | ICD-10-CM | POA: Diagnosis not present

## 2021-02-24 DIAGNOSIS — R52 Pain, unspecified: Secondary | ICD-10-CM | POA: Diagnosis not present

## 2021-02-24 DIAGNOSIS — N186 End stage renal disease: Secondary | ICD-10-CM | POA: Diagnosis not present

## 2021-02-26 DIAGNOSIS — D689 Coagulation defect, unspecified: Secondary | ICD-10-CM | POA: Diagnosis not present

## 2021-02-26 DIAGNOSIS — Z992 Dependence on renal dialysis: Secondary | ICD-10-CM | POA: Diagnosis not present

## 2021-02-26 DIAGNOSIS — N186 End stage renal disease: Secondary | ICD-10-CM | POA: Diagnosis not present

## 2021-02-26 DIAGNOSIS — N2581 Secondary hyperparathyroidism of renal origin: Secondary | ICD-10-CM | POA: Diagnosis not present

## 2021-02-26 DIAGNOSIS — R52 Pain, unspecified: Secondary | ICD-10-CM | POA: Diagnosis not present

## 2021-02-26 DIAGNOSIS — D631 Anemia in chronic kidney disease: Secondary | ICD-10-CM | POA: Diagnosis not present

## 2021-02-27 ENCOUNTER — Ambulatory Visit (HOSPITAL_COMMUNITY): Payer: Medicare Other | Admitting: Clinical

## 2021-02-28 DIAGNOSIS — N2581 Secondary hyperparathyroidism of renal origin: Secondary | ICD-10-CM | POA: Diagnosis not present

## 2021-02-28 DIAGNOSIS — R52 Pain, unspecified: Secondary | ICD-10-CM | POA: Diagnosis not present

## 2021-02-28 DIAGNOSIS — D631 Anemia in chronic kidney disease: Secondary | ICD-10-CM | POA: Diagnosis not present

## 2021-02-28 DIAGNOSIS — D689 Coagulation defect, unspecified: Secondary | ICD-10-CM | POA: Diagnosis not present

## 2021-02-28 DIAGNOSIS — N186 End stage renal disease: Secondary | ICD-10-CM | POA: Diagnosis not present

## 2021-02-28 DIAGNOSIS — Z992 Dependence on renal dialysis: Secondary | ICD-10-CM | POA: Diagnosis not present

## 2021-03-03 DIAGNOSIS — D689 Coagulation defect, unspecified: Secondary | ICD-10-CM | POA: Diagnosis not present

## 2021-03-03 DIAGNOSIS — R52 Pain, unspecified: Secondary | ICD-10-CM | POA: Diagnosis not present

## 2021-03-03 DIAGNOSIS — N186 End stage renal disease: Secondary | ICD-10-CM | POA: Diagnosis not present

## 2021-03-03 DIAGNOSIS — Z992 Dependence on renal dialysis: Secondary | ICD-10-CM | POA: Diagnosis not present

## 2021-03-03 DIAGNOSIS — N2581 Secondary hyperparathyroidism of renal origin: Secondary | ICD-10-CM | POA: Diagnosis not present

## 2021-03-03 DIAGNOSIS — D631 Anemia in chronic kidney disease: Secondary | ICD-10-CM | POA: Diagnosis not present

## 2021-03-04 ENCOUNTER — Encounter: Payer: Self-pay | Admitting: Internal Medicine

## 2021-03-05 DIAGNOSIS — N2581 Secondary hyperparathyroidism of renal origin: Secondary | ICD-10-CM | POA: Diagnosis not present

## 2021-03-05 DIAGNOSIS — R52 Pain, unspecified: Secondary | ICD-10-CM | POA: Diagnosis not present

## 2021-03-05 DIAGNOSIS — Z992 Dependence on renal dialysis: Secondary | ICD-10-CM | POA: Diagnosis not present

## 2021-03-05 DIAGNOSIS — D631 Anemia in chronic kidney disease: Secondary | ICD-10-CM | POA: Diagnosis not present

## 2021-03-05 DIAGNOSIS — D689 Coagulation defect, unspecified: Secondary | ICD-10-CM | POA: Diagnosis not present

## 2021-03-05 DIAGNOSIS — N186 End stage renal disease: Secondary | ICD-10-CM | POA: Diagnosis not present

## 2021-03-07 DIAGNOSIS — D631 Anemia in chronic kidney disease: Secondary | ICD-10-CM | POA: Diagnosis not present

## 2021-03-07 DIAGNOSIS — D689 Coagulation defect, unspecified: Secondary | ICD-10-CM | POA: Diagnosis not present

## 2021-03-07 DIAGNOSIS — R52 Pain, unspecified: Secondary | ICD-10-CM | POA: Diagnosis not present

## 2021-03-07 DIAGNOSIS — N2581 Secondary hyperparathyroidism of renal origin: Secondary | ICD-10-CM | POA: Diagnosis not present

## 2021-03-07 DIAGNOSIS — Z992 Dependence on renal dialysis: Secondary | ICD-10-CM | POA: Diagnosis not present

## 2021-03-07 DIAGNOSIS — N186 End stage renal disease: Secondary | ICD-10-CM | POA: Diagnosis not present

## 2021-03-10 DIAGNOSIS — N2581 Secondary hyperparathyroidism of renal origin: Secondary | ICD-10-CM | POA: Diagnosis not present

## 2021-03-10 DIAGNOSIS — D689 Coagulation defect, unspecified: Secondary | ICD-10-CM | POA: Diagnosis not present

## 2021-03-10 DIAGNOSIS — D631 Anemia in chronic kidney disease: Secondary | ICD-10-CM | POA: Diagnosis not present

## 2021-03-10 DIAGNOSIS — Z992 Dependence on renal dialysis: Secondary | ICD-10-CM | POA: Diagnosis not present

## 2021-03-10 DIAGNOSIS — R52 Pain, unspecified: Secondary | ICD-10-CM | POA: Diagnosis not present

## 2021-03-10 DIAGNOSIS — N186 End stage renal disease: Secondary | ICD-10-CM | POA: Diagnosis not present

## 2021-03-11 ENCOUNTER — Encounter: Payer: Self-pay | Admitting: Internal Medicine

## 2021-03-12 DIAGNOSIS — D631 Anemia in chronic kidney disease: Secondary | ICD-10-CM | POA: Diagnosis not present

## 2021-03-12 DIAGNOSIS — D689 Coagulation defect, unspecified: Secondary | ICD-10-CM | POA: Diagnosis not present

## 2021-03-12 DIAGNOSIS — R52 Pain, unspecified: Secondary | ICD-10-CM | POA: Diagnosis not present

## 2021-03-12 DIAGNOSIS — N186 End stage renal disease: Secondary | ICD-10-CM | POA: Diagnosis not present

## 2021-03-12 DIAGNOSIS — Z992 Dependence on renal dialysis: Secondary | ICD-10-CM | POA: Diagnosis not present

## 2021-03-12 DIAGNOSIS — N2581 Secondary hyperparathyroidism of renal origin: Secondary | ICD-10-CM | POA: Diagnosis not present

## 2021-03-12 MED ORDER — SERTRALINE HCL 50 MG PO TABS
50.0000 mg | ORAL_TABLET | Freq: Every day | ORAL | 3 refills | Status: DC
Start: 2021-03-12 — End: 2021-03-12

## 2021-03-12 MED ORDER — SERTRALINE HCL 50 MG PO TABS
50.0000 mg | ORAL_TABLET | Freq: Every day | ORAL | 3 refills | Status: DC
Start: 2021-03-12 — End: 2021-04-22

## 2021-03-12 NOTE — Telephone Encounter (Signed)
Dr. Wynetta Emery pt is requesting a refill on Sertaline. The directions say 1/2 tab PO daily x 3 wks then 1 tab Po daily. Is pt suppose to be taking 1 tablet a day.

## 2021-03-14 DIAGNOSIS — N2581 Secondary hyperparathyroidism of renal origin: Secondary | ICD-10-CM | POA: Diagnosis not present

## 2021-03-14 DIAGNOSIS — R52 Pain, unspecified: Secondary | ICD-10-CM | POA: Diagnosis not present

## 2021-03-14 DIAGNOSIS — D631 Anemia in chronic kidney disease: Secondary | ICD-10-CM | POA: Diagnosis not present

## 2021-03-14 DIAGNOSIS — Z992 Dependence on renal dialysis: Secondary | ICD-10-CM | POA: Diagnosis not present

## 2021-03-14 DIAGNOSIS — D689 Coagulation defect, unspecified: Secondary | ICD-10-CM | POA: Diagnosis not present

## 2021-03-14 DIAGNOSIS — N186 End stage renal disease: Secondary | ICD-10-CM | POA: Diagnosis not present

## 2021-03-17 DIAGNOSIS — R52 Pain, unspecified: Secondary | ICD-10-CM | POA: Diagnosis not present

## 2021-03-17 DIAGNOSIS — D689 Coagulation defect, unspecified: Secondary | ICD-10-CM | POA: Diagnosis not present

## 2021-03-17 DIAGNOSIS — N2581 Secondary hyperparathyroidism of renal origin: Secondary | ICD-10-CM | POA: Diagnosis not present

## 2021-03-17 DIAGNOSIS — N186 End stage renal disease: Secondary | ICD-10-CM | POA: Diagnosis not present

## 2021-03-17 DIAGNOSIS — Z992 Dependence on renal dialysis: Secondary | ICD-10-CM | POA: Diagnosis not present

## 2021-03-17 DIAGNOSIS — D631 Anemia in chronic kidney disease: Secondary | ICD-10-CM | POA: Diagnosis not present

## 2021-03-18 DIAGNOSIS — N186 End stage renal disease: Secondary | ICD-10-CM | POA: Diagnosis not present

## 2021-03-18 DIAGNOSIS — I129 Hypertensive chronic kidney disease with stage 1 through stage 4 chronic kidney disease, or unspecified chronic kidney disease: Secondary | ICD-10-CM | POA: Diagnosis not present

## 2021-03-18 DIAGNOSIS — H5203 Hypermetropia, bilateral: Secondary | ICD-10-CM | POA: Diagnosis not present

## 2021-03-18 DIAGNOSIS — Z992 Dependence on renal dialysis: Secondary | ICD-10-CM | POA: Diagnosis not present

## 2021-03-19 DIAGNOSIS — D689 Coagulation defect, unspecified: Secondary | ICD-10-CM | POA: Diagnosis not present

## 2021-03-19 DIAGNOSIS — Z992 Dependence on renal dialysis: Secondary | ICD-10-CM | POA: Diagnosis not present

## 2021-03-19 DIAGNOSIS — N2581 Secondary hyperparathyroidism of renal origin: Secondary | ICD-10-CM | POA: Diagnosis not present

## 2021-03-19 DIAGNOSIS — D631 Anemia in chronic kidney disease: Secondary | ICD-10-CM | POA: Diagnosis not present

## 2021-03-19 DIAGNOSIS — N186 End stage renal disease: Secondary | ICD-10-CM | POA: Diagnosis not present

## 2021-03-21 DIAGNOSIS — D689 Coagulation defect, unspecified: Secondary | ICD-10-CM | POA: Diagnosis not present

## 2021-03-21 DIAGNOSIS — Z992 Dependence on renal dialysis: Secondary | ICD-10-CM | POA: Diagnosis not present

## 2021-03-21 DIAGNOSIS — N2581 Secondary hyperparathyroidism of renal origin: Secondary | ICD-10-CM | POA: Diagnosis not present

## 2021-03-21 DIAGNOSIS — D631 Anemia in chronic kidney disease: Secondary | ICD-10-CM | POA: Diagnosis not present

## 2021-03-21 DIAGNOSIS — N186 End stage renal disease: Secondary | ICD-10-CM | POA: Diagnosis not present

## 2021-03-24 DIAGNOSIS — N2581 Secondary hyperparathyroidism of renal origin: Secondary | ICD-10-CM | POA: Diagnosis not present

## 2021-03-24 DIAGNOSIS — Z992 Dependence on renal dialysis: Secondary | ICD-10-CM | POA: Diagnosis not present

## 2021-03-24 DIAGNOSIS — D631 Anemia in chronic kidney disease: Secondary | ICD-10-CM | POA: Diagnosis not present

## 2021-03-24 DIAGNOSIS — D689 Coagulation defect, unspecified: Secondary | ICD-10-CM | POA: Diagnosis not present

## 2021-03-24 DIAGNOSIS — N186 End stage renal disease: Secondary | ICD-10-CM | POA: Diagnosis not present

## 2021-03-26 DIAGNOSIS — N2581 Secondary hyperparathyroidism of renal origin: Secondary | ICD-10-CM | POA: Diagnosis not present

## 2021-03-26 DIAGNOSIS — D631 Anemia in chronic kidney disease: Secondary | ICD-10-CM | POA: Diagnosis not present

## 2021-03-26 DIAGNOSIS — Z992 Dependence on renal dialysis: Secondary | ICD-10-CM | POA: Diagnosis not present

## 2021-03-26 DIAGNOSIS — D689 Coagulation defect, unspecified: Secondary | ICD-10-CM | POA: Diagnosis not present

## 2021-03-26 DIAGNOSIS — N186 End stage renal disease: Secondary | ICD-10-CM | POA: Diagnosis not present

## 2021-03-27 ENCOUNTER — Ambulatory Visit (HOSPITAL_COMMUNITY): Payer: Medicaid Other | Admitting: Clinical

## 2021-03-28 DIAGNOSIS — D689 Coagulation defect, unspecified: Secondary | ICD-10-CM | POA: Diagnosis not present

## 2021-03-28 DIAGNOSIS — Z992 Dependence on renal dialysis: Secondary | ICD-10-CM | POA: Diagnosis not present

## 2021-03-28 DIAGNOSIS — N2581 Secondary hyperparathyroidism of renal origin: Secondary | ICD-10-CM | POA: Diagnosis not present

## 2021-03-28 DIAGNOSIS — D631 Anemia in chronic kidney disease: Secondary | ICD-10-CM | POA: Diagnosis not present

## 2021-03-28 DIAGNOSIS — N186 End stage renal disease: Secondary | ICD-10-CM | POA: Diagnosis not present

## 2021-03-31 DIAGNOSIS — N186 End stage renal disease: Secondary | ICD-10-CM | POA: Diagnosis not present

## 2021-03-31 DIAGNOSIS — D689 Coagulation defect, unspecified: Secondary | ICD-10-CM | POA: Diagnosis not present

## 2021-03-31 DIAGNOSIS — Z992 Dependence on renal dialysis: Secondary | ICD-10-CM | POA: Diagnosis not present

## 2021-03-31 DIAGNOSIS — D631 Anemia in chronic kidney disease: Secondary | ICD-10-CM | POA: Diagnosis not present

## 2021-03-31 DIAGNOSIS — N2581 Secondary hyperparathyroidism of renal origin: Secondary | ICD-10-CM | POA: Diagnosis not present

## 2021-04-02 DIAGNOSIS — D631 Anemia in chronic kidney disease: Secondary | ICD-10-CM | POA: Diagnosis not present

## 2021-04-02 DIAGNOSIS — D689 Coagulation defect, unspecified: Secondary | ICD-10-CM | POA: Diagnosis not present

## 2021-04-02 DIAGNOSIS — N186 End stage renal disease: Secondary | ICD-10-CM | POA: Diagnosis not present

## 2021-04-02 DIAGNOSIS — N2581 Secondary hyperparathyroidism of renal origin: Secondary | ICD-10-CM | POA: Diagnosis not present

## 2021-04-02 DIAGNOSIS — Z992 Dependence on renal dialysis: Secondary | ICD-10-CM | POA: Diagnosis not present

## 2021-04-04 DIAGNOSIS — D689 Coagulation defect, unspecified: Secondary | ICD-10-CM | POA: Diagnosis not present

## 2021-04-04 DIAGNOSIS — Z992 Dependence on renal dialysis: Secondary | ICD-10-CM | POA: Diagnosis not present

## 2021-04-04 DIAGNOSIS — N2581 Secondary hyperparathyroidism of renal origin: Secondary | ICD-10-CM | POA: Diagnosis not present

## 2021-04-04 DIAGNOSIS — D631 Anemia in chronic kidney disease: Secondary | ICD-10-CM | POA: Diagnosis not present

## 2021-04-04 DIAGNOSIS — N186 End stage renal disease: Secondary | ICD-10-CM | POA: Diagnosis not present

## 2021-04-07 DIAGNOSIS — N2581 Secondary hyperparathyroidism of renal origin: Secondary | ICD-10-CM | POA: Diagnosis not present

## 2021-04-07 DIAGNOSIS — N186 End stage renal disease: Secondary | ICD-10-CM | POA: Diagnosis not present

## 2021-04-07 DIAGNOSIS — Z992 Dependence on renal dialysis: Secondary | ICD-10-CM | POA: Diagnosis not present

## 2021-04-07 DIAGNOSIS — D631 Anemia in chronic kidney disease: Secondary | ICD-10-CM | POA: Diagnosis not present

## 2021-04-07 DIAGNOSIS — D689 Coagulation defect, unspecified: Secondary | ICD-10-CM | POA: Diagnosis not present

## 2021-04-09 DIAGNOSIS — N186 End stage renal disease: Secondary | ICD-10-CM | POA: Diagnosis not present

## 2021-04-09 DIAGNOSIS — D689 Coagulation defect, unspecified: Secondary | ICD-10-CM | POA: Diagnosis not present

## 2021-04-09 DIAGNOSIS — Z992 Dependence on renal dialysis: Secondary | ICD-10-CM | POA: Diagnosis not present

## 2021-04-09 DIAGNOSIS — D631 Anemia in chronic kidney disease: Secondary | ICD-10-CM | POA: Diagnosis not present

## 2021-04-09 DIAGNOSIS — N2581 Secondary hyperparathyroidism of renal origin: Secondary | ICD-10-CM | POA: Diagnosis not present

## 2021-04-11 DIAGNOSIS — Z992 Dependence on renal dialysis: Secondary | ICD-10-CM | POA: Diagnosis not present

## 2021-04-11 DIAGNOSIS — D631 Anemia in chronic kidney disease: Secondary | ICD-10-CM | POA: Diagnosis not present

## 2021-04-11 DIAGNOSIS — N2581 Secondary hyperparathyroidism of renal origin: Secondary | ICD-10-CM | POA: Diagnosis not present

## 2021-04-11 DIAGNOSIS — D689 Coagulation defect, unspecified: Secondary | ICD-10-CM | POA: Diagnosis not present

## 2021-04-11 DIAGNOSIS — N186 End stage renal disease: Secondary | ICD-10-CM | POA: Diagnosis not present

## 2021-04-14 DIAGNOSIS — Z992 Dependence on renal dialysis: Secondary | ICD-10-CM | POA: Diagnosis not present

## 2021-04-14 DIAGNOSIS — N2581 Secondary hyperparathyroidism of renal origin: Secondary | ICD-10-CM | POA: Diagnosis not present

## 2021-04-14 DIAGNOSIS — D689 Coagulation defect, unspecified: Secondary | ICD-10-CM | POA: Diagnosis not present

## 2021-04-14 DIAGNOSIS — D631 Anemia in chronic kidney disease: Secondary | ICD-10-CM | POA: Diagnosis not present

## 2021-04-14 DIAGNOSIS — N186 End stage renal disease: Secondary | ICD-10-CM | POA: Diagnosis not present

## 2021-04-15 DIAGNOSIS — N186 End stage renal disease: Secondary | ICD-10-CM | POA: Diagnosis not present

## 2021-04-15 DIAGNOSIS — Z992 Dependence on renal dialysis: Secondary | ICD-10-CM | POA: Diagnosis not present

## 2021-04-15 DIAGNOSIS — I129 Hypertensive chronic kidney disease with stage 1 through stage 4 chronic kidney disease, or unspecified chronic kidney disease: Secondary | ICD-10-CM | POA: Diagnosis not present

## 2021-04-16 DIAGNOSIS — N2581 Secondary hyperparathyroidism of renal origin: Secondary | ICD-10-CM | POA: Diagnosis not present

## 2021-04-16 DIAGNOSIS — D631 Anemia in chronic kidney disease: Secondary | ICD-10-CM | POA: Diagnosis not present

## 2021-04-16 DIAGNOSIS — N186 End stage renal disease: Secondary | ICD-10-CM | POA: Diagnosis not present

## 2021-04-16 DIAGNOSIS — D689 Coagulation defect, unspecified: Secondary | ICD-10-CM | POA: Diagnosis not present

## 2021-04-16 DIAGNOSIS — Z992 Dependence on renal dialysis: Secondary | ICD-10-CM | POA: Diagnosis not present

## 2021-04-18 DIAGNOSIS — Z992 Dependence on renal dialysis: Secondary | ICD-10-CM | POA: Diagnosis not present

## 2021-04-18 DIAGNOSIS — D689 Coagulation defect, unspecified: Secondary | ICD-10-CM | POA: Diagnosis not present

## 2021-04-18 DIAGNOSIS — N2581 Secondary hyperparathyroidism of renal origin: Secondary | ICD-10-CM | POA: Diagnosis not present

## 2021-04-18 DIAGNOSIS — N186 End stage renal disease: Secondary | ICD-10-CM | POA: Diagnosis not present

## 2021-04-18 DIAGNOSIS — D631 Anemia in chronic kidney disease: Secondary | ICD-10-CM | POA: Diagnosis not present

## 2021-04-21 DIAGNOSIS — N186 End stage renal disease: Secondary | ICD-10-CM | POA: Diagnosis not present

## 2021-04-21 DIAGNOSIS — N2581 Secondary hyperparathyroidism of renal origin: Secondary | ICD-10-CM | POA: Diagnosis not present

## 2021-04-21 DIAGNOSIS — Z992 Dependence on renal dialysis: Secondary | ICD-10-CM | POA: Diagnosis not present

## 2021-04-21 DIAGNOSIS — D631 Anemia in chronic kidney disease: Secondary | ICD-10-CM | POA: Diagnosis not present

## 2021-04-21 DIAGNOSIS — D689 Coagulation defect, unspecified: Secondary | ICD-10-CM | POA: Diagnosis not present

## 2021-04-22 ENCOUNTER — Ambulatory Visit: Payer: Medicare Other | Attending: Internal Medicine | Admitting: Internal Medicine

## 2021-04-22 ENCOUNTER — Encounter: Payer: Self-pay | Admitting: Internal Medicine

## 2021-04-22 ENCOUNTER — Other Ambulatory Visit: Payer: Self-pay

## 2021-04-22 VITALS — BP 124/80 | HR 75 | Ht 67.0 in | Wt 166.6 lb

## 2021-04-22 DIAGNOSIS — F330 Major depressive disorder, recurrent, mild: Secondary | ICD-10-CM | POA: Diagnosis not present

## 2021-04-22 DIAGNOSIS — I129 Hypertensive chronic kidney disease with stage 1 through stage 4 chronic kidney disease, or unspecified chronic kidney disease: Secondary | ICD-10-CM

## 2021-04-22 DIAGNOSIS — N186 End stage renal disease: Secondary | ICD-10-CM | POA: Diagnosis not present

## 2021-04-22 DIAGNOSIS — Z992 Dependence on renal dialysis: Secondary | ICD-10-CM | POA: Diagnosis not present

## 2021-04-22 DIAGNOSIS — F320 Major depressive disorder, single episode, mild: Secondary | ICD-10-CM | POA: Insufficient documentation

## 2021-04-22 MED ORDER — METOPROLOL TARTRATE 75 MG PO TABS: 1.0000 | ORAL_TABLET | Freq: Two times a day (BID) | ORAL | 3 refills | Status: DC

## 2021-04-22 NOTE — Progress Notes (Signed)
Patient ID: Samuel Burns, male    DOB: Feb 12, 1986  MRN: [id number]  CC: Follow-up (4 follow up , need a refill on medication  on his metoprolol)   Subjective: Samuel Burns is a 36 y.o. male who presents for chronic ds management His concerns today include:  ESRD on HD, hypertensive renal disease, anemia of chronic disease, ED  MDD: Diagnosed with mild depression on last visit.  Referred to behavioral health.  Seen by an NP with [facility] 1 time in December.  Since then patient had contacted me via MyChart requesting to be started on antidepressant.  Started on Zoloft 50 mg.  Patient reports he is doing well on the medication.  Since then he has been receiving [place] through [employer].  He is seen once a week and reports he is doing well.  Dose of Zoloft increased to 100 mg last week by then.  HYPERTENSION Currently taking: see medication list.  Reports compliance with Cozaar 50 mg daily, metoprolol 75 mg twice a day, clonidine 0.1 mg twice a day, amlodipine 10 mg daily.  Requests refill on metoprolol today. Med Adherence: [x]  Yes    []  No Medication side effects: []  Yes    [x]  No Adherence with salt restriction: [x]  Yes    []  No Home Monitoring?: [x]  Yes    []  No Monitoring Frequency: daily Home BP results range: reports good readings.  142/86 was the highest SOB? [x]  Yes over the wkend due to excess fluid  Chest Pain?: []  Yes    [x]  No Leg swelling?: []  Yes    [x]  No Headaches?: []  Yes    [x]  No Dizziness? []  Yes    [x]  No Comments:   He is compliant with going to his hemodialysis sessions on Mondays, Wednesdays and Fridays.  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     amLODipine (NORVASC) 10 MG tablet TAKE 1 TABLET BY MOUTH EVERY DAY (Patient taking differently: Take 10 mg by mouth at bedtime.) 30 tablet 6   ELDERBERRY PO Take 1 capsule by mouth daily.     ferric citrate (AURYXIA) 1 GM 210 MG(Fe) tablet Take 420 mg by mouth See admin instructions. Take 420 mg 3 times daily with meals and each snack     lidocaine-prilocaine (EMLA) cream Apply 1 application topically as needed (access fistula).     losartan (COZAAR) 50 MG tablet Take 50 mg by mouth daily.     MELATONIN PO Take 1-2 capsules by mouth at bedtime as needed (sleep).     Multiple Vitamin (MULTIVITAMIN WITH MINERALS) TABS tablet Take 1 tablet by mouth daily.     Naphazoline HCl (CLEAR EYES OP) Place 1 drop into both eyes daily as needed (irritation).     sevelamer carbonate (RENVELA) 800 MG tablet Take 1 tablet (800 mg total) by  mouth 3 (three) times daily with meals. 90 tablet 3   sildenafil (VIAGRA) 50 MG tablet 1 tab PO 1/2 hr prior to sex PRN.  Limit use to 1 tab/24 hr 10 tablet 2   cloNIDine (CATAPRES) 0.1 MG tablet TAKE 1 TABLET (0.1 MG TOTAL) BY MOUTH 2 (TWO) TIMES DAILY. 60 tablet 3   sertraline (ZOLOFT) 100 MG tablet Take 100 mg by mouth daily.     No current facility-administered medications on file prior to visit.    No Known Allergies  Social History   Socioeconomic History   Marital status: Significant Other    Spouse name: Not on file   Number of children: Not on file   Years of education: Not on file   Highest education level: Not on file  Occupational History   Not on file  Tobacco Use   Smoking status: Never   Smokeless tobacco: Never  Substance and Sexual  Activity   Alcohol use: No   Drug use: Yes    Types: Marijuana   Sexual activity: Not on file  Other Topics Concern   Not on file  Social History Narrative   Not on file   Social Determinants of Health   Financial Resource Strain: Not on file  Food Insecurity: Not on file  Transportation Needs: Not on file  Physical Activity: Not on file  Stress: Not on file  Social Connections: Not on file  Intimate Partner Violence: Not on file    Family History  Problem Relation Age of Onset   Hypertension Maternal Grandmother     Past Surgical History:  Procedure Laterality Date   AV FISTULA PLACEMENT Right 11/24/2019   Procedure: Creation of RIGHT ARM Brachial/Cephalic ARTERIOVENOUS (AV) FISTULA.;  Surgeon: [name], MD;  Location: [place];  Service: Vascular;  Laterality: Right;   IR AV DIALY SHUNT INTRO [place] W/PTA/IMG RIGHT Right 02/11/2021   IR DIALY SHUNT INTRO NEEDLE/INTRACATH INITIAL W/IMG RIGHT Right 04/30/2020   IR FLUORO GUIDE CV LINE RIGHT  11/20/2019   IR REMOVAL TUN CV CATH W/O FL  06/04/2020   IR US GUIDE VASC ACCESS RIGHT  11/20/2019   IR US GUIDE VASC ACCESS RIGHT  04/30/2020   IR US GUIDE VASC ACCESS RIGHT  02/11/2021    ROS: Review of Systems Negative except as stated above  PHYSICAL EXAM: BP 124/80    Pulse 75    Ht 5\' 7"  (1.702 m)    Wt 166 lb 9.6 oz (75.6 kg)    SpO2 98%    BMI 26.09 kg/m   Wt Readings from Last 3 Encounters:  04/22/21 166 lb 9.6 oz (75.6 kg)  02/11/21 169 lb (76.7 kg)  12/23/20 169 lb (76.7 kg)    Physical Exam   General appearance - alert, well appearing, young appearing African-American male and in no distress Mental status - normal mood, behavior, speech, dress, motor activity, and thought processes Neck - supple, no significant adenopathy Chest - clear to auscultation, no wheezes, rales or rhonchi, symmetric air entry Heart - normal rate, regular rhythm, normal S1, S2, no murmurs, rubs, clicks or  gallops Extremities - peripheral pulses normal, no pedal edema, no clubbing or cyanosis Depression screen [facility] 2/9 04/22/2021 12/23/2020 05/23/2020  Decreased Interest 0 2 0  Down, Depressed, Hopeless 0 1 2  PHQ - 2 Score 0 3 2  Altered sleeping - 0 0  Tired, decreased energy - 1 1  Change in appetite - 0 0  Feeling bad  or failure about yourself  - 2 2  Trouble concentrating - 1 0  Moving slowly or fidgety/restless - 0 0  Suicidal thoughts - 0 0  PHQ-9 Score - 7 5    CMP Latest Ref Rng & Units 04/30/2020 11/27/2019 11/26/2019  Glucose 70 - 99 mg/dL 83 88 85  BUN 6 - 20 mg/dL 44(H) 50(H) 41(H)  Creatinine 0.61 - 1.24 mg/dL 8.12(H) 11.88(H) 9.71(H)  Sodium 135 - 145 mmol/L 138 134(L) 136  Potassium 3.5 - 5.1 mmol/L 4.1 3.9 4.0  Chloride 98 - 111 mmol/L 95(L) 96(L) 97(L)  CO2 22 - 32 mmol/L 31 23 24   Calcium 8.9 - 10.3 mg/dL 9.8 8.9 8.8(L)  Total Protein 6.5 - 8.1 g/dL - - -  Total Bilirubin 0.3 - 1.2 mg/dL - - -  Alkaline Phos 38 - 126 U/L - - -  AST 15 - 41 U/L - - -  ALT 0 - 44 U/L - - -   Lipid Panel     Component Value Date/Time   CHOL 186 11/20/2019 0428   CHOL 157 04/08/2017 1520   TRIG 207 (H) 11/20/2019 0428   HDL 30 (L) 11/20/2019 0428   HDL 39 (L) 04/08/2017 1520   CHOLHDL 6.2 11/20/2019 0428   VLDL 41 (H) 11/20/2019 0428   LDLCALC 115 (H) 11/20/2019 0428   LDLCALC 95 04/08/2017 1520    CBC    Component Value Date/Time   WBC 5.1 04/30/2020 0805   RBC 3.80 (L) 04/30/2020 0805   HGB 12.4 (L) 04/30/2020 0805   HGB 10.4 (L) 12/15/2019 1546   HCT 38.1 (L) 04/30/2020 0805   HCT 32.3 (L) 12/15/2019 1546   PLT 193 04/30/2020 0805   PLT 249 12/15/2019 1546   MCV 100.3 (H) 04/30/2020 0805   MCV 92 12/15/2019 1546   MCH 32.6 04/30/2020 0805   MCHC 32.5 04/30/2020 0805   RDW 16.3 (H) 04/30/2020 0805   RDW 13.0 12/15/2019 1546    ASSESSMENT AND PLAN:  1. Hypertensive renal disease At goal.  Continue current medications listed above and low-salt diet. -  Metoprolol Tartrate 75 MG TABS; TAKE 1 TABLET BY MOUTH TWICE DAILY.  Dispense: 180 tablet; Refill: 3  2. Major depressive disorder, recurrent episode, mild ([place]) Improved and doing well on Zoloft and with counseling.  3. ESRD on hemodialysis [facility]) Patient compliant with going to hemodialysis 3 times a week.  He will continue.    Patient was given the opportunity to ask questions.  Patient verbalized understanding of the plan and was able to repeat key elements of the plan.   This documentation was completed using Radio producer.  Any transcriptional errors are unintentional.  No orders of the defined types were placed in this encounter.    Requested Prescriptions   Signed Prescriptions Disp Refills   Metoprolol Tartrate 75 MG TABS 180 tablet 3    Sig: TAKE 1 TABLET BY MOUTH TWICE DAILY.    Return in about 4 months (around 08/22/2021).  Karle Plumber, MD, FACP

## 2021-04-23 DIAGNOSIS — N186 End stage renal disease: Secondary | ICD-10-CM | POA: Diagnosis not present

## 2021-04-23 DIAGNOSIS — D631 Anemia in chronic kidney disease: Secondary | ICD-10-CM | POA: Diagnosis not present

## 2021-04-23 DIAGNOSIS — D689 Coagulation defect, unspecified: Secondary | ICD-10-CM | POA: Diagnosis not present

## 2021-04-23 DIAGNOSIS — N2581 Secondary hyperparathyroidism of renal origin: Secondary | ICD-10-CM | POA: Diagnosis not present

## 2021-04-23 DIAGNOSIS — Z992 Dependence on renal dialysis: Secondary | ICD-10-CM | POA: Diagnosis not present

## 2021-04-25 DIAGNOSIS — D689 Coagulation defect, unspecified: Secondary | ICD-10-CM | POA: Diagnosis not present

## 2021-04-25 DIAGNOSIS — D631 Anemia in chronic kidney disease: Secondary | ICD-10-CM | POA: Diagnosis not present

## 2021-04-25 DIAGNOSIS — N2581 Secondary hyperparathyroidism of renal origin: Secondary | ICD-10-CM | POA: Diagnosis not present

## 2021-04-25 DIAGNOSIS — N186 End stage renal disease: Secondary | ICD-10-CM | POA: Diagnosis not present

## 2021-04-25 DIAGNOSIS — Z992 Dependence on renal dialysis: Secondary | ICD-10-CM | POA: Diagnosis not present

## 2021-04-28 DIAGNOSIS — D631 Anemia in chronic kidney disease: Secondary | ICD-10-CM | POA: Diagnosis not present

## 2021-04-28 DIAGNOSIS — N186 End stage renal disease: Secondary | ICD-10-CM | POA: Diagnosis not present

## 2021-04-28 DIAGNOSIS — D689 Coagulation defect, unspecified: Secondary | ICD-10-CM | POA: Diagnosis not present

## 2021-04-28 DIAGNOSIS — Z992 Dependence on renal dialysis: Secondary | ICD-10-CM | POA: Diagnosis not present

## 2021-04-28 DIAGNOSIS — N2581 Secondary hyperparathyroidism of renal origin: Secondary | ICD-10-CM | POA: Diagnosis not present

## 2021-04-30 DIAGNOSIS — D689 Coagulation defect, unspecified: Secondary | ICD-10-CM | POA: Diagnosis not present

## 2021-04-30 DIAGNOSIS — N2581 Secondary hyperparathyroidism of renal origin: Secondary | ICD-10-CM | POA: Diagnosis not present

## 2021-04-30 DIAGNOSIS — N186 End stage renal disease: Secondary | ICD-10-CM | POA: Diagnosis not present

## 2021-04-30 DIAGNOSIS — Z992 Dependence on renal dialysis: Secondary | ICD-10-CM | POA: Diagnosis not present

## 2021-04-30 DIAGNOSIS — D631 Anemia in chronic kidney disease: Secondary | ICD-10-CM | POA: Diagnosis not present

## 2021-05-02 DIAGNOSIS — Z992 Dependence on renal dialysis: Secondary | ICD-10-CM | POA: Diagnosis not present

## 2021-05-02 DIAGNOSIS — D689 Coagulation defect, unspecified: Secondary | ICD-10-CM | POA: Diagnosis not present

## 2021-05-02 DIAGNOSIS — D631 Anemia in chronic kidney disease: Secondary | ICD-10-CM | POA: Diagnosis not present

## 2021-05-02 DIAGNOSIS — N2581 Secondary hyperparathyroidism of renal origin: Secondary | ICD-10-CM | POA: Diagnosis not present

## 2021-05-02 DIAGNOSIS — N186 End stage renal disease: Secondary | ICD-10-CM | POA: Diagnosis not present

## 2021-05-05 DIAGNOSIS — N186 End stage renal disease: Secondary | ICD-10-CM | POA: Diagnosis not present

## 2021-05-05 DIAGNOSIS — D689 Coagulation defect, unspecified: Secondary | ICD-10-CM | POA: Diagnosis not present

## 2021-05-05 DIAGNOSIS — Z992 Dependence on renal dialysis: Secondary | ICD-10-CM | POA: Diagnosis not present

## 2021-05-05 DIAGNOSIS — D631 Anemia in chronic kidney disease: Secondary | ICD-10-CM | POA: Diagnosis not present

## 2021-05-05 DIAGNOSIS — N2581 Secondary hyperparathyroidism of renal origin: Secondary | ICD-10-CM | POA: Diagnosis not present

## 2021-05-07 DIAGNOSIS — N2581 Secondary hyperparathyroidism of renal origin: Secondary | ICD-10-CM | POA: Diagnosis not present

## 2021-05-07 DIAGNOSIS — D631 Anemia in chronic kidney disease: Secondary | ICD-10-CM | POA: Diagnosis not present

## 2021-05-07 DIAGNOSIS — Z992 Dependence on renal dialysis: Secondary | ICD-10-CM | POA: Diagnosis not present

## 2021-05-07 DIAGNOSIS — N186 End stage renal disease: Secondary | ICD-10-CM | POA: Diagnosis not present

## 2021-05-07 DIAGNOSIS — D689 Coagulation defect, unspecified: Secondary | ICD-10-CM | POA: Diagnosis not present

## 2021-05-09 DIAGNOSIS — D689 Coagulation defect, unspecified: Secondary | ICD-10-CM | POA: Diagnosis not present

## 2021-05-09 DIAGNOSIS — Z992 Dependence on renal dialysis: Secondary | ICD-10-CM | POA: Diagnosis not present

## 2021-05-09 DIAGNOSIS — N2581 Secondary hyperparathyroidism of renal origin: Secondary | ICD-10-CM | POA: Diagnosis not present

## 2021-05-09 DIAGNOSIS — N186 End stage renal disease: Secondary | ICD-10-CM | POA: Diagnosis not present

## 2021-05-09 DIAGNOSIS — D631 Anemia in chronic kidney disease: Secondary | ICD-10-CM | POA: Diagnosis not present

## 2021-05-12 DIAGNOSIS — D689 Coagulation defect, unspecified: Secondary | ICD-10-CM | POA: Diagnosis not present

## 2021-05-12 DIAGNOSIS — N186 End stage renal disease: Secondary | ICD-10-CM | POA: Diagnosis not present

## 2021-05-12 DIAGNOSIS — Z992 Dependence on renal dialysis: Secondary | ICD-10-CM | POA: Diagnosis not present

## 2021-05-12 DIAGNOSIS — N2581 Secondary hyperparathyroidism of renal origin: Secondary | ICD-10-CM | POA: Diagnosis not present

## 2021-05-12 DIAGNOSIS — D631 Anemia in chronic kidney disease: Secondary | ICD-10-CM | POA: Diagnosis not present

## 2021-05-13 ENCOUNTER — Ambulatory Visit: Payer: Self-pay

## 2021-05-13 ENCOUNTER — Telehealth: Payer: Medicare Other | Admitting: Physician Assistant

## 2021-05-13 DIAGNOSIS — H6981 Other specified disorders of Eustachian tube, right ear: Secondary | ICD-10-CM | POA: Diagnosis not present

## 2021-05-13 MED ORDER — FLUTICASONE PROPIONATE 50 MCG/ACT NA SUSP: 2.0000 | Freq: Every day | NASAL | 0 refills | Status: AC

## 2021-05-13 NOTE — Progress Notes (Signed)
?Virtual Visit Consent  ? ?Samuel Burns, you are scheduled for a virtual visit with a [facility] provider today.   ?  ?Just as with appointments in the office, your consent must be obtained to participate.  Your consent will be active for this visit and any virtual visit you may have with one of our providers in the next 365 days.   ?  ?If you have a MyChart account, a copy of this consent can be sent to you electronically.  All virtual visits are billed to your insurance company just like a traditional visit in the office.   ? ?As this is a virtual visit, video technology does not allow for your provider to perform a traditional examination.  This may limit your provider's ability to fully assess your condition.  If your provider identifies any concerns that need to be evaluated in person or the need to arrange testing (such as labs, EKG, etc.), we will make arrangements to do so.   ?  ?Although advances in technology are sophisticated, we cannot ensure that it will always work on either your end or our end.  If the connection with a video visit is poor, the visit may have to be switched to a telephone visit.  With either a video or telephone visit, we are not always able to ensure that we have a secure connection.    ? ?I need to obtain your verbal consent now.   Are you willing to proceed with your visit today?  ?  ?Samuel Burns has provided verbal consent on 05/13/2021 for a virtual visit (video or telephone). ?  ?Samuel Rio, PA-C  ? ?Date: 05/13/2021 5:29 PM ? ? ?Virtual Visit via Video Note  ? ?Samuel Burns, connected with  Samuel Burns  ([id number], 24-Jul-1985) on 05/13/21 at  5:00 PM EDT by a video-enabled telemedicine application and verified that I am speaking with the correct person using two identifiers. ? ?Location: ?Patient: Virtual Visit Location Patient: Home ?Provider: Virtual Visit Location Provider: Home Office ?  ?I discussed the limitations of evaluation and management by  telemedicine and the availability of in person appointments. The patient expressed understanding and agreed to proceed.   ? ?History of Present Illness: ?Samuel Burns is a 36 y.o. who identifies as a male who was assigned male at birth, and is being seen today for sore throat and R ear pain starting Saturday night. Some mild sinus congestion with this. Notes that pain is only with swallowing. Some pressure and popping of ear. Denies fevers, chills or aches. Denies symptoms of L ear. Denies recent travel or sick contact. Is starting to get some allergy symptoms with season change. Has taken Ibuprofen for pain. 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 ? ?HPI: HPI  ?Problems:  ?Patient Active Problem List  ? Diagnosis Date Noted  ? 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 Erectile dysfunction 07/30/2020  ? Pericardial effusion 12/15/2019  ? Hypertensive renal disease 12/15/2019  ? 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 Anemia of chronic disease   ? ESRD (end stage renal disease) on dialysis [facility])   ? Acute renal failure (ARF) ([place]) 11/19/2019  ? Demand ischemia ([place])   ? Hypertensive emergency   ? Metabolic acidosis   ?  ?Allergies: No Known Allergies ?Medications:  ?Current Outpatient Medications:  ?  fluticasone (FLONASE) 50 MCG/ACT nasal spray, Place 2 sprays into both nostrils daily., Disp: 16 g, Rfl: 0 ?  acetaminophen (TYLENOL)  500 MG tablet, Take 1,000 mg by mouth every 6 (six) hours as needed for moderate pain., Disp: , Rfl:  ?  amLODipine (NORVASC) 10 MG tablet, TAKE 1 TABLET BY MOUTH EVERY DAY (Patient taking differently: Take 10 mg by mouth at bedtime.), Disp: 30 tablet, Rfl: 6 ?  cloNIDine (CATAPRES) 0.1 MG tablet, TAKE 1 TABLET (0.1 MG TOTAL) BY MOUTH 2 (TWO) TIMES DAILY., Disp: 60 tablet, Rfl: 3 ?  ELDERBERRY PO, Take 1 capsule by mouth daily., Disp: , Rfl:  ?  ferric citrate (AURYXIA) 1 GM 210 MG(Fe) tablet, Take 420 mg by mouth See admin instructions. Take 420 mg 3 times daily with meals and  each snack, Disp: , Rfl:  ?  lidocaine-prilocaine (EMLA) cream, Apply 1 application topically as needed (access fistula)., Disp: , Rfl:  ?  losartan (COZAAR) 50 MG tablet, Take 50 mg by mouth daily., Disp: , Rfl:  ?  MELATONIN PO, Take 1-2 capsules by mouth at bedtime as needed (sleep)., Disp: , Rfl:  ?  Metoprolol Tartrate 75 MG TABS, TAKE 1 TABLET BY MOUTH TWICE DAILY., Disp: 180 tablet, Rfl: 3 ?  Multiple Vitamin (MULTIVITAMIN WITH MINERALS) TABS tablet, Take 1 tablet by mouth daily., Disp: , Rfl:  ?  Naphazoline HCl (CLEAR EYES OP), Place 1 drop into both eyes daily as needed (irritation)., Disp: , Rfl:  ?  sertraline (ZOLOFT) 100 MG tablet, Take 100 mg by mouth daily., Disp: , Rfl:  ?  sevelamer carbonate (RENVELA) 800 MG tablet, Take 1 tablet (800 mg total) by mouth 3 (three) times daily with meals., Disp: 90 tablet, Rfl: 3 ?  sildenafil (VIAGRA) 50 MG tablet, 1 tab PO 1/2 hr prior to sex PRN.  Limit use to 1 tab/24 hr, Disp: 10 tablet, Rfl: 2 ? ?Observations/Objective: ?Patient is well-developed, well-nourished in no acute distress.  ?Resting comfortably at home.  ?Head is normocephalic, atraumatic.  ?No labored breathing. ?Speech is clear and coherent with logical content.  ?Patient is alert and oriented at baseline.  ? ?Assessment and Plan: ?1. Eustachian tube dysfunction, right ?- fluticasone (FLONASE) 50 MCG/ACT nasal spray; Place 2 sprays into both nostrils daily.  Dispense: 16 g; Refill: 0 ? ?Symptoms seem consistent with ETD secondary to allergic inflammation. Start OTC Claritin or Xyzal. Will begin Flonase. Auto insufflation exercises reviewed and demonstrated. Will need in-person follow-up if not resolving.  ? ?Follow Up Instructions: ?I discussed the assessment and treatment plan with the patient. The patient was provided an opportunity to ask questions and all were answered. The patient agreed with the plan and demonstrated an understanding of the instructions.  A copy of instructions were sent to  the patient via MyChart unless otherwise noted below.  ? ?The patient was advised to call back or seek an in-person evaluation if the symptoms worsen or if the condition fails to improve as anticipated. ? ?Time:  ?I spent 12 minutes with the patient via telehealth technology discussing the above problems/concerns.   ? ?Samuel Rio, PA-C ?

## 2021-05-13 NOTE — Patient Instructions (Signed)
?[name], thank you for joining [name], PA-C for today's virtual visit.  While this provider is not your primary care provider (PCP), if your PCP is located in our provider database this encounter information will be shared with them immediately following your visit. ? ?Consent: ?(Patient) Samuel Burns provided verbal consent for this virtual visit at the beginning of the encounter. ? ?Current Medications: ? ?Current Outpatient Medications:  ?  acetaminophen (TYLENOL) 500 MG tablet, Take 1,000 mg by mouth every 6 (six) hours as needed for moderate pain., Disp: , Rfl:  ?  amLODipine (NORVASC) 10 MG tablet, TAKE 1 TABLET BY MOUTH EVERY DAY (Patient taking differently: Take 10 mg by mouth at bedtime.), Disp: 30 tablet, Rfl: 6 ?  cloNIDine (CATAPRES) 0.1 MG tablet, TAKE 1 TABLET (0.1 MG TOTAL) BY MOUTH 2 (TWO) TIMES DAILY., Disp: 60 tablet, Rfl: 3 ?  ELDERBERRY PO, Take 1 capsule by mouth daily., Disp: , Rfl:  ?  ferric citrate (AURYXIA) 1 GM 210 MG(Fe) tablet, Take 420 mg by mouth See admin instructions. Take 420 mg 3 times daily with meals and each snack, Disp: , Rfl:  ?  lidocaine-prilocaine (EMLA) cream, Apply 1 application topically as needed (access fistula)., Disp: , Rfl:  ?  losartan (COZAAR) 50 MG tablet, Take 50 mg by mouth daily., Disp: , Rfl:  ?  MELATONIN PO, Take 1-2 capsules by mouth at bedtime as needed (sleep)., Disp: , Rfl:  ?  Metoprolol Tartrate 75 MG TABS, TAKE 1 TABLET BY MOUTH TWICE DAILY., Disp: 180 tablet, Rfl: 3 ?  Multiple Vitamin (MULTIVITAMIN WITH MINERALS) TABS tablet, Take 1 tablet by mouth daily., Disp: , Rfl:  ?  Naphazoline HCl (CLEAR EYES OP), Place 1 drop into both eyes daily as needed (irritation)., Disp: , Rfl:  ?  sertraline (ZOLOFT) 100 MG tablet, Take 100 mg by mouth daily., Disp: , Rfl:  ?  sevelamer carbonate (RENVELA) 800 MG tablet, Take 1 tablet (800 mg total) by mouth 3 (three) times daily with meals., Disp: 90 tablet, Rfl: 3 ?  sildenafil (VIAGRA) 50 MG  tablet, 1 tab PO 1/2 hr prior to sex PRN.  Limit use to 1 tab/24 hr, Disp: 10 tablet, Rfl: 2  ? ?Medications ordered in this encounter:  ?No orders of the defined types were placed in this encounter. ?  ? ?*If you need refills on other medications prior to your next appointment, please contact your pharmacy* ? ?Follow-Up: ?Call back or seek an in-person evaluation if the symptoms worsen or if the condition fails to improve as anticipated. ? ?Other Instructions ?Please start OTC Claritin or Xyzal once daily. ?Do the exercises discussed to open the eustachian tubes. ?Use the Flonase daily as directed. ?Continue regular medications and dialysis as scheduled.  ? ? ?If you have been instructed to have an in-person evaluation today at a local Urgent Care facility, please use the link below. It will take you to a list of all of our available [facility], including address, phone number and hours of operation. Please do not delay care.  ?[facility] ? ?If you or a family member do not have a primary care provider, use the link below to schedule a visit and establish care. When you choose a [facility] primary care physician or advanced practice provider, you gain a long-term partner in health. ?Find a Primary Care Provider ? ?Learn more about [facility]'s in-office and virtual care options: ?[facility]  ?

## 2021-05-13 NOTE — Telephone Encounter (Signed)
?  Chief Complaint: earache ?Symptoms: earache and sore throat  ?Frequency: 3 days ?Pertinent Negatives: Patient denies fever or runny nose cough ?Disposition: [] ED /[] Urgent Care (no appt availability in office) / [] Appointment(In office/virtual)/ [x]  [facility]/ [] Home Care/ [] Refused Recommended Disposition /[] [facility]/ []  Follow-up with PCP ?Additional Notes: pt states the pain is present mostly when swallowing. He took some Ibuprofen that seemed to help slightly. No appts were available with office.  ? ? ?Reason for Disposition ? Earache  (Exceptions: brief ear pain of < 60 minutes duration, earache occurring during air travel ? ?Answer Assessment - Initial Assessment Questions ?1. LOCATION: "Which ear is involved?" ?    R ear  ?2. ONSET: "When did the ear start hurting"  ?    Saturday night ?3. SEVERITY: "How bad is the pain?"  (Scale 1-10; mild, moderate or severe) ?  - MILD (1-3): doesn't interfere with normal activities  ?  - MODERATE (4-7): interferes with normal activities or awakens from sleep  ?  - SEVERE (8-10): excruciating pain, unable to do any normal activities  ?    5 ?4. URI SYMPTOMS: "Do you have a runny nose or cough?" ?    no ?5. FEVER: "Do you have a fever?" If Yes, ask: "What is your temperature, how was it measured, and when did it start?" ?    No ?7. OTHER SYMPTOMS: "Do you have any other symptoms?" (e.g., headache, stiff neck, dizziness, vomiting, runny nose, decreased hearing) ?    Sore throat ? ?Protocols used: Earache-A-AH ? ?

## 2021-05-14 DIAGNOSIS — N186 End stage renal disease: Secondary | ICD-10-CM | POA: Diagnosis not present

## 2021-05-14 DIAGNOSIS — Z992 Dependence on renal dialysis: Secondary | ICD-10-CM | POA: Diagnosis not present

## 2021-05-14 DIAGNOSIS — N2581 Secondary hyperparathyroidism of renal origin: Secondary | ICD-10-CM | POA: Diagnosis not present

## 2021-05-14 DIAGNOSIS — D689 Coagulation defect, unspecified: Secondary | ICD-10-CM | POA: Diagnosis not present

## 2021-05-14 DIAGNOSIS — D631 Anemia in chronic kidney disease: Secondary | ICD-10-CM | POA: Diagnosis not present

## 2021-05-16 DIAGNOSIS — D689 Coagulation defect, unspecified: Secondary | ICD-10-CM | POA: Diagnosis not present

## 2021-05-16 DIAGNOSIS — I129 Hypertensive chronic kidney disease with stage 1 through stage 4 chronic kidney disease, or unspecified chronic kidney disease: Secondary | ICD-10-CM | POA: Diagnosis not present

## 2021-05-16 DIAGNOSIS — D631 Anemia in chronic kidney disease: Secondary | ICD-10-CM | POA: Diagnosis not present

## 2021-05-16 DIAGNOSIS — N2581 Secondary hyperparathyroidism of renal origin: Secondary | ICD-10-CM | POA: Diagnosis not present

## 2021-05-16 DIAGNOSIS — Z992 Dependence on renal dialysis: Secondary | ICD-10-CM | POA: Diagnosis not present

## 2021-05-16 DIAGNOSIS — N186 End stage renal disease: Secondary | ICD-10-CM | POA: Diagnosis not present

## 2021-05-19 DIAGNOSIS — Z992 Dependence on renal dialysis: Secondary | ICD-10-CM | POA: Diagnosis not present

## 2021-05-19 DIAGNOSIS — N186 End stage renal disease: Secondary | ICD-10-CM | POA: Diagnosis not present

## 2021-05-19 DIAGNOSIS — N2581 Secondary hyperparathyroidism of renal origin: Secondary | ICD-10-CM | POA: Diagnosis not present

## 2021-05-19 DIAGNOSIS — D689 Coagulation defect, unspecified: Secondary | ICD-10-CM | POA: Diagnosis not present

## 2021-05-19 DIAGNOSIS — D631 Anemia in chronic kidney disease: Secondary | ICD-10-CM | POA: Diagnosis not present

## 2021-05-21 DIAGNOSIS — N186 End stage renal disease: Secondary | ICD-10-CM | POA: Diagnosis not present

## 2021-05-21 DIAGNOSIS — Z992 Dependence on renal dialysis: Secondary | ICD-10-CM | POA: Diagnosis not present

## 2021-05-21 DIAGNOSIS — D689 Coagulation defect, unspecified: Secondary | ICD-10-CM | POA: Diagnosis not present

## 2021-05-21 DIAGNOSIS — D631 Anemia in chronic kidney disease: Secondary | ICD-10-CM | POA: Diagnosis not present

## 2021-05-21 DIAGNOSIS — N2581 Secondary hyperparathyroidism of renal origin: Secondary | ICD-10-CM | POA: Diagnosis not present

## 2021-05-23 DIAGNOSIS — N2581 Secondary hyperparathyroidism of renal origin: Secondary | ICD-10-CM | POA: Diagnosis not present

## 2021-05-23 DIAGNOSIS — N186 End stage renal disease: Secondary | ICD-10-CM | POA: Diagnosis not present

## 2021-05-23 DIAGNOSIS — D631 Anemia in chronic kidney disease: Secondary | ICD-10-CM | POA: Diagnosis not present

## 2021-05-23 DIAGNOSIS — Z992 Dependence on renal dialysis: Secondary | ICD-10-CM | POA: Diagnosis not present

## 2021-05-23 DIAGNOSIS — D689 Coagulation defect, unspecified: Secondary | ICD-10-CM | POA: Diagnosis not present

## 2021-05-26 DIAGNOSIS — D631 Anemia in chronic kidney disease: Secondary | ICD-10-CM | POA: Diagnosis not present

## 2021-05-26 DIAGNOSIS — Z992 Dependence on renal dialysis: Secondary | ICD-10-CM | POA: Diagnosis not present

## 2021-05-26 DIAGNOSIS — N2581 Secondary hyperparathyroidism of renal origin: Secondary | ICD-10-CM | POA: Diagnosis not present

## 2021-05-26 DIAGNOSIS — N186 End stage renal disease: Secondary | ICD-10-CM | POA: Diagnosis not present

## 2021-05-26 DIAGNOSIS — D689 Coagulation defect, unspecified: Secondary | ICD-10-CM | POA: Diagnosis not present

## 2021-05-28 DIAGNOSIS — N2581 Secondary hyperparathyroidism of renal origin: Secondary | ICD-10-CM | POA: Diagnosis not present

## 2021-05-28 DIAGNOSIS — Z992 Dependence on renal dialysis: Secondary | ICD-10-CM | POA: Diagnosis not present

## 2021-05-28 DIAGNOSIS — N186 End stage renal disease: Secondary | ICD-10-CM | POA: Diagnosis not present

## 2021-05-28 DIAGNOSIS — D689 Coagulation defect, unspecified: Secondary | ICD-10-CM | POA: Diagnosis not present

## 2021-05-28 DIAGNOSIS — D631 Anemia in chronic kidney disease: Secondary | ICD-10-CM | POA: Diagnosis not present

## 2021-05-30 DIAGNOSIS — N2581 Secondary hyperparathyroidism of renal origin: Secondary | ICD-10-CM | POA: Diagnosis not present

## 2021-05-30 DIAGNOSIS — Z992 Dependence on renal dialysis: Secondary | ICD-10-CM | POA: Diagnosis not present

## 2021-05-30 DIAGNOSIS — D631 Anemia in chronic kidney disease: Secondary | ICD-10-CM | POA: Diagnosis not present

## 2021-05-30 DIAGNOSIS — D689 Coagulation defect, unspecified: Secondary | ICD-10-CM | POA: Diagnosis not present

## 2021-05-30 DIAGNOSIS — N186 End stage renal disease: Secondary | ICD-10-CM | POA: Diagnosis not present

## 2021-06-02 DIAGNOSIS — D689 Coagulation defect, unspecified: Secondary | ICD-10-CM | POA: Diagnosis not present

## 2021-06-02 DIAGNOSIS — Z992 Dependence on renal dialysis: Secondary | ICD-10-CM | POA: Diagnosis not present

## 2021-06-02 DIAGNOSIS — D631 Anemia in chronic kidney disease: Secondary | ICD-10-CM | POA: Diagnosis not present

## 2021-06-02 DIAGNOSIS — N186 End stage renal disease: Secondary | ICD-10-CM | POA: Diagnosis not present

## 2021-06-02 DIAGNOSIS — N2581 Secondary hyperparathyroidism of renal origin: Secondary | ICD-10-CM | POA: Diagnosis not present

## 2021-06-04 DIAGNOSIS — N2581 Secondary hyperparathyroidism of renal origin: Secondary | ICD-10-CM | POA: Diagnosis not present

## 2021-06-04 DIAGNOSIS — D631 Anemia in chronic kidney disease: Secondary | ICD-10-CM | POA: Diagnosis not present

## 2021-06-04 DIAGNOSIS — N186 End stage renal disease: Secondary | ICD-10-CM | POA: Diagnosis not present

## 2021-06-04 DIAGNOSIS — D689 Coagulation defect, unspecified: Secondary | ICD-10-CM | POA: Diagnosis not present

## 2021-06-04 DIAGNOSIS — Z992 Dependence on renal dialysis: Secondary | ICD-10-CM | POA: Diagnosis not present

## 2021-06-06 DIAGNOSIS — D631 Anemia in chronic kidney disease: Secondary | ICD-10-CM | POA: Diagnosis not present

## 2021-06-06 DIAGNOSIS — D689 Coagulation defect, unspecified: Secondary | ICD-10-CM | POA: Diagnosis not present

## 2021-06-06 DIAGNOSIS — N186 End stage renal disease: Secondary | ICD-10-CM | POA: Diagnosis not present

## 2021-06-06 DIAGNOSIS — Z992 Dependence on renal dialysis: Secondary | ICD-10-CM | POA: Diagnosis not present

## 2021-06-06 DIAGNOSIS — N2581 Secondary hyperparathyroidism of renal origin: Secondary | ICD-10-CM | POA: Diagnosis not present

## 2021-06-09 DIAGNOSIS — N186 End stage renal disease: Secondary | ICD-10-CM | POA: Diagnosis not present

## 2021-06-09 DIAGNOSIS — Z992 Dependence on renal dialysis: Secondary | ICD-10-CM | POA: Diagnosis not present

## 2021-06-09 DIAGNOSIS — D689 Coagulation defect, unspecified: Secondary | ICD-10-CM | POA: Diagnosis not present

## 2021-06-09 DIAGNOSIS — D631 Anemia in chronic kidney disease: Secondary | ICD-10-CM | POA: Diagnosis not present

## 2021-06-09 DIAGNOSIS — N2581 Secondary hyperparathyroidism of renal origin: Secondary | ICD-10-CM | POA: Diagnosis not present

## 2021-06-11 DIAGNOSIS — D631 Anemia in chronic kidney disease: Secondary | ICD-10-CM | POA: Diagnosis not present

## 2021-06-11 DIAGNOSIS — N186 End stage renal disease: Secondary | ICD-10-CM | POA: Diagnosis not present

## 2021-06-11 DIAGNOSIS — D689 Coagulation defect, unspecified: Secondary | ICD-10-CM | POA: Diagnosis not present

## 2021-06-11 DIAGNOSIS — N2581 Secondary hyperparathyroidism of renal origin: Secondary | ICD-10-CM | POA: Diagnosis not present

## 2021-06-11 DIAGNOSIS — Z992 Dependence on renal dialysis: Secondary | ICD-10-CM | POA: Diagnosis not present

## 2021-06-13 DIAGNOSIS — N186 End stage renal disease: Secondary | ICD-10-CM | POA: Diagnosis not present

## 2021-06-13 DIAGNOSIS — N2581 Secondary hyperparathyroidism of renal origin: Secondary | ICD-10-CM | POA: Diagnosis not present

## 2021-06-13 DIAGNOSIS — D689 Coagulation defect, unspecified: Secondary | ICD-10-CM | POA: Diagnosis not present

## 2021-06-13 DIAGNOSIS — Z992 Dependence on renal dialysis: Secondary | ICD-10-CM | POA: Diagnosis not present

## 2021-06-13 DIAGNOSIS — D631 Anemia in chronic kidney disease: Secondary | ICD-10-CM | POA: Diagnosis not present

## 2021-06-15 DIAGNOSIS — Z992 Dependence on renal dialysis: Secondary | ICD-10-CM | POA: Diagnosis not present

## 2021-06-15 DIAGNOSIS — N186 End stage renal disease: Secondary | ICD-10-CM | POA: Diagnosis not present

## 2021-06-15 DIAGNOSIS — I129 Hypertensive chronic kidney disease with stage 1 through stage 4 chronic kidney disease, or unspecified chronic kidney disease: Secondary | ICD-10-CM | POA: Diagnosis not present

## 2021-06-16 DIAGNOSIS — D689 Coagulation defect, unspecified: Secondary | ICD-10-CM | POA: Diagnosis not present

## 2021-06-16 DIAGNOSIS — Z992 Dependence on renal dialysis: Secondary | ICD-10-CM | POA: Diagnosis not present

## 2021-06-16 DIAGNOSIS — D631 Anemia in chronic kidney disease: Secondary | ICD-10-CM | POA: Diagnosis not present

## 2021-06-16 DIAGNOSIS — N2581 Secondary hyperparathyroidism of renal origin: Secondary | ICD-10-CM | POA: Diagnosis not present

## 2021-06-16 DIAGNOSIS — N186 End stage renal disease: Secondary | ICD-10-CM | POA: Diagnosis not present

## 2021-06-18 DIAGNOSIS — N2581 Secondary hyperparathyroidism of renal origin: Secondary | ICD-10-CM | POA: Diagnosis not present

## 2021-06-18 DIAGNOSIS — N186 End stage renal disease: Secondary | ICD-10-CM | POA: Diagnosis not present

## 2021-06-18 DIAGNOSIS — Z992 Dependence on renal dialysis: Secondary | ICD-10-CM | POA: Diagnosis not present

## 2021-06-18 DIAGNOSIS — D689 Coagulation defect, unspecified: Secondary | ICD-10-CM | POA: Diagnosis not present

## 2021-06-18 DIAGNOSIS — D631 Anemia in chronic kidney disease: Secondary | ICD-10-CM | POA: Diagnosis not present

## 2021-06-20 DIAGNOSIS — N186 End stage renal disease: Secondary | ICD-10-CM | POA: Diagnosis not present

## 2021-06-20 DIAGNOSIS — Z992 Dependence on renal dialysis: Secondary | ICD-10-CM | POA: Diagnosis not present

## 2021-06-20 DIAGNOSIS — D631 Anemia in chronic kidney disease: Secondary | ICD-10-CM | POA: Diagnosis not present

## 2021-06-20 DIAGNOSIS — N2581 Secondary hyperparathyroidism of renal origin: Secondary | ICD-10-CM | POA: Diagnosis not present

## 2021-06-20 DIAGNOSIS — D689 Coagulation defect, unspecified: Secondary | ICD-10-CM | POA: Diagnosis not present

## 2021-06-23 DIAGNOSIS — D689 Coagulation defect, unspecified: Secondary | ICD-10-CM | POA: Diagnosis not present

## 2021-06-23 DIAGNOSIS — D631 Anemia in chronic kidney disease: Secondary | ICD-10-CM | POA: Diagnosis not present

## 2021-06-23 DIAGNOSIS — N186 End stage renal disease: Secondary | ICD-10-CM | POA: Diagnosis not present

## 2021-06-23 DIAGNOSIS — N2581 Secondary hyperparathyroidism of renal origin: Secondary | ICD-10-CM | POA: Diagnosis not present

## 2021-06-23 DIAGNOSIS — Z992 Dependence on renal dialysis: Secondary | ICD-10-CM | POA: Diagnosis not present

## 2021-06-25 DIAGNOSIS — D689 Coagulation defect, unspecified: Secondary | ICD-10-CM | POA: Diagnosis not present

## 2021-06-25 DIAGNOSIS — N186 End stage renal disease: Secondary | ICD-10-CM | POA: Diagnosis not present

## 2021-06-25 DIAGNOSIS — D631 Anemia in chronic kidney disease: Secondary | ICD-10-CM | POA: Diagnosis not present

## 2021-06-25 DIAGNOSIS — Z992 Dependence on renal dialysis: Secondary | ICD-10-CM | POA: Diagnosis not present

## 2021-06-25 DIAGNOSIS — N2581 Secondary hyperparathyroidism of renal origin: Secondary | ICD-10-CM | POA: Diagnosis not present

## 2021-06-27 DIAGNOSIS — Z992 Dependence on renal dialysis: Secondary | ICD-10-CM | POA: Diagnosis not present

## 2021-06-27 DIAGNOSIS — D631 Anemia in chronic kidney disease: Secondary | ICD-10-CM | POA: Diagnosis not present

## 2021-06-27 DIAGNOSIS — N2581 Secondary hyperparathyroidism of renal origin: Secondary | ICD-10-CM | POA: Diagnosis not present

## 2021-06-27 DIAGNOSIS — N186 End stage renal disease: Secondary | ICD-10-CM | POA: Diagnosis not present

## 2021-06-27 DIAGNOSIS — D689 Coagulation defect, unspecified: Secondary | ICD-10-CM | POA: Diagnosis not present

## 2021-06-30 DIAGNOSIS — Z992 Dependence on renal dialysis: Secondary | ICD-10-CM | POA: Diagnosis not present

## 2021-06-30 DIAGNOSIS — N2581 Secondary hyperparathyroidism of renal origin: Secondary | ICD-10-CM | POA: Diagnosis not present

## 2021-06-30 DIAGNOSIS — D689 Coagulation defect, unspecified: Secondary | ICD-10-CM | POA: Diagnosis not present

## 2021-06-30 DIAGNOSIS — D631 Anemia in chronic kidney disease: Secondary | ICD-10-CM | POA: Diagnosis not present

## 2021-06-30 DIAGNOSIS — N186 End stage renal disease: Secondary | ICD-10-CM | POA: Diagnosis not present

## 2021-07-02 DIAGNOSIS — D631 Anemia in chronic kidney disease: Secondary | ICD-10-CM | POA: Diagnosis not present

## 2021-07-02 DIAGNOSIS — D689 Coagulation defect, unspecified: Secondary | ICD-10-CM | POA: Diagnosis not present

## 2021-07-02 DIAGNOSIS — Z992 Dependence on renal dialysis: Secondary | ICD-10-CM | POA: Diagnosis not present

## 2021-07-02 DIAGNOSIS — N2581 Secondary hyperparathyroidism of renal origin: Secondary | ICD-10-CM | POA: Diagnosis not present

## 2021-07-02 DIAGNOSIS — N186 End stage renal disease: Secondary | ICD-10-CM | POA: Diagnosis not present

## 2021-07-04 DIAGNOSIS — Z992 Dependence on renal dialysis: Secondary | ICD-10-CM | POA: Diagnosis not present

## 2021-07-04 DIAGNOSIS — D631 Anemia in chronic kidney disease: Secondary | ICD-10-CM | POA: Diagnosis not present

## 2021-07-04 DIAGNOSIS — N2581 Secondary hyperparathyroidism of renal origin: Secondary | ICD-10-CM | POA: Diagnosis not present

## 2021-07-04 DIAGNOSIS — N186 End stage renal disease: Secondary | ICD-10-CM | POA: Diagnosis not present

## 2021-07-04 DIAGNOSIS — D689 Coagulation defect, unspecified: Secondary | ICD-10-CM | POA: Diagnosis not present

## 2021-07-07 DIAGNOSIS — D689 Coagulation defect, unspecified: Secondary | ICD-10-CM | POA: Diagnosis not present

## 2021-07-07 DIAGNOSIS — N2581 Secondary hyperparathyroidism of renal origin: Secondary | ICD-10-CM | POA: Diagnosis not present

## 2021-07-07 DIAGNOSIS — N186 End stage renal disease: Secondary | ICD-10-CM | POA: Diagnosis not present

## 2021-07-07 DIAGNOSIS — Z992 Dependence on renal dialysis: Secondary | ICD-10-CM | POA: Diagnosis not present

## 2021-07-07 DIAGNOSIS — D631 Anemia in chronic kidney disease: Secondary | ICD-10-CM | POA: Diagnosis not present

## 2021-07-08 ENCOUNTER — Encounter: Payer: Self-pay | Admitting: Internal Medicine

## 2021-07-09 DIAGNOSIS — D631 Anemia in chronic kidney disease: Secondary | ICD-10-CM | POA: Diagnosis not present

## 2021-07-09 DIAGNOSIS — D689 Coagulation defect, unspecified: Secondary | ICD-10-CM | POA: Diagnosis not present

## 2021-07-09 DIAGNOSIS — N2581 Secondary hyperparathyroidism of renal origin: Secondary | ICD-10-CM | POA: Diagnosis not present

## 2021-07-09 DIAGNOSIS — N186 End stage renal disease: Secondary | ICD-10-CM | POA: Diagnosis not present

## 2021-07-09 DIAGNOSIS — Z992 Dependence on renal dialysis: Secondary | ICD-10-CM | POA: Diagnosis not present

## 2021-07-11 DIAGNOSIS — D631 Anemia in chronic kidney disease: Secondary | ICD-10-CM | POA: Diagnosis not present

## 2021-07-11 DIAGNOSIS — N186 End stage renal disease: Secondary | ICD-10-CM | POA: Diagnosis not present

## 2021-07-11 DIAGNOSIS — N2581 Secondary hyperparathyroidism of renal origin: Secondary | ICD-10-CM | POA: Diagnosis not present

## 2021-07-11 DIAGNOSIS — D689 Coagulation defect, unspecified: Secondary | ICD-10-CM | POA: Diagnosis not present

## 2021-07-11 DIAGNOSIS — Z992 Dependence on renal dialysis: Secondary | ICD-10-CM | POA: Diagnosis not present

## 2021-07-14 DIAGNOSIS — D631 Anemia in chronic kidney disease: Secondary | ICD-10-CM | POA: Diagnosis not present

## 2021-07-14 DIAGNOSIS — D689 Coagulation defect, unspecified: Secondary | ICD-10-CM | POA: Diagnosis not present

## 2021-07-14 DIAGNOSIS — Z992 Dependence on renal dialysis: Secondary | ICD-10-CM | POA: Diagnosis not present

## 2021-07-14 DIAGNOSIS — N186 End stage renal disease: Secondary | ICD-10-CM | POA: Diagnosis not present

## 2021-07-14 DIAGNOSIS — N2581 Secondary hyperparathyroidism of renal origin: Secondary | ICD-10-CM | POA: Diagnosis not present

## 2021-07-16 DIAGNOSIS — Z992 Dependence on renal dialysis: Secondary | ICD-10-CM | POA: Diagnosis not present

## 2021-07-16 DIAGNOSIS — D631 Anemia in chronic kidney disease: Secondary | ICD-10-CM | POA: Diagnosis not present

## 2021-07-16 DIAGNOSIS — N186 End stage renal disease: Secondary | ICD-10-CM | POA: Diagnosis not present

## 2021-07-16 DIAGNOSIS — N2581 Secondary hyperparathyroidism of renal origin: Secondary | ICD-10-CM | POA: Diagnosis not present

## 2021-07-16 DIAGNOSIS — I129 Hypertensive chronic kidney disease with stage 1 through stage 4 chronic kidney disease, or unspecified chronic kidney disease: Secondary | ICD-10-CM | POA: Diagnosis not present

## 2021-07-16 DIAGNOSIS — D689 Coagulation defect, unspecified: Secondary | ICD-10-CM | POA: Diagnosis not present

## 2021-07-18 DIAGNOSIS — D509 Iron deficiency anemia, unspecified: Secondary | ICD-10-CM | POA: Diagnosis not present

## 2021-07-18 DIAGNOSIS — N2581 Secondary hyperparathyroidism of renal origin: Secondary | ICD-10-CM | POA: Diagnosis not present

## 2021-07-18 DIAGNOSIS — D631 Anemia in chronic kidney disease: Secondary | ICD-10-CM | POA: Diagnosis not present

## 2021-07-18 DIAGNOSIS — Z992 Dependence on renal dialysis: Secondary | ICD-10-CM | POA: Diagnosis not present

## 2021-07-18 DIAGNOSIS — D689 Coagulation defect, unspecified: Secondary | ICD-10-CM | POA: Diagnosis not present

## 2021-07-18 DIAGNOSIS — N186 End stage renal disease: Secondary | ICD-10-CM | POA: Diagnosis not present

## 2021-07-21 DIAGNOSIS — D509 Iron deficiency anemia, unspecified: Secondary | ICD-10-CM | POA: Diagnosis not present

## 2021-07-21 DIAGNOSIS — N186 End stage renal disease: Secondary | ICD-10-CM | POA: Diagnosis not present

## 2021-07-21 DIAGNOSIS — N2581 Secondary hyperparathyroidism of renal origin: Secondary | ICD-10-CM | POA: Diagnosis not present

## 2021-07-21 DIAGNOSIS — D689 Coagulation defect, unspecified: Secondary | ICD-10-CM | POA: Diagnosis not present

## 2021-07-21 DIAGNOSIS — D631 Anemia in chronic kidney disease: Secondary | ICD-10-CM | POA: Diagnosis not present

## 2021-07-21 DIAGNOSIS — Z992 Dependence on renal dialysis: Secondary | ICD-10-CM | POA: Diagnosis not present

## 2021-07-23 DIAGNOSIS — D509 Iron deficiency anemia, unspecified: Secondary | ICD-10-CM | POA: Diagnosis not present

## 2021-07-23 DIAGNOSIS — Z992 Dependence on renal dialysis: Secondary | ICD-10-CM | POA: Diagnosis not present

## 2021-07-23 DIAGNOSIS — D631 Anemia in chronic kidney disease: Secondary | ICD-10-CM | POA: Diagnosis not present

## 2021-07-23 DIAGNOSIS — N186 End stage renal disease: Secondary | ICD-10-CM | POA: Diagnosis not present

## 2021-07-23 DIAGNOSIS — N2581 Secondary hyperparathyroidism of renal origin: Secondary | ICD-10-CM | POA: Diagnosis not present

## 2021-07-23 DIAGNOSIS — D689 Coagulation defect, unspecified: Secondary | ICD-10-CM | POA: Diagnosis not present

## 2021-07-25 DIAGNOSIS — D509 Iron deficiency anemia, unspecified: Secondary | ICD-10-CM | POA: Diagnosis not present

## 2021-07-25 DIAGNOSIS — N2581 Secondary hyperparathyroidism of renal origin: Secondary | ICD-10-CM | POA: Diagnosis not present

## 2021-07-25 DIAGNOSIS — D689 Coagulation defect, unspecified: Secondary | ICD-10-CM | POA: Diagnosis not present

## 2021-07-25 DIAGNOSIS — Z992 Dependence on renal dialysis: Secondary | ICD-10-CM | POA: Diagnosis not present

## 2021-07-25 DIAGNOSIS — D631 Anemia in chronic kidney disease: Secondary | ICD-10-CM | POA: Diagnosis not present

## 2021-07-25 DIAGNOSIS — N186 End stage renal disease: Secondary | ICD-10-CM | POA: Diagnosis not present

## 2021-07-28 DIAGNOSIS — N2581 Secondary hyperparathyroidism of renal origin: Secondary | ICD-10-CM | POA: Diagnosis not present

## 2021-07-28 DIAGNOSIS — D631 Anemia in chronic kidney disease: Secondary | ICD-10-CM | POA: Diagnosis not present

## 2021-07-28 DIAGNOSIS — D509 Iron deficiency anemia, unspecified: Secondary | ICD-10-CM | POA: Diagnosis not present

## 2021-07-28 DIAGNOSIS — D689 Coagulation defect, unspecified: Secondary | ICD-10-CM | POA: Diagnosis not present

## 2021-07-28 DIAGNOSIS — Z992 Dependence on renal dialysis: Secondary | ICD-10-CM | POA: Diagnosis not present

## 2021-07-28 DIAGNOSIS — N186 End stage renal disease: Secondary | ICD-10-CM | POA: Diagnosis not present

## 2021-07-30 DIAGNOSIS — D509 Iron deficiency anemia, unspecified: Secondary | ICD-10-CM | POA: Diagnosis not present

## 2021-07-30 DIAGNOSIS — N2581 Secondary hyperparathyroidism of renal origin: Secondary | ICD-10-CM | POA: Diagnosis not present

## 2021-07-30 DIAGNOSIS — D631 Anemia in chronic kidney disease: Secondary | ICD-10-CM | POA: Diagnosis not present

## 2021-07-30 DIAGNOSIS — D689 Coagulation defect, unspecified: Secondary | ICD-10-CM | POA: Diagnosis not present

## 2021-07-30 DIAGNOSIS — Z992 Dependence on renal dialysis: Secondary | ICD-10-CM | POA: Diagnosis not present

## 2021-07-30 DIAGNOSIS — N186 End stage renal disease: Secondary | ICD-10-CM | POA: Diagnosis not present

## 2021-08-01 DIAGNOSIS — D631 Anemia in chronic kidney disease: Secondary | ICD-10-CM | POA: Diagnosis not present

## 2021-08-01 DIAGNOSIS — N2581 Secondary hyperparathyroidism of renal origin: Secondary | ICD-10-CM | POA: Diagnosis not present

## 2021-08-01 DIAGNOSIS — D689 Coagulation defect, unspecified: Secondary | ICD-10-CM | POA: Diagnosis not present

## 2021-08-01 DIAGNOSIS — D509 Iron deficiency anemia, unspecified: Secondary | ICD-10-CM | POA: Diagnosis not present

## 2021-08-01 DIAGNOSIS — Z992 Dependence on renal dialysis: Secondary | ICD-10-CM | POA: Diagnosis not present

## 2021-08-01 DIAGNOSIS — N186 End stage renal disease: Secondary | ICD-10-CM | POA: Diagnosis not present

## 2021-08-04 DIAGNOSIS — N2581 Secondary hyperparathyroidism of renal origin: Secondary | ICD-10-CM | POA: Diagnosis not present

## 2021-08-04 DIAGNOSIS — D631 Anemia in chronic kidney disease: Secondary | ICD-10-CM | POA: Diagnosis not present

## 2021-08-04 DIAGNOSIS — Z992 Dependence on renal dialysis: Secondary | ICD-10-CM | POA: Diagnosis not present

## 2021-08-04 DIAGNOSIS — D689 Coagulation defect, unspecified: Secondary | ICD-10-CM | POA: Diagnosis not present

## 2021-08-04 DIAGNOSIS — N186 End stage renal disease: Secondary | ICD-10-CM | POA: Diagnosis not present

## 2021-08-04 DIAGNOSIS — D509 Iron deficiency anemia, unspecified: Secondary | ICD-10-CM | POA: Diagnosis not present

## 2021-08-06 DIAGNOSIS — N2581 Secondary hyperparathyroidism of renal origin: Secondary | ICD-10-CM | POA: Diagnosis not present

## 2021-08-06 DIAGNOSIS — D509 Iron deficiency anemia, unspecified: Secondary | ICD-10-CM | POA: Diagnosis not present

## 2021-08-06 DIAGNOSIS — D631 Anemia in chronic kidney disease: Secondary | ICD-10-CM | POA: Diagnosis not present

## 2021-08-06 DIAGNOSIS — D689 Coagulation defect, unspecified: Secondary | ICD-10-CM | POA: Diagnosis not present

## 2021-08-06 DIAGNOSIS — Z992 Dependence on renal dialysis: Secondary | ICD-10-CM | POA: Diagnosis not present

## 2021-08-06 DIAGNOSIS — N186 End stage renal disease: Secondary | ICD-10-CM | POA: Diagnosis not present

## 2021-08-08 DIAGNOSIS — D509 Iron deficiency anemia, unspecified: Secondary | ICD-10-CM | POA: Diagnosis not present

## 2021-08-08 DIAGNOSIS — D689 Coagulation defect, unspecified: Secondary | ICD-10-CM | POA: Diagnosis not present

## 2021-08-08 DIAGNOSIS — N186 End stage renal disease: Secondary | ICD-10-CM | POA: Diagnosis not present

## 2021-08-08 DIAGNOSIS — D631 Anemia in chronic kidney disease: Secondary | ICD-10-CM | POA: Diagnosis not present

## 2021-08-08 DIAGNOSIS — N2581 Secondary hyperparathyroidism of renal origin: Secondary | ICD-10-CM | POA: Diagnosis not present

## 2021-08-08 DIAGNOSIS — Z992 Dependence on renal dialysis: Secondary | ICD-10-CM | POA: Diagnosis not present

## 2021-08-11 DIAGNOSIS — D689 Coagulation defect, unspecified: Secondary | ICD-10-CM | POA: Diagnosis not present

## 2021-08-11 DIAGNOSIS — D509 Iron deficiency anemia, unspecified: Secondary | ICD-10-CM | POA: Diagnosis not present

## 2021-08-11 DIAGNOSIS — Z992 Dependence on renal dialysis: Secondary | ICD-10-CM | POA: Diagnosis not present

## 2021-08-11 DIAGNOSIS — D631 Anemia in chronic kidney disease: Secondary | ICD-10-CM | POA: Diagnosis not present

## 2021-08-11 DIAGNOSIS — N2581 Secondary hyperparathyroidism of renal origin: Secondary | ICD-10-CM | POA: Diagnosis not present

## 2021-08-11 DIAGNOSIS — N186 End stage renal disease: Secondary | ICD-10-CM | POA: Diagnosis not present

## 2021-08-13 DIAGNOSIS — N186 End stage renal disease: Secondary | ICD-10-CM | POA: Diagnosis not present

## 2021-08-13 DIAGNOSIS — D631 Anemia in chronic kidney disease: Secondary | ICD-10-CM | POA: Diagnosis not present

## 2021-08-13 DIAGNOSIS — D509 Iron deficiency anemia, unspecified: Secondary | ICD-10-CM | POA: Diagnosis not present

## 2021-08-13 DIAGNOSIS — N2581 Secondary hyperparathyroidism of renal origin: Secondary | ICD-10-CM | POA: Diagnosis not present

## 2021-08-13 DIAGNOSIS — D689 Coagulation defect, unspecified: Secondary | ICD-10-CM | POA: Diagnosis not present

## 2021-08-13 DIAGNOSIS — Z992 Dependence on renal dialysis: Secondary | ICD-10-CM | POA: Diagnosis not present

## 2021-08-15 DIAGNOSIS — N186 End stage renal disease: Secondary | ICD-10-CM | POA: Diagnosis not present

## 2021-08-15 DIAGNOSIS — D631 Anemia in chronic kidney disease: Secondary | ICD-10-CM | POA: Diagnosis not present

## 2021-08-15 DIAGNOSIS — N2581 Secondary hyperparathyroidism of renal origin: Secondary | ICD-10-CM | POA: Diagnosis not present

## 2021-08-15 DIAGNOSIS — Z992 Dependence on renal dialysis: Secondary | ICD-10-CM | POA: Diagnosis not present

## 2021-08-15 DIAGNOSIS — D509 Iron deficiency anemia, unspecified: Secondary | ICD-10-CM | POA: Diagnosis not present

## 2021-08-15 DIAGNOSIS — D689 Coagulation defect, unspecified: Secondary | ICD-10-CM | POA: Diagnosis not present

## 2021-08-15 DIAGNOSIS — I129 Hypertensive chronic kidney disease with stage 1 through stage 4 chronic kidney disease, or unspecified chronic kidney disease: Secondary | ICD-10-CM | POA: Diagnosis not present

## 2021-08-18 DIAGNOSIS — N2581 Secondary hyperparathyroidism of renal origin: Secondary | ICD-10-CM | POA: Diagnosis not present

## 2021-08-18 DIAGNOSIS — D631 Anemia in chronic kidney disease: Secondary | ICD-10-CM | POA: Diagnosis not present

## 2021-08-18 DIAGNOSIS — D509 Iron deficiency anemia, unspecified: Secondary | ICD-10-CM | POA: Diagnosis not present

## 2021-08-18 DIAGNOSIS — N186 End stage renal disease: Secondary | ICD-10-CM | POA: Diagnosis not present

## 2021-08-18 DIAGNOSIS — Z992 Dependence on renal dialysis: Secondary | ICD-10-CM | POA: Diagnosis not present

## 2021-08-18 DIAGNOSIS — D689 Coagulation defect, unspecified: Secondary | ICD-10-CM | POA: Diagnosis not present

## 2021-08-20 DIAGNOSIS — N2581 Secondary hyperparathyroidism of renal origin: Secondary | ICD-10-CM | POA: Diagnosis not present

## 2021-08-20 DIAGNOSIS — D509 Iron deficiency anemia, unspecified: Secondary | ICD-10-CM | POA: Diagnosis not present

## 2021-08-20 DIAGNOSIS — Z992 Dependence on renal dialysis: Secondary | ICD-10-CM | POA: Diagnosis not present

## 2021-08-20 DIAGNOSIS — D631 Anemia in chronic kidney disease: Secondary | ICD-10-CM | POA: Diagnosis not present

## 2021-08-20 DIAGNOSIS — D689 Coagulation defect, unspecified: Secondary | ICD-10-CM | POA: Diagnosis not present

## 2021-08-20 DIAGNOSIS — N186 End stage renal disease: Secondary | ICD-10-CM | POA: Diagnosis not present

## 2021-08-22 ENCOUNTER — Ambulatory Visit: Payer: Medicare Other | Admitting: Internal Medicine

## 2021-08-22 DIAGNOSIS — D509 Iron deficiency anemia, unspecified: Secondary | ICD-10-CM | POA: Diagnosis not present

## 2021-08-22 DIAGNOSIS — N186 End stage renal disease: Secondary | ICD-10-CM | POA: Diagnosis not present

## 2021-08-22 DIAGNOSIS — N2581 Secondary hyperparathyroidism of renal origin: Secondary | ICD-10-CM | POA: Diagnosis not present

## 2021-08-22 DIAGNOSIS — D689 Coagulation defect, unspecified: Secondary | ICD-10-CM | POA: Diagnosis not present

## 2021-08-22 DIAGNOSIS — D631 Anemia in chronic kidney disease: Secondary | ICD-10-CM | POA: Diagnosis not present

## 2021-08-22 DIAGNOSIS — Z992 Dependence on renal dialysis: Secondary | ICD-10-CM | POA: Diagnosis not present

## 2021-08-25 DIAGNOSIS — D631 Anemia in chronic kidney disease: Secondary | ICD-10-CM | POA: Diagnosis not present

## 2021-08-25 DIAGNOSIS — N2581 Secondary hyperparathyroidism of renal origin: Secondary | ICD-10-CM | POA: Diagnosis not present

## 2021-08-25 DIAGNOSIS — N186 End stage renal disease: Secondary | ICD-10-CM | POA: Diagnosis not present

## 2021-08-25 DIAGNOSIS — D689 Coagulation defect, unspecified: Secondary | ICD-10-CM | POA: Diagnosis not present

## 2021-08-25 DIAGNOSIS — Z992 Dependence on renal dialysis: Secondary | ICD-10-CM | POA: Diagnosis not present

## 2021-08-25 DIAGNOSIS — D509 Iron deficiency anemia, unspecified: Secondary | ICD-10-CM | POA: Diagnosis not present

## 2021-08-27 DIAGNOSIS — D509 Iron deficiency anemia, unspecified: Secondary | ICD-10-CM | POA: Diagnosis not present

## 2021-08-27 DIAGNOSIS — Z992 Dependence on renal dialysis: Secondary | ICD-10-CM | POA: Diagnosis not present

## 2021-08-27 DIAGNOSIS — D631 Anemia in chronic kidney disease: Secondary | ICD-10-CM | POA: Diagnosis not present

## 2021-08-27 DIAGNOSIS — N186 End stage renal disease: Secondary | ICD-10-CM | POA: Diagnosis not present

## 2021-08-27 DIAGNOSIS — D689 Coagulation defect, unspecified: Secondary | ICD-10-CM | POA: Diagnosis not present

## 2021-08-27 DIAGNOSIS — N2581 Secondary hyperparathyroidism of renal origin: Secondary | ICD-10-CM | POA: Diagnosis not present

## 2021-08-29 DIAGNOSIS — D689 Coagulation defect, unspecified: Secondary | ICD-10-CM | POA: Diagnosis not present

## 2021-08-29 DIAGNOSIS — D631 Anemia in chronic kidney disease: Secondary | ICD-10-CM | POA: Diagnosis not present

## 2021-08-29 DIAGNOSIS — Z992 Dependence on renal dialysis: Secondary | ICD-10-CM | POA: Diagnosis not present

## 2021-08-29 DIAGNOSIS — N2581 Secondary hyperparathyroidism of renal origin: Secondary | ICD-10-CM | POA: Diagnosis not present

## 2021-08-29 DIAGNOSIS — N186 End stage renal disease: Secondary | ICD-10-CM | POA: Diagnosis not present

## 2021-08-29 DIAGNOSIS — D509 Iron deficiency anemia, unspecified: Secondary | ICD-10-CM | POA: Diagnosis not present

## 2021-09-01 DIAGNOSIS — N186 End stage renal disease: Secondary | ICD-10-CM | POA: Diagnosis not present

## 2021-09-01 DIAGNOSIS — D631 Anemia in chronic kidney disease: Secondary | ICD-10-CM | POA: Diagnosis not present

## 2021-09-01 DIAGNOSIS — Z992 Dependence on renal dialysis: Secondary | ICD-10-CM | POA: Diagnosis not present

## 2021-09-01 DIAGNOSIS — N2581 Secondary hyperparathyroidism of renal origin: Secondary | ICD-10-CM | POA: Diagnosis not present

## 2021-09-01 DIAGNOSIS — D689 Coagulation defect, unspecified: Secondary | ICD-10-CM | POA: Diagnosis not present

## 2021-09-01 DIAGNOSIS — D509 Iron deficiency anemia, unspecified: Secondary | ICD-10-CM | POA: Diagnosis not present

## 2021-09-03 DIAGNOSIS — D509 Iron deficiency anemia, unspecified: Secondary | ICD-10-CM | POA: Diagnosis not present

## 2021-09-03 DIAGNOSIS — D631 Anemia in chronic kidney disease: Secondary | ICD-10-CM | POA: Diagnosis not present

## 2021-09-03 DIAGNOSIS — N2581 Secondary hyperparathyroidism of renal origin: Secondary | ICD-10-CM | POA: Diagnosis not present

## 2021-09-03 DIAGNOSIS — D689 Coagulation defect, unspecified: Secondary | ICD-10-CM | POA: Diagnosis not present

## 2021-09-03 DIAGNOSIS — Z992 Dependence on renal dialysis: Secondary | ICD-10-CM | POA: Diagnosis not present

## 2021-09-03 DIAGNOSIS — N186 End stage renal disease: Secondary | ICD-10-CM | POA: Diagnosis not present

## 2021-09-05 DIAGNOSIS — D631 Anemia in chronic kidney disease: Secondary | ICD-10-CM | POA: Diagnosis not present

## 2021-09-05 DIAGNOSIS — D509 Iron deficiency anemia, unspecified: Secondary | ICD-10-CM | POA: Diagnosis not present

## 2021-09-05 DIAGNOSIS — N2581 Secondary hyperparathyroidism of renal origin: Secondary | ICD-10-CM | POA: Diagnosis not present

## 2021-09-05 DIAGNOSIS — Z992 Dependence on renal dialysis: Secondary | ICD-10-CM | POA: Diagnosis not present

## 2021-09-05 DIAGNOSIS — D689 Coagulation defect, unspecified: Secondary | ICD-10-CM | POA: Diagnosis not present

## 2021-09-05 DIAGNOSIS — N186 End stage renal disease: Secondary | ICD-10-CM | POA: Diagnosis not present

## 2021-09-08 DIAGNOSIS — D509 Iron deficiency anemia, unspecified: Secondary | ICD-10-CM | POA: Diagnosis not present

## 2021-09-08 DIAGNOSIS — N2581 Secondary hyperparathyroidism of renal origin: Secondary | ICD-10-CM | POA: Diagnosis not present

## 2021-09-08 DIAGNOSIS — N186 End stage renal disease: Secondary | ICD-10-CM | POA: Diagnosis not present

## 2021-09-08 DIAGNOSIS — D689 Coagulation defect, unspecified: Secondary | ICD-10-CM | POA: Diagnosis not present

## 2021-09-08 DIAGNOSIS — D631 Anemia in chronic kidney disease: Secondary | ICD-10-CM | POA: Diagnosis not present

## 2021-09-08 DIAGNOSIS — Z992 Dependence on renal dialysis: Secondary | ICD-10-CM | POA: Diagnosis not present

## 2021-09-10 DIAGNOSIS — Z992 Dependence on renal dialysis: Secondary | ICD-10-CM | POA: Diagnosis not present

## 2021-09-10 DIAGNOSIS — N186 End stage renal disease: Secondary | ICD-10-CM | POA: Diagnosis not present

## 2021-09-10 DIAGNOSIS — N2581 Secondary hyperparathyroidism of renal origin: Secondary | ICD-10-CM | POA: Diagnosis not present

## 2021-09-10 DIAGNOSIS — D689 Coagulation defect, unspecified: Secondary | ICD-10-CM | POA: Diagnosis not present

## 2021-09-10 DIAGNOSIS — D509 Iron deficiency anemia, unspecified: Secondary | ICD-10-CM | POA: Diagnosis not present

## 2021-09-10 DIAGNOSIS — D631 Anemia in chronic kidney disease: Secondary | ICD-10-CM | POA: Diagnosis not present

## 2021-09-12 DIAGNOSIS — N2581 Secondary hyperparathyroidism of renal origin: Secondary | ICD-10-CM | POA: Diagnosis not present

## 2021-09-12 DIAGNOSIS — D631 Anemia in chronic kidney disease: Secondary | ICD-10-CM | POA: Diagnosis not present

## 2021-09-12 DIAGNOSIS — D689 Coagulation defect, unspecified: Secondary | ICD-10-CM | POA: Diagnosis not present

## 2021-09-12 DIAGNOSIS — N186 End stage renal disease: Secondary | ICD-10-CM | POA: Diagnosis not present

## 2021-09-12 DIAGNOSIS — Z992 Dependence on renal dialysis: Secondary | ICD-10-CM | POA: Diagnosis not present

## 2021-09-12 DIAGNOSIS — D509 Iron deficiency anemia, unspecified: Secondary | ICD-10-CM | POA: Diagnosis not present

## 2021-09-15 DIAGNOSIS — N2581 Secondary hyperparathyroidism of renal origin: Secondary | ICD-10-CM | POA: Diagnosis not present

## 2021-09-15 DIAGNOSIS — D689 Coagulation defect, unspecified: Secondary | ICD-10-CM | POA: Diagnosis not present

## 2021-09-15 DIAGNOSIS — I129 Hypertensive chronic kidney disease with stage 1 through stage 4 chronic kidney disease, or unspecified chronic kidney disease: Secondary | ICD-10-CM | POA: Diagnosis not present

## 2021-09-15 DIAGNOSIS — Z992 Dependence on renal dialysis: Secondary | ICD-10-CM | POA: Diagnosis not present

## 2021-09-15 DIAGNOSIS — N186 End stage renal disease: Secondary | ICD-10-CM | POA: Diagnosis not present

## 2021-09-15 DIAGNOSIS — D509 Iron deficiency anemia, unspecified: Secondary | ICD-10-CM | POA: Diagnosis not present

## 2021-09-15 DIAGNOSIS — D631 Anemia in chronic kidney disease: Secondary | ICD-10-CM | POA: Diagnosis not present

## 2021-09-17 DIAGNOSIS — D689 Coagulation defect, unspecified: Secondary | ICD-10-CM | POA: Diagnosis not present

## 2021-09-17 DIAGNOSIS — N186 End stage renal disease: Secondary | ICD-10-CM | POA: Diagnosis not present

## 2021-09-17 DIAGNOSIS — N2581 Secondary hyperparathyroidism of renal origin: Secondary | ICD-10-CM | POA: Diagnosis not present

## 2021-09-17 DIAGNOSIS — Z992 Dependence on renal dialysis: Secondary | ICD-10-CM | POA: Diagnosis not present

## 2021-09-17 DIAGNOSIS — D631 Anemia in chronic kidney disease: Secondary | ICD-10-CM | POA: Diagnosis not present

## 2021-09-19 DIAGNOSIS — N186 End stage renal disease: Secondary | ICD-10-CM | POA: Diagnosis not present

## 2021-09-19 DIAGNOSIS — N2581 Secondary hyperparathyroidism of renal origin: Secondary | ICD-10-CM | POA: Diagnosis not present

## 2021-09-19 DIAGNOSIS — Z992 Dependence on renal dialysis: Secondary | ICD-10-CM | POA: Diagnosis not present

## 2021-09-19 DIAGNOSIS — D631 Anemia in chronic kidney disease: Secondary | ICD-10-CM | POA: Diagnosis not present

## 2021-09-19 DIAGNOSIS — D689 Coagulation defect, unspecified: Secondary | ICD-10-CM | POA: Diagnosis not present

## 2021-09-22 DIAGNOSIS — Z992 Dependence on renal dialysis: Secondary | ICD-10-CM | POA: Diagnosis not present

## 2021-09-22 DIAGNOSIS — D689 Coagulation defect, unspecified: Secondary | ICD-10-CM | POA: Diagnosis not present

## 2021-09-22 DIAGNOSIS — N186 End stage renal disease: Secondary | ICD-10-CM | POA: Diagnosis not present

## 2021-09-22 DIAGNOSIS — D631 Anemia in chronic kidney disease: Secondary | ICD-10-CM | POA: Diagnosis not present

## 2021-09-22 DIAGNOSIS — N2581 Secondary hyperparathyroidism of renal origin: Secondary | ICD-10-CM | POA: Diagnosis not present

## 2021-09-23 ENCOUNTER — Ambulatory Visit: Payer: Medicare Other | Admitting: Internal Medicine

## 2021-09-24 DIAGNOSIS — Z992 Dependence on renal dialysis: Secondary | ICD-10-CM | POA: Diagnosis not present

## 2021-09-24 DIAGNOSIS — N186 End stage renal disease: Secondary | ICD-10-CM | POA: Diagnosis not present

## 2021-09-24 DIAGNOSIS — N2581 Secondary hyperparathyroidism of renal origin: Secondary | ICD-10-CM | POA: Diagnosis not present

## 2021-09-24 DIAGNOSIS — D631 Anemia in chronic kidney disease: Secondary | ICD-10-CM | POA: Diagnosis not present

## 2021-09-24 DIAGNOSIS — D689 Coagulation defect, unspecified: Secondary | ICD-10-CM | POA: Diagnosis not present

## 2021-09-26 DIAGNOSIS — D689 Coagulation defect, unspecified: Secondary | ICD-10-CM | POA: Diagnosis not present

## 2021-09-26 DIAGNOSIS — Z992 Dependence on renal dialysis: Secondary | ICD-10-CM | POA: Diagnosis not present

## 2021-09-26 DIAGNOSIS — D631 Anemia in chronic kidney disease: Secondary | ICD-10-CM | POA: Diagnosis not present

## 2021-09-26 DIAGNOSIS — N186 End stage renal disease: Secondary | ICD-10-CM | POA: Diagnosis not present

## 2021-09-26 DIAGNOSIS — N2581 Secondary hyperparathyroidism of renal origin: Secondary | ICD-10-CM | POA: Diagnosis not present

## 2021-09-29 DIAGNOSIS — Z992 Dependence on renal dialysis: Secondary | ICD-10-CM | POA: Diagnosis not present

## 2021-09-29 DIAGNOSIS — N2581 Secondary hyperparathyroidism of renal origin: Secondary | ICD-10-CM | POA: Diagnosis not present

## 2021-09-29 DIAGNOSIS — D689 Coagulation defect, unspecified: Secondary | ICD-10-CM | POA: Diagnosis not present

## 2021-09-29 DIAGNOSIS — N186 End stage renal disease: Secondary | ICD-10-CM | POA: Diagnosis not present

## 2021-09-29 DIAGNOSIS — D631 Anemia in chronic kidney disease: Secondary | ICD-10-CM | POA: Diagnosis not present

## 2021-10-01 DIAGNOSIS — N186 End stage renal disease: Secondary | ICD-10-CM | POA: Diagnosis not present

## 2021-10-01 DIAGNOSIS — Z992 Dependence on renal dialysis: Secondary | ICD-10-CM | POA: Diagnosis not present

## 2021-10-01 DIAGNOSIS — N2581 Secondary hyperparathyroidism of renal origin: Secondary | ICD-10-CM | POA: Diagnosis not present

## 2021-10-01 DIAGNOSIS — D689 Coagulation defect, unspecified: Secondary | ICD-10-CM | POA: Diagnosis not present

## 2021-10-01 DIAGNOSIS — D631 Anemia in chronic kidney disease: Secondary | ICD-10-CM | POA: Diagnosis not present

## 2021-10-03 DIAGNOSIS — D689 Coagulation defect, unspecified: Secondary | ICD-10-CM | POA: Diagnosis not present

## 2021-10-03 DIAGNOSIS — D631 Anemia in chronic kidney disease: Secondary | ICD-10-CM | POA: Diagnosis not present

## 2021-10-03 DIAGNOSIS — N186 End stage renal disease: Secondary | ICD-10-CM | POA: Diagnosis not present

## 2021-10-03 DIAGNOSIS — N2581 Secondary hyperparathyroidism of renal origin: Secondary | ICD-10-CM | POA: Diagnosis not present

## 2021-10-03 DIAGNOSIS — Z992 Dependence on renal dialysis: Secondary | ICD-10-CM | POA: Diagnosis not present

## 2021-10-06 DIAGNOSIS — N186 End stage renal disease: Secondary | ICD-10-CM | POA: Diagnosis not present

## 2021-10-06 DIAGNOSIS — D689 Coagulation defect, unspecified: Secondary | ICD-10-CM | POA: Diagnosis not present

## 2021-10-06 DIAGNOSIS — D631 Anemia in chronic kidney disease: Secondary | ICD-10-CM | POA: Diagnosis not present

## 2021-10-06 DIAGNOSIS — N2581 Secondary hyperparathyroidism of renal origin: Secondary | ICD-10-CM | POA: Diagnosis not present

## 2021-10-06 DIAGNOSIS — Z992 Dependence on renal dialysis: Secondary | ICD-10-CM | POA: Diagnosis not present

## 2021-10-08 DIAGNOSIS — N186 End stage renal disease: Secondary | ICD-10-CM | POA: Diagnosis not present

## 2021-10-08 DIAGNOSIS — D689 Coagulation defect, unspecified: Secondary | ICD-10-CM | POA: Diagnosis not present

## 2021-10-08 DIAGNOSIS — D631 Anemia in chronic kidney disease: Secondary | ICD-10-CM | POA: Diagnosis not present

## 2021-10-08 DIAGNOSIS — N2581 Secondary hyperparathyroidism of renal origin: Secondary | ICD-10-CM | POA: Diagnosis not present

## 2021-10-08 DIAGNOSIS — Z992 Dependence on renal dialysis: Secondary | ICD-10-CM | POA: Diagnosis not present

## 2021-10-10 DIAGNOSIS — D689 Coagulation defect, unspecified: Secondary | ICD-10-CM | POA: Diagnosis not present

## 2021-10-10 DIAGNOSIS — Z992 Dependence on renal dialysis: Secondary | ICD-10-CM | POA: Diagnosis not present

## 2021-10-10 DIAGNOSIS — D631 Anemia in chronic kidney disease: Secondary | ICD-10-CM | POA: Diagnosis not present

## 2021-10-10 DIAGNOSIS — N2581 Secondary hyperparathyroidism of renal origin: Secondary | ICD-10-CM | POA: Diagnosis not present

## 2021-10-10 DIAGNOSIS — N186 End stage renal disease: Secondary | ICD-10-CM | POA: Diagnosis not present

## 2021-10-13 DIAGNOSIS — N2581 Secondary hyperparathyroidism of renal origin: Secondary | ICD-10-CM | POA: Diagnosis not present

## 2021-10-13 DIAGNOSIS — D689 Coagulation defect, unspecified: Secondary | ICD-10-CM | POA: Diagnosis not present

## 2021-10-13 DIAGNOSIS — D631 Anemia in chronic kidney disease: Secondary | ICD-10-CM | POA: Diagnosis not present

## 2021-10-13 DIAGNOSIS — Z992 Dependence on renal dialysis: Secondary | ICD-10-CM | POA: Diagnosis not present

## 2021-10-13 DIAGNOSIS — N186 End stage renal disease: Secondary | ICD-10-CM | POA: Diagnosis not present

## 2021-10-15 DIAGNOSIS — Z992 Dependence on renal dialysis: Secondary | ICD-10-CM | POA: Diagnosis not present

## 2021-10-15 DIAGNOSIS — N186 End stage renal disease: Secondary | ICD-10-CM | POA: Diagnosis not present

## 2021-10-15 DIAGNOSIS — D631 Anemia in chronic kidney disease: Secondary | ICD-10-CM | POA: Diagnosis not present

## 2021-10-15 DIAGNOSIS — N2581 Secondary hyperparathyroidism of renal origin: Secondary | ICD-10-CM | POA: Diagnosis not present

## 2021-10-15 DIAGNOSIS — D689 Coagulation defect, unspecified: Secondary | ICD-10-CM | POA: Diagnosis not present

## 2021-10-16 DIAGNOSIS — Z992 Dependence on renal dialysis: Secondary | ICD-10-CM | POA: Diagnosis not present

## 2021-10-16 DIAGNOSIS — N186 End stage renal disease: Secondary | ICD-10-CM | POA: Diagnosis not present

## 2021-10-16 DIAGNOSIS — I129 Hypertensive chronic kidney disease with stage 1 through stage 4 chronic kidney disease, or unspecified chronic kidney disease: Secondary | ICD-10-CM | POA: Diagnosis not present

## 2021-10-17 DIAGNOSIS — N186 End stage renal disease: Secondary | ICD-10-CM | POA: Diagnosis not present

## 2021-10-17 DIAGNOSIS — D689 Coagulation defect, unspecified: Secondary | ICD-10-CM | POA: Diagnosis not present

## 2021-10-17 DIAGNOSIS — N2581 Secondary hyperparathyroidism of renal origin: Secondary | ICD-10-CM | POA: Diagnosis not present

## 2021-10-17 DIAGNOSIS — Z992 Dependence on renal dialysis: Secondary | ICD-10-CM | POA: Diagnosis not present

## 2021-10-17 DIAGNOSIS — D631 Anemia in chronic kidney disease: Secondary | ICD-10-CM | POA: Diagnosis not present

## 2021-10-20 DIAGNOSIS — D631 Anemia in chronic kidney disease: Secondary | ICD-10-CM | POA: Diagnosis not present

## 2021-10-20 DIAGNOSIS — N2581 Secondary hyperparathyroidism of renal origin: Secondary | ICD-10-CM | POA: Diagnosis not present

## 2021-10-20 DIAGNOSIS — Z992 Dependence on renal dialysis: Secondary | ICD-10-CM | POA: Diagnosis not present

## 2021-10-20 DIAGNOSIS — N186 End stage renal disease: Secondary | ICD-10-CM | POA: Diagnosis not present

## 2021-10-20 DIAGNOSIS — D689 Coagulation defect, unspecified: Secondary | ICD-10-CM | POA: Diagnosis not present

## 2021-10-22 DIAGNOSIS — Z992 Dependence on renal dialysis: Secondary | ICD-10-CM | POA: Diagnosis not present

## 2021-10-22 DIAGNOSIS — N186 End stage renal disease: Secondary | ICD-10-CM | POA: Diagnosis not present

## 2021-10-22 DIAGNOSIS — D631 Anemia in chronic kidney disease: Secondary | ICD-10-CM | POA: Diagnosis not present

## 2021-10-22 DIAGNOSIS — D689 Coagulation defect, unspecified: Secondary | ICD-10-CM | POA: Diagnosis not present

## 2021-10-22 DIAGNOSIS — N2581 Secondary hyperparathyroidism of renal origin: Secondary | ICD-10-CM | POA: Diagnosis not present

## 2021-10-23 ENCOUNTER — Other Ambulatory Visit: Payer: Self-pay

## 2021-10-23 ENCOUNTER — Emergency Department (HOSPITAL_COMMUNITY): Payer: Medicare Other

## 2021-10-23 ENCOUNTER — Encounter (HOSPITAL_COMMUNITY): Payer: Self-pay | Admitting: Emergency Medicine

## 2021-10-23 ENCOUNTER — Emergency Department (HOSPITAL_COMMUNITY)
Admission: EM | Admit: 2021-10-23 | Discharge: 2021-10-23 | Disposition: A | Discharge status: Home / Self Care | Payer: Medicare Other | Attending: Emergency Medicine | Admitting: Emergency Medicine

## 2021-10-23 DIAGNOSIS — Z79899 Other long term (current) drug therapy: Secondary | ICD-10-CM | POA: Insufficient documentation

## 2021-10-23 DIAGNOSIS — R140 Abdominal distension (gaseous): Secondary | ICD-10-CM

## 2021-10-23 DIAGNOSIS — I120 Hypertensive chronic kidney disease with stage 5 chronic kidney disease or end stage renal disease: Secondary | ICD-10-CM | POA: Insufficient documentation

## 2021-10-23 DIAGNOSIS — R109 Unspecified abdominal pain: Secondary | ICD-10-CM | POA: Diagnosis not present

## 2021-10-23 DIAGNOSIS — R079 Chest pain, unspecified: Secondary | ICD-10-CM | POA: Diagnosis not present

## 2021-10-23 DIAGNOSIS — Z743 Need for continuous supervision: Secondary | ICD-10-CM | POA: Diagnosis not present

## 2021-10-23 DIAGNOSIS — N186 End stage renal disease: Secondary | ICD-10-CM | POA: Diagnosis not present

## 2021-10-23 LAB — CBC WITH DIFFERENTIAL/PLATELET
Abs Immature Granulocytes: 0.04 10*3/uL (ref 0.00–0.07)
Basophils Absolute: 0.1 10*3/uL (ref 0.0–0.1)
Basophils Relative: 1 %
Eosinophils Absolute: 0.3 10*3/uL (ref 0.0–0.5)
Eosinophils Relative: 5 %
HCT: 31.9 % — ABNORMAL LOW (ref 39.0–52.0)
Hemoglobin: 10.4 g/dL — ABNORMAL LOW (ref 13.0–17.0)
Immature Granulocytes: 1 %
Lymphocytes Relative: 22 %
Lymphs Abs: 1.6 10*3/uL (ref 0.7–4.0)
MCH: 31.3 pg (ref 26.0–34.0)
MCHC: 32.6 g/dL (ref 30.0–36.0)
MCV: 96.1 fL (ref 80.0–100.0)
Monocytes Absolute: 0.7 10*3/uL (ref 0.1–1.0)
Monocytes Relative: 10 %
Neutro Abs: 4.5 10*3/uL (ref 1.7–7.7)
Neutrophils Relative %: 61 %
Platelets: 198 10*3/uL (ref 150–400)
RBC: 3.32 MIL/uL — ABNORMAL LOW (ref 4.22–5.81)
RDW: 15.2 % (ref 11.5–15.5)
WBC: 7.3 10*3/uL (ref 4.0–10.5)
nRBC: 0 % (ref 0.0–0.2)

## 2021-10-23 LAB — COMPREHENSIVE METABOLIC PANEL
ALT: 28 U/L (ref 0–44)
AST: 16 U/L (ref 15–41)
Albumin: 4.3 g/dL (ref 3.5–5.0)
Alkaline Phosphatase: 84 U/L (ref 38–126)
Anion gap: 14 (ref 5–15)
BUN: 42 mg/dL — ABNORMAL HIGH (ref 6–20)
CO2: 30 mmol/L (ref 22–32)
Calcium: 10 mg/dL (ref 8.9–10.3)
Chloride: 94 mmol/L — ABNORMAL LOW (ref 98–111)
Creatinine, Ser: 9.39 mg/dL — ABNORMAL HIGH (ref 0.61–1.24)
GFR, Estimated: 7 mL/min — ABNORMAL LOW (ref >60)
Glucose, Bld: 110 mg/dL — ABNORMAL HIGH (ref 70–99)
Potassium: 4.3 mmol/L (ref 3.5–5.1)
Sodium: 138 mmol/L (ref 135–145)
Total Bilirubin: 1 mg/dL (ref 0.3–1.2)
Total Protein: 7.8 g/dL (ref 6.5–8.1)

## 2021-10-23 LAB — LIPASE, BLOOD: Lipase: 39 U/L (ref 11–51)

## 2021-10-23 MED ORDER — IOHEXOL 350 MG/ML SOLN
50.0000 mL | Freq: Once | INTRAVENOUS | Status: AC | PRN
  Administered 2021-10-23: 50 mL via INTRAVENOUS

## 2021-10-23 NOTE — ED Provider Triage Note (Signed)
Emergency Medicine Provider Triage Evaluation Note  Samuel Burns , a 36 y.o. male  was evaluated in triage.  Pt complains of abd pain. Report acute onset of pain from chest to abdomen to back and down his legs which started an hr ago.  Pt sts it felt like a balloon trying to pop.  Denies fever, chills, sob, cough.  Is a MWF dialysis pt and make very little urine  Review of Systems  Positive: As above Negative: As above  Physical Exam  BP 116/78 (BP Location: Left Arm)   Pulse 97   Temp 98.4 F (36.9 C) (Oral)   Resp 18   SpO2 100%  Gen:   Awake, no distress   Resp:  Normal effort  MSK:   Moves extremities without difficulty  Other:    Medical Decision Making  Medically screening exam initiated at 1:13 AM.  Appropriate orders placed.  Samuel Burns was informed that the remainder of the evaluation will be completed by another provider, this initial triage assessment does not replace that evaluation, and the importance of remaining in the ED until their evaluation is complete.  Dissection study   Samuel Moras, PA-C 10/23/21 0119

## 2021-10-23 NOTE — ED Notes (Signed)
ED Provider at bedside. 

## 2021-10-23 NOTE — ED Notes (Signed)
Patient was complaining of not being able to walk, however he was able to walk around the room and walked with out difficulty to wheelchair.

## 2021-10-23 NOTE — ED Provider Notes (Signed)
[facility] EMERGENCY DEPARTMENT Provider Note   CSN: [id number] Arrival date & time: 10/23/21  0110     History  Chief Complaint  Patient presents with   Abdominal Pain    Samuel Burns is a 36 y.o. male.  Patient is a 36 year old male with a past medical history of hypertension, ESRD on MWF HD with right upper extremity fistula presenting to the emergency department with abdominal pain.  The patient states that he ate cereal around 11:30 PM last night.  He states that shortly after he started to feel an intense pain throughout his entire thorax, worse around his abdomen.  He states it felt like a balloon trying to pop.  He states that the pain has been constant since it started.  He denies any associated fevers or chills, nausea or vomiting, chest pain or shortness of breath, diarrhea or constipation.  He states that he urinates a few times a day and has had no dysuria or hematuria.  He states he has never had pain like this before.  He denies any prior abdominal surgery.   Abdominal Pain      Home Medications Prior to Admission medications   Medication Sig Start Date End Date Taking? Authorizing Provider  acetaminophen (TYLENOL) 500 MG tablet Take 1,000 mg by mouth every 6 (six) hours as needed for moderate pain.    [provider]  amLODipine (NORVASC) 10 MG tablet TAKE 1 TABLET BY MOUTH EVERY DAY Patient taking differently: Take 10 mg by mouth at bedtime. 05/23/20 05/23/21  Ladell Pier, MD  cloNIDine (CATAPRES) 0.1 MG tablet TAKE 1 TABLET (0.1 MG TOTAL) BY MOUTH 2 (TWO) TIMES DAILY. 12/15/19 02/11/21  Ladell Pier, MD  ELDERBERRY PO Take 1 capsule by mouth daily.    [provider]  ferric citrate (AURYXIA) 1 GM 210 MG(Fe) tablet Take 420 mg by mouth See admin instructions. Take 420 mg 3 times daily with meals and each snack    [provider]  fluticasone (FLONASE) 50 MCG/ACT nasal spray Place 2 sprays into both nostrils  daily. 05/13/21   Brunetta Jeans, PA-C  lidocaine-prilocaine (EMLA) cream Apply 1 application topically as needed (access fistula).    [provider]  losartan (COZAAR) 50 MG tablet Take 50 mg by mouth daily.    [provider]  MELATONIN PO Take 1-2 capsules by mouth at bedtime as needed (sleep).    [provider]  Metoprolol Tartrate 75 MG TABS TAKE 1 TABLET BY MOUTH TWICE DAILY. 04/22/21 04/22/22  Ladell Pier, MD  Multiple Vitamin (MULTIVITAMIN WITH MINERALS) TABS tablet Take 1 tablet by mouth daily.    [provider]  Naphazoline HCl (CLEAR EYES OP) Place 1 drop into both eyes daily as needed (irritation).    [provider]  sertraline (ZOLOFT) 100 MG tablet Take 100 mg by mouth daily. 04/15/21   [provider]  sevelamer carbonate (RENVELA) 800 MG tablet Take 1 tablet (800 mg total) by mouth 3 (three) times daily with meals. 12/15/19   Ladell Pier, MD  sildenafil (VIAGRA) 50 MG tablet 1 tab PO 1/2 hr prior to sex PRN.  Limit use to 1 tab/24 hr 07/30/20   Ladell Pier, MD      Allergies    Patient has no known allergies.    Review of Systems   Review of Systems  Gastrointestinal:  Positive for abdominal pain.    Physical Exam Updated Vital Signs BP Marland Kitchen)  97/59   Pulse 70   Temp 97.8 F (36.6 C) (Oral)   Resp 17   Ht 5\' 7"  (1.702 m)   Wt 75.3 kg   SpO2 100%   BMI 26.00 kg/m  Physical Exam Vitals and nursing note reviewed.  Constitutional:      General: He is not in acute distress.    Appearance: He is well-developed.     Comments: Mildly uncomfortable appearing  HENT:     Head: Normocephalic and atraumatic.     Mouth/Throat:     Mouth: Mucous membranes are moist.     Pharynx: Oropharynx is clear.  Eyes:     Extraocular Movements: Extraocular movements intact.  Cardiovascular:     Rate and Rhythm: Normal rate and regular rhythm.     Heart sounds: Normal heart sounds.     Comments: Equal pulses  in all 4 extremities Pulmonary:     Effort: Pulmonary effort is normal.     Breath sounds: Normal breath sounds.  Abdominal:     General: Abdomen is flat.     Palpations: Abdomen is soft.     Tenderness: There is no abdominal tenderness. There is no right CVA tenderness or left CVA tenderness.     Hernia: No hernia is present.  Skin:    General: Skin is warm and dry.     Comments: Right upper extremity fistula with palpable thrill  Neurological:     General: No focal deficit present.     Mental Status: He is alert and oriented to person, place, and time.  Psychiatric:        Mood and Affect: Mood normal.        Behavior: Behavior normal.     ED Results / Procedures / Treatments   Labs (all labs ordered are listed, but only abnormal results are displayed) Labs Reviewed  CBC WITH DIFFERENTIAL/PLATELET - Abnormal; Notable for the following components:      Result Value   RBC 3.32 (*)    Hemoglobin 10.4 (*)    HCT 31.9 (*)    All other components within normal limits  COMPREHENSIVE METABOLIC PANEL - Abnormal; Notable for the following components:   Chloride 94 (*)    Glucose, Bld 110 (*)    BUN 42 (*)    Creatinine, Ser 9.39 (*)    GFR, Estimated 7 (*)    All other components within normal limits  LIPASE, BLOOD  URINALYSIS, ROUTINE W REFLEX MICROSCOPIC    EKG None  Radiology No results found.  Procedures Procedures    Medications Ordered in ED Medications - No data to display  ED Course/ Medical Decision Making/ A&P Clinical Course as of 10/23/21 1024  Thu Oct 23, 2021  1022 Upon reassessment, the patient's CT angio shows no explanation as a cause of his symptoms.  The patient states that he still feels like "a balloon that is going to pop" but states that it is more of a fullness sensation rather than any type of pain.  Patient's electrolytes are all within normal range without any signs of severe dehydration.  He will be ambulated throughout the room but is  stable for discharge home and was recommended to follow-up with his doctor and nephrologist for reassessment of his symptoms.  He was given strict return precautions. [VK]    Clinical Course User Index [VK] Ottie Glazier, DO  Medical Decision Making This patient presents to the ED with chief complaint(s) of abdominal pain with pertinent past medical history of ESRD, hypertension which further complicates the presenting complaint. The complaint involves an extensive differential diagnosis and also carries with it a high risk of complications and morbidity.    The differential diagnosis includes pancreatitis, hepatitis, nephrolithiasis, pyelonephritis or UTI less likely as the patient has no CVA tenderness or urinary symptoms, SBO less likely as the patient is having normal bowel movements and no vomiting, possible dissection with the pain radiating from his abdomen to his back  Additional history obtained: Additional history obtained from N/S Records reviewed Primary Care Documents  ED Course and Reassessment: Patient was initially evaluated by provider in triage and had labs performed which show no signs of pancreatitis, hepatitis or explanation as a cause of his pain.  Patient will additionally have CT dissection study as he does have a lower blood pressure and risk factors with hypertension and pain radiating from his abdomen to his back to evaluate for dissection as well as other intra-abdominal causes of his pain.  The patient is declining any pain medication at this time.  He will be given ice chips.  Independent labs interpretation:  The following labs were independently interpreted: Creatinine at baseline, labs within normal range  Independent visualization of imaging: - I independently visualized the following imaging with scope of interpretation limited to determining acute life threatening conditions related to emergency care: CT dissection study, which  revealed no acute disease  Consultation: - Consulted or discussed management/test interpretation w/ external professional: N/A  Consideration for admission or further workup: Patient has no emergent conditions that require admission or further work-up at this time.  He was recommended outpatient follow-up and was given strict return precautions Social Determinants of health: N/A             Final Clinical Impression(s) / ED Diagnoses Final diagnoses:  None    Rx / DC Orders ED Discharge Orders     None         Ottie Glazier, DO 10/23/21 1027

## 2021-10-23 NOTE — ED Triage Notes (Signed)
Pt brought to ED by [facility] with initial c/o "chills", but while in transit pt began complaining of severe abdominal pain. States he feels that he is about to explode. Pt is a M-W-F hemodialysis pt. Last treatment was Wednesday and pt received full session with no problems.    EMS Vitals BP 138/88 HR 110 SPO2 97% RA

## 2021-10-23 NOTE — ED Notes (Signed)
IV attempt successful. CT called and notified. States will be coming for patient next.

## 2021-10-23 NOTE — Discharge Instructions (Addendum)
You were seen in the emergency department for your bloating sort of discomfort in your body.  Your labs showed no signs of severe dehydration or electrolyte abnormality requiring emergent dialysis.  A CAT scan was performed which showed no signs of abnormality of the main blood vessel that goes through your body and no signs of any infections or obstructions in your abdomen or chest.  It is unclear what is causing your symptoms at this time but you should follow-up with your primary doctor and nephrologist for further work-up.  You should return to the emergency department if your pain gets significantly worse, you have repetitive vomiting, you have fevers, you pass out or if you have any other new or concerning symptoms.

## 2021-10-23 NOTE — ED Notes (Signed)
Pt unable to get CT due to CT tech stating they were unable to get IV after several attempts. Nurse notified.

## 2021-10-24 DIAGNOSIS — Z992 Dependence on renal dialysis: Secondary | ICD-10-CM | POA: Diagnosis not present

## 2021-10-24 DIAGNOSIS — N186 End stage renal disease: Secondary | ICD-10-CM | POA: Diagnosis not present

## 2021-10-24 DIAGNOSIS — N2581 Secondary hyperparathyroidism of renal origin: Secondary | ICD-10-CM | POA: Diagnosis not present

## 2021-10-24 DIAGNOSIS — D631 Anemia in chronic kidney disease: Secondary | ICD-10-CM | POA: Diagnosis not present

## 2021-10-24 DIAGNOSIS — D689 Coagulation defect, unspecified: Secondary | ICD-10-CM | POA: Diagnosis not present

## 2021-10-27 DIAGNOSIS — N2581 Secondary hyperparathyroidism of renal origin: Secondary | ICD-10-CM | POA: Diagnosis not present

## 2021-10-27 DIAGNOSIS — Z992 Dependence on renal dialysis: Secondary | ICD-10-CM | POA: Diagnosis not present

## 2021-10-27 DIAGNOSIS — N186 End stage renal disease: Secondary | ICD-10-CM | POA: Diagnosis not present

## 2021-10-27 DIAGNOSIS — D689 Coagulation defect, unspecified: Secondary | ICD-10-CM | POA: Diagnosis not present

## 2021-10-27 DIAGNOSIS — D631 Anemia in chronic kidney disease: Secondary | ICD-10-CM | POA: Diagnosis not present

## 2021-10-29 DIAGNOSIS — D631 Anemia in chronic kidney disease: Secondary | ICD-10-CM | POA: Diagnosis not present

## 2021-10-29 DIAGNOSIS — N2581 Secondary hyperparathyroidism of renal origin: Secondary | ICD-10-CM | POA: Diagnosis not present

## 2021-10-29 DIAGNOSIS — N186 End stage renal disease: Secondary | ICD-10-CM | POA: Diagnosis not present

## 2021-10-29 DIAGNOSIS — D689 Coagulation defect, unspecified: Secondary | ICD-10-CM | POA: Diagnosis not present

## 2021-10-29 DIAGNOSIS — Z992 Dependence on renal dialysis: Secondary | ICD-10-CM | POA: Diagnosis not present

## 2021-10-31 DIAGNOSIS — N186 End stage renal disease: Secondary | ICD-10-CM | POA: Diagnosis not present

## 2021-10-31 DIAGNOSIS — D631 Anemia in chronic kidney disease: Secondary | ICD-10-CM | POA: Diagnosis not present

## 2021-10-31 DIAGNOSIS — Z992 Dependence on renal dialysis: Secondary | ICD-10-CM | POA: Diagnosis not present

## 2021-10-31 DIAGNOSIS — D689 Coagulation defect, unspecified: Secondary | ICD-10-CM | POA: Diagnosis not present

## 2021-10-31 DIAGNOSIS — N2581 Secondary hyperparathyroidism of renal origin: Secondary | ICD-10-CM | POA: Diagnosis not present

## 2021-11-03 DIAGNOSIS — N2581 Secondary hyperparathyroidism of renal origin: Secondary | ICD-10-CM | POA: Diagnosis not present

## 2021-11-03 DIAGNOSIS — Z992 Dependence on renal dialysis: Secondary | ICD-10-CM | POA: Diagnosis not present

## 2021-11-03 DIAGNOSIS — N186 End stage renal disease: Secondary | ICD-10-CM | POA: Diagnosis not present

## 2021-11-03 DIAGNOSIS — D689 Coagulation defect, unspecified: Secondary | ICD-10-CM | POA: Diagnosis not present

## 2021-11-03 DIAGNOSIS — D631 Anemia in chronic kidney disease: Secondary | ICD-10-CM | POA: Diagnosis not present

## 2021-11-05 DIAGNOSIS — D689 Coagulation defect, unspecified: Secondary | ICD-10-CM | POA: Diagnosis not present

## 2021-11-05 DIAGNOSIS — Z992 Dependence on renal dialysis: Secondary | ICD-10-CM | POA: Diagnosis not present

## 2021-11-05 DIAGNOSIS — D631 Anemia in chronic kidney disease: Secondary | ICD-10-CM | POA: Diagnosis not present

## 2021-11-05 DIAGNOSIS — N186 End stage renal disease: Secondary | ICD-10-CM | POA: Diagnosis not present

## 2021-11-05 DIAGNOSIS — N2581 Secondary hyperparathyroidism of renal origin: Secondary | ICD-10-CM | POA: Diagnosis not present

## 2021-11-07 DIAGNOSIS — N186 End stage renal disease: Secondary | ICD-10-CM | POA: Diagnosis not present

## 2021-11-07 DIAGNOSIS — D689 Coagulation defect, unspecified: Secondary | ICD-10-CM | POA: Diagnosis not present

## 2021-11-07 DIAGNOSIS — N2581 Secondary hyperparathyroidism of renal origin: Secondary | ICD-10-CM | POA: Diagnosis not present

## 2021-11-07 DIAGNOSIS — D631 Anemia in chronic kidney disease: Secondary | ICD-10-CM | POA: Diagnosis not present

## 2021-11-07 DIAGNOSIS — Z992 Dependence on renal dialysis: Secondary | ICD-10-CM | POA: Diagnosis not present

## 2021-11-10 DIAGNOSIS — D631 Anemia in chronic kidney disease: Secondary | ICD-10-CM | POA: Diagnosis not present

## 2021-11-10 DIAGNOSIS — N2581 Secondary hyperparathyroidism of renal origin: Secondary | ICD-10-CM | POA: Diagnosis not present

## 2021-11-10 DIAGNOSIS — N186 End stage renal disease: Secondary | ICD-10-CM | POA: Diagnosis not present

## 2021-11-10 DIAGNOSIS — Z992 Dependence on renal dialysis: Secondary | ICD-10-CM | POA: Diagnosis not present

## 2021-11-10 DIAGNOSIS — D689 Coagulation defect, unspecified: Secondary | ICD-10-CM | POA: Diagnosis not present

## 2021-11-12 DIAGNOSIS — D631 Anemia in chronic kidney disease: Secondary | ICD-10-CM | POA: Diagnosis not present

## 2021-11-12 DIAGNOSIS — N186 End stage renal disease: Secondary | ICD-10-CM | POA: Diagnosis not present

## 2021-11-12 DIAGNOSIS — Z992 Dependence on renal dialysis: Secondary | ICD-10-CM | POA: Diagnosis not present

## 2021-11-12 DIAGNOSIS — N2581 Secondary hyperparathyroidism of renal origin: Secondary | ICD-10-CM | POA: Diagnosis not present

## 2021-11-12 DIAGNOSIS — D689 Coagulation defect, unspecified: Secondary | ICD-10-CM | POA: Diagnosis not present

## 2021-11-14 DIAGNOSIS — Z992 Dependence on renal dialysis: Secondary | ICD-10-CM | POA: Diagnosis not present

## 2021-11-14 DIAGNOSIS — D689 Coagulation defect, unspecified: Secondary | ICD-10-CM | POA: Diagnosis not present

## 2021-11-14 DIAGNOSIS — N2581 Secondary hyperparathyroidism of renal origin: Secondary | ICD-10-CM | POA: Diagnosis not present

## 2021-11-14 DIAGNOSIS — N186 End stage renal disease: Secondary | ICD-10-CM | POA: Diagnosis not present

## 2021-11-14 DIAGNOSIS — D631 Anemia in chronic kidney disease: Secondary | ICD-10-CM | POA: Diagnosis not present

## 2021-11-15 DIAGNOSIS — Z992 Dependence on renal dialysis: Secondary | ICD-10-CM | POA: Diagnosis not present

## 2021-11-15 DIAGNOSIS — N186 End stage renal disease: Secondary | ICD-10-CM | POA: Diagnosis not present

## 2021-11-15 DIAGNOSIS — I129 Hypertensive chronic kidney disease with stage 1 through stage 4 chronic kidney disease, or unspecified chronic kidney disease: Secondary | ICD-10-CM | POA: Diagnosis not present

## 2021-11-17 DIAGNOSIS — N2581 Secondary hyperparathyroidism of renal origin: Secondary | ICD-10-CM | POA: Diagnosis not present

## 2021-11-17 DIAGNOSIS — D631 Anemia in chronic kidney disease: Secondary | ICD-10-CM | POA: Diagnosis not present

## 2021-11-17 DIAGNOSIS — Z4800 Encounter for change or removal of nonsurgical wound dressing: Secondary | ICD-10-CM | POA: Diagnosis not present

## 2021-11-17 DIAGNOSIS — D689 Coagulation defect, unspecified: Secondary | ICD-10-CM | POA: Diagnosis not present

## 2021-11-17 DIAGNOSIS — Z992 Dependence on renal dialysis: Secondary | ICD-10-CM | POA: Diagnosis not present

## 2021-11-17 DIAGNOSIS — N186 End stage renal disease: Secondary | ICD-10-CM | POA: Diagnosis not present

## 2021-11-17 DIAGNOSIS — Z23 Encounter for immunization: Secondary | ICD-10-CM | POA: Diagnosis not present

## 2021-11-17 DIAGNOSIS — R52 Pain, unspecified: Secondary | ICD-10-CM | POA: Diagnosis not present

## 2021-11-19 ENCOUNTER — Other Ambulatory Visit (HOSPITAL_COMMUNITY): Payer: Self-pay | Admitting: Nephrology

## 2021-11-19 DIAGNOSIS — N186 End stage renal disease: Secondary | ICD-10-CM

## 2021-11-20 ENCOUNTER — Other Ambulatory Visit: Payer: Self-pay | Admitting: Radiology

## 2021-11-20 ENCOUNTER — Other Ambulatory Visit: Payer: Self-pay | Admitting: Student

## 2021-11-21 ENCOUNTER — Other Ambulatory Visit: Payer: Self-pay

## 2021-11-21 ENCOUNTER — Other Ambulatory Visit (HOSPITAL_COMMUNITY): Payer: Self-pay | Admitting: Nephrology

## 2021-11-21 ENCOUNTER — Telehealth: Payer: Self-pay | Admitting: *Deleted

## 2021-11-21 ENCOUNTER — Encounter: Payer: Self-pay | Admitting: Internal Medicine

## 2021-11-21 ENCOUNTER — Ambulatory Visit (HOSPITAL_COMMUNITY)
Admission: RE | Admit: 2021-11-21 | Discharge: 2021-11-21 | Disposition: A | Discharge status: Home / Self Care | Payer: Medicare Other | Source: Ambulatory Visit | Attending: Nephrology | Admitting: Nephrology

## 2021-11-21 VITALS — BP 142/91 | HR 86 | Temp 97.7°F | Resp 12 | Ht 67.0 in | Wt 179.0 lb

## 2021-11-21 DIAGNOSIS — Z992 Dependence on renal dialysis: Secondary | ICD-10-CM | POA: Diagnosis not present

## 2021-11-21 DIAGNOSIS — F1290 Cannabis use, unspecified, uncomplicated: Secondary | ICD-10-CM | POA: Diagnosis not present

## 2021-11-21 DIAGNOSIS — Z4901 Encounter for fitting and adjustment of extracorporeal dialysis catheter: Secondary | ICD-10-CM | POA: Diagnosis not present

## 2021-11-21 DIAGNOSIS — N186 End stage renal disease: Secondary | ICD-10-CM | POA: Diagnosis not present

## 2021-11-21 HISTORY — PX: IR US GUIDE VASC ACCESS LEFT: IMG2389

## 2021-11-21 HISTORY — PX: IR FLUORO GUIDE CV LINE LEFT: IMG2282

## 2021-11-21 MED ORDER — CEFAZOLIN SODIUM-DEXTROSE 2-4 GM/100ML-% IV SOLN
INTRAVENOUS | Status: AC | PRN
  Administered 2021-11-21: 2 g via INTRAVENOUS

## 2021-11-21 MED ORDER — MIDAZOLAM HCL 2 MG/2ML IJ SOLN
INTRAMUSCULAR | Status: AC | PRN
  Administered 2021-11-21: .5 mg via INTRAVENOUS
  Administered 2021-11-21: 1 mg via INTRAVENOUS

## 2021-11-21 MED ORDER — SODIUM CHLORIDE 0.9 % IV SOLN: INTRAVENOUS | Status: DC

## 2021-11-21 MED ORDER — MIDAZOLAM HCL 2 MG/2ML IJ SOLN
INTRAMUSCULAR | Status: AC
  Filled 2021-11-21: qty 2

## 2021-11-21 MED ORDER — CEFAZOLIN SODIUM-DEXTROSE 2-4 GM/100ML-% IV SOLN
INTRAVENOUS | Status: AC
  Filled 2021-11-21: qty 100

## 2021-11-21 MED ORDER — HEPARIN SODIUM (PORCINE) 1000 UNIT/ML IJ SOLN
INTRAMUSCULAR | Status: AC
  Administered 2021-11-21: 3800 [IU]
  Filled 2021-11-21: qty 10

## 2021-11-21 MED ORDER — FENTANYL CITRATE (PF) 100 MCG/2ML IJ SOLN
INTRAMUSCULAR | Status: AC
  Filled 2021-11-21: qty 2

## 2021-11-21 MED ORDER — LIDOCAINE-EPINEPHRINE 1 %-1:100000 IJ SOLN
INTRAMUSCULAR | Status: AC
  Administered 2021-11-21: 18 mL
  Filled 2021-11-21: qty 1

## 2021-11-21 MED ORDER — GELATIN ABSORBABLE 12-7 MM EX MISC
CUTANEOUS | Status: AC
  Filled 2021-11-21: qty 1

## 2021-11-21 MED ORDER — FENTANYL CITRATE (PF) 100 MCG/2ML IJ SOLN
INTRAMUSCULAR | Status: AC | PRN
  Administered 2021-11-21: 50 ug via INTRAVENOUS
  Administered 2021-11-21: 25 ug via INTRAVENOUS

## 2021-11-21 NOTE — Procedures (Signed)
Interventional Radiology Procedure:   Indications: Thrombosed right arm AV fistula and needs new dialysis access.  Procedure: Placement of tunneled dialysis catheter  Findings: Left jugular Palindrome catheter, tip at SVC/RA junction.  Complications: No immediate complications noted.     EBL: Minimal  Plan: Catheter is ready to use.    Carla Rashad R. Anselm Pancoast, MD  Pager: [phone]

## 2021-11-21 NOTE — H&P (Signed)
Chief Complaint: Patient was seen in consultation today for malfunctioning AVF  at the request of Rosita Fire  Referring Physician(s): Rosita Fire  Supervising Physician: Markus Daft  Patient Status: [facility] - Out-pt  History of Present Illness: Samuel Burns is a 36 y.o. male ESRD on dialysis (MWF) who presents with malfunctioning fistula.  He reports he had regular dialysis on Monday and noticed pain and swelling of his arm on Tuesday.  When we went to dialysis on Wednesday, the center was unable to get any blood.  The arm continues to be swollen and tender.  He was referred to IR for declot vs.  Insertion of TDC.  Past Medical History:  Diagnosis Date   Asthma    Renal failure     Past Surgical History:  Procedure Laterality Date   AV FISTULA PLACEMENT Right 11/24/2019   Procedure: Creation of RIGHT ARM Brachial/Cephalic ARTERIOVENOUS (AV) FISTULA.;  Surgeon: [name], MD;  Location: [place];  Service: Vascular;  Laterality: Right;   IR AV DIALY SHUNT INTRO NEEDLE/INTRACATH INITIAL W/PTA/IMG RIGHT Right 02/11/2021   IR DIALY SHUNT INTRO NEEDLE/INTRACATH INITIAL W/IMG RIGHT Right 04/30/2020   IR FLUORO GUIDE CV LINE RIGHT  11/20/2019   IR REMOVAL TUN CV CATH W/O FL  06/04/2020   IR US GUIDE VASC ACCESS RIGHT  11/20/2019   IR US GUIDE VASC ACCESS RIGHT  04/30/2020   IR US GUIDE VASC ACCESS RIGHT  02/11/2021    Allergies: Patient has no known allergies.  Medications: Prior to Admission medications   Medication Sig Start Date End Date Taking? Authorizing Provider  acetaminophen (TYLENOL) 500 MG tablet Take 1,000 mg by mouth every 6 (six) hours as needed for moderate pain.   Yes [provider]  amLODipine (NORVASC) 10 MG tablet Take 10 mg by mouth at bedtime.   Yes [provider]  buPROPion (WELLBUTRIN XL) 150 MG 24 hr tablet Take 150 mg by mouth daily. 09/26/21  Yes [provider]  cloNIDine (CATAPRES) 0.1 MG tablet Take  0.1 mg by mouth 2 (two) times daily.   Yes [provider]  fluticasone (FLONASE) 50 MCG/ACT nasal spray Place 2 sprays into both nostrils daily. 05/13/21  Yes Brunetta Jeans, PA-C  hydrOXYzine (VISTARIL) 25 MG capsule Take 25 mg by mouth 3 (three) times daily as needed for anxiety.   Yes [provider]  lidocaine-prilocaine (EMLA) cream Apply 1 application topically as needed (access fistula).   Yes [provider]  losartan (COZAAR) 50 MG tablet Take 50 mg by mouth daily.   Yes [provider]  MELATONIN PO Take 1-2 capsules by mouth at bedtime as needed (sleep).   Yes [provider]  Metoprolol Tartrate 75 MG TABS TAKE 1 TABLET BY MOUTH TWICE DAILY. 04/22/21 04/22/22 Yes Ladell Pier, MD  Multiple Vitamin (MULTIVITAMIN WITH MINERALS) TABS tablet Take 1 tablet by mouth daily.   Yes [provider]  Naphazoline HCl (CLEAR EYES OP) Place 1 drop into both eyes daily as needed (irritation).   Yes [provider]  sertraline (ZOLOFT) 100 MG tablet Take 100 mg by mouth daily. 04/15/21  Yes [provider]  sucroferric oxyhydroxide (VELPHORO) 500 MG chewable tablet Chew 500-1,000 mg by mouth See admin instructions. 1000 mg with 3 times daily meals, 500 mg with snacks 10/22/21  Yes [provider]  sevelamer carbonate (RENVELA) 800 MG tablet Take 1 tablet (800 mg total) by mouth 3 (three) times daily with meals. Patient not  taking: Reported on 11/20/2021 12/15/19   Ladell Pier, MD  sildenafil (VIAGRA) 50 MG tablet 1 tab PO 1/2 hr prior to sex PRN.  Limit use to 1 tab/24 hr 07/30/20   Ladell Pier, MD     Family History  Problem Relation Age of Onset   Hypertension Maternal Grandmother     Social History   Socioeconomic History   Marital status: Significant Other    Spouse name: Not on file   Number of children: Not on file   Years of education: Not on file   Highest education level: Not on file   Occupational History   Not on file  Tobacco Use   Smoking status: Never   Smokeless tobacco: Never  Substance and Sexual Activity   Alcohol use: No   Drug use: Yes    Types: Marijuana   Sexual activity: Not on file  Other Topics Concern   Not on file  Social History Narrative   Not on file   Social Determinants of Health   Financial Resource Strain: Not on file  Food Insecurity: Not on file  Transportation Needs: Not on file  Physical Activity: Not on file  Stress: Not on file  Social Connections: Not on file    Review of Systems: A 12 point ROS discussed and pertinent positives are indicated in the HPI above.  All other systems are negative.  Vital Signs: BP 119/81   Pulse 75   Temp 97.7 F (36.5 C) (Temporal)   Resp 16   Ht 5\' 7"  (1.702 m)   Wt 179 lb (81.2 kg)   SpO2 96%   BMI 28.04 kg/m   Physical Exam Vitals reviewed.  Constitutional:      General: He is not in acute distress. Cardiovascular:     Rate and Rhythm: Normal rate.     Pulses: Normal pulses.     Comments: Right arm over fistula is red, swollen, and tender. Pulmonary:     Effort: Pulmonary effort is normal. No respiratory distress.  Neurological:     General: No focal deficit present.     Mental Status: He is alert and oriented to person, place, and time.     Imaging: CT Angio Chest/Abd/Pel for Dissection W and/or Wo Contrast  Result Date: 10/23/2021 CLINICAL DATA:  Acute onset chest and back and abdominal pain. EXAM: CT ANGIOGRAPHY CHEST, ABDOMEN AND PELVIS TECHNIQUE: Non-contrast CT of the chest was initially obtained. Multidetector CT imaging through the chest, abdomen and pelvis was performed using the standard protocol during bolus administration of intravenous contrast. Multiplanar reconstructed images and MIPs were obtained and reviewed to evaluate the vascular anatomy. RADIATION DOSE REDUCTION: This exam was performed according to the departmental dose-optimization program which  includes automated exposure control, adjustment of the mA and/or kV according to patient size and/or use of iterative reconstruction technique. CONTRAST:  6mL OMNIPAQUE IOHEXOL 350 MG/ML SOLN COMPARISON:  CT scan 11/19/2019 FINDINGS: CTA CHEST FINDINGS Cardiovascular: The heart is normal in size. No pericardial effusion. The aorta is normal in caliber. No dissection or atherosclerotic calcification. The branch vessels are patent. Mediastinum/Nodes: No mediastinal or hilar mass or adenopathy. The esophagus is grossly normal. Lungs/Pleura: No acute pulmonary findings. No pulmonary lesions or pleural effusions. No pneumothorax. Musculoskeletal: The bony thorax is intact. Review of the MIP images confirms the above findings. CTA ABDOMEN AND PELVIS FINDINGS VASCULAR Aorta: Normal Celiac: Normal SMA: Normal Renals: Normal IMA: Normal Inflow: Normal Veins: Normal Review of the MIP images  confirms the above findings. NON-VASCULAR Hepatobiliary: No hepatic lesions or intrahepatic biliary dilatation. The gallbladder is unremarkable. No common bile duct dilatation. Pancreas: No mass, inflammation or ductal dilatation. Spleen: Normal size. No focal lesions. Adrenals/Urinary Tract: Adrenal glands and kidneys are unremarkable. The bladder is normal. Stomach/Bowel: The stomach, duodenum, small bowel and colon are grossly normal without oral contrast. No inflammatory changes, mass lesions or obstructive findings. The appendix is normal. Lymphatic: No abdominal or pelvic lymphadenopathy. Reproductive: The prostate gland and seminal vesicles are unremarkable. Other: No pelvic mass or adenopathy. No free pelvic fluid collections. No inguinal mass or adenopathy. No abdominal wall hernia or subcutaneous lesions. Musculoskeletal: No significant bony findings. Review of the MIP images confirms the above findings. IMPRESSION: 1. Normal CT angiogram of the chest, abdomen and pelvis. No aneurysm or dissection. 2. No acute pulmonary  findings. 3. No acute abdominal/pelvic findings. Electronically Signed   By: Marijo Sanes M.D.   On: 10/23/2021 09:05    Labs:  CBC: Recent Labs    10/23/21 0129  WBC 7.3  HGB 10.4*  HCT 31.9*  PLT 198    COAGS: No results for input(s): "INR", "APTT" in the last 8760 hours.  BMP: Recent Labs    10/23/21 0129  NA 138  K 4.3  CL 94*  CO2 30  GLUCOSE 110*  BUN 42*  CALCIUM 10.0  CREATININE 9.39*  GFRNONAA 7*    LIVER FUNCTION TESTS: Recent Labs    10/23/21 0129  BILITOT 1.0  AST 16  ALT 28  ALKPHOS 84  PROT 7.8  ALBUMIN 4.3    Assessment and Plan:  Right upper extremity fistula clotted --missed dialysis Wednesday  --presents for declot vs TDC --[place] scan prior to case shows significant clot burden extending the length of the upper arm.  Unable to salvage in IR.  Opting for [facility] placement today. Recommend f/u with vascular for revision vs new access.  Risks and benefits discussed with the patient including, but not limited to bleeding, infection, vascular injury, pulmonary embolism, need for tunneled HD catheter placement or even death.  All of the patient's questions were answered, patient is agreeable to proceed. Consent signed and in chart.   Thank you for this interesting consult.  I greatly enjoyed meeting Samuel Burns and look forward to participating in their care.  A copy of this report was sent to the requesting provider on this date.  Electronically Signed: Pasty Spillers, PA 11/21/2021, 12:49 PM   I spent a total of  25 Minutes in face to face in clinical consultation, greater than 50% of which was counseling/coordinating care for clotted RUE fistula

## 2021-11-21 NOTE — Telephone Encounter (Signed)
LVM for pt to RTC. ----DD,RMA  

## 2021-11-21 NOTE — Telephone Encounter (Signed)
Patient is calling to notified provider of finding at Radiology appointment today. Patient states he is having pain in his arm and was told to follow up with PCP regarding findings.  Per report: I personally evaluated the right brachiocephalic fistula with [place].  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  Majority of the upper arm cephalic vein is thrombosed.  Do not feel that a declot procedure will be successful based on large clot burden.  Will proceed with tunneled dialysis catheter.  Patient advised would send message to provider for follow up instructions.

## 2021-11-22 DIAGNOSIS — D631 Anemia in chronic kidney disease: Secondary | ICD-10-CM | POA: Diagnosis not present

## 2021-11-22 DIAGNOSIS — Z23 Encounter for immunization: Secondary | ICD-10-CM | POA: Diagnosis not present

## 2021-11-22 DIAGNOSIS — N2581 Secondary hyperparathyroidism of renal origin: Secondary | ICD-10-CM | POA: Diagnosis not present

## 2021-11-22 DIAGNOSIS — D689 Coagulation defect, unspecified: Secondary | ICD-10-CM | POA: Diagnosis not present

## 2021-11-22 DIAGNOSIS — N186 End stage renal disease: Secondary | ICD-10-CM | POA: Diagnosis not present

## 2021-11-22 DIAGNOSIS — R52 Pain, unspecified: Secondary | ICD-10-CM | POA: Diagnosis not present

## 2021-11-22 DIAGNOSIS — Z992 Dependence on renal dialysis: Secondary | ICD-10-CM | POA: Diagnosis not present

## 2021-11-22 DIAGNOSIS — Z4800 Encounter for change or removal of nonsurgical wound dressing: Secondary | ICD-10-CM | POA: Diagnosis not present

## 2021-11-24 DIAGNOSIS — D631 Anemia in chronic kidney disease: Secondary | ICD-10-CM | POA: Diagnosis not present

## 2021-11-24 DIAGNOSIS — N186 End stage renal disease: Secondary | ICD-10-CM | POA: Diagnosis not present

## 2021-11-24 DIAGNOSIS — Z4800 Encounter for change or removal of nonsurgical wound dressing: Secondary | ICD-10-CM | POA: Diagnosis not present

## 2021-11-24 DIAGNOSIS — Z992 Dependence on renal dialysis: Secondary | ICD-10-CM | POA: Diagnosis not present

## 2021-11-24 DIAGNOSIS — R52 Pain, unspecified: Secondary | ICD-10-CM | POA: Diagnosis not present

## 2021-11-24 DIAGNOSIS — Z23 Encounter for immunization: Secondary | ICD-10-CM | POA: Diagnosis not present

## 2021-11-24 DIAGNOSIS — D689 Coagulation defect, unspecified: Secondary | ICD-10-CM | POA: Diagnosis not present

## 2021-11-24 DIAGNOSIS — N2581 Secondary hyperparathyroidism of renal origin: Secondary | ICD-10-CM | POA: Diagnosis not present

## 2021-11-26 DIAGNOSIS — Z992 Dependence on renal dialysis: Secondary | ICD-10-CM | POA: Diagnosis not present

## 2021-11-26 DIAGNOSIS — Z4800 Encounter for change or removal of nonsurgical wound dressing: Secondary | ICD-10-CM | POA: Diagnosis not present

## 2021-11-26 DIAGNOSIS — N186 End stage renal disease: Secondary | ICD-10-CM | POA: Diagnosis not present

## 2021-11-26 DIAGNOSIS — Z23 Encounter for immunization: Secondary | ICD-10-CM | POA: Diagnosis not present

## 2021-11-26 DIAGNOSIS — D689 Coagulation defect, unspecified: Secondary | ICD-10-CM | POA: Diagnosis not present

## 2021-11-26 DIAGNOSIS — D631 Anemia in chronic kidney disease: Secondary | ICD-10-CM | POA: Diagnosis not present

## 2021-11-26 DIAGNOSIS — R52 Pain, unspecified: Secondary | ICD-10-CM | POA: Diagnosis not present

## 2021-11-26 DIAGNOSIS — N2581 Secondary hyperparathyroidism of renal origin: Secondary | ICD-10-CM | POA: Diagnosis not present

## 2021-11-26 NOTE — Telephone Encounter (Signed)
LVM for pt to RTC. Letter mailed.-----DD,RMA 

## 2021-11-26 NOTE — Telephone Encounter (Signed)
Noted. Pt will be called once results have been viewed by provider.

## 2021-11-26 NOTE — Telephone Encounter (Signed)
Patient returning call.

## 2021-11-26 NOTE — Telephone Encounter (Signed)
Called and spoke with patient in regards to provider's recommendations to go to ER for swelling and clots in arm. Patient verbal understanding. He also would like to know the results of imaging he had completed 11/21/21, they have not been review by provider for release to patient. Please advise patient.

## 2021-11-28 DIAGNOSIS — Z23 Encounter for immunization: Secondary | ICD-10-CM | POA: Diagnosis not present

## 2021-11-28 DIAGNOSIS — N2581 Secondary hyperparathyroidism of renal origin: Secondary | ICD-10-CM | POA: Diagnosis not present

## 2021-11-28 DIAGNOSIS — D631 Anemia in chronic kidney disease: Secondary | ICD-10-CM | POA: Diagnosis not present

## 2021-11-28 DIAGNOSIS — D689 Coagulation defect, unspecified: Secondary | ICD-10-CM | POA: Diagnosis not present

## 2021-11-28 DIAGNOSIS — N186 End stage renal disease: Secondary | ICD-10-CM | POA: Diagnosis not present

## 2021-11-28 DIAGNOSIS — Z4800 Encounter for change or removal of nonsurgical wound dressing: Secondary | ICD-10-CM | POA: Diagnosis not present

## 2021-11-28 DIAGNOSIS — Z992 Dependence on renal dialysis: Secondary | ICD-10-CM | POA: Diagnosis not present

## 2021-11-28 DIAGNOSIS — R52 Pain, unspecified: Secondary | ICD-10-CM | POA: Diagnosis not present

## 2021-12-01 DIAGNOSIS — N186 End stage renal disease: Secondary | ICD-10-CM | POA: Diagnosis not present

## 2021-12-01 DIAGNOSIS — R52 Pain, unspecified: Secondary | ICD-10-CM | POA: Diagnosis not present

## 2021-12-01 DIAGNOSIS — D689 Coagulation defect, unspecified: Secondary | ICD-10-CM | POA: Diagnosis not present

## 2021-12-01 DIAGNOSIS — D631 Anemia in chronic kidney disease: Secondary | ICD-10-CM | POA: Diagnosis not present

## 2021-12-01 DIAGNOSIS — N2581 Secondary hyperparathyroidism of renal origin: Secondary | ICD-10-CM | POA: Diagnosis not present

## 2021-12-01 DIAGNOSIS — Z4800 Encounter for change or removal of nonsurgical wound dressing: Secondary | ICD-10-CM | POA: Diagnosis not present

## 2021-12-01 DIAGNOSIS — Z992 Dependence on renal dialysis: Secondary | ICD-10-CM | POA: Diagnosis not present

## 2021-12-01 DIAGNOSIS — Z23 Encounter for immunization: Secondary | ICD-10-CM | POA: Diagnosis not present

## 2021-12-03 DIAGNOSIS — R52 Pain, unspecified: Secondary | ICD-10-CM | POA: Diagnosis not present

## 2021-12-03 DIAGNOSIS — Z4800 Encounter for change or removal of nonsurgical wound dressing: Secondary | ICD-10-CM | POA: Diagnosis not present

## 2021-12-03 DIAGNOSIS — Z23 Encounter for immunization: Secondary | ICD-10-CM | POA: Diagnosis not present

## 2021-12-03 DIAGNOSIS — N186 End stage renal disease: Secondary | ICD-10-CM | POA: Diagnosis not present

## 2021-12-03 DIAGNOSIS — Z992 Dependence on renal dialysis: Secondary | ICD-10-CM | POA: Diagnosis not present

## 2021-12-03 DIAGNOSIS — N2581 Secondary hyperparathyroidism of renal origin: Secondary | ICD-10-CM | POA: Diagnosis not present

## 2021-12-03 DIAGNOSIS — D631 Anemia in chronic kidney disease: Secondary | ICD-10-CM | POA: Diagnosis not present

## 2021-12-03 DIAGNOSIS — D689 Coagulation defect, unspecified: Secondary | ICD-10-CM | POA: Diagnosis not present

## 2021-12-04 ENCOUNTER — Other Ambulatory Visit: Payer: Self-pay | Admitting: *Deleted

## 2021-12-04 DIAGNOSIS — N186 End stage renal disease: Secondary | ICD-10-CM

## 2021-12-05 DIAGNOSIS — D631 Anemia in chronic kidney disease: Secondary | ICD-10-CM | POA: Diagnosis not present

## 2021-12-05 DIAGNOSIS — D689 Coagulation defect, unspecified: Secondary | ICD-10-CM | POA: Diagnosis not present

## 2021-12-05 DIAGNOSIS — N2581 Secondary hyperparathyroidism of renal origin: Secondary | ICD-10-CM | POA: Diagnosis not present

## 2021-12-05 DIAGNOSIS — Z992 Dependence on renal dialysis: Secondary | ICD-10-CM | POA: Diagnosis not present

## 2021-12-05 DIAGNOSIS — Z23 Encounter for immunization: Secondary | ICD-10-CM | POA: Diagnosis not present

## 2021-12-05 DIAGNOSIS — Z4800 Encounter for change or removal of nonsurgical wound dressing: Secondary | ICD-10-CM | POA: Diagnosis not present

## 2021-12-05 DIAGNOSIS — N186 End stage renal disease: Secondary | ICD-10-CM | POA: Diagnosis not present

## 2021-12-05 DIAGNOSIS — R52 Pain, unspecified: Secondary | ICD-10-CM | POA: Diagnosis not present

## 2021-12-08 DIAGNOSIS — N186 End stage renal disease: Secondary | ICD-10-CM | POA: Diagnosis not present

## 2021-12-08 DIAGNOSIS — D689 Coagulation defect, unspecified: Secondary | ICD-10-CM | POA: Diagnosis not present

## 2021-12-08 DIAGNOSIS — Z23 Encounter for immunization: Secondary | ICD-10-CM | POA: Diagnosis not present

## 2021-12-08 DIAGNOSIS — N2581 Secondary hyperparathyroidism of renal origin: Secondary | ICD-10-CM | POA: Diagnosis not present

## 2021-12-08 DIAGNOSIS — D631 Anemia in chronic kidney disease: Secondary | ICD-10-CM | POA: Diagnosis not present

## 2021-12-08 DIAGNOSIS — Z4800 Encounter for change or removal of nonsurgical wound dressing: Secondary | ICD-10-CM | POA: Diagnosis not present

## 2021-12-08 DIAGNOSIS — R52 Pain, unspecified: Secondary | ICD-10-CM | POA: Diagnosis not present

## 2021-12-08 DIAGNOSIS — Z992 Dependence on renal dialysis: Secondary | ICD-10-CM | POA: Diagnosis not present

## 2021-12-09 ENCOUNTER — Ambulatory Visit: Payer: Medicare Other | Attending: Nurse Practitioner | Admitting: Nurse Practitioner

## 2021-12-09 ENCOUNTER — Encounter: Payer: Self-pay | Admitting: Nurse Practitioner

## 2021-12-09 VITALS — BP 114/75 | HR 69 | Temp 98.1°F | Wt 180.4 lb

## 2021-12-09 DIAGNOSIS — I129 Hypertensive chronic kidney disease with stage 1 through stage 4 chronic kidney disease, or unspecified chronic kidney disease: Secondary | ICD-10-CM

## 2021-12-09 DIAGNOSIS — Z0000 Encounter for general adult medical examination without abnormal findings: Secondary | ICD-10-CM

## 2021-12-09 MED ORDER — AMLODIPINE BESYLATE 10 MG PO TABS: 10.0000 mg | ORAL_TABLET | Freq: Every day | ORAL | 1 refills | Status: DC

## 2021-12-09 NOTE — Progress Notes (Signed)
Assessment & Plan:  Samuel Burns was seen today for hypertension.  Diagnoses and all orders for this visit:  Encounter for annual physical exam  Hypertensive renal disease -     amLODipine (NORVASC) 10 MG tablet; Take 1 tablet (10 mg total) by mouth at bedtime. Continue all antihypertensives as prescribed.  Reminded to bring in blood pressure log for follow  up appointment.  RECOMMENDATIONS: DASH/Mediterranean Diets are healthier choices for HTN.     Patient has been counseled on age-appropriate routine health concerns for screening and prevention. These are reviewed and up-to-date. Referrals have been placed accordingly. Immunizations are up-to-date or declined.    Subjective:   Chief Complaint  Patient presents with   Hypertension   Hypertension Pertinent negatives include no blurred vision, chest pain, headaches, malaise/fatigue, palpitations or shortness of breath.   Samuel Burns 36 y.o. male presents to office today for annual physical exam.   He has an appt on October 31st for right fistulogram. Currently has vas cath for HD on MWF  Blood pressure well controlled. He is requesting a refill of amlodipine today BP Readings from Last 3 Encounters:  12/09/21 114/75  11/21/21 (!) 142/91  10/23/21 118/67     Review of Systems  Constitutional:  Negative for fever, malaise/fatigue and weight loss.  HENT: Negative.  Negative for nosebleeds.   Eyes: Negative.  Negative for blurred vision, double vision and photophobia.  Respiratory: Negative.  Negative for cough and shortness of breath.   Cardiovascular: Negative.  Negative for chest pain, palpitations and leg swelling.  Gastrointestinal: Negative.  Negative for heartburn, nausea and vomiting.  Genitourinary: Negative.   Musculoskeletal: Negative.  Negative for myalgias.  Skin: Negative.   Neurological: Negative.  Negative for dizziness, focal weakness, seizures and headaches.  Endo/Heme/Allergies: Negative.    Psychiatric/Behavioral: Negative.  Negative for suicidal ideas.     Past Medical History:  Diagnosis Date   Asthma    Renal failure     Past Surgical History:  Procedure Laterality Date   AV FISTULA PLACEMENT Right 11/24/2019   Procedure: Creation of RIGHT ARM Brachial/Cephalic ARTERIOVENOUS (AV) FISTULA.;  Surgeon: [name], MD;  Location: [place];  Service: Vascular;  Laterality: Right;   IR AV DIALY SHUNT INTRO NEEDLE/INTRACATH INITIAL W/PTA/IMG RIGHT Right 02/11/2021   IR DIALY SHUNT INTRO NEEDLE/INTRACATH INITIAL W/IMG RIGHT Right 04/30/2020   IR FLUORO GUIDE CV LINE LEFT  11/21/2021   IR FLUORO GUIDE CV LINE RIGHT  11/20/2019   IR REMOVAL TUN CV CATH W/O FL  06/04/2020   IR US GUIDE VASC ACCESS LEFT  11/21/2021   IR US GUIDE VASC ACCESS RIGHT  11/20/2019   IR US GUIDE VASC ACCESS RIGHT  04/30/2020   IR US GUIDE VASC ACCESS RIGHT  02/11/2021    Family History  Problem Relation Age of Onset   Hypertension Maternal Grandmother     Social History Reviewed with no changes to be made today.   Outpatient Medications Prior to Visit  Medication Sig Dispense Refill   acetaminophen (TYLENOL) 500 MG tablet Take 1,000 mg by mouth every 6 (six) hours as needed for moderate pain.     buPROPion (WELLBUTRIN XL) 150 MG 24 hr tablet Take 150 mg by mouth daily.     cloNIDine (CATAPRES) 0.1 MG tablet Take 0.1 mg by mouth 2 (two) times daily.     fluticasone (FLONASE) 50 MCG/ACT nasal spray Place 2 sprays into both nostrils daily. 16 g 0   hydrOXYzine (VISTARIL) 25 MG  capsule Take 25 mg by mouth 3 (three) times daily as needed for anxiety.     lidocaine-prilocaine (EMLA) cream Apply 1 application topically as needed (access fistula).     losartan (COZAAR) 50 MG tablet Take 50 mg by mouth daily.     MELATONIN PO Take 1-2 capsules by mouth at bedtime as needed (sleep).     Metoprolol Tartrate 75 MG TABS TAKE 1 TABLET BY MOUTH TWICE DAILY. 180 tablet 3   Multiple Vitamin (MULTIVITAMIN WITH  MINERALS) TABS tablet Take 1 tablet by mouth daily.     Naphazoline HCl (CLEAR EYES OP) Place 1 drop into both eyes daily as needed (irritation).     sertraline (ZOLOFT) 100 MG tablet Take 100 mg by mouth daily.     sildenafil (VIAGRA) 50 MG tablet 1 tab PO 1/2 hr prior to sex PRN.  Limit use to 1 tab/24 hr 10 tablet 2   sucroferric oxyhydroxide (VELPHORO) 500 MG chewable tablet Chew 500-1,000 mg by mouth See admin instructions. 1000 mg with 3 times daily meals, 500 mg with snacks     amLODipine (NORVASC) 10 MG tablet Take 10 mg by mouth at bedtime.     sevelamer carbonate (RENVELA) 800 MG tablet Take 1 tablet (800 mg total) by mouth 3 (three) times daily with meals. (Patient not taking: Reported on 11/20/2021) 90 tablet 3   No facility-administered medications prior to visit.    No Known Allergies     Objective:    BP 114/75   Pulse 69   Temp 98.1 F (36.7 C) (Temporal)   Wt 180 lb 6.4 oz (81.8 kg)   SpO2 100%   BMI 28.25 kg/m  Wt Readings from Last 3 Encounters:  12/09/21 180 lb 6.4 oz (81.8 kg)  11/21/21 179 lb (81.2 kg)  10/23/21 166 lb (75.3 kg)    Physical Exam Constitutional:      Appearance: He is well-developed.  HENT:     Head: Normocephalic and atraumatic.     Right Ear: Hearing, tympanic membrane, ear canal and external ear normal.     Left Ear: Hearing, tympanic membrane, ear canal and external ear normal.     Nose: Nose normal. No mucosal edema or rhinorrhea.     Right Turbinates: Not enlarged.     Left Turbinates: Not enlarged.     Mouth/Throat:     Lips: Pink.     Mouth: Mucous membranes are moist.     Dentition: No gingival swelling, dental abscesses or gum lesions.     Pharynx: Uvula midline.     Tonsils: No tonsillar exudate. 1+ on the right. 1+ on the left.  Eyes:     General: Lids are normal. No scleral icterus.    Extraocular Movements: Extraocular movements intact.     Conjunctiva/sclera: Conjunctivae normal.     Pupils: Pupils are equal, round,  and reactive to light.  Neck:     Thyroid: No thyromegaly.     Trachea: No tracheal deviation.  Cardiovascular:     Rate and Rhythm: Normal rate and regular rhythm.     Heart sounds: Normal heart sounds. No murmur heard.    No friction rub. No gallop.  Pulmonary:     Effort: Pulmonary effort is normal. No respiratory distress.     Breath sounds: Normal breath sounds. No wheezing or rales.  Chest:     Chest wall: No mass or tenderness.  Breasts:    Right: No inverted nipple, mass, nipple discharge, skin change  or tenderness.     Left: No inverted nipple, mass, nipple discharge, skin change or tenderness.  Abdominal:     General: Bowel sounds are normal. There is no distension.     Palpations: Abdomen is soft. There is no mass.     Tenderness: There is no abdominal tenderness. There is no guarding or rebound.  Musculoskeletal:        General: No tenderness or deformity. Normal range of motion.     Cervical back: Normal range of motion and neck supple.  Lymphadenopathy:     Cervical: No cervical adenopathy.  Skin:    General: Skin is warm and dry.     Capillary Refill: Capillary refill takes less than 2 seconds.     Findings: No erythema.  Neurological:     Mental Status: He is alert and oriented to person, place, and time.     Cranial Nerves: No cranial nerve deficit.     Sensory: Sensation is intact.     Motor: No abnormal muscle tone.     Coordination: Coordination is intact. Coordination normal.     Gait: Gait is intact.     Deep Tendon Reflexes: Reflexes normal.     Reflex Scores:      Patellar reflexes are 1+ on the right side and 1+ on the left side. Psychiatric:        Attention and Perception: Attention normal.        Mood and Affect: Mood normal.        Speech: Speech normal.        Behavior: Behavior normal.        Thought Content: Thought content normal.        Judgment: Judgment normal.          Patient has been counseled extensively about nutrition and  exercise as well as the importance of adherence with medications and regular follow-up. The patient was given clear instructions to go to ER or return to medical center if symptoms don't improve, worsen or new problems develop. The patient verbalized understanding.   Follow-up: Return in about 3 months (around 03/11/2022) for HTN.   Gildardo Pounds, FNP-BC [facility] [place], [place]   12/09/2021, 2:56 PM

## 2021-12-10 DIAGNOSIS — R52 Pain, unspecified: Secondary | ICD-10-CM | POA: Diagnosis not present

## 2021-12-10 DIAGNOSIS — Z23 Encounter for immunization: Secondary | ICD-10-CM | POA: Diagnosis not present

## 2021-12-10 DIAGNOSIS — D689 Coagulation defect, unspecified: Secondary | ICD-10-CM | POA: Diagnosis not present

## 2021-12-10 DIAGNOSIS — Z4800 Encounter for change or removal of nonsurgical wound dressing: Secondary | ICD-10-CM | POA: Diagnosis not present

## 2021-12-10 DIAGNOSIS — D631 Anemia in chronic kidney disease: Secondary | ICD-10-CM | POA: Diagnosis not present

## 2021-12-10 DIAGNOSIS — Z992 Dependence on renal dialysis: Secondary | ICD-10-CM | POA: Diagnosis not present

## 2021-12-10 DIAGNOSIS — N2581 Secondary hyperparathyroidism of renal origin: Secondary | ICD-10-CM | POA: Diagnosis not present

## 2021-12-10 DIAGNOSIS — N186 End stage renal disease: Secondary | ICD-10-CM | POA: Diagnosis not present

## 2021-12-12 DIAGNOSIS — D631 Anemia in chronic kidney disease: Secondary | ICD-10-CM | POA: Diagnosis not present

## 2021-12-12 DIAGNOSIS — R52 Pain, unspecified: Secondary | ICD-10-CM | POA: Diagnosis not present

## 2021-12-12 DIAGNOSIS — Z23 Encounter for immunization: Secondary | ICD-10-CM | POA: Diagnosis not present

## 2021-12-12 DIAGNOSIS — Z992 Dependence on renal dialysis: Secondary | ICD-10-CM | POA: Diagnosis not present

## 2021-12-12 DIAGNOSIS — N2581 Secondary hyperparathyroidism of renal origin: Secondary | ICD-10-CM | POA: Diagnosis not present

## 2021-12-12 DIAGNOSIS — Z4800 Encounter for change or removal of nonsurgical wound dressing: Secondary | ICD-10-CM | POA: Diagnosis not present

## 2021-12-12 DIAGNOSIS — N186 End stage renal disease: Secondary | ICD-10-CM | POA: Diagnosis not present

## 2021-12-12 DIAGNOSIS — D689 Coagulation defect, unspecified: Secondary | ICD-10-CM | POA: Diagnosis not present

## 2021-12-15 DIAGNOSIS — Z23 Encounter for immunization: Secondary | ICD-10-CM | POA: Diagnosis not present

## 2021-12-15 DIAGNOSIS — Z4800 Encounter for change or removal of nonsurgical wound dressing: Secondary | ICD-10-CM | POA: Diagnosis not present

## 2021-12-15 DIAGNOSIS — N2581 Secondary hyperparathyroidism of renal origin: Secondary | ICD-10-CM | POA: Diagnosis not present

## 2021-12-15 DIAGNOSIS — R52 Pain, unspecified: Secondary | ICD-10-CM | POA: Diagnosis not present

## 2021-12-15 DIAGNOSIS — D631 Anemia in chronic kidney disease: Secondary | ICD-10-CM | POA: Diagnosis not present

## 2021-12-15 DIAGNOSIS — Z992 Dependence on renal dialysis: Secondary | ICD-10-CM | POA: Diagnosis not present

## 2021-12-15 DIAGNOSIS — N186 End stage renal disease: Secondary | ICD-10-CM | POA: Diagnosis not present

## 2021-12-15 DIAGNOSIS — D689 Coagulation defect, unspecified: Secondary | ICD-10-CM | POA: Diagnosis not present

## 2021-12-16 ENCOUNTER — Ambulatory Visit (HOSPITAL_COMMUNITY)
Admission: RE | Admit: 2021-12-16 | Discharge: 2021-12-16 | Disposition: A | Discharge status: Home / Self Care | Payer: Medicare Other | Source: Ambulatory Visit | Attending: Vascular Surgery | Admitting: Vascular Surgery

## 2021-12-16 ENCOUNTER — Ambulatory Visit (INDEPENDENT_AMBULATORY_CARE_PROVIDER_SITE_OTHER)
Admission: RE | Admit: 2021-12-16 | Discharge: 2021-12-16 | Disposition: A | Discharge status: Home / Self Care | Payer: Medicare Other | Source: Ambulatory Visit | Attending: Vascular Surgery | Admitting: Vascular Surgery

## 2021-12-16 ENCOUNTER — Ambulatory Visit (INDEPENDENT_AMBULATORY_CARE_PROVIDER_SITE_OTHER): Payer: Medicare Other | Admitting: Vascular Surgery

## 2021-12-16 ENCOUNTER — Encounter: Payer: Self-pay | Admitting: Vascular Surgery

## 2021-12-16 VITALS — BP 130/87 | HR 73 | Temp 97.9°F | Resp 16 | Ht 68.0 in | Wt 174.0 lb

## 2021-12-16 DIAGNOSIS — Z992 Dependence on renal dialysis: Secondary | ICD-10-CM | POA: Diagnosis not present

## 2021-12-16 DIAGNOSIS — N186 End stage renal disease: Secondary | ICD-10-CM | POA: Insufficient documentation

## 2021-12-16 DIAGNOSIS — I129 Hypertensive chronic kidney disease with stage 1 through stage 4 chronic kidney disease, or unspecified chronic kidney disease: Secondary | ICD-10-CM | POA: Diagnosis not present

## 2021-12-16 NOTE — Progress Notes (Signed)
Patient name: Samuel Burns MRN: [id number] DOB: 1985-07-13 Sex: male  REASON FOR CONSULT: Evaluate for new permanent hemodialysis access  HPI: Samuel Burns is a 36 y.o. male, with ESRD on hemodialysis Monday Wednesday Friday that presents for evaluation of new permanent hemodialysis access.  Patient has a previous right brachiocephalic AVF placed in 5277 that failed several weeks ago.  He now has a left IJ tunneled dialysis catheter.  Patient states he is right-handed.  He would prefer to have his new access procedure done on Tuesday or Thursday in his left arm.  Past Medical History:  Diagnosis Date   Asthma    Renal failure     Past Surgical History:  Procedure Laterality Date   AV FISTULA PLACEMENT Right 11/24/2019   Procedure: Creation of RIGHT ARM Brachial/Cephalic ARTERIOVENOUS (AV) FISTULA.;  Surgeon: [name], MD;  Location: [facility] OR;  Service: Vascular;  Laterality: Right;   IR AV DIALY SHUNT INTRO NEEDLE/INTRACATH INITIAL W/PTA/IMG RIGHT Right 02/11/2021   IR DIALY SHUNT INTRO NEEDLE/INTRACATH INITIAL W/IMG RIGHT Right 04/30/2020   IR FLUORO GUIDE CV LINE LEFT  11/21/2021   IR FLUORO GUIDE CV LINE RIGHT  11/20/2019   IR REMOVAL TUN CV CATH W/O FL  06/04/2020   IR US GUIDE VASC ACCESS LEFT  11/21/2021   IR US GUIDE VASC ACCESS RIGHT  11/20/2019   IR US GUIDE VASC ACCESS RIGHT  04/30/2020   IR US GUIDE VASC ACCESS RIGHT  02/11/2021    Family History  Problem Relation Age of Onset   Hypertension Maternal Grandmother     SOCIAL HISTORY: Social History   Socioeconomic History   Marital status: Significant Other    Spouse name: Not on file   Number of children: Not on file   Years of education: Not on file   Highest education level: Not on file  Occupational History   Not on file  Tobacco Use   Smoking status: Never   Smokeless tobacco: Never  Substance and Sexual Activity   Alcohol use: No   Drug use: Yes    Types: Marijuana   Sexual activity: Not on  file  Other Topics Concern   Not on file  Social History Narrative   Not on file   Social Determinants of Health   Financial Resource Strain: Not on file  Food Insecurity: Not on file  Transportation Needs: Not on file  Physical Activity: Not on file  Stress: Not on file  Social Connections: Not on file  Intimate Partner Violence: Not on file    No Known Allergies  Current Outpatient Medications  Medication Sig Dispense Refill   acetaminophen (TYLENOL) 500 MG tablet Take 1,000 mg by mouth every 6 (six) hours as needed for moderate pain.     amLODipine (NORVASC) 10 MG tablet Take 1 tablet (10 mg total) by mouth at bedtime. 90 tablet 1   buPROPion (WELLBUTRIN XL) 150 MG 24 hr tablet Take 150 mg by mouth daily.     cloNIDine (CATAPRES) 0.1 MG tablet Take 0.1 mg by mouth 2 (two) times daily.     fluticasone (FLONASE) 50 MCG/ACT nasal spray Place 2 sprays into both nostrils daily. 16 g 0   hydrOXYzine (VISTARIL) 25 MG capsule Take 25 mg by mouth 3 (three) times daily as needed for anxiety.     lidocaine-prilocaine (EMLA) cream Apply 1 application topically as needed (access fistula).     losartan (COZAAR) 50 MG tablet Take 50 mg by mouth daily.  MELATONIN PO Take 1-2 capsules by mouth at bedtime as needed (sleep).     Metoprolol Tartrate 75 MG TABS TAKE 1 TABLET BY MOUTH TWICE DAILY. 180 tablet 3   Multiple Vitamin (MULTIVITAMIN WITH MINERALS) TABS tablet Take 1 tablet by mouth daily.     Naphazoline HCl (CLEAR EYES OP) Place 1 drop into both eyes daily as needed (irritation).     sertraline (ZOLOFT) 100 MG tablet Take 100 mg by mouth daily.     sildenafil (VIAGRA) 50 MG tablet 1 tab PO 1/2 hr prior to sex PRN.  Limit use to 1 tab/24 hr 10 tablet 2   sucroferric oxyhydroxide (VELPHORO) 500 MG chewable tablet Chew 500-1,000 mg by mouth See admin instructions. 1000 mg with 3 times daily meals, 500 mg with snacks     sevelamer carbonate (RENVELA) 800 MG tablet Take 1 tablet (800 mg  total) by mouth 3 (three) times daily with meals. (Patient not taking: Reported on 11/20/2021) 90 tablet 3   No current facility-administered medications for this visit.    REVIEW OF SYSTEMS:  [X]  denotes positive finding, [ ]  denotes negative finding Cardiac  Comments:  Chest pain or chest pressure:    Shortness of breath upon exertion:    Short of breath when lying flat:    Irregular heart rhythm:        Vascular    Pain in calf, thigh, or hip brought on by ambulation:    Pain in feet at night that wakes you up from your sleep:     Blood clot in your veins:    Leg swelling:         Pulmonary    Oxygen at home:    Productive cough:     Wheezing:         Neurologic    Sudden weakness in arms or legs:     Sudden numbness in arms or legs:     Sudden onset of difficulty speaking or slurred speech:    Temporary loss of vision in one eye:     Problems with dizziness:         Gastrointestinal    Blood in stool:     Vomited blood:         Genitourinary    Burning when urinating:     Blood in urine:        Psychiatric    Major depression:         Hematologic    Bleeding problems:    Problems with blood clotting too easily:        Skin    Rashes or ulcers:        Constitutional    Fever or chills:      PHYSICAL EXAM: Vitals:   12/16/21 1557  BP: 130/87  Pulse: 73  Resp: 16  Temp: 97.9 F (36.6 C)  TempSrc: Temporal  SpO2: 98%  Weight: 174 lb (78.9 kg)  Height: 5\' 8"  (1.727 m)    GENERAL: The patient is a well-nourished male, in no acute distress. The vital signs are documented above. CARDIAC: There is a regular rate and rhythm.  VASCULAR:  Palpable radial and brachial pulses bilateral upper extremities LIJ TDC PULMONARY: No respiratory distress. ABDOMEN: Soft and non-tender.  MUSCULOSKELETAL: There are no major deformities or cyanosis. NEUROLOGIC: No focal weakness or paresthesias are detected. SKIN: There are no ulcers or rashes noted. PSYCHIATRIC:  The patient has a normal affect.  DATA:   Vein mapping shows  usable basilic vein in the right arm as well as a usable cephalic and basilic vein in the left arm  Assessment/Plan:  36 year old male with end-stage renal disease on hemodialysis Monday Wednesday Friday that presents for evaluation of new permanent hemodialysis access in the setting of failed right brachiocephalic AV fistula.  Patient is right-handed and states he would actually prefer to have access placed in his left arm.  Vein mapping today shows a good cephalic and basilic vein in the left arm.  He has palpable radial and brachial pulses in the left arm.  I discussed hemodialysis access at [facility].  He would prefer this being done on a Tuesday or Thursday and I discussed this may be with one my partners.  Risk benefits discussed.  He wishes to proceed.   [name], MD [facility] [place] Office: [phone]

## 2021-12-17 ENCOUNTER — Other Ambulatory Visit: Payer: Self-pay

## 2021-12-17 DIAGNOSIS — N186 End stage renal disease: Secondary | ICD-10-CM

## 2021-12-17 DIAGNOSIS — Z992 Dependence on renal dialysis: Secondary | ICD-10-CM | POA: Diagnosis not present

## 2021-12-17 DIAGNOSIS — D631 Anemia in chronic kidney disease: Secondary | ICD-10-CM | POA: Diagnosis not present

## 2021-12-17 DIAGNOSIS — D689 Coagulation defect, unspecified: Secondary | ICD-10-CM | POA: Diagnosis not present

## 2021-12-17 DIAGNOSIS — Z4800 Encounter for change or removal of nonsurgical wound dressing: Secondary | ICD-10-CM | POA: Diagnosis not present

## 2021-12-17 DIAGNOSIS — N2581 Secondary hyperparathyroidism of renal origin: Secondary | ICD-10-CM | POA: Diagnosis not present

## 2021-12-19 ENCOUNTER — Encounter (HOSPITAL_COMMUNITY): Payer: Self-pay | Admitting: Vascular Surgery

## 2021-12-19 ENCOUNTER — Other Ambulatory Visit: Payer: Self-pay

## 2021-12-19 DIAGNOSIS — Z992 Dependence on renal dialysis: Secondary | ICD-10-CM | POA: Diagnosis not present

## 2021-12-19 DIAGNOSIS — D689 Coagulation defect, unspecified: Secondary | ICD-10-CM | POA: Diagnosis not present

## 2021-12-19 DIAGNOSIS — T8249XA Other complication of vascular dialysis catheter, initial encounter: Secondary | ICD-10-CM | POA: Diagnosis not present

## 2021-12-19 DIAGNOSIS — N186 End stage renal disease: Secondary | ICD-10-CM | POA: Diagnosis not present

## 2021-12-19 DIAGNOSIS — N2581 Secondary hyperparathyroidism of renal origin: Secondary | ICD-10-CM | POA: Diagnosis not present

## 2021-12-19 DIAGNOSIS — Z4800 Encounter for change or removal of nonsurgical wound dressing: Secondary | ICD-10-CM | POA: Diagnosis not present

## 2021-12-19 DIAGNOSIS — D631 Anemia in chronic kidney disease: Secondary | ICD-10-CM | POA: Diagnosis not present

## 2021-12-19 NOTE — Progress Notes (Signed)
Spoke with pt for pre-op call. Pt denies cardiac history or Diabetes. He is treated for HTN and is on dialysis on M/W/F. I asked him if he had made arrangements with the Dialysis center since he was having surgery on Monday. He states he will have dialysis on Tuesday.   Shower instructions given to pt and he voiced understanding.

## 2021-12-20 DIAGNOSIS — N2581 Secondary hyperparathyroidism of renal origin: Secondary | ICD-10-CM | POA: Diagnosis not present

## 2021-12-20 DIAGNOSIS — Z992 Dependence on renal dialysis: Secondary | ICD-10-CM | POA: Diagnosis not present

## 2021-12-20 DIAGNOSIS — Z4800 Encounter for change or removal of nonsurgical wound dressing: Secondary | ICD-10-CM | POA: Diagnosis not present

## 2021-12-20 DIAGNOSIS — D689 Coagulation defect, unspecified: Secondary | ICD-10-CM | POA: Diagnosis not present

## 2021-12-20 DIAGNOSIS — N186 End stage renal disease: Secondary | ICD-10-CM | POA: Diagnosis not present

## 2021-12-20 DIAGNOSIS — D631 Anemia in chronic kidney disease: Secondary | ICD-10-CM | POA: Diagnosis not present

## 2021-12-22 ENCOUNTER — Encounter (HOSPITAL_COMMUNITY)
Admission: RE | Disposition: A | Discharge status: Home / Self Care | Payer: Self-pay | Source: Home / Self Care | Attending: Vascular Surgery

## 2021-12-22 ENCOUNTER — Ambulatory Visit (HOSPITAL_COMMUNITY)
Admission: RE | Admit: 2021-12-22 | Discharge: 2021-12-22 | Disposition: A | Discharge status: Home / Self Care | Payer: Medicare Other | Attending: Vascular Surgery | Admitting: Vascular Surgery

## 2021-12-22 ENCOUNTER — Other Ambulatory Visit: Payer: Self-pay

## 2021-12-22 ENCOUNTER — Ambulatory Visit (HOSPITAL_BASED_OUTPATIENT_CLINIC_OR_DEPARTMENT_OTHER): Payer: Medicare Other | Admitting: Anesthesiology

## 2021-12-22 ENCOUNTER — Ambulatory Visit (HOSPITAL_COMMUNITY): Payer: Medicare Other | Admitting: Anesthesiology

## 2021-12-22 ENCOUNTER — Other Ambulatory Visit (HOSPITAL_COMMUNITY): Payer: Self-pay

## 2021-12-22 ENCOUNTER — Encounter (HOSPITAL_COMMUNITY): Payer: Self-pay | Admitting: Vascular Surgery

## 2021-12-22 DIAGNOSIS — D631 Anemia in chronic kidney disease: Secondary | ICD-10-CM

## 2021-12-22 DIAGNOSIS — F32A Depression, unspecified: Secondary | ICD-10-CM | POA: Diagnosis not present

## 2021-12-22 DIAGNOSIS — N185 Chronic kidney disease, stage 5: Secondary | ICD-10-CM | POA: Diagnosis not present

## 2021-12-22 DIAGNOSIS — I120 Hypertensive chronic kidney disease with stage 5 chronic kidney disease or end stage renal disease: Secondary | ICD-10-CM | POA: Insufficient documentation

## 2021-12-22 DIAGNOSIS — Z992 Dependence on renal dialysis: Secondary | ICD-10-CM | POA: Diagnosis not present

## 2021-12-22 DIAGNOSIS — F909 Attention-deficit hyperactivity disorder, unspecified type: Secondary | ICD-10-CM | POA: Diagnosis not present

## 2021-12-22 DIAGNOSIS — N186 End stage renal disease: Secondary | ICD-10-CM | POA: Insufficient documentation

## 2021-12-22 DIAGNOSIS — Z79899 Other long term (current) drug therapy: Secondary | ICD-10-CM | POA: Insufficient documentation

## 2021-12-22 DIAGNOSIS — F418 Other specified anxiety disorders: Secondary | ICD-10-CM

## 2021-12-22 DIAGNOSIS — F419 Anxiety disorder, unspecified: Secondary | ICD-10-CM | POA: Diagnosis not present

## 2021-12-22 HISTORY — DX: Depression, unspecified: F32.A

## 2021-12-22 HISTORY — DX: Anxiety disorder, unspecified: F41.9

## 2021-12-22 HISTORY — DX: Anemia, unspecified: D64.9

## 2021-12-22 HISTORY — PX: AV FISTULA PLACEMENT: SHX1204

## 2021-12-22 HISTORY — DX: Attention-deficit hyperactivity disorder, unspecified type: F90.9

## 2021-12-22 HISTORY — DX: Essential (primary) hypertension: I10

## 2021-12-22 LAB — POCT I-STAT, CHEM 8
BUN: 62 mg/dL — ABNORMAL HIGH (ref 6–20)
Calcium, Ion: 1.16 mmol/L (ref 1.15–1.40)
Chloride: 103 mmol/L (ref 98–111)
Creatinine, Ser: 13.2 mg/dL — ABNORMAL HIGH (ref 0.61–1.24)
Glucose, Bld: 75 mg/dL (ref 70–99)
HCT: 40 % (ref 39.0–52.0)
Hemoglobin: 13.6 g/dL (ref 13.0–17.0)
Potassium: 5 mmol/L (ref 3.5–5.1)
Sodium: 141 mmol/L (ref 135–145)
TCO2: 28 mmol/L (ref 22–32)

## 2021-12-22 SURGERY — ARTERIOVENOUS (AV) FISTULA CREATION
Anesthesia: Monitor Anesthesia Care | Laterality: Left

## 2021-12-22 MED ORDER — OXYCODONE-ACETAMINOPHEN 5-325 MG PO TABS
1.0000 | ORAL_TABLET | Freq: Four times a day (QID) | ORAL | 0 refills | Status: DC | PRN
  Filled 2021-12-22: qty 12, 3d supply, fill #0

## 2021-12-22 MED ORDER — CEFAZOLIN SODIUM-DEXTROSE 2-4 GM/100ML-% IV SOLN
2.0000 g | INTRAVENOUS | Status: AC
  Administered 2021-12-22: 2 g via INTRAVENOUS
  Filled 2021-12-22: qty 100

## 2021-12-22 MED ORDER — HEPARIN SODIUM (PORCINE) 1000 UNIT/ML IJ SOLN
INTRAMUSCULAR | Status: DC | PRN
  Administered 2021-12-22: 3000 [IU] via INTRAVENOUS

## 2021-12-22 MED ORDER — PROPOFOL 500 MG/50ML IV EMUL
INTRAVENOUS | Status: DC | PRN
  Administered 2021-12-22: 75 ug/kg/min via INTRAVENOUS

## 2021-12-22 MED ORDER — FENTANYL CITRATE (PF) 100 MCG/2ML IJ SOLN
INTRAMUSCULAR | Status: AC
  Administered 2021-12-22: 50 ug via INTRAVENOUS
  Filled 2021-12-22: qty 2

## 2021-12-22 MED ORDER — SODIUM CHLORIDE 0.9 % IV SOLN: INTRAVENOUS | Status: DC

## 2021-12-22 MED ORDER — ORAL CARE MOUTH RINSE: 15.0000 mL | Freq: Once | OROMUCOSAL | Status: AC

## 2021-12-22 MED ORDER — ONDANSETRON HCL 4 MG/2ML IJ SOLN
INTRAMUSCULAR | Status: DC | PRN
  Administered 2021-12-22: 4 mg via INTRAVENOUS

## 2021-12-22 MED ORDER — CHLORHEXIDINE GLUCONATE 4 % EX LIQD: 60.0000 mL | Freq: Once | CUTANEOUS | Status: DC

## 2021-12-22 MED ORDER — ACETAMINOPHEN 10 MG/ML IV SOLN: 1000.0000 mg | Freq: Once | INTRAVENOUS | Status: DC | PRN

## 2021-12-22 MED ORDER — LIDOCAINE-EPINEPHRINE (PF) 1.5 %-1:200000 IJ SOLN
INTRAMUSCULAR | Status: DC | PRN
  Administered 2021-12-22: 30 mL via PERINEURAL

## 2021-12-22 MED ORDER — HEPARIN 6000 UNIT IRRIGATION SOLUTION
Status: DC | PRN
  Administered 2021-12-22: 1

## 2021-12-22 MED ORDER — HEPARIN 6000 UNIT IRRIGATION SOLUTION
Status: AC
  Filled 2021-12-22: qty 500

## 2021-12-22 MED ORDER — MIDAZOLAM HCL 2 MG/2ML IJ SOLN: 2.0000 mg | Freq: Once | INTRAMUSCULAR | Status: AC

## 2021-12-22 MED ORDER — FENTANYL CITRATE (PF) 100 MCG/2ML IJ SOLN: 50.0000 ug | Freq: Once | INTRAMUSCULAR | Status: AC

## 2021-12-22 MED ORDER — FENTANYL CITRATE (PF) 100 MCG/2ML IJ SOLN: 25.0000 ug | INTRAMUSCULAR | Status: DC | PRN

## 2021-12-22 MED ORDER — LIDOCAINE 2% (20 MG/ML) 5 ML SYRINGE
INTRAMUSCULAR | Status: DC | PRN
  Administered 2021-12-22: 60 mg via INTRAVENOUS

## 2021-12-22 MED ORDER — MIDAZOLAM HCL 2 MG/2ML IJ SOLN
INTRAMUSCULAR | Status: AC
  Administered 2021-12-22: 2 mg via INTRAVENOUS
  Filled 2021-12-22: qty 2

## 2021-12-22 MED ORDER — METOPROLOL TARTRATE 50 MG PO TABS
75.0000 mg | ORAL_TABLET | Freq: Once | ORAL | Status: AC
  Administered 2021-12-22: 75 mg via ORAL
  Filled 2021-12-22: qty 2

## 2021-12-22 MED ORDER — CHLORHEXIDINE GLUCONATE 0.12 % MT SOLN
15.0000 mL | Freq: Once | OROMUCOSAL | Status: AC
  Administered 2021-12-22: 15 mL via OROMUCOSAL
  Filled 2021-12-22: qty 15

## 2021-12-22 MED ORDER — 0.9 % SODIUM CHLORIDE (POUR BTL) OPTIME
TOPICAL | Status: DC | PRN
  Administered 2021-12-22: 1000 mL

## 2021-12-22 SURGICAL SUPPLY — 36 items
ARMBAND PINK RESTRICT EXTREMIT (MISCELLANEOUS) ×2 IMPLANT
BAG COUNTER SPONGE SURGICOUNT (BAG) ×1 IMPLANT
BLADE CLIPPER SURG (BLADE) ×1 IMPLANT
CANISTER SUCT 3000ML PPV (MISCELLANEOUS) ×1 IMPLANT
CLIP TI WIDE RED SMALL 6 (CLIP) IMPLANT
CLIP VESOCCLUDE MED 6/CT (CLIP) ×1 IMPLANT
CLIP VESOCCLUDE SM WIDE 6/CT (CLIP) ×1 IMPLANT
COVER PROBE W GEL 5X96 (DRAPES) ×1 IMPLANT
DERMABOND ADVANCED .7 DNX12 (GAUZE/BANDAGES/DRESSINGS) ×1 IMPLANT
DERMABOND ADVANCED .7 DNX6 (GAUZE/BANDAGES/DRESSINGS) IMPLANT
ELECT REM PT RETURN 9FT ADLT (ELECTROSURGICAL) ×1
ELECTRODE REM PT RTRN 9FT ADLT (ELECTROSURGICAL) ×1 IMPLANT
GLOVE BIO SURGEON STRL SZ7.5 (GLOVE) ×1 IMPLANT
GLOVE BIOGEL PI IND STRL 8 (GLOVE) ×1 IMPLANT
GOWN STRL REUS W/ TWL LRG LVL3 (GOWN DISPOSABLE) ×2 IMPLANT
GOWN STRL REUS W/ TWL XL LVL3 (GOWN DISPOSABLE) ×2 IMPLANT
GOWN STRL REUS W/TWL LRG LVL3 (GOWN DISPOSABLE) ×2
GOWN STRL REUS W/TWL XL LVL3 (GOWN DISPOSABLE) ×2
HEMOSTAT SPONGE AVITENE ULTRA (HEMOSTASIS) IMPLANT
KIT BASIN OR (CUSTOM PROCEDURE TRAY) ×1 IMPLANT
KIT TURNOVER KIT B (KITS) ×1 IMPLANT
LOOP VESSEL MINI RED (MISCELLANEOUS) IMPLANT
NS IRRIG 1000ML POUR BTL (IV SOLUTION) ×1 IMPLANT
PACK CV ACCESS (CUSTOM PROCEDURE TRAY) ×1 IMPLANT
PAD ARMBOARD 7.5X6 YLW CONV (MISCELLANEOUS) ×2 IMPLANT
SLING ARM FOAM STRAP LRG (SOFTGOODS) IMPLANT
SLING ARM FOAM STRAP MED (SOFTGOODS) IMPLANT
SPIKE FLUID TRANSFER (MISCELLANEOUS) ×1 IMPLANT
SUT MNCRL AB 4-0 PS2 18 (SUTURE) ×1 IMPLANT
SUT PROLENE 6 0 BV (SUTURE) ×1 IMPLANT
SUT PROLENE 7 0 BV 1 (SUTURE) IMPLANT
SUT VIC AB 3-0 SH 27 (SUTURE) ×1
SUT VIC AB 3-0 SH 27X BRD (SUTURE) ×1 IMPLANT
TOWEL GREEN STERILE (TOWEL DISPOSABLE) ×1 IMPLANT
UNDERPAD 30X36 HEAVY ABSORB (UNDERPADS AND DIAPERS) ×1 IMPLANT
WATER STERILE IRR 1000ML POUR (IV SOLUTION) ×1 IMPLANT

## 2021-12-22 NOTE — Discharge Instructions (Signed)
° °  [facility] ° °Discharge Instructions ° °AV Fistula or Graft Surgery for Dialysis Access ° °Please refer to the following instructions for your post-procedure care. Your surgeon or physician assistant will discuss any changes with you. ° °Activity ° °You may drive the day following your surgery, if you are comfortable and no longer taking prescription pain medication. Resume full activity as the soreness in your incision resolves. ° °Bathing/Showering ° °You may shower after you go home. Keep your incision dry for 48 hours. Do not soak in a bathtub, hot tub, or swim until the incision heals completely. You may not shower if you have a hemodialysis catheter. ° °Incision Care ° °Clean your incision with mild soap and water after 48 hours. Pat the area dry with a clean towel. You do not need a bandage unless otherwise instructed. Do not apply any ointments or creams to your incision. You may have skin glue on your incision. Do not peel it off. It will come off on its own in about one week. Your arm may swell a bit after surgery. To reduce swelling use pillows to elevate your arm so it is above your heart. Your doctor will tell you if you need to lightly wrap your arm with an ACE bandage. ° °Diet ° °Resume your normal diet. There are not special food restrictions following this procedure. In order to heal from your surgery, it is CRITICAL to get adequate nutrition. Your body requires vitamins, minerals, and protein. Vegetables are the best source of vitamins and minerals. Vegetables also provide the perfect balance of protein. Processed food has little nutritional value, so try to avoid this. ° °Medications ° °Resume taking all of your medications. If your incision is causing pain, you may take over-the counter pain relievers such as acetaminophen (Tylenol). If you were prescribed a stronger pain medication, please be aware these medications can cause nausea and constipation. Prevent  nausea by taking the medication with a snack or meal. Avoid constipation by drinking plenty of fluids and eating foods with high amount of fiber, such as fruits, vegetables, and grains. Do not take Tylenol if you are taking prescription pain medications. ° ° ° ° °Follow up °Your surgeon may want to see you in the office following your access surgery. If so, this will be arranged at the time of your surgery. ° °Please call us immediately for any of the following conditions: ° °Increased pain, redness, drainage (pus) from your incision site °Fever of 101 degrees or higher °Severe or worsening pain at your incision site °Hand pain or numbness. ° °Reduce your risk of vascular disease: ° °Stop smoking. If you would like help, call [facility] at 1-800-QUIT-NOW (1-800-784-8669) or [facility] at 336-586-4000 ° °Manage your cholesterol °Maintain a desired weight °Control your diabetes °Keep your blood pressure down ° °Dialysis ° °It will take several weeks to several months for your new dialysis access to be ready for use. Your surgeon will determine when it is OK to use it. Your nephrologist will continue to direct your dialysis. You can continue to use your Permcath until your new access is ready for use. ° °If you have any questions, please call the office at [phone]. ° °

## 2021-12-22 NOTE — Anesthesia Postprocedure Evaluation (Signed)
Anesthesia Post Note  Patient: Samuel Burns  Procedure(s) Performed: LEFT ARM BRACHIOCEPHALIC ARTERIOVENOUS (AV) FISTULA CREATION (Left)     Patient location during evaluation: PACU Anesthesia Type: MAC and Regional Level of consciousness: awake and alert Pain management: pain level controlled Vital Signs Assessment: post-procedure vital signs reviewed and stable Respiratory status: spontaneous breathing, nonlabored ventilation, respiratory function stable and patient connected to nasal cannula oxygen Cardiovascular status: stable and blood pressure returned to baseline Postop Assessment: no apparent nausea or vomiting Anesthetic complications: no   No notable events documented.  Last Vitals:  Vitals:   12/22/21 1245 12/22/21 1300  BP: 125/83 (!) 130/90  Pulse: 69 67  Resp: 13 18  Temp:  36.6 C  SpO2: 95% 97%    Last Pain:  Vitals:   12/22/21 1300  TempSrc:   PainSc: 0-No pain                 Belenda Cruise P Kathee Tumlin

## 2021-12-22 NOTE — Anesthesia Procedure Notes (Signed)
Procedure Name: MAC Date/Time: 12/22/2021 11:19 AM  Performed by: Babs Bertin, CRNAPre-anesthesia Checklist: Patient identified, Emergency Drugs available, Suction available, Patient being monitored and Timeout performed Patient Re-evaluated:Patient Re-evaluated prior to induction Oxygen Delivery Method: Simple face mask

## 2021-12-22 NOTE — Transfer of Care (Signed)
Immediate Anesthesia Transfer of Care Note  Patient: Samuel Burns  Procedure(s) Performed: LEFT ARM BRACHIOCEPHALIC ARTERIOVENOUS (AV) FISTULA CREATION (Left)  Patient Location: PACU  Anesthesia Type:MAC  Level of Consciousness: drowsy  Airway & Oxygen Therapy: Patient Spontanous Breathing  Post-op Assessment: Report given to RN and Post -op Vital signs reviewed and stable  Post vital signs: Reviewed and stable  Last Vitals:  Vitals Value Taken Time  BP 119/76 12/22/21 1227  Temp    Pulse 71 12/22/21 1230  Resp 14 12/22/21 1230  SpO2 96 % 12/22/21 1230  Vitals shown include unvalidated device data.  Last Pain:  Vitals:   12/22/21 0950  TempSrc:   PainSc: 0-No pain         Complications: No notable events documented.

## 2021-12-22 NOTE — H&P (Signed)
History and Physical Interval Note:  12/22/2021 10:39 AM  Samuel Burns  has presented today for surgery, with the diagnosis of END STAGE RENAL DISEASE.  The various methods of treatment have been discussed with the patient and family. After consideration of risks, benefits and other options for treatment, the patient has consented to  Procedure(s): LEFT ARM ARTERIOVENOUS (AV) FISTULA CREATION (Left) as a surgical intervention.  The patient's history has been reviewed, patient examined, no change in status, stable for surgery.  I have reviewed the patient's chart and labs.  Questions were answered to the patient's satisfaction.    Left arm AVF  [name]        Patient name: Samuel Burns   MRN: [id number]        DOB: 05/11/1985        Sex: male   REASON FOR CONSULT: Evaluate for new permanent hemodialysis access   HPI: Samuel Burns is a 36 y.o. male, with ESRD on hemodialysis Monday Wednesday Friday that presents for evaluation of new permanent hemodialysis access.  Patient has a previous right brachiocephalic AVF placed in 0258 that failed several weeks ago.  He now has a left IJ tunneled dialysis catheter.  Patient states he is right-handed.  He would prefer to have his new access procedure done on Tuesday or Thursday in his left arm.       Past Medical History:  Diagnosis Date   Asthma     Renal failure             Past Surgical History:  Procedure Laterality Date   AV FISTULA PLACEMENT Right 11/24/2019    Procedure: Creation of RIGHT ARM Brachial/Cephalic ARTERIOVENOUS (AV) FISTULA.;  Surgeon: [name], MD;  Location: [facility] OR;  Service: Vascular;  Laterality: Right;   IR AV DIALY SHUNT INTRO NEEDLE/INTRACATH INITIAL W/PTA/IMG RIGHT Right 02/11/2021   IR DIALY SHUNT INTRO NEEDLE/INTRACATH INITIAL W/IMG RIGHT Right 04/30/2020   IR FLUORO GUIDE CV LINE LEFT   11/21/2021   IR FLUORO GUIDE CV LINE RIGHT   11/20/2019   IR REMOVAL TUN CV CATH W/O FL   06/04/2020    IR US GUIDE VASC ACCESS LEFT   11/21/2021   IR US GUIDE VASC ACCESS RIGHT   11/20/2019   IR US GUIDE VASC ACCESS RIGHT   04/30/2020   IR US GUIDE VASC ACCESS RIGHT   02/11/2021           Family History  Problem Relation Age of Onset   Hypertension Maternal Grandmother        SOCIAL HISTORY: Social History         Socioeconomic History   Marital status: Significant Other      Spouse name: Not on file   Number of children: Not on file   Years of education: Not on file   Highest education level: Not on file  Occupational History   Not on file  Tobacco Use   Smoking status: Never   Smokeless tobacco: Never  Substance and Sexual Activity   Alcohol use: No   Drug use: Yes      Types: Marijuana   Sexual activity: Not on file  Other Topics Concern   Not on file  Social History Narrative   Not on file    Social Determinants of Health    Financial Resource Strain: Not on file  Food Insecurity: Not on file  Transportation Needs: Not on file  Physical Activity: Not on file  Stress: Not  on file  Social Connections: Not on file  Intimate Partner Violence: Not on file      No Known Allergies         Current Outpatient Medications  Medication Sig Dispense Refill   acetaminophen (TYLENOL) 500 MG tablet Take 1,000 mg by mouth every 6 (six) hours as needed for moderate pain.       amLODipine (NORVASC) 10 MG tablet Take 1 tablet (10 mg total) by mouth at bedtime. 90 tablet 1   buPROPion (WELLBUTRIN XL) 150 MG 24 hr tablet Take 150 mg by mouth daily.       cloNIDine (CATAPRES) 0.1 MG tablet Take 0.1 mg by mouth 2 (two) times daily.       fluticasone (FLONASE) 50 MCG/ACT nasal spray Place 2 sprays into both nostrils daily. 16 g 0   hydrOXYzine (VISTARIL) 25 MG capsule Take 25 mg by mouth 3 (three) times daily as needed for anxiety.       lidocaine-prilocaine (EMLA) cream Apply 1 application topically as needed (access fistula).       losartan (COZAAR) 50 MG tablet Take 50 mg by  mouth daily.       MELATONIN PO Take 1-2 capsules by mouth at bedtime as needed (sleep).       Metoprolol Tartrate 75 MG TABS TAKE 1 TABLET BY MOUTH TWICE DAILY. 180 tablet 3   Multiple Vitamin (MULTIVITAMIN WITH MINERALS) TABS tablet Take 1 tablet by mouth daily.       Naphazoline HCl (CLEAR EYES OP) Place 1 drop into both eyes daily as needed (irritation).       sertraline (ZOLOFT) 100 MG tablet Take 100 mg by mouth daily.       sildenafil (VIAGRA) 50 MG tablet 1 tab PO 1/2 hr prior to sex PRN.  Limit use to 1 tab/24 hr 10 tablet 2   sucroferric oxyhydroxide (VELPHORO) 500 MG chewable tablet Chew 500-1,000 mg by mouth See admin instructions. 1000 mg with 3 times daily meals, 500 mg with snacks       sevelamer carbonate (RENVELA) 800 MG tablet Take 1 tablet (800 mg total) by mouth 3 (three) times daily with meals. (Patient not taking: Reported on 11/20/2021) 90 tablet 3    No current facility-administered medications for this visit.      REVIEW OF SYSTEMS:  [X]  denotes positive finding, [ ]  denotes negative finding Cardiac   Comments:  Chest pain or chest pressure:      Shortness of breath upon exertion:      Short of breath when lying flat:      Irregular heart rhythm:             Vascular      Pain in calf, thigh, or hip brought on by ambulation:      Pain in feet at night that wakes you up from your sleep:       Blood clot in your veins:      Leg swelling:              Pulmonary      Oxygen at home:      Productive cough:       Wheezing:              Neurologic      Sudden weakness in arms or legs:       Sudden numbness in arms or legs:       Sudden onset of difficulty speaking or slurred speech:  Temporary loss of vision in one eye:       Problems with dizziness:              Gastrointestinal      Blood in stool:       Vomited blood:              Genitourinary      Burning when urinating:       Blood in urine:             Psychiatric      Major depression:               Hematologic      Bleeding problems:      Problems with blood clotting too easily:             Skin      Rashes or ulcers:             Constitutional      Fever or chills:          PHYSICAL EXAM:    Vitals:    12/16/21 1557  BP: 130/87  Pulse: 73  Resp: 16  Temp: 97.9 F (36.6 C)  TempSrc: Temporal  SpO2: 98%  Weight: 174 lb (78.9 kg)  Height: 5\' 8"  (1.727 m)      GENERAL: The patient is a well-nourished male, in no acute distress. The vital signs are documented above. CARDIAC: There is a regular rate and rhythm.  VASCULAR:  Palpable radial and brachial pulses bilateral upper extremities LIJ TDC PULMONARY: No respiratory distress. ABDOMEN: Soft and non-tender.  MUSCULOSKELETAL: There are no major deformities or cyanosis. NEUROLOGIC: No focal weakness or paresthesias are detected. SKIN: There are no ulcers or rashes noted. PSYCHIATRIC: The patient has a normal affect.   DATA:    Vein mapping shows usable basilic vein in the right arm as well as a usable cephalic and basilic vein in the left arm   Assessment/Plan:   36 year old male with end-stage renal disease on hemodialysis Monday Wednesday Friday that presents for evaluation of new permanent hemodialysis access in the setting of failed right brachiocephalic AV fistula.  Patient is right-handed and states he would actually prefer to have access placed in his left arm.  Vein mapping today shows a good cephalic and basilic vein in the left arm.  He has palpable radial and brachial pulses in the left arm.  I discussed hemodialysis access at [facility].  He would prefer this being done on a Tuesday or Thursday and I discussed this may be with one my partners.  Risk benefits discussed.  He wishes to proceed.     [name], MD [facility] [place] Office: [phone]

## 2021-12-22 NOTE — Op Note (Signed)
OPERATIVE NOTE   PROCEDURE: left brachiocephalic arteriovenous fistula placement  PRE-OPERATIVE DIAGNOSIS: end stage renal disease  POST-OPERATIVE DIAGNOSIS: same as above   SURGEON: [name], MD  ASSISTANT(S): Roxy Horseman, PA  ANESTHESIA: regional  ESTIMATED BLOOD LOSS: <25 mL  FINDING(S): 1.  Cephalic vein: 3 mm, acceptable 2.  Brachial artery: 4.5 mm, disease free 3.  Venous outflow: palpable thrill  4.  Radial flow: palpable radial pulse  SPECIMEN(S):  none  INDICATIONS:   Samuel Burns is a 36 y.o. male who presents with end stage renal disease and the need for permanent hemodialysis access.  The patient is scheduled for left arm arteriovenous fistula placement.  The patient is aware the risks include but are not limited to: bleeding, infection, steal syndrome, nerve damage, ischemic monomelic neuropathy, failure to mature, and need for additional procedures.  The patient is aware of the risks of the procedure and elects to proceed forward.  An assistant was needed for exposure and for the anastomosis.   DESCRIPTION: After full informed written consent was obtained from the patient, the patient was brought back to the operating room and placed supine upon the operating table.  Prior to induction, the patient received IV antibiotics.   After obtaining adequate anesthesia, the patient was then prepped and draped in the standard fashion for a left arm access procedure.  I turned my attention first to identifying the patient's cephalic vein.  This was too small at the wrist and of better caliber of the elbow.  I also marked the brachial artery here.  I made a transverse incision at the level of the antecubitum and dissected through the subcutaneous tissue and fascia to gain exposure of the brachial artery.  This was noted to be 4.5 mm in diameter externally.  This was dissected out proximally and distally and controlled with vessel loops .  I then dissected  out the cephalic vein.  This was noted to be 3 mm in diameter externally.  The distal segment of the vein was ligated with a  2-0 silk, and the vein was transected.  The proximal segment was interrogated with serial dilators.  The vein accepted up to a 4 mm dilator without any difficulty.  I then instilled the heparinized saline into the vein and clamped it.  At this point, I reset my exposure of the brachial artery.  The patient was given 3,000 units IV heparin.  I then placed the artery under tension proximally and distally.  I made an arteriotomy with a #11 blade, and then I extended the arteriotomy with a Potts scissor.  I injected heparinized saline proximal and distal to this arteriotomy.  The vein was then sewn to the artery in an end-to-side configuration with a running stitch of 6-0 Prolene with the help of my assistant.  Prior to completing this anastomosis, I allowed the vein and artery to backbleed.  There was no evidence of clot from any vessels.  I completed the anastomosis in the usual fashion and then released all vessel loops and clamps.    There was a palpable thrill in the venous outflow, and there was a palpable radial pulse.  At this point, I irrigated out the surgical wound.  There was no further active bleeding.  The subcutaneous tissue was reapproximated with a running stitch of 3-0 Vicryl.  The skin was then reapproximated with a running subcuticular stitch of 4-0 Monocryl.  The skin was then cleaned, dried, and reinforced with Dermabond.  The patient tolerated this procedure well.   COMPLICATIONS: None  CONDITION: Stable  [name], MD [facility] Office: [place]   12/22/2021, 12:24 PM

## 2021-12-22 NOTE — Anesthesia Procedure Notes (Signed)
Anesthesia Regional Block: Supraclavicular block   Pre-Anesthetic Checklist: , timeout performed,  Correct Patient, Correct Site, Correct Laterality,  Correct Procedure, Correct Position, site marked,  Risks and benefits discussed,  Surgical consent,  Pre-op evaluation,  At surgeon's request and post-op pain management  Laterality: Left  Prep: Dura Prep       Needles:  Injection technique: Single-shot  Needle Type: Echogenic Stimulator Needle     Needle Length: 5cm  Needle Gauge: 20     Additional Needles:   Procedures:,,,, ultrasound used (permanent image in chart),,    Narrative:  Start time: 12/22/2021 10:40 AM End time: 12/22/2021 10:43 AM Injection made incrementally with aspirations every 5 mL.  Performed by: Personally  Anesthesiologist: Darral Dash, DO  Additional Notes: Patient identified. Risks/Benefits/Options discussed with patient including but not limited to bleeding, infection, nerve damage, failed block, incomplete pain control. Patient expressed understanding and wished to proceed. All questions were answered. Sterile technique was used throughout the entire procedure. Please see nursing notes for vital signs. Aspirated in 5cc intervals with injection for negative confirmation. Patient was given instructions on fall risk and not to get out of bed. All questions and concerns addressed with instructions to call with any issues or inadequate analgesia.

## 2021-12-22 NOTE — Anesthesia Preprocedure Evaluation (Signed)
Anesthesia Evaluation  Patient identified by MRN, date of birth, ID band Patient awake    Reviewed: Allergy & Precautions, NPO status , Patient's Chart, lab work & pertinent test results  Airway Mallampati: I  TM Distance: >3 FB Neck ROM: Full    Dental no notable dental hx.    Pulmonary asthma    Pulmonary exam normal        Cardiovascular hypertension, Pt. on medications  Rhythm:Regular Rate:Normal     Neuro/Psych   Anxiety Depression    negative neurological ROS     GI/Hepatic negative GI ROS, Neg liver ROS,,,  Endo/Other  negative endocrine ROS    Renal/GU ESRF and DialysisRenal disease  negative genitourinary   Musculoskeletal negative musculoskeletal ROS (+)    Abdominal Normal abdominal exam  (+)   Peds  (+) ADHD Hematology  (+) Blood dyscrasia, anemia Lab Results      Component                Value               Date                      WBC                      7.3                 10/23/2021                HGB                      13.6                12/22/2021                HCT                      40.0                12/22/2021                MCV                      96.1                10/23/2021                PLT                      198                 10/23/2021    Lab Results      Component                Value               Date                      NA                       141                 12/22/2021                K  5.0                 12/22/2021                CO2                      30                  10/23/2021                GLUCOSE                  75                  12/22/2021                BUN                      62 (H)              12/22/2021                CREATININE               13.20 (H)           12/22/2021                CALCIUM                  10.0                10/23/2021                GFRNONAA                 7 (L)               10/23/2021                       Anesthesia Other Findings   Reproductive/Obstetrics                             Anesthesia Physical Anesthesia Plan  ASA: 3  Anesthesia Plan: MAC   Post-op Pain Management: Regional block*   Induction: Intravenous  PONV Risk Score and Plan: 1 and Ondansetron, Dexamethasone, Propofol infusion, Midazolam and Treatment may vary due to age or medical condition  Airway Management Planned: Simple Face Mask, Natural Airway and Nasal Cannula  Additional Equipment: None  Intra-op Plan:   Post-operative Plan:   Informed Consent: I have reviewed the patients History and Physical, chart, labs and discussed the procedure including the risks, benefits and alternatives for the proposed anesthesia with the patient or authorized representative who has indicated his/her understanding and acceptance.     Dental advisory given  Plan Discussed with: CRNA  Anesthesia Plan Comments:        Anesthesia Quick Evaluation

## 2021-12-23 ENCOUNTER — Encounter (HOSPITAL_COMMUNITY): Payer: Self-pay | Admitting: Vascular Surgery

## 2021-12-23 DIAGNOSIS — Z992 Dependence on renal dialysis: Secondary | ICD-10-CM | POA: Diagnosis not present

## 2021-12-23 DIAGNOSIS — D689 Coagulation defect, unspecified: Secondary | ICD-10-CM | POA: Diagnosis not present

## 2021-12-23 DIAGNOSIS — N186 End stage renal disease: Secondary | ICD-10-CM | POA: Diagnosis not present

## 2021-12-23 DIAGNOSIS — N2581 Secondary hyperparathyroidism of renal origin: Secondary | ICD-10-CM | POA: Diagnosis not present

## 2021-12-23 DIAGNOSIS — Z4800 Encounter for change or removal of nonsurgical wound dressing: Secondary | ICD-10-CM | POA: Diagnosis not present

## 2021-12-23 DIAGNOSIS — D631 Anemia in chronic kidney disease: Secondary | ICD-10-CM | POA: Diagnosis not present

## 2021-12-24 DIAGNOSIS — Z4800 Encounter for change or removal of nonsurgical wound dressing: Secondary | ICD-10-CM | POA: Diagnosis not present

## 2021-12-24 DIAGNOSIS — D689 Coagulation defect, unspecified: Secondary | ICD-10-CM | POA: Diagnosis not present

## 2021-12-24 DIAGNOSIS — D631 Anemia in chronic kidney disease: Secondary | ICD-10-CM | POA: Diagnosis not present

## 2021-12-24 DIAGNOSIS — N2581 Secondary hyperparathyroidism of renal origin: Secondary | ICD-10-CM | POA: Diagnosis not present

## 2021-12-24 DIAGNOSIS — N186 End stage renal disease: Secondary | ICD-10-CM | POA: Diagnosis not present

## 2021-12-24 DIAGNOSIS — Z992 Dependence on renal dialysis: Secondary | ICD-10-CM | POA: Diagnosis not present

## 2021-12-26 DIAGNOSIS — Z992 Dependence on renal dialysis: Secondary | ICD-10-CM | POA: Diagnosis not present

## 2021-12-26 DIAGNOSIS — N186 End stage renal disease: Secondary | ICD-10-CM | POA: Diagnosis not present

## 2021-12-26 DIAGNOSIS — N2581 Secondary hyperparathyroidism of renal origin: Secondary | ICD-10-CM | POA: Diagnosis not present

## 2021-12-26 DIAGNOSIS — D631 Anemia in chronic kidney disease: Secondary | ICD-10-CM | POA: Diagnosis not present

## 2021-12-26 DIAGNOSIS — Z4800 Encounter for change or removal of nonsurgical wound dressing: Secondary | ICD-10-CM | POA: Diagnosis not present

## 2021-12-26 DIAGNOSIS — D689 Coagulation defect, unspecified: Secondary | ICD-10-CM | POA: Diagnosis not present

## 2021-12-29 DIAGNOSIS — N186 End stage renal disease: Secondary | ICD-10-CM | POA: Diagnosis not present

## 2021-12-29 DIAGNOSIS — D631 Anemia in chronic kidney disease: Secondary | ICD-10-CM | POA: Diagnosis not present

## 2021-12-29 DIAGNOSIS — N2581 Secondary hyperparathyroidism of renal origin: Secondary | ICD-10-CM | POA: Diagnosis not present

## 2021-12-29 DIAGNOSIS — D689 Coagulation defect, unspecified: Secondary | ICD-10-CM | POA: Diagnosis not present

## 2021-12-29 DIAGNOSIS — Z4800 Encounter for change or removal of nonsurgical wound dressing: Secondary | ICD-10-CM | POA: Diagnosis not present

## 2021-12-29 DIAGNOSIS — Z992 Dependence on renal dialysis: Secondary | ICD-10-CM | POA: Diagnosis not present

## 2021-12-31 DIAGNOSIS — Z4800 Encounter for change or removal of nonsurgical wound dressing: Secondary | ICD-10-CM | POA: Diagnosis not present

## 2021-12-31 DIAGNOSIS — D689 Coagulation defect, unspecified: Secondary | ICD-10-CM | POA: Diagnosis not present

## 2021-12-31 DIAGNOSIS — D631 Anemia in chronic kidney disease: Secondary | ICD-10-CM | POA: Diagnosis not present

## 2021-12-31 DIAGNOSIS — N2581 Secondary hyperparathyroidism of renal origin: Secondary | ICD-10-CM | POA: Diagnosis not present

## 2021-12-31 DIAGNOSIS — Z992 Dependence on renal dialysis: Secondary | ICD-10-CM | POA: Diagnosis not present

## 2021-12-31 DIAGNOSIS — N186 End stage renal disease: Secondary | ICD-10-CM | POA: Diagnosis not present

## 2022-01-01 ENCOUNTER — Other Ambulatory Visit: Payer: Self-pay | Admitting: *Deleted

## 2022-01-01 DIAGNOSIS — N186 End stage renal disease: Secondary | ICD-10-CM

## 2022-01-02 DIAGNOSIS — D631 Anemia in chronic kidney disease: Secondary | ICD-10-CM | POA: Diagnosis not present

## 2022-01-02 DIAGNOSIS — N2581 Secondary hyperparathyroidism of renal origin: Secondary | ICD-10-CM | POA: Diagnosis not present

## 2022-01-02 DIAGNOSIS — D689 Coagulation defect, unspecified: Secondary | ICD-10-CM | POA: Diagnosis not present

## 2022-01-02 DIAGNOSIS — Z992 Dependence on renal dialysis: Secondary | ICD-10-CM | POA: Diagnosis not present

## 2022-01-02 DIAGNOSIS — Z4800 Encounter for change or removal of nonsurgical wound dressing: Secondary | ICD-10-CM | POA: Diagnosis not present

## 2022-01-02 DIAGNOSIS — N186 End stage renal disease: Secondary | ICD-10-CM | POA: Diagnosis not present

## 2022-01-04 DIAGNOSIS — N2581 Secondary hyperparathyroidism of renal origin: Secondary | ICD-10-CM | POA: Diagnosis not present

## 2022-01-04 DIAGNOSIS — Z4800 Encounter for change or removal of nonsurgical wound dressing: Secondary | ICD-10-CM | POA: Diagnosis not present

## 2022-01-04 DIAGNOSIS — D689 Coagulation defect, unspecified: Secondary | ICD-10-CM | POA: Diagnosis not present

## 2022-01-04 DIAGNOSIS — D631 Anemia in chronic kidney disease: Secondary | ICD-10-CM | POA: Diagnosis not present

## 2022-01-04 DIAGNOSIS — N186 End stage renal disease: Secondary | ICD-10-CM | POA: Diagnosis not present

## 2022-01-04 DIAGNOSIS — Z992 Dependence on renal dialysis: Secondary | ICD-10-CM | POA: Diagnosis not present

## 2022-01-06 DIAGNOSIS — Z4800 Encounter for change or removal of nonsurgical wound dressing: Secondary | ICD-10-CM | POA: Diagnosis not present

## 2022-01-06 DIAGNOSIS — N186 End stage renal disease: Secondary | ICD-10-CM | POA: Diagnosis not present

## 2022-01-06 DIAGNOSIS — D689 Coagulation defect, unspecified: Secondary | ICD-10-CM | POA: Diagnosis not present

## 2022-01-06 DIAGNOSIS — Z992 Dependence on renal dialysis: Secondary | ICD-10-CM | POA: Diagnosis not present

## 2022-01-06 DIAGNOSIS — D631 Anemia in chronic kidney disease: Secondary | ICD-10-CM | POA: Diagnosis not present

## 2022-01-06 DIAGNOSIS — N2581 Secondary hyperparathyroidism of renal origin: Secondary | ICD-10-CM | POA: Diagnosis not present

## 2022-01-09 DIAGNOSIS — Z4800 Encounter for change or removal of nonsurgical wound dressing: Secondary | ICD-10-CM | POA: Diagnosis not present

## 2022-01-09 DIAGNOSIS — N2581 Secondary hyperparathyroidism of renal origin: Secondary | ICD-10-CM | POA: Diagnosis not present

## 2022-01-09 DIAGNOSIS — N186 End stage renal disease: Secondary | ICD-10-CM | POA: Diagnosis not present

## 2022-01-09 DIAGNOSIS — Z992 Dependence on renal dialysis: Secondary | ICD-10-CM | POA: Diagnosis not present

## 2022-01-09 DIAGNOSIS — D689 Coagulation defect, unspecified: Secondary | ICD-10-CM | POA: Diagnosis not present

## 2022-01-09 DIAGNOSIS — D631 Anemia in chronic kidney disease: Secondary | ICD-10-CM | POA: Diagnosis not present

## 2022-01-12 DIAGNOSIS — D631 Anemia in chronic kidney disease: Secondary | ICD-10-CM | POA: Diagnosis not present

## 2022-01-12 DIAGNOSIS — N186 End stage renal disease: Secondary | ICD-10-CM | POA: Diagnosis not present

## 2022-01-12 DIAGNOSIS — Z992 Dependence on renal dialysis: Secondary | ICD-10-CM | POA: Diagnosis not present

## 2022-01-12 DIAGNOSIS — N2581 Secondary hyperparathyroidism of renal origin: Secondary | ICD-10-CM | POA: Diagnosis not present

## 2022-01-12 DIAGNOSIS — Z4800 Encounter for change or removal of nonsurgical wound dressing: Secondary | ICD-10-CM | POA: Diagnosis not present

## 2022-01-12 DIAGNOSIS — D689 Coagulation defect, unspecified: Secondary | ICD-10-CM | POA: Diagnosis not present

## 2022-01-14 DIAGNOSIS — N186 End stage renal disease: Secondary | ICD-10-CM | POA: Diagnosis not present

## 2022-01-14 DIAGNOSIS — Z992 Dependence on renal dialysis: Secondary | ICD-10-CM | POA: Diagnosis not present

## 2022-01-14 DIAGNOSIS — N2581 Secondary hyperparathyroidism of renal origin: Secondary | ICD-10-CM | POA: Diagnosis not present

## 2022-01-14 DIAGNOSIS — Z4800 Encounter for change or removal of nonsurgical wound dressing: Secondary | ICD-10-CM | POA: Diagnosis not present

## 2022-01-14 DIAGNOSIS — D631 Anemia in chronic kidney disease: Secondary | ICD-10-CM | POA: Diagnosis not present

## 2022-01-14 DIAGNOSIS — D689 Coagulation defect, unspecified: Secondary | ICD-10-CM | POA: Diagnosis not present

## 2022-01-15 DIAGNOSIS — Z992 Dependence on renal dialysis: Secondary | ICD-10-CM | POA: Diagnosis not present

## 2022-01-15 DIAGNOSIS — N186 End stage renal disease: Secondary | ICD-10-CM | POA: Diagnosis not present

## 2022-01-15 DIAGNOSIS — I129 Hypertensive chronic kidney disease with stage 1 through stage 4 chronic kidney disease, or unspecified chronic kidney disease: Secondary | ICD-10-CM | POA: Diagnosis not present

## 2022-01-16 DIAGNOSIS — N186 End stage renal disease: Secondary | ICD-10-CM | POA: Diagnosis not present

## 2022-01-16 DIAGNOSIS — D631 Anemia in chronic kidney disease: Secondary | ICD-10-CM | POA: Diagnosis not present

## 2022-01-16 DIAGNOSIS — N2581 Secondary hyperparathyroidism of renal origin: Secondary | ICD-10-CM | POA: Diagnosis not present

## 2022-01-16 DIAGNOSIS — Z992 Dependence on renal dialysis: Secondary | ICD-10-CM | POA: Diagnosis not present

## 2022-01-16 DIAGNOSIS — D689 Coagulation defect, unspecified: Secondary | ICD-10-CM | POA: Diagnosis not present

## 2022-01-16 DIAGNOSIS — Z4800 Encounter for change or removal of nonsurgical wound dressing: Secondary | ICD-10-CM | POA: Diagnosis not present

## 2022-01-19 DIAGNOSIS — Z992 Dependence on renal dialysis: Secondary | ICD-10-CM | POA: Diagnosis not present

## 2022-01-19 DIAGNOSIS — N186 End stage renal disease: Secondary | ICD-10-CM | POA: Diagnosis not present

## 2022-01-19 DIAGNOSIS — D631 Anemia in chronic kidney disease: Secondary | ICD-10-CM | POA: Diagnosis not present

## 2022-01-19 DIAGNOSIS — N2581 Secondary hyperparathyroidism of renal origin: Secondary | ICD-10-CM | POA: Diagnosis not present

## 2022-01-19 DIAGNOSIS — Z4800 Encounter for change or removal of nonsurgical wound dressing: Secondary | ICD-10-CM | POA: Diagnosis not present

## 2022-01-19 DIAGNOSIS — D689 Coagulation defect, unspecified: Secondary | ICD-10-CM | POA: Diagnosis not present

## 2022-01-19 NOTE — Progress Notes (Unsigned)
POST OPERATIVE OFFICE NOTE    CC:  F/u for surgery  HPI:  This is a 36 y.o. male who is s/p left BC AVF on 12/22/2021 by Dr. Carlis Abbott.  He had previously undergone right BC AVF in October 2021 also by Dr. Carlis Abbott.    He is currently dialyzing via [place] placed by IR on 11/21/2021.  Pt is right handed.    Pt states he does not have pain/numbness in the left hand.  He states that shortly after surgery, he was not able to feel the fistula.  He states that his uncontrolled high blood pressure is why he has kidney failure and is in process of working on transplant list.   The pt is on dialysis M/W/F at [place] location.   No Known Allergies  Current Outpatient Medications  Medication Sig Dispense Refill   acetaminophen (TYLENOL) 500 MG tablet Take 1,000 mg by mouth every 6 (six) hours as needed for moderate pain.     amLODipine (NORVASC) 10 MG tablet Take 1 tablet (10 mg total) by mouth at bedtime. 90 tablet 1   buPROPion (WELLBUTRIN XL) 150 MG 24 hr tablet Take 150 mg by mouth in the morning.     cloNIDine (CATAPRES) 0.1 MG tablet Take 0.1 mg by mouth 2 (two) times daily.     fluticasone (FLONASE) 50 MCG/ACT nasal spray Place 2 sprays into both nostrils daily. (Patient taking differently: Place 2 sprays into both nostrils in the morning.) 16 g 0   hydrOXYzine (VISTARIL) 25 MG capsule Take 25 mg by mouth at bedtime.     ibuprofen (ADVIL) 200 MG tablet Take 200-400 mg by mouth daily as needed for headache or mild pain.     lidocaine-prilocaine (EMLA) cream Apply 1 application  topically daily as needed (access fistula).     losartan (COZAAR) 50 MG tablet Take 50 mg by mouth in the morning.     MELATONIN PO Take 1-2 tablets by mouth at bedtime as needed (sleep).     Metoprolol Tartrate 75 MG TABS TAKE 1 TABLET BY MOUTH TWICE DAILY. 180 tablet 3   Multiple Vitamin (MULTIVITAMIN WITH MINERALS) TABS tablet Take 1 tablet by mouth in the morning.     Naphazoline HCl (CLEAR EYES OP) Place 1  drop into both eyes daily as needed (irritation).     oxyCODONE-acetaminophen (PERCOCET/ROXICET) 5-325 MG tablet Take 1 tablet by mouth every 6 (six) hours as needed. 12 tablet 0   sertraline (ZOLOFT) 100 MG tablet Take 100 mg by mouth in the morning.     sevelamer carbonate (RENVELA) 800 MG tablet Take 1 tablet (800 mg total) by mouth 3 (three) times daily with meals. (Patient not taking: Reported on 11/20/2021) 90 tablet 3   sildenafil (VIAGRA) 50 MG tablet 1 tab PO 1/2 hr prior to sex PRN.  Limit use to 1 tab/24 hr 10 tablet 2   sucroferric oxyhydroxide (VELPHORO) 500 MG chewable tablet Chew 500-1,000 mg by mouth See admin instructions. 1000 mg with 3 times daily meals, 500 mg with snacks     No current facility-administered medications for this visit.     ROS:  See HPI  Physical Exam:  Today's Vitals   01/20/22 0921  BP: 110/73  Pulse: 67  Resp: 20  Temp: 98 F (36.7 C)  TempSrc: Temporal  SpO2: 97%  Weight: 185 lb (83.9 kg)  Height: 5\' 8"  (1.727 m)   Body mass index is 28.13 kg/m.   Incision:  healed  nicely Extremities:   There is a palpable left radial pulse.   Motor and sensory are in tact.   There is not a thrill/bruit present.     Dialysis Duplex on 01/20/2022: Clotted fistula   Assessment/Plan:  This is a 36 y.o. male who is s/p:  left BC AVF on 12/22/2021 by Dr. Carlis Abbott.  -the pt does not have evidence of steal. -unfortunately, his fistula is clotted and has probably been down since shortly after surgery.  Reviewed pt's dialysis duplex and previous vein mapping today with Dr. Carlis Abbott and will proceed with left 1st stage BVT.  I discussed with pt that this will be staged in 2 procedures if the fistula matures.  He also understands that if the vein is not adequate in the OR, a graft may need to be placed.  -questions answered and pt wants to proceed.    Leontine Locket, [facility] [phone]  Clinic MD:  Carlis Abbott

## 2022-01-20 ENCOUNTER — Ambulatory Visit (INDEPENDENT_AMBULATORY_CARE_PROVIDER_SITE_OTHER): Payer: Medicare Other | Admitting: Physician Assistant

## 2022-01-20 ENCOUNTER — Other Ambulatory Visit: Payer: Self-pay

## 2022-01-20 ENCOUNTER — Ambulatory Visit (HOSPITAL_COMMUNITY)
Admission: RE | Admit: 2022-01-20 | Discharge: 2022-01-20 | Disposition: A | Discharge status: Home / Self Care | Payer: Medicare Other | Source: Ambulatory Visit | Attending: Vascular Surgery | Admitting: Vascular Surgery

## 2022-01-20 VITALS — BP 110/73 | HR 67 | Temp 98.0°F | Resp 20 | Ht 68.0 in | Wt 185.0 lb

## 2022-01-20 DIAGNOSIS — N186 End stage renal disease: Secondary | ICD-10-CM | POA: Diagnosis not present

## 2022-01-20 DIAGNOSIS — Z992 Dependence on renal dialysis: Secondary | ICD-10-CM | POA: Insufficient documentation

## 2022-01-21 DIAGNOSIS — N2581 Secondary hyperparathyroidism of renal origin: Secondary | ICD-10-CM | POA: Diagnosis not present

## 2022-01-21 DIAGNOSIS — Z4800 Encounter for change or removal of nonsurgical wound dressing: Secondary | ICD-10-CM | POA: Diagnosis not present

## 2022-01-21 DIAGNOSIS — Z992 Dependence on renal dialysis: Secondary | ICD-10-CM | POA: Diagnosis not present

## 2022-01-21 DIAGNOSIS — D689 Coagulation defect, unspecified: Secondary | ICD-10-CM | POA: Diagnosis not present

## 2022-01-21 DIAGNOSIS — N186 End stage renal disease: Secondary | ICD-10-CM | POA: Diagnosis not present

## 2022-01-21 DIAGNOSIS — D631 Anemia in chronic kidney disease: Secondary | ICD-10-CM | POA: Diagnosis not present

## 2022-01-23 DIAGNOSIS — N186 End stage renal disease: Secondary | ICD-10-CM | POA: Diagnosis not present

## 2022-01-23 DIAGNOSIS — Z4800 Encounter for change or removal of nonsurgical wound dressing: Secondary | ICD-10-CM | POA: Diagnosis not present

## 2022-01-23 DIAGNOSIS — D631 Anemia in chronic kidney disease: Secondary | ICD-10-CM | POA: Diagnosis not present

## 2022-01-23 DIAGNOSIS — Z992 Dependence on renal dialysis: Secondary | ICD-10-CM | POA: Diagnosis not present

## 2022-01-23 DIAGNOSIS — D689 Coagulation defect, unspecified: Secondary | ICD-10-CM | POA: Diagnosis not present

## 2022-01-23 DIAGNOSIS — N2581 Secondary hyperparathyroidism of renal origin: Secondary | ICD-10-CM | POA: Diagnosis not present

## 2022-01-26 ENCOUNTER — Other Ambulatory Visit: Payer: Self-pay

## 2022-01-26 ENCOUNTER — Encounter (HOSPITAL_COMMUNITY): Payer: Self-pay | Admitting: Vascular Surgery

## 2022-01-26 DIAGNOSIS — Z4800 Encounter for change or removal of nonsurgical wound dressing: Secondary | ICD-10-CM | POA: Diagnosis not present

## 2022-01-26 DIAGNOSIS — N186 End stage renal disease: Secondary | ICD-10-CM | POA: Diagnosis not present

## 2022-01-26 DIAGNOSIS — D631 Anemia in chronic kidney disease: Secondary | ICD-10-CM | POA: Diagnosis not present

## 2022-01-26 DIAGNOSIS — N2581 Secondary hyperparathyroidism of renal origin: Secondary | ICD-10-CM | POA: Diagnosis not present

## 2022-01-26 DIAGNOSIS — Z992 Dependence on renal dialysis: Secondary | ICD-10-CM | POA: Diagnosis not present

## 2022-01-26 DIAGNOSIS — D689 Coagulation defect, unspecified: Secondary | ICD-10-CM | POA: Diagnosis not present

## 2022-01-26 NOTE — Anesthesia Preprocedure Evaluation (Signed)
Anesthesia Evaluation  Patient identified by MRN, date of birth, ID band Patient awake    Reviewed: Allergy & Precautions, NPO status , Patient's Chart, lab work & pertinent test results  History of Anesthesia Complications Negative for: history of anesthetic complications  Airway Mallampati: III  TM Distance: >3 FB Neck ROM: Full    Dental no notable dental hx.    Pulmonary neg shortness of breath, asthma , neg sleep apnea, neg COPD, neg recent URI   Pulmonary exam normal breath sounds clear to auscultation       Cardiovascular hypertension, Pt. on medications and Pt. on home beta blockers (-) angina (-) Past MI, (-) Cardiac Stents and (-) CABG  Rhythm:Regular Rate:Normal  TTE 08/27/2020: SUMMARY  The left ventricular size is normal.  There is normal left ventricular wall thickness.  LV ejection fraction = 55-60%.  Left ventricular systolic function is normal.  The left ventricular wall motion is normal.  Left ventricular filling pattern is normal.  The right ventricle is normal in size and function.  The left atrial size is normal.  Right atrial size is normal.  There is no significant valvular stenosis or regurgitation.  IVC size was normal.  There is no pericardial effusion.     Neuro/Psych  PSYCHIATRIC DISORDERS Anxiety Depression    negative neurological ROS     GI/Hepatic negative GI ROS, Neg liver ROS,,,  Endo/Other  negative endocrine ROS    Renal/GU ESRF and DialysisRenal disease (MWF)     Musculoskeletal   Abdominal   Peds  Hematology  (+) Blood dyscrasia (anemia of chronic disease), anemia   Anesthesia Other Findings 36 y.o. male who is s/p left BC AVF on 12/22/2021 by Dr. Carlis Abbott.  He had previously undergone right BC AVF in October 2021 also by Dr. Carlis Abbott. The fistula is clotted. He will undergo 1st stage BVT.   He is currently dialyzing via [place] placed by IR on 11/21/2021.    Reproductive/Obstetrics                             Anesthesia Physical Anesthesia Plan  ASA: 3  Anesthesia Plan: Regional and MAC   Post-op Pain Management: Regional block*   Induction: Intravenous  PONV Risk Score and Plan: 1 and Ondansetron and Treatment may vary due to age or medical condition  Airway Management Planned: Natural Airway and Simple Face Mask  Additional Equipment:   Intra-op Plan:   Post-operative Plan:   Informed Consent: I have reviewed the patients History and Physical, chart, labs and discussed the procedure including the risks, benefits and alternatives for the proposed anesthesia with the patient or authorized representative who has indicated his/her understanding and acceptance.     Dental advisory given  Plan Discussed with: CRNA and Anesthesiologist  Anesthesia Plan Comments: (Discussed potential risks of nerve blocks including, but not limited to, infection, bleeding, nerve damage, seizures, pneumothorax, respiratory depression, and potential failure of the block. Alternatives to nerve blocks discussed. All questions answered.  Discussed with patient risks of MAC including, but not limited to, minor pain or discomfort, hearing people in the room, and possible need for backup general anesthesia. Risks for general anesthesia also discussed including, but not limited to, sore throat, hoarse voice, chipped/damaged teeth, injury to vocal cords, nausea and vomiting, allergic reactions, lung infection, heart attack, stroke, and death. All questions answered. )        Anesthesia Quick Evaluation

## 2022-01-26 NOTE — Progress Notes (Addendum)
PCP - Dr. Aundra Dubin Cardiologist - Denies EKG - 11/23 Chest x-ray -  ECHO - 10/21 Cardiac Cath - Denies CPAP - Denies DM - Denies ERAS Protcol - NI COVID TEST- NI  Anesthesia review: Yes  -------------  SDW INSTRUCTIONS:  Your procedure is scheduled on 12th. Please report to [facility] Main Entrance "A" at 530 A.M., and check in at the Admitting office. Call this number if you have problems the morning of surgery: [phone]   Remember: Do not eat or drink after midnight the night before your surgery    Medications to take morning of surgery with a sip of water include: Wellbutrin, Clonidine, Flonase, Metoprolol, zoloft PRN Tylenol, eye drops  As of today, STOP taking any Aspirin (unless otherwise instructed by your surgeon), Aleve, Naproxen, Ibuprofen, Motrin, Advil, Goody's, BC's, all herbal medications, fish oil, and all vitamins.    The Morning of Surgery Do not wear jewelry, make-up or nail polish. Do not wear lotions, powders, or perfumes/colognes, or deodorant Do not bring valuables to the hospital. [facility] is not responsible for any belongings or valuables.  If you are a smoker, DO NOT Smoke 24 hours prior to surgery  If you wear a CPAP at night please bring your mask the morning of surgery   Remember that you must have someone to transport you home after your surgery, and remain with you for 24 hours if you are discharged the same day.  Please bring cases for contacts, glasses, hearing aids, dentures or bridgework because it cannot be worn into surgery.   Patients discharged the day of surgery will not be allowed to drive home.   Please shower the NIGHT BEFORE/MORNING OF SURGERY (use antibacterial soap like DIAL soap if possible). Wear comfortable clothes the morning of surgery. Oral Hygiene is also important to reduce your risk of infection.  Remember - BRUSH YOUR TEETH THE MORNING OF SURGERY WITH YOUR REGULAR TOOTHPASTE  Patient denies shortness of  breath, fever, cough and chest pain.

## 2022-01-27 ENCOUNTER — Other Ambulatory Visit: Payer: Self-pay

## 2022-01-27 ENCOUNTER — Encounter (HOSPITAL_COMMUNITY)
Admission: RE | Disposition: A | Discharge status: Home / Self Care | Payer: Self-pay | Source: Home / Self Care | Attending: Vascular Surgery

## 2022-01-27 ENCOUNTER — Encounter (HOSPITAL_COMMUNITY): Payer: Self-pay | Admitting: Vascular Surgery

## 2022-01-27 ENCOUNTER — Ambulatory Visit (HOSPITAL_COMMUNITY)
Admission: RE | Admit: 2022-01-27 | Discharge: 2022-01-27 | Disposition: A | Discharge status: Home / Self Care | Payer: Medicare Other | Attending: Vascular Surgery | Admitting: Vascular Surgery

## 2022-01-27 ENCOUNTER — Ambulatory Visit (HOSPITAL_BASED_OUTPATIENT_CLINIC_OR_DEPARTMENT_OTHER): Payer: Medicare Other | Admitting: Certified Registered Nurse Anesthetist

## 2022-01-27 ENCOUNTER — Ambulatory Visit (HOSPITAL_COMMUNITY): Payer: Medicare Other | Admitting: Certified Registered Nurse Anesthetist

## 2022-01-27 DIAGNOSIS — D631 Anemia in chronic kidney disease: Secondary | ICD-10-CM | POA: Diagnosis not present

## 2022-01-27 DIAGNOSIS — Z7682 Awaiting organ transplant status: Secondary | ICD-10-CM | POA: Diagnosis not present

## 2022-01-27 DIAGNOSIS — I120 Hypertensive chronic kidney disease with stage 5 chronic kidney disease or end stage renal disease: Secondary | ICD-10-CM

## 2022-01-27 DIAGNOSIS — G8918 Other acute postprocedural pain: Secondary | ICD-10-CM | POA: Diagnosis not present

## 2022-01-27 DIAGNOSIS — N186 End stage renal disease: Secondary | ICD-10-CM

## 2022-01-27 DIAGNOSIS — N185 Chronic kidney disease, stage 5: Secondary | ICD-10-CM | POA: Diagnosis not present

## 2022-01-27 DIAGNOSIS — Z992 Dependence on renal dialysis: Secondary | ICD-10-CM | POA: Diagnosis not present

## 2022-01-27 HISTORY — PX: BASCILIC VEIN TRANSPOSITION: SHX5742

## 2022-01-27 LAB — POCT I-STAT, CHEM 8
BUN: 42 mg/dL — ABNORMAL HIGH (ref 6–20)
Calcium, Ion: 1.12 mmol/L — ABNORMAL LOW (ref 1.15–1.40)
Chloride: 96 mmol/L — ABNORMAL LOW (ref 98–111)
Creatinine, Ser: 12.2 mg/dL — ABNORMAL HIGH (ref 0.61–1.24)
Glucose, Bld: 97 mg/dL (ref 70–99)
HCT: 34 % — ABNORMAL LOW (ref 39.0–52.0)
Hemoglobin: 11.6 g/dL — ABNORMAL LOW (ref 13.0–17.0)
Potassium: 4.3 mmol/L (ref 3.5–5.1)
Sodium: 135 mmol/L (ref 135–145)
TCO2: 30 mmol/L (ref 22–32)

## 2022-01-27 SURGERY — TRANSPOSITION, VEIN, BASILIC
Anesthesia: Regional | Site: Arm Upper | Laterality: Left

## 2022-01-27 MED ORDER — FENTANYL CITRATE (PF) 100 MCG/2ML IJ SOLN
INTRAMUSCULAR | Status: DC | PRN
  Administered 2022-01-27: 100 ug via INTRAVENOUS

## 2022-01-27 MED ORDER — CHLORHEXIDINE GLUCONATE 0.12 % MT SOLN
OROMUCOSAL | Status: AC
  Filled 2022-01-27: qty 15

## 2022-01-27 MED ORDER — LIDOCAINE-EPINEPHRINE (PF) 1 %-1:200000 IJ SOLN
INTRAMUSCULAR | Status: DC | PRN
  Administered 2022-01-27: 3 mL via INTRADERMAL

## 2022-01-27 MED ORDER — SODIUM CHLORIDE (PF) 0.9 % IJ SOLN
INTRAMUSCULAR | Status: DC | PRN
  Administered 2022-01-27: 2 mL via SURGICAL_CAVITY

## 2022-01-27 MED ORDER — SODIUM CHLORIDE 0.9 % IV SOLN: INTRAVENOUS | Status: DC

## 2022-01-27 MED ORDER — MIDAZOLAM HCL 2 MG/2ML IJ SOLN
INTRAMUSCULAR | Status: DC | PRN
  Administered 2022-01-27: 2 mg via INTRAVENOUS

## 2022-01-27 MED ORDER — MIDAZOLAM HCL 2 MG/2ML IJ SOLN
INTRAMUSCULAR | Status: AC
  Filled 2022-01-27: qty 2

## 2022-01-27 MED ORDER — CHLORHEXIDINE GLUCONATE 0.12 % MT SOLN: 15.0000 mL | Freq: Once | OROMUCOSAL | Status: DC

## 2022-01-27 MED ORDER — CEFAZOLIN SODIUM-DEXTROSE 2-4 GM/100ML-% IV SOLN
2.0000 g | INTRAVENOUS | Status: AC
  Administered 2022-01-27: 2 g via INTRAVENOUS

## 2022-01-27 MED ORDER — METOPROLOL TARTRATE 50 MG PO TABS
ORAL_TABLET | ORAL | Status: AC
  Filled 2022-01-27: qty 2

## 2022-01-27 MED ORDER — ORAL CARE MOUTH RINSE: 15.0000 mL | Freq: Once | OROMUCOSAL | Status: DC

## 2022-01-27 MED ORDER — OXYCODONE-ACETAMINOPHEN 5-325 MG PO TABS: 1.0000 | ORAL_TABLET | Freq: Four times a day (QID) | ORAL | 0 refills | Status: DC | PRN

## 2022-01-27 MED ORDER — OXYCODONE HCL 5 MG/5ML PO SOLN: 5.0000 mg | Freq: Once | ORAL | Status: DC | PRN

## 2022-01-27 MED ORDER — FENTANYL CITRATE (PF) 100 MCG/2ML IJ SOLN: 25.0000 ug | INTRAMUSCULAR | Status: DC | PRN

## 2022-01-27 MED ORDER — BUPIVACAINE HCL (PF) 0.25 % IJ SOLN
INTRAMUSCULAR | Status: DC | PRN
  Administered 2022-01-27: 10 mL

## 2022-01-27 MED ORDER — FENTANYL CITRATE (PF) 250 MCG/5ML IJ SOLN
INTRAMUSCULAR | Status: AC
  Filled 2022-01-27: qty 5

## 2022-01-27 MED ORDER — PROPOFOL 1000 MG/100ML IV EMUL
INTRAVENOUS | Status: AC
  Filled 2022-01-27: qty 100

## 2022-01-27 MED ORDER — PAPAVERINE HCL 30 MG/ML IJ SOLN
INTRAMUSCULAR | Status: AC
  Filled 2022-01-27: qty 2

## 2022-01-27 MED ORDER — SODIUM CHLORIDE 0.9 % IV SOLN: INTRAVENOUS | Status: DC | PRN

## 2022-01-27 MED ORDER — DEXAMETHASONE SODIUM PHOSPHATE 10 MG/ML IJ SOLN
INTRAMUSCULAR | Status: DC | PRN
  Administered 2022-01-27: 5 mg via INTRAVENOUS

## 2022-01-27 MED ORDER — 0.9 % SODIUM CHLORIDE (POUR BTL) OPTIME
TOPICAL | Status: DC | PRN
  Administered 2022-01-27: 1000 mL

## 2022-01-27 MED ORDER — ONDANSETRON HCL 4 MG/2ML IJ SOLN
INTRAMUSCULAR | Status: DC | PRN
  Administered 2022-01-27: 4 mg via INTRAVENOUS

## 2022-01-27 MED ORDER — ROPIVACAINE HCL 5 MG/ML IJ SOLN
INTRAMUSCULAR | Status: DC | PRN
  Administered 2022-01-27: 30 mL via PERINEURAL

## 2022-01-27 MED ORDER — LIDOCAINE-EPINEPHRINE (PF) 1 %-1:200000 IJ SOLN
INTRAMUSCULAR | Status: AC
  Filled 2022-01-27: qty 30

## 2022-01-27 MED ORDER — OXYCODONE HCL 5 MG PO TABS: 5.0000 mg | ORAL_TABLET | Freq: Once | ORAL | Status: DC | PRN

## 2022-01-27 MED ORDER — ROPIVACAINE HCL 5 MG/ML IJ SOLN: INTRAMUSCULAR | Status: DC | PRN

## 2022-01-27 MED ORDER — PROMETHAZINE HCL 25 MG/ML IJ SOLN: 6.2500 mg | INTRAMUSCULAR | Status: DC | PRN

## 2022-01-27 MED ORDER — CHLORHEXIDINE GLUCONATE 4 % EX LIQD: 60.0000 mL | Freq: Once | CUTANEOUS | Status: DC

## 2022-01-27 MED ORDER — CEFAZOLIN SODIUM-DEXTROSE 2-4 GM/100ML-% IV SOLN
INTRAVENOUS | Status: AC
  Filled 2022-01-27: qty 100

## 2022-01-27 MED ORDER — PHENYLEPHRINE HCL (PRESSORS) 10 MG/ML IV SOLN
INTRAVENOUS | Status: DC | PRN
  Administered 2022-01-27: 40 ug via INTRAVENOUS

## 2022-01-27 MED ORDER — HEPARIN 6000 UNIT IRRIGATION SOLUTION
Status: AC
  Filled 2022-01-27: qty 500

## 2022-01-27 MED ORDER — HEPARIN 6000 UNIT IRRIGATION SOLUTION
Status: DC | PRN
  Administered 2022-01-27: 1

## 2022-01-27 MED ORDER — LIDOCAINE-EPINEPHRINE 1 %-1:100000 IJ SOLN
INTRAMUSCULAR | Status: AC
  Filled 2022-01-27: qty 1

## 2022-01-27 MED ORDER — PROPOFOL 500 MG/50ML IV EMUL
INTRAVENOUS | Status: DC | PRN
  Administered 2022-01-27: 200 mg via INTRAVENOUS
  Administered 2022-01-27: 10 mg via INTRAVENOUS
  Administered 2022-01-27: 30 ug/kg/min via INTRAVENOUS

## 2022-01-27 MED ORDER — HEPARIN SODIUM (PORCINE) 1000 UNIT/ML IJ SOLN
INTRAMUSCULAR | Status: DC | PRN
  Administered 2022-01-27: 2000 [IU] via INTRAVENOUS

## 2022-01-27 MED ORDER — METOPROLOL TARTRATE 12.5 MG HALF TABLET
ORAL_TABLET | ORAL | Status: AC
  Filled 2022-01-27: qty 2

## 2022-01-27 MED ORDER — METOPROLOL TARTRATE 50 MG PO TABS
75.0000 mg | ORAL_TABLET | Freq: Once | ORAL | Status: AC
  Administered 2022-01-27: 75 mg via ORAL

## 2022-01-27 MED ORDER — LACTATED RINGERS IV SOLN: INTRAVENOUS | Status: DC

## 2022-01-27 MED ORDER — HEPARIN SODIUM (PORCINE) 1000 UNIT/ML IJ SOLN
INTRAMUSCULAR | Status: AC
  Filled 2022-01-27: qty 2

## 2022-01-27 SURGICAL SUPPLY — 42 items
ARMBAND PINK RESTRICT EXTREMIT (MISCELLANEOUS) ×1 IMPLANT
BAG COUNTER SPONGE SURGICOUNT (BAG) ×1 IMPLANT
BNDG ELASTIC 4X5.8 VLCR STR LF (GAUZE/BANDAGES/DRESSINGS) ×1 IMPLANT
CANISTER SUCT 3000ML PPV (MISCELLANEOUS) ×1 IMPLANT
CLIP LIGATING EXTRA MED SLVR (CLIP) ×1 IMPLANT
CLIP LIGATING EXTRA SM BLUE (MISCELLANEOUS) ×1 IMPLANT
COVER PROBE W GEL 5X96 (DRAPES) ×1 IMPLANT
DERMABOND ADVANCED .7 DNX12 (GAUZE/BANDAGES/DRESSINGS) ×1 IMPLANT
ELECT REM PT RETURN 9FT ADLT (ELECTROSURGICAL) ×1
ELECTRODE REM PT RTRN 9FT ADLT (ELECTROSURGICAL) ×1 IMPLANT
GLOVE BIO SURGEON STRL SZ7.5 (GLOVE) ×1 IMPLANT
GLOVE BIOGEL PI IND STRL 8 (GLOVE) ×1 IMPLANT
GLOVE SRG 8 PF TXTR STRL LF DI (GLOVE) ×1 IMPLANT
GLOVE SURG POLYISO LF SZ8 (GLOVE) IMPLANT
GLOVE SURG UNDER POLY LF SZ8 (GLOVE) ×1
GOWN STRL REUS W/ TWL LRG LVL3 (GOWN DISPOSABLE) ×2 IMPLANT
GOWN STRL REUS W/TWL 2XL LVL3 (GOWN DISPOSABLE) ×2 IMPLANT
GOWN STRL REUS W/TWL LRG LVL3 (GOWN DISPOSABLE) ×2
KIT BASIN OR (CUSTOM PROCEDURE TRAY) ×1 IMPLANT
KIT TURNOVER KIT B (KITS) ×1 IMPLANT
LOOP VESSEL MINI RED (MISCELLANEOUS) IMPLANT
NDL HYPO 25GX1X1/2 BEV (NEEDLE) ×1 IMPLANT
NEEDLE HYPO 25GX1X1/2 BEV (NEEDLE) ×1 IMPLANT
NS IRRIG 1000ML POUR BTL (IV SOLUTION) ×1 IMPLANT
PACK CV ACCESS (CUSTOM PROCEDURE TRAY) ×1 IMPLANT
PAD ARMBOARD 7.5X6 YLW CONV (MISCELLANEOUS) ×2 IMPLANT
SLING ARM FOAM STRAP LRG (SOFTGOODS) IMPLANT
SLING ARM FOAM STRAP MED (SOFTGOODS) IMPLANT
SPIKE FLUID TRANSFER (MISCELLANEOUS) ×1 IMPLANT
SUT MNCRL AB 4-0 PS2 18 (SUTURE) ×1 IMPLANT
SUT PROLENE 6 0 BV (SUTURE) ×1 IMPLANT
SUT PROLENE 7 0 BV 1 (SUTURE) IMPLANT
SUT SILK 2 0 SH (SUTURE) IMPLANT
SUT SILK 2 0 SH CR/8 (SUTURE) ×1 IMPLANT
SUT VIC AB 2-0 CT1 27 (SUTURE) ×1
SUT VIC AB 2-0 CT1 TAPERPNT 27 (SUTURE) ×1 IMPLANT
SUT VIC AB 3-0 SH 27 (SUTURE) ×2
SUT VIC AB 3-0 SH 27X BRD (SUTURE) ×2 IMPLANT
SYR 20ML LL LF (SYRINGE) IMPLANT
TOWEL GREEN STERILE (TOWEL DISPOSABLE) ×1 IMPLANT
UNDERPAD 30X36 HEAVY ABSORB (UNDERPADS AND DIAPERS) ×1 IMPLANT
WATER STERILE IRR 1000ML POUR (IV SOLUTION) ×1 IMPLANT

## 2022-01-27 NOTE — H&P (Signed)
POST OPERATIVE OFFICE NOTE    Patient seen and examined in preop holding.  No complaints. No changes to medication history or physical exam since last seen in clinic. After discussing the risks and benefits of left arm brachiobasilic fistula v AV graft, Samuel Burns elected to proceed.  He is aware the brachiobasilic fistula will require superficialization at a later date if it matures appropriately.   Samuel John MD   CC:  F/u for surgery  HPI:  This is a 36 y.o. male who is s/p left BC AVF on 12/22/2021 by Dr. Carlis Burns.  He had previously undergone right BC AVF in October 2021 also by Dr. Carlis Burns.    He is currently dialyzing via [name] placed by IR on 11/21/2021.  Pt is right handed.    Pt states he does not have pain/numbness in the left hand.  He states that shortly after surgery, he was not able to feel the fistula.  He states that his uncontrolled high blood pressure is why he has kidney failure and is in process of working on transplant list.   The pt is on dialysis M/W/F at [place] location.   No Known Allergies  Current Facility-Administered Medications  Medication Dose Route Frequency Provider Last Rate Last Admin   0.9 %  sodium chloride infusion   Intravenous Continuous Samuel John, MD       ceFAZolin (ANCEF) 2-4 GM/100ML-% IVPB            ceFAZolin (ANCEF) IVPB 2g/100 mL premix  2 g Intravenous 30 min Pre-Op Samuel John, MD       chlorhexidine (HIBICLENS) 4 % liquid 4 Application  60 mL Topical Once Samuel John, MD       And   [START ON 01/28/2022] chlorhexidine (HIBICLENS) 4 % liquid 4 Application  60 mL Topical Once Samuel John, MD       chlorhexidine (PERIDEX) 0.12 % solution 15 mL  15 mL Mouth/Throat Once Samuel Sails, CRNA       Or   Oral care mouth rinse  15 mL Mouth Rinse Once Samuel Sails, CRNA       chlorhexidine (PERIDEX) 0.12 % solution            metoprolol tartrate (LOPRESSOR) 12.5 mg tablet            metoprolol  tartrate (LOPRESSOR) 50 MG tablet            Facility-Administered Medications Ordered in Other Encounters  Medication Dose Route Frequency Provider Last Rate Last Admin   ropivacaine (PF) 5 mg/mL (0.5%) (NAROPIN) injection   Epidural Anesthesia Intra-op Samuel Simmer, MD   30 mL at 01/27/22 0708     ROS:  See HPI  Physical Exam:  Today's Vitals   01/27/22 0600 01/27/22 0615  BP: 105/74   Pulse: 75   Resp: 20   Temp: 98.4 F (36.9 C)   SpO2: 100%   Weight: 81.2 kg   Height: 5\' 8"  (1.727 m)   PainSc:  0-No pain   Body mass index is 27.22 kg/m.   Incision:  healed nicely Extremities:   There is a palpable left radial pulse.   Motor and sensory are in tact.   There is not a thrill/bruit present.     Dialysis Duplex on 01/20/2022: Clotted fistula   Assessment/Plan:  This is a 36 y.o. male who is s/p:  left BC AVF on 12/22/2021 by Dr. Carlis Burns.  -  the pt does not have evidence of steal. -unfortunately, his fistula is clotted and has probably been down since shortly after surgery.  Reviewed pt's dialysis duplex and previous vein mapping today with Dr. Carlis Burns and will proceed with left 1st stage BVT.  I discussed with pt that this will be staged in 2 procedures if the fistula matures.  He also understands that if the vein is not adequate in the OR, a graft may need to be placed.  -questions answered and pt wants to proceed.    Leontine Locket, [facility] [phone]  Clinic MD:  Samuel Burns

## 2022-01-27 NOTE — Anesthesia Procedure Notes (Addendum)
Anesthesia Regional Block: Supraclavicular block   Pre-Anesthetic Checklist: , timeout performed,  Correct Patient, Correct Site, Correct Laterality,  Correct Procedure, Correct Position, site marked,  Risks and benefits discussed,  Surgical consent,  Pre-op evaluation,  At surgeon's request and post-op pain management  Laterality: Left  Prep: chloraprep       Needles:  Injection technique: Single-shot  Needle Type: Echogenic Stimulator Needle     Needle Length: 9cm  Needle Gauge: 21     Additional Needles:   Procedures:,,,, ultrasound used (permanent image in chart),,    Narrative:  Start time: 01/27/2022 7:05 AM End time: 01/27/2022 7:08 AM Injection made incrementally with aspirations every 5 mL.  Performed by: Personally  Anesthesiologist: Nilda Simmer, MD  Additional Notes: Discussed risks and benefits of nerve block including, but not limited to, prolonged and/or permanent nerve injury involving sensory and/or motor function. Monitors were applied and a time-out was performed. The nerve and associated structures were visualized under ultrasound guidance. After negative aspiration, local anesthetic was slowly injected around the nerve. There was no evidence of high pressure during the procedure. There were no paresthesias. VSS remained stable and the patient tolerated the procedure well.  10 mL 0.25% subcutaneous axillary ring for intercostobrachial nerve.

## 2022-01-27 NOTE — Discharge Instructions (Signed)
[facility]  Discharge Instructions  AV Fistula or Graft Surgery for Dialysis Access  Please refer to the following instructions for your post-procedure care. Your surgeon or physician assistant will discuss any changes with you.  Activity  You may drive the day following your surgery, if you are comfortable and no longer taking prescription pain medication. Resume full activity as the soreness in your incision resolves.  Bathing/Showering  You may shower after you go home. Keep your incision dry for 48 hours. Do not soak in a bathtub, hot tub, or swim until the incision heals completely. You may not shower if you have a hemodialysis catheter.  Incision Care  Clean your incision with mild soap and water after 48 hours. Pat the area dry with a clean towel. You do not need a bandage unless otherwise instructed. Do not apply any ointments or creams to your incision. You may have skin glue on your incision. Do not peel it off. It will come off on its own in about one week. Your arm may swell a bit after surgery. To reduce swelling use pillows to elevate your arm so it is above your heart. Your doctor will tell you if you need to lightly wrap your arm with an ACE bandage.  Diet  Resume your normal diet. There are not special food restrictions following this procedure. In order to heal from your surgery, it is CRITICAL to get adequate nutrition. Your body requires vitamins, minerals, and protein. Vegetables are the best source of vitamins and minerals. Vegetables also provide the perfect balance of protein. Processed food has little nutritional value, so try to avoid this.  Medications  Resume taking all of your medications. If your incision is causing pain, you may take over-the counter pain relievers such as acetaminophen (Tylenol). If you were prescribed a stronger pain medication, please be aware these medications can cause nausea and constipation. Prevent  nausea by taking the medication with a snack or meal. Avoid constipation by drinking plenty of fluids and eating foods with high amount of fiber, such as fruits, vegetables, and grains.  Do not take Tylenol if you are taking prescription pain medications.  Follow up Your surgeon may want to see you in the office following your access surgery. If so, this will be arranged at the time of your surgery.  Please call us immediately for any of the following conditions:  Increased pain, redness, drainage (pus) from your incision site Fever of 101 degrees or higher Severe or worsening pain at your incision site Hand pain or numbness.  Reduce your risk of vascular disease:  Stop smoking. If you would like help, call [facility] at 1-800-QUIT-NOW (573)027-3425) or [place] at [place] your cholesterol Maintain a desired weight Control your diabetes Keep your blood pressure down  Dialysis  It will take several weeks to several months for your new dialysis access to be ready for use. Your surgeon will determine when it is okay to use it. Your nephrologist will continue to direct your dialysis. You can continue to use your Permcath until your new access is ready for use.   01/27/2022 Samuel Burns [id number] [birth year]  Surgeon(s): [name], MD  Procedure(s): LEFT FIRST STAGE BASILIC VEIN TRANSPOSITION   May stick graft immediately   May stick graft on designated area only:   X Do not stick left AV fistula for 12 weeks    If you have any questions, please call the office at  [phone]. 

## 2022-01-27 NOTE — Progress Notes (Signed)
Orthopedic Tech Progress Note Patient Details:  [name] May 31, 1985 [id number]  PACU RN called requesting an arm sling. An arm sling was brought down and placed on the pt.    Ortho Devices Type of Ortho Device: Arm sling Ortho Device/Splint Location: RUE Ortho Device/Splint Interventions: Ordered, Application, Adjustment   Post Interventions Patient Tolerated: Well Instructions Provided: Adjustment of device, Care of device  Arville Go 01/27/2022, 10:10 AM

## 2022-01-27 NOTE — Anesthesia Procedure Notes (Signed)
Procedure Name: LMA Insertion Date/Time: 01/27/2022 8:08 AM  Performed by: Nilda Simmer, MDPre-anesthesia Checklist: Patient identified Patient Re-evaluated:Patient Re-evaluated prior to induction Oxygen Delivery Method: Circle system utilized Preoxygenation: Pre-oxygenation with 100% oxygen Induction Type: IV induction Ventilation: Mask ventilation without difficulty LMA: LMA inserted LMA Size: 5.0 Number of attempts: 1 Placement Confirmation: positive ETCO2 and breath sounds checked- equal and bilateral Tube secured with: Tape Dental Injury: Teeth and Oropharynx as per pre-operative assessment

## 2022-01-27 NOTE — Op Note (Signed)
    NAME: Samuel Burns    MRN: [id number] DOB: [birth year]    DATE OF OPERATION: 01/27/2022  PREOP DIAGNOSIS:    End stage renal disease  POSTOP DIAGNOSIS:    Same  PROCEDURE:    First stage left arm brachiobasilic fistula creation  SURGEON: [name]  ASSIST: Paulo Fruit, PA  ANESTHESIA: Moderate, Block   EBL: 98ml  INDICATIONS:    Samuel Burns is a 37 y.o. male with end stage renal disease. Previous left arm brachiocephalic fistula has occluded, leaving Samuel Burns in need of permanent HD access for dialysis. After discussing the risks and benefits of Left arm brachiobasilic fistula creation v AVG, Samuel Burns elected to proceed.   FINDINGS:   73mm brachial artery with what appeared to be a high bifurcation of the radial artery.  24mm basilic vein  TECHNIQUE:   A leftupper extremity block was performed by the anesthesia team.   The patient was brought to the operating room and placed in supine position. The left arm was prepped and draped in a standard fashion. IV antibiotics were prior to incision. A timeout was performed.   The basilic vein in the left arm was identified using ultrasound and appeared of sufficient size. A transverse incision was made above the elbow creese in the antecubital fossa. The basilic vein was identified and isolated for 4 cm in length.  The bicipital aponeurosis was partially released and the brachial artery freed from its paired brachial veins and secured with a vessel loop. The patient was heparinized. The basilic vein was marked and ligated distally with 2-0 silk, then flushed with heparinized saline. Vascular clamps were placed proximally and distally on the brachial artery and a 5 mm arteriotomy  was created on the brachial artery. This was flushed with heparin saline. The vein was juxtaposed to the artery and an anastomosis was created using 6-0 Prolene.   Prior to completing the anastomsis, the vessels were flushed and the suture line was  tied down. There was pulsatility in the basilic vein. Upon inspection there was a kink due to redundancy. The vessel was re-clamped, anastomosis taken down, and basilic vein shortened to remove some of the redundancy. This was flushed with heparin saline. The vein was juxtaposed to the artery and re-anastomosed using 6-0 Prolene.   On completion, there was a excellent thrill in the fistula.  The patient had a multiphasic radial pulse. The incision was irrigated and hemostasis acheived. The deeper tissue was closed with 3-0 Vicryl and the skin closed with 4-0 Monocryl.    Dermabond was applied the incisions. He was transferred to PACU in stable condition.     Macie Burows, MD [facility] DATE OF DICTATION:   01/27/2022

## 2022-01-27 NOTE — Transfer of Care (Signed)
Immediate Anesthesia Transfer of Care Note  Patient: Samuel Burns  Procedure(s) Performed: LEFT FIRST STAGE BASILIC VEIN TRANSPOSITION (Left: Arm Upper)  Patient Location: PACU  Anesthesia Type:General and Regional  Level of Consciousness: drowsy and patient cooperative  Airway & Oxygen Therapy: Patient Spontanous Breathing  Post-op Assessment: Report given to RN  Post vital signs: stable  Last Vitals:  Vitals Value Taken Time  BP 121/60 01/27/22 0936  Temp    Pulse 70 01/27/22 0937  Resp 14 01/27/22 0938  SpO2 99 % 01/27/22 0937  Vitals shown include unvalidated device data.  Last Pain:  Vitals:   01/27/22 0615  PainSc: 0-No pain         Complications: No notable events documented.

## 2022-01-27 NOTE — Anesthesia Postprocedure Evaluation (Signed)
Anesthesia Post Note  Patient: Hydrographic surveyor  Procedure(s) Performed: LEFT FIRST STAGE BASILIC VEIN TRANSPOSITION (Left: Arm Upper)     Patient location during evaluation: PACU Anesthesia Type: Regional Level of consciousness: awake Pain management: pain level controlled Vital Signs Assessment: post-procedure vital signs reviewed and stable Respiratory status: spontaneous breathing, nonlabored ventilation and respiratory function stable Cardiovascular status: blood pressure returned to baseline and stable Postop Assessment: no apparent nausea or vomiting Anesthetic complications: no   No notable events documented.  Last Vitals:  Vitals:   01/27/22 1000 01/27/22 1010  BP: (!) 103/90   Pulse: 68 64  Resp: 13 17  Temp: (!) 36.4 C   SpO2: 100% 93%    Last Pain:  Vitals:   01/27/22 1000  PainSc: 0-No pain                 Nilda Simmer

## 2022-01-28 ENCOUNTER — Encounter (HOSPITAL_COMMUNITY): Payer: Self-pay | Admitting: Vascular Surgery

## 2022-01-28 DIAGNOSIS — N2581 Secondary hyperparathyroidism of renal origin: Secondary | ICD-10-CM | POA: Diagnosis not present

## 2022-01-28 DIAGNOSIS — Z992 Dependence on renal dialysis: Secondary | ICD-10-CM | POA: Diagnosis not present

## 2022-01-28 DIAGNOSIS — D689 Coagulation defect, unspecified: Secondary | ICD-10-CM | POA: Diagnosis not present

## 2022-01-28 DIAGNOSIS — D631 Anemia in chronic kidney disease: Secondary | ICD-10-CM | POA: Diagnosis not present

## 2022-01-28 DIAGNOSIS — N186 End stage renal disease: Secondary | ICD-10-CM | POA: Diagnosis not present

## 2022-01-28 DIAGNOSIS — Z4800 Encounter for change or removal of nonsurgical wound dressing: Secondary | ICD-10-CM | POA: Diagnosis not present

## 2022-01-29 ENCOUNTER — Telehealth: Payer: Self-pay | Admitting: Physician Assistant

## 2022-01-29 NOTE — Telephone Encounter (Signed)
-----   Message from Karoline Caldwell, [place] sent at 01/27/2022  9:39 AM EST ----- S/p left AV fistula by Dr. Virl Cagey. Needs follow up with fistula duplex in 4-6 weeks

## 2022-01-30 DIAGNOSIS — Z992 Dependence on renal dialysis: Secondary | ICD-10-CM | POA: Diagnosis not present

## 2022-01-30 DIAGNOSIS — D689 Coagulation defect, unspecified: Secondary | ICD-10-CM | POA: Diagnosis not present

## 2022-01-30 DIAGNOSIS — D631 Anemia in chronic kidney disease: Secondary | ICD-10-CM | POA: Diagnosis not present

## 2022-01-30 DIAGNOSIS — N186 End stage renal disease: Secondary | ICD-10-CM | POA: Diagnosis not present

## 2022-01-30 DIAGNOSIS — N2581 Secondary hyperparathyroidism of renal origin: Secondary | ICD-10-CM | POA: Diagnosis not present

## 2022-01-30 DIAGNOSIS — Z4800 Encounter for change or removal of nonsurgical wound dressing: Secondary | ICD-10-CM | POA: Diagnosis not present

## 2022-02-02 DIAGNOSIS — N2581 Secondary hyperparathyroidism of renal origin: Secondary | ICD-10-CM | POA: Diagnosis not present

## 2022-02-02 DIAGNOSIS — Z4800 Encounter for change or removal of nonsurgical wound dressing: Secondary | ICD-10-CM | POA: Diagnosis not present

## 2022-02-02 DIAGNOSIS — Z992 Dependence on renal dialysis: Secondary | ICD-10-CM | POA: Diagnosis not present

## 2022-02-02 DIAGNOSIS — D631 Anemia in chronic kidney disease: Secondary | ICD-10-CM | POA: Diagnosis not present

## 2022-02-02 DIAGNOSIS — D689 Coagulation defect, unspecified: Secondary | ICD-10-CM | POA: Diagnosis not present

## 2022-02-02 DIAGNOSIS — N186 End stage renal disease: Secondary | ICD-10-CM | POA: Diagnosis not present

## 2022-02-04 DIAGNOSIS — Z992 Dependence on renal dialysis: Secondary | ICD-10-CM | POA: Diagnosis not present

## 2022-02-04 DIAGNOSIS — D689 Coagulation defect, unspecified: Secondary | ICD-10-CM | POA: Diagnosis not present

## 2022-02-04 DIAGNOSIS — N2581 Secondary hyperparathyroidism of renal origin: Secondary | ICD-10-CM | POA: Diagnosis not present

## 2022-02-04 DIAGNOSIS — N186 End stage renal disease: Secondary | ICD-10-CM | POA: Diagnosis not present

## 2022-02-04 DIAGNOSIS — Z4800 Encounter for change or removal of nonsurgical wound dressing: Secondary | ICD-10-CM | POA: Diagnosis not present

## 2022-02-04 DIAGNOSIS — D631 Anemia in chronic kidney disease: Secondary | ICD-10-CM | POA: Diagnosis not present

## 2022-02-06 ENCOUNTER — Emergency Department (HOSPITAL_COMMUNITY)
Admission: EM | Admit: 2022-02-06 | Discharge: 2022-02-06 | Disposition: A | Discharge status: Home / Self Care | Payer: Medicare Other | Attending: Emergency Medicine | Admitting: Emergency Medicine

## 2022-02-06 ENCOUNTER — Other Ambulatory Visit: Payer: Self-pay

## 2022-02-06 ENCOUNTER — Emergency Department (HOSPITAL_COMMUNITY): Payer: Medicare Other

## 2022-02-06 ENCOUNTER — Encounter (HOSPITAL_COMMUNITY): Payer: Self-pay

## 2022-02-06 DIAGNOSIS — I120 Hypertensive chronic kidney disease with stage 5 chronic kidney disease or end stage renal disease: Secondary | ICD-10-CM | POA: Insufficient documentation

## 2022-02-06 DIAGNOSIS — Z992 Dependence on renal dialysis: Secondary | ICD-10-CM | POA: Diagnosis not present

## 2022-02-06 DIAGNOSIS — N186 End stage renal disease: Secondary | ICD-10-CM | POA: Insufficient documentation

## 2022-02-06 DIAGNOSIS — Z79899 Other long term (current) drug therapy: Secondary | ICD-10-CM | POA: Insufficient documentation

## 2022-02-06 DIAGNOSIS — J45909 Unspecified asthma, uncomplicated: Secondary | ICD-10-CM | POA: Diagnosis not present

## 2022-02-06 DIAGNOSIS — Z7951 Long term (current) use of inhaled steroids: Secondary | ICD-10-CM | POA: Insufficient documentation

## 2022-02-06 DIAGNOSIS — R0600 Dyspnea, unspecified: Secondary | ICD-10-CM

## 2022-02-06 DIAGNOSIS — Z743 Need for continuous supervision: Secondary | ICD-10-CM | POA: Diagnosis not present

## 2022-02-06 DIAGNOSIS — Z139 Encounter for screening, unspecified: Secondary | ICD-10-CM | POA: Diagnosis not present

## 2022-02-06 DIAGNOSIS — R0602 Shortness of breath: Secondary | ICD-10-CM | POA: Insufficient documentation

## 2022-02-06 LAB — BASIC METABOLIC PANEL
Anion gap: 15 (ref 5–15)
BUN: 42 mg/dL — ABNORMAL HIGH (ref 6–20)
CO2: 30 mmol/L (ref 22–32)
Calcium: 9.5 mg/dL (ref 8.9–10.3)
Chloride: 93 mmol/L — ABNORMAL LOW (ref 98–111)
Creatinine, Ser: 12.49 mg/dL — ABNORMAL HIGH (ref 0.61–1.24)
GFR, Estimated: 5 mL/min — ABNORMAL LOW (ref >60)
Glucose, Bld: 103 mg/dL — ABNORMAL HIGH (ref 70–99)
Potassium: 4.4 mmol/L (ref 3.5–5.1)
Sodium: 138 mmol/L (ref 135–145)

## 2022-02-06 LAB — CBC WITH DIFFERENTIAL/PLATELET
Abs Immature Granulocytes: 0.02 10*3/uL (ref 0.00–0.07)
Basophils Absolute: 0.1 10*3/uL (ref 0.0–0.1)
Basophils Relative: 1 %
Eosinophils Absolute: 0.3 10*3/uL (ref 0.0–0.5)
Eosinophils Relative: 4 %
HCT: 37.5 % — ABNORMAL LOW (ref 39.0–52.0)
Hemoglobin: 11.4 g/dL — ABNORMAL LOW (ref 13.0–17.0)
Immature Granulocytes: 0 %
Lymphocytes Relative: 33 %
Lymphs Abs: 2.4 10*3/uL (ref 0.7–4.0)
MCH: 29.8 pg (ref 26.0–34.0)
MCHC: 30.4 g/dL (ref 30.0–36.0)
MCV: 97.9 fL (ref 80.0–100.0)
Monocytes Absolute: 0.7 10*3/uL (ref 0.1–1.0)
Monocytes Relative: 10 %
Neutro Abs: 3.7 10*3/uL (ref 1.7–7.7)
Neutrophils Relative %: 52 %
Platelets: 215 10*3/uL (ref 150–400)
RBC: 3.83 MIL/uL — ABNORMAL LOW (ref 4.22–5.81)
RDW: 15.5 % (ref 11.5–15.5)
WBC: 7.2 10*3/uL (ref 4.0–10.5)
nRBC: 0.3 % — ABNORMAL HIGH (ref 0.0–0.2)

## 2022-02-06 LAB — TROPONIN I (HIGH SENSITIVITY)
Troponin I (High Sensitivity): 41 ng/L — ABNORMAL HIGH (ref <18)
Troponin I (High Sensitivity): 37 ng/L — ABNORMAL HIGH (ref <18)

## 2022-02-06 LAB — BRAIN NATRIURETIC PEPTIDE: B Natriuretic Peptide: 8 pg/mL (ref 0.0–100.0)

## 2022-02-06 NOTE — ED Triage Notes (Signed)
Pt to ED via [facility] from home. Pt states he feels like fluid is building up, has not had swelling.  Pt has dialysis MWF, has not missed any appointments.   EMS VSS

## 2022-02-06 NOTE — Discharge Instructions (Signed)
Thank you for coming to [facility] Emergency Department. You were seen for sensation of fluid overload. We did an exam, labs, and imaging, and these showed no acute findings. Your chest xray and labs were reassuring. Your physical exam did not demonstrate any significant fluid overload. Please call your dialysis center to have dialysis either today or tomorrow.  Please follow up with your primary care provider within 2 weeks.   Do not hesitate to return to the ED or call 911 if you experience: -Worsening symptoms -Chest pain, shortness of breath -Lightheadedness, passing out -Fevers/chills -Anything else that concerns you

## 2022-02-06 NOTE — ED Triage Notes (Signed)
Pt states he feels like his body is filling up with fluid and he feels shaky, states he has never felt like this before. Pt denies pain, nausea, vomiting. Pt has shortness of breath.

## 2022-02-06 NOTE — ED Provider Triage Note (Signed)
  Emergency Medicine Provider Triage Evaluation Note  MRN:  [id number]  Arrival date & time: 02/06/22    Medically screening exam initiated at 1:55 AM.   CC:   Shortness of Breath   HPI:  Samuel Burns is a 36 y.o. year-old male presents to the ED with chief complaint of feeling like he has too much fluid on his system.  Onset yesterday.    On dialysis MWF.  Last dialyzed on Wednesday.  History provided by patient. ROS:  -As included in HPI PE:   Vitals:   02/06/22 0144  BP: 101/83  Pulse: 86  Resp: 18  Temp: 98.1 F (36.7 C)  SpO2: 100%    Non-toxic appearing No respiratory distress Catheter in left chest wall MDM:  Based on signs and symptoms, volume overload is highest on my differential. I've ordered labs and imaging in triage to expedite lab/diagnostic workup.  Patient was informed that the remainder of the evaluation will be completed by another provider, this initial triage assessment does not replace that evaluation, and the importance of remaining in the ED until their evaluation is complete.    Montine Circle, PA-C 02/06/22 [phone]

## 2022-02-06 NOTE — ED Provider Notes (Signed)
[facility] EMERGENCY DEPARTMENT Provider Note   CSN: [id number] Arrival date & time: 02/06/22  0137     History {Add pertinent medical, surgical, social history, OB history to HPI:1} Chief Complaint  Patient presents with   Shortness of Breath    Samuel Burns is a 36 y.o. male with ESRD on HD MWF, HTN, ACD, pericardial effusion, MDD who presents with SOB.  Patient   Last dialysis was Wednesday, got a full session, hasn't missed any sessions.   Shortness of Breath      Home Medications Prior to Admission medications   Medication Sig Start Date End Date Taking? Authorizing Provider  acetaminophen (TYLENOL) 500 MG tablet Take 1,000 mg by mouth every 6 (six) hours as needed for moderate pain.    [provider]  amLODipine (NORVASC) 10 MG tablet Take 1 tablet (10 mg total) by mouth at bedtime. 12/09/21   Gildardo Pounds, NP  buPROPion (WELLBUTRIN XL) 150 MG 24 hr tablet Take 150 mg by mouth in the morning. 09/26/21   [provider]  cloNIDine (CATAPRES) 0.1 MG tablet Take 0.1 mg by mouth 2 (two) times daily.    [provider]  fluticasone (FLONASE) 50 MCG/ACT nasal spray Place 2 sprays into both nostrils daily. Patient taking differently: Place 2 sprays into both nostrils in the morning. 05/13/21   Brunetta Jeans, PA-C  hydrOXYzine (VISTARIL) 25 MG capsule Take 25 mg by mouth at bedtime.    [provider]  ibuprofen (ADVIL) 200 MG tablet Take 200-400 mg by mouth daily as needed for headache or mild pain.    [provider]  lidocaine-prilocaine (EMLA) cream Apply 1 application  topically daily as needed (access fistula).    [provider]  losartan (COZAAR) 50 MG tablet Take 50 mg by mouth in the morning.    [provider]  MELATONIN PO Take 1-2 tablets by mouth at bedtime as needed (sleep).    [provider]  Metoprolol Tartrate 75 MG TABS TAKE 1 TABLET BY MOUTH TWICE DAILY.  04/22/21 04/22/22  Ladell Pier, MD  Multiple Vitamin (MULTIVITAMIN WITH MINERALS) TABS tablet Take 1 tablet by mouth in the morning.    [provider]  Naphazoline HCl (CLEAR EYES OP) Place 1 drop into both eyes daily as needed (irritation).    [provider]  oxyCODONE-acetaminophen (PERCOCET/ROXICET) 5-325 MG tablet Take 1 tablet by mouth every 6 (six) hours as needed. 01/27/22   Baglia, Corrina, PA-C  sertraline (ZOLOFT) 100 MG tablet Take 100 mg by mouth in the morning. 04/15/21   [provider]  sevelamer carbonate (RENVELA) 800 MG tablet Take 1 tablet (800 mg total) by mouth 3 (three) times daily with meals. 12/15/19   Ladell Pier, MD  sildenafil (VIAGRA) 50 MG tablet 1 tab PO 1/2 hr prior to sex PRN.  Limit use to 1 tab/24 hr 07/30/20   Ladell Pier, MD  sucroferric oxyhydroxide (VELPHORO) 500 MG chewable tablet Chew 500-1,000 mg by mouth See admin instructions. 1000 mg with 3 times daily meals, 500 mg with snacks 10/22/21   [provider]      Allergies    Patient has no known allergies.    Review of Systems   Review of Systems  Respiratory:  Positive for shortness of breath.    Review of systems {pos/neg:18640::"Negative","Positive"} for ***.  A 10 point review of systems was performed and is negative unless otherwise reported in HPI.  Physical  Exam Updated Vital Signs BP 95/66   Pulse 64   Temp (!) 97.4 F (36.3 C) (Oral)   Resp 17   Ht 5\' 8"  (1.727 m)   Wt 83.9 kg   SpO2 100%   BMI 28.13 kg/m  Physical Exam General: Normal appearing {Desc; male/male:11659}, lying in bed.  HEENT: PERRLA, Sclera anicteric, MMM, trachea midline.  Cardiology: RRR, no murmurs/rubs/gallops. BL radial and DP pulses equal bilaterally.  Resp: Normal respiratory rate and effort. CTAB, no wheezes, rhonchi, crackles.  Abd: Soft, non-tender, non-distended. No rebound tenderness or guarding.  GU: Deferred. MSK: No peripheral edema or signs of  trauma. Extremities without deformity or TTP. No cyanosis or clubbing. Skin: warm, dry. No rashes or lesions. Back: No CVA tenderness Neuro: A&Ox4, CNs II-XII grossly intact. MAEs. Sensation grossly intact.  Psych: Normal mood and affect.   ED Results / Procedures / Treatments   Labs (all labs ordered are listed, but only abnormal results are displayed) Labs Reviewed  CBC WITH DIFFERENTIAL/PLATELET - Abnormal; Notable for the following components:      Result Value   RBC 3.83 (*)    Hemoglobin 11.4 (*)    HCT 37.5 (*)    nRBC 0.3 (*)    All other components within normal limits  BASIC METABOLIC PANEL - Abnormal; Notable for the following components:   Chloride 93 (*)    Glucose, Bld 103 (*)    BUN 42 (*)    Creatinine, Ser 12.49 (*)    GFR, Estimated 5 (*)    All other components within normal limits  TROPONIN I (HIGH SENSITIVITY) - Abnormal; Notable for the following components:   Troponin I (High Sensitivity) 41 (*)    All other components within normal limits  TROPONIN I (HIGH SENSITIVITY) - Abnormal; Notable for the following components:   Troponin I (High Sensitivity) 37 (*)    All other components within normal limits  BRAIN NATRIURETIC PEPTIDE    EKG EKG Interpretation  Date/Time:  Friday February 06 2022 01:38:02 EST Ventricular Rate:  87 PR Interval:  158 QRS Duration: 104 QT Interval:  378 QTC Calculation: 454 R Axis:   12 Text Interpretation: Normal sinus rhythm Minimal voltage criteria for LVH, may be normal variant ( R in aVL ) Nonspecific T wave abnormality Abnormal ECG When compared with ECG of 22-Dec-2021 09:19, Nonspecific T wave abnormality is now present Confirmed by Delora Fuel ([id number]) on 02/06/2022 5:21:24 AM  Radiology DG Chest 2 View  Result Date: 02/06/2022 CLINICAL DATA:  Screening EXAM: CHEST - 2 VIEW COMPARISON:  CT chest dated 10/23/2021 FINDINGS: Lungs are clear.  No pleural effusion or pneumothorax. The heart is normal in size. Left dual  lumen dialysis catheter terminates cavoatrial junction. Visualized osseous structures are within normal limits. IMPRESSION: Normal chest radiographs. Electronically Signed   By: Julian Hy M.D.   On: 02/06/2022 02:23    Procedures Procedures  {Document cardiac monitor, telemetry assessment procedure when appropriate:1}  Medications Ordered in ED Medications - No data to display  ED Course/ Medical Decision Making/ A&P                          Medical Decision Making   @HNNMDM @ *** This patient presents to the ED for concern of ***, this involves an extensive number of treatment options, and is a complaint that carries with it a high risk of complications and morbidity.  I considered the following differential and admission for  this acute, potentially life threatening condition.  The differential diagnosis includes ***  MDM:    ***  (Labs, imaging, consults)  Labs: I Ordered, and personally interpreted labs.  The pertinent results include:  ***  Imaging Studies ordered: I ordered imaging studies including *** I independently visualized and interpreted imaging. I agree with the radiologist interpretation  Additional history obtained from ***.  External records from outside source obtained and reviewed including ***  Cardiac Monitoring: The patient was maintained on a cardiac monitor.  I personally viewed and interpreted the cardiac monitored which showed an underlying rhythm of: ***  Reevaluation: After the interventions noted above, I reevaluated the patient and found that they have :{resolved/improved/worsened:23923::"improved"}  Social Determinants of Health: ***  Disposition:  ***  Co morbidities that complicate the patient evaluation  Past Medical History:  Diagnosis Date   ADHD (attention deficit hyperactivity disorder)    ADHD   Anemia    Anxiety    Asthma    as a child   Depression    Hypertension    Renal failure    Dialysis M/W/F      Medicines No orders of the defined types were placed in this encounter.   I have reviewed the patients home medicines and have made adjustments as needed  Problem List / ED Course: Problem List Items Addressed This Visit   None        Clinical Course as of 02/06/22 1212  Fri Feb 06, 2022  1150 B Natriuretic Peptide: 8.0 [HN]  1150 WBC: 7.2 [HN]  1150 Hemoglobin(!): 11.4 Greater than baseline [HN]  1150 Troponin I (High Sensitivity)(!): 37 [HN]  1150 Creatinine(!): 12.49 C/w prior values [HN]  1150 BUN(!): 42 [HN]  1150 DG Chest 2 View FINDINGS: Lungs are clear.  No pleural effusion or pneumothorax.  The heart is normal in size.  Left dual lumen dialysis catheter terminates cavoatrial junction.  Visualized osseous structures are within normal limits.  IMPRESSION: Normal chest radiographs.   [HN]  1151 Troponin I (High Sensitivity)(!): 37 41-37, flat [HN]    Clinical Course User Index [HN] Audley Hose, MD    {Document critical care time when appropriate:1} {Document review of labs and clinical decision tools ie heart score, Chads2Vasc2 etc:1}  {Document your independent review of radiology images, and any outside records:1} {Document your discussion with family members, caretakers, and with consultants:1} {Document social determinants of health affecting pt's care:1} {Document your decision making why or why not admission, treatments were needed:1}  This note was created using dictation software, which may contain spelling or grammatical errors.

## 2022-02-08 DIAGNOSIS — D631 Anemia in chronic kidney disease: Secondary | ICD-10-CM | POA: Diagnosis not present

## 2022-02-08 DIAGNOSIS — N2581 Secondary hyperparathyroidism of renal origin: Secondary | ICD-10-CM | POA: Diagnosis not present

## 2022-02-08 DIAGNOSIS — N186 End stage renal disease: Secondary | ICD-10-CM | POA: Diagnosis not present

## 2022-02-08 DIAGNOSIS — D689 Coagulation defect, unspecified: Secondary | ICD-10-CM | POA: Diagnosis not present

## 2022-02-08 DIAGNOSIS — Z992 Dependence on renal dialysis: Secondary | ICD-10-CM | POA: Diagnosis not present

## 2022-02-08 DIAGNOSIS — Z4800 Encounter for change or removal of nonsurgical wound dressing: Secondary | ICD-10-CM | POA: Diagnosis not present

## 2022-02-11 DIAGNOSIS — D631 Anemia in chronic kidney disease: Secondary | ICD-10-CM | POA: Diagnosis not present

## 2022-02-11 DIAGNOSIS — D689 Coagulation defect, unspecified: Secondary | ICD-10-CM | POA: Diagnosis not present

## 2022-02-11 DIAGNOSIS — Z4800 Encounter for change or removal of nonsurgical wound dressing: Secondary | ICD-10-CM | POA: Diagnosis not present

## 2022-02-11 DIAGNOSIS — N186 End stage renal disease: Secondary | ICD-10-CM | POA: Diagnosis not present

## 2022-02-11 DIAGNOSIS — Z992 Dependence on renal dialysis: Secondary | ICD-10-CM | POA: Diagnosis not present

## 2022-02-11 DIAGNOSIS — N2581 Secondary hyperparathyroidism of renal origin: Secondary | ICD-10-CM | POA: Diagnosis not present

## 2022-02-13 DIAGNOSIS — N186 End stage renal disease: Secondary | ICD-10-CM | POA: Diagnosis not present

## 2022-02-13 DIAGNOSIS — N2581 Secondary hyperparathyroidism of renal origin: Secondary | ICD-10-CM | POA: Diagnosis not present

## 2022-02-13 DIAGNOSIS — D689 Coagulation defect, unspecified: Secondary | ICD-10-CM | POA: Diagnosis not present

## 2022-02-13 DIAGNOSIS — D631 Anemia in chronic kidney disease: Secondary | ICD-10-CM | POA: Diagnosis not present

## 2022-02-13 DIAGNOSIS — Z992 Dependence on renal dialysis: Secondary | ICD-10-CM | POA: Diagnosis not present

## 2022-02-13 DIAGNOSIS — Z4800 Encounter for change or removal of nonsurgical wound dressing: Secondary | ICD-10-CM | POA: Diagnosis not present

## 2022-02-15 DIAGNOSIS — Z992 Dependence on renal dialysis: Secondary | ICD-10-CM | POA: Diagnosis not present

## 2022-02-15 DIAGNOSIS — Z4800 Encounter for change or removal of nonsurgical wound dressing: Secondary | ICD-10-CM | POA: Diagnosis not present

## 2022-02-15 DIAGNOSIS — I129 Hypertensive chronic kidney disease with stage 1 through stage 4 chronic kidney disease, or unspecified chronic kidney disease: Secondary | ICD-10-CM | POA: Diagnosis not present

## 2022-02-15 DIAGNOSIS — N186 End stage renal disease: Secondary | ICD-10-CM | POA: Diagnosis not present

## 2022-02-15 DIAGNOSIS — N2581 Secondary hyperparathyroidism of renal origin: Secondary | ICD-10-CM | POA: Diagnosis not present

## 2022-02-15 DIAGNOSIS — D631 Anemia in chronic kidney disease: Secondary | ICD-10-CM | POA: Diagnosis not present

## 2022-02-15 DIAGNOSIS — D689 Coagulation defect, unspecified: Secondary | ICD-10-CM | POA: Diagnosis not present

## 2022-02-18 DIAGNOSIS — N186 End stage renal disease: Secondary | ICD-10-CM | POA: Diagnosis not present

## 2022-02-18 DIAGNOSIS — N2581 Secondary hyperparathyroidism of renal origin: Secondary | ICD-10-CM | POA: Diagnosis not present

## 2022-02-18 DIAGNOSIS — Z992 Dependence on renal dialysis: Secondary | ICD-10-CM | POA: Diagnosis not present

## 2022-02-18 DIAGNOSIS — Z4800 Encounter for change or removal of nonsurgical wound dressing: Secondary | ICD-10-CM | POA: Diagnosis not present

## 2022-02-18 DIAGNOSIS — D689 Coagulation defect, unspecified: Secondary | ICD-10-CM | POA: Diagnosis not present

## 2022-02-20 DIAGNOSIS — Z992 Dependence on renal dialysis: Secondary | ICD-10-CM | POA: Diagnosis not present

## 2022-02-20 DIAGNOSIS — N186 End stage renal disease: Secondary | ICD-10-CM | POA: Diagnosis not present

## 2022-02-20 DIAGNOSIS — N2581 Secondary hyperparathyroidism of renal origin: Secondary | ICD-10-CM | POA: Diagnosis not present

## 2022-02-20 DIAGNOSIS — D689 Coagulation defect, unspecified: Secondary | ICD-10-CM | POA: Diagnosis not present

## 2022-02-20 DIAGNOSIS — Z4800 Encounter for change or removal of nonsurgical wound dressing: Secondary | ICD-10-CM | POA: Diagnosis not present

## 2022-02-23 DIAGNOSIS — D689 Coagulation defect, unspecified: Secondary | ICD-10-CM | POA: Diagnosis not present

## 2022-02-23 DIAGNOSIS — N2581 Secondary hyperparathyroidism of renal origin: Secondary | ICD-10-CM | POA: Diagnosis not present

## 2022-02-23 DIAGNOSIS — Z4800 Encounter for change or removal of nonsurgical wound dressing: Secondary | ICD-10-CM | POA: Diagnosis not present

## 2022-02-23 DIAGNOSIS — N186 End stage renal disease: Secondary | ICD-10-CM | POA: Diagnosis not present

## 2022-02-23 DIAGNOSIS — Z992 Dependence on renal dialysis: Secondary | ICD-10-CM | POA: Diagnosis not present

## 2022-02-25 DIAGNOSIS — N2581 Secondary hyperparathyroidism of renal origin: Secondary | ICD-10-CM | POA: Diagnosis not present

## 2022-02-25 DIAGNOSIS — Z4800 Encounter for change or removal of nonsurgical wound dressing: Secondary | ICD-10-CM | POA: Diagnosis not present

## 2022-02-25 DIAGNOSIS — Z992 Dependence on renal dialysis: Secondary | ICD-10-CM | POA: Diagnosis not present

## 2022-02-25 DIAGNOSIS — D689 Coagulation defect, unspecified: Secondary | ICD-10-CM | POA: Diagnosis not present

## 2022-02-25 DIAGNOSIS — N186 End stage renal disease: Secondary | ICD-10-CM | POA: Diagnosis not present

## 2022-02-25 NOTE — Telephone Encounter (Signed)
Appointment scheduled.

## 2022-02-26 ENCOUNTER — Ambulatory Visit (HOSPITAL_COMMUNITY)
Admission: RE | Admit: 2022-02-26 | Discharge: 2022-02-26 | Disposition: A | Discharge status: Home / Self Care | Payer: 59 | Source: Ambulatory Visit | Attending: Vascular Surgery | Admitting: Vascular Surgery

## 2022-02-26 ENCOUNTER — Other Ambulatory Visit: Payer: Self-pay | Admitting: *Deleted

## 2022-02-26 ENCOUNTER — Encounter: Payer: Self-pay | Admitting: Critical Care Medicine

## 2022-02-26 ENCOUNTER — Ambulatory Visit: Payer: 59 | Attending: Critical Care Medicine | Admitting: Critical Care Medicine

## 2022-02-26 VITALS — BP 137/86 | HR 98 | Ht 67.0 in | Wt 181.6 lb

## 2022-02-26 DIAGNOSIS — R202 Paresthesia of skin: Secondary | ICD-10-CM | POA: Diagnosis not present

## 2022-02-26 DIAGNOSIS — N186 End stage renal disease: Secondary | ICD-10-CM

## 2022-02-26 DIAGNOSIS — Z992 Dependence on renal dialysis: Secondary | ICD-10-CM

## 2022-02-26 DIAGNOSIS — F330 Major depressive disorder, recurrent, mild: Secondary | ICD-10-CM

## 2022-02-26 DIAGNOSIS — R200 Anesthesia of skin: Secondary | ICD-10-CM | POA: Diagnosis not present

## 2022-02-26 DIAGNOSIS — F909 Attention-deficit hyperactivity disorder, unspecified type: Secondary | ICD-10-CM | POA: Diagnosis not present

## 2022-02-26 DIAGNOSIS — M311 Thrombotic microangiopathy, unspecified: Secondary | ICD-10-CM

## 2022-02-26 NOTE — Patient Instructions (Signed)
Renal transplant clinic referral made  Referral to psychiatry will be made  Discussed the numbness in your fingers with vascular surgery when you see them today  Return to see Dr. Wynetta Emery for primary care follow-up 4 months

## 2022-02-26 NOTE — Progress Notes (Signed)
Established Patient Office Visit  Subjective   Patient ID: Samuel Burns, male    DOB: Nov 03, 1985  Age: 37 y.o. MRN: [id number]  Chief Complaint  Patient presents with   Numbness    Mainly in left hand on middle index and thumb    37 y.o.M PCP Samuel Burns This patient has end-stage renal disease and is seen today for numbness in the left hand Patient is seen today for assessment of numbness in the left hand.  He is end-stage renal disease gets dialyzed Monday Wednesday and Friday and has had difficulty with access.  He has an HD catheter in place in the right neck and a failed AV fistula in the right upper extremity had a recent left upper extremity AV fistula placed in December.  He is seeing vascular today to assess.  He had numbness in the entire hand now is left with numbness in the thumb and index and middle finger.  His strength in that hand is slightly weak.  He is having no pain with this.  He was seeing transplant physician but has not seen that clinic in a while he would like to go back for reassessment for kidney transplant.  Records from [facility] are seen including multiple recent labs within range and do not need to be repeated.  Below is the assessment in December from the work on his AV fistula with vascular surgery Adm 01/2022 for AV fistula work  HPI:  This is a 37 y.o. male who is s/p left BC AVF on 12/22/2021 by Dr. Carlis Abbott.  He had previously undergone right BC AVF in October 2021 also by Dr. Carlis Abbott.     He is currently dialyzing via [place] placed by IR on 11/21/2021.   Pt is right handed.     Pt states he does not have pain/numbness in the left hand.  He states that shortly after surgery, he was not able to feel the fistula.  He states that his uncontrolled high blood pressure is why he has kidney failure and is in process of working on transplant list.    The pt is on dialysis M/W/F at [place] location.     Assessment/Plan:  This is a 37 y.o. male who is  s/p:  left BC AVF on 12/22/2021 by Dr. Carlis Abbott.   -the pt does not have evidence of steal. -unfortunately, his fistula is clotted and has probably been down since shortly after surgery.  Reviewed pt's dialysis duplex and previous vein mapping today with Dr. Carlis Abbott and will proceed with left 1st stage BVT.  I discussed with pt that this will be staged in 2 procedures if the fistula matures.  He also understands that if the vein is not adequate in the OR, a graft may need to be placed.  -questions answered and pt wants to proceed.   Patient went to the ER December 22 thinking was fluid overloaded they assessed him and he was not below is that documentation ED 02/06/22 Patient is overall very well appearing, in no respiratory distress. He has no crackles on auscultation and no appreciable edema on physical exam. Consider fluid overload though patient does not demonstrate external signs of this. Possible that his fluid has indeed increased such that the patient can perceive it. Will evaluate for pulmonary edema, pleural effusion, or heart failure. Also consider ACS/arrhythmia and will eval w/ EKG/trop. No LE edema or erythema, no c/f DVT/PE. Consider electrolyte abnormalities, though patient has been compliant w/ dialysis. Labs  were ordered from triage and demonstrate no acute changes, flat troponin.         Review of Systems  Constitutional:  Negative for chills, diaphoresis, fever, malaise/fatigue and weight loss.  HENT:  Negative for congestion, hearing loss, nosebleeds, sore throat and tinnitus.   Eyes:  Negative for blurred vision, photophobia and redness.  Respiratory:  Negative for cough, hemoptysis, sputum production, shortness of breath, wheezing and stridor.   Cardiovascular:  Negative for chest pain, palpitations, orthopnea, claudication, leg swelling and PND.  Gastrointestinal:  Negative for abdominal pain, blood in stool, constipation, diarrhea, heartburn, nausea and vomiting.   Genitourinary:  Negative for dysuria, flank pain, frequency, hematuria and urgency.  Musculoskeletal:  Negative for back pain, falls, joint pain, myalgias and neck pain.  Skin:  Negative for itching and rash.  Neurological:  Positive for tingling. Negative for dizziness, tremors, sensory change, speech change, focal weakness, seizures, loss of consciousness, weakness and headaches.  Endo/Heme/Allergies:  Negative for environmental allergies and polydipsia. Does not bruise/bleed easily.  Psychiatric/Behavioral:  Negative for depression, memory loss, substance abuse and suicidal ideas. The patient is not nervous/anxious and does not have insomnia.       Objective:     BP 137/86 (BP Location: Right Arm, Patient Position: Sitting, Cuff Size: Normal)   Pulse 98   Ht 5\' 7"  (1.702 m)   Wt 181 lb 9.6 oz (82.4 kg)   SpO2 98%   BMI 28.44 kg/m    Physical Exam Vitals reviewed.  Constitutional:      Appearance: Normal appearance. He is well-developed. He is not diaphoretic.  HENT:     Head: Normocephalic and atraumatic.     Nose: No nasal deformity, septal deviation, mucosal edema or rhinorrhea.     Right Sinus: No maxillary sinus tenderness or frontal sinus tenderness.     Left Sinus: No maxillary sinus tenderness or frontal sinus tenderness.     Mouth/Throat:     Pharynx: No oropharyngeal exudate.  Eyes:     General: No scleral icterus.    Conjunctiva/sclera: Conjunctivae normal.     Pupils: Pupils are equal, round, and reactive to light.  Neck:     Thyroid: No thyromegaly.     Vascular: No carotid bruit or JVD.     Trachea: Trachea normal. No tracheal tenderness or tracheal deviation.  Cardiovascular:     Rate and Rhythm: Normal rate and regular rhythm.     Chest Wall: PMI is not displaced.     Pulses: Normal pulses. No decreased pulses.     Heart sounds: Normal heart sounds, S1 normal and S2 normal. Heart sounds not distant. No murmur heard.    No systolic murmur is present.      No diastolic murmur is present.     No friction rub. No gallop. No S3 or S4 sounds.  Pulmonary:     Effort: No tachypnea, accessory muscle usage or respiratory distress.     Breath sounds: No stridor. No decreased breath sounds, wheezing, rhonchi or rales.  Chest:     Chest wall: No tenderness.  Abdominal:     General: Bowel sounds are normal. There is no distension.     Palpations: Abdomen is soft. Abdomen is not rigid.     Tenderness: There is no abdominal tenderness. There is no guarding or rebound.  Musculoskeletal:        General: Normal range of motion.     Cervical back: Normal range of motion and neck supple. No edema,  erythema or rigidity. No muscular tenderness. Normal range of motion.     Comments: Old AV fistula right upper arm does not appear to be working I cannot assess whether the left AV fistula is working I do not hear a thrill nor do I feel pulsation  Decreased sensation to the left hand thumb index and middle finger  Lymphadenopathy:     Head:     Right side of head: No submental or submandibular adenopathy.     Left side of head: No submental or submandibular adenopathy.     Cervical: No cervical adenopathy.  Skin:    General: Skin is warm and dry.     Coloration: Skin is not pale.     Findings: No rash.     Nails: There is no clubbing.  Neurological:     Mental Status: He is alert and oriented to person, place, and time.     Sensory: Sensory deficit present.  Psychiatric:        Speech: Speech normal.        Behavior: Behavior normal.      No results found for any visits on 02/26/22.    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  He has a visit later today with vascular surgery and I asked him to follow-up with them on this issue.  At this point there is nothing I can offer but observation      Other Visit Diagnoses     ESRD on dialysis [facility])   (Chronic)  -  Primary   Relevant Orders   Ambulatory referral to Nephrology   Major depressive disorder, recurrent episode, mild ([place])   (Chronic)     Attention deficit hyperactivity disorder (ADHD), unspecified ADHD type       Relevant Orders   Ambulatory referral to Psychiatry       Return in about 4 months (around 06/27/2022).    Asencion Noble, MD

## 2022-02-26 NOTE — Assessment & Plan Note (Signed)
I suspect this is related by his recent difficulties with the left AV fistula.  He has a visit later today with vascular surgery and I asked him to follow-up with them on this issue.  At this point there is nothing I can offer but observation

## 2022-02-26 NOTE — Assessment & Plan Note (Signed)
The patient is interested in a transplant evaluation he was with [facility] transplant clinic and has been lost to follow-up I did tell him I would send another referral over there for him to at least get reestablished

## 2022-02-27 ENCOUNTER — Ambulatory Visit (INDEPENDENT_AMBULATORY_CARE_PROVIDER_SITE_OTHER): Payer: 59 | Admitting: Physician Assistant

## 2022-02-27 ENCOUNTER — Other Ambulatory Visit: Payer: Self-pay

## 2022-02-27 VITALS — BP 123/90 | HR 76 | Temp 98.1°F | Resp 20 | Ht 67.0 in | Wt 180.1 lb

## 2022-02-27 DIAGNOSIS — Z992 Dependence on renal dialysis: Secondary | ICD-10-CM

## 2022-02-27 DIAGNOSIS — D689 Coagulation defect, unspecified: Secondary | ICD-10-CM | POA: Diagnosis not present

## 2022-02-27 DIAGNOSIS — N186 End stage renal disease: Secondary | ICD-10-CM | POA: Diagnosis not present

## 2022-02-27 DIAGNOSIS — N2581 Secondary hyperparathyroidism of renal origin: Secondary | ICD-10-CM | POA: Diagnosis not present

## 2022-02-27 DIAGNOSIS — Z4800 Encounter for change or removal of nonsurgical wound dressing: Secondary | ICD-10-CM | POA: Diagnosis not present

## 2022-02-27 NOTE — Progress Notes (Signed)
    Postoperative Access Visit   History of Present Illness   Samuel Burns is a 37 y.o. year old male who presents for postoperative follow-up for: First stage left arm brachiobasilic fistula creation on 01/27/22. The patient's wounds are well healed.  The patient notes some steal symptoms.  The patient is having trouble holding on to or picking up objects with his left hand.  The patient's current symptoms are: numbness in his 3rd, 4th and 5th fingers and weakness. His is not having any pain, coldness, or ischemic changes.   He has had failed right upper extremity av fistula as well as prior left brachiocephalic av fistula that failed. He currently is working on getting on the renal transplant list  Has a right IJ TDC and dialyzes on MWF at the [place] location   Physical Examination   Vitals:   02/27/22 1402  BP: (!) 123/90  Pulse: 76  Resp: 20  Temp: 98.1 F (36.7 C)  TempSrc: Temporal  SpO2: 95%  Weight: 180 lb 1.6 oz (81.7 kg)  Height: 5\' 7"  (1.702 m)   Body mass index is 28.21 kg/m.  left arm Incision is well healed, 2+ radial pulse, hand grip is 5/5, sensation in digits is mildly decreased in 3rd, 4th and 5th fingers, palpable thrill, bruit can be auscultated    Non invasive studies: Summary: Left Brachia-basilic AVF is occluded at the anastomosis site.   Medical Decision Making   Samuel Burns is a 37 y.o. year old male who presents s/p first stage left arm brachiobasilic fistula creation on 01/27/22. The patient's wounds are well healed.  Unfortunately his fistula is occluded. This will be the second fistula in his left upper extremity. He was previously scheduled for a left AV graft however at time of surgery the basilic vein looked adequate so a fistula was created. He now understands he will need a upper extremity graft. I discussed RUE vs LUE and despite having some numbness in his left fingers he would like to proceed with left upper arm AV graft.  He  dialyzes on MWF. I will arrange this in the near future with Dr. Virl Cagey or Dr. Carlis Abbott on a non dialysis day.   Karoline Caldwell, PA-C [facility] Office: [phone]  Clinic MD: Trula Slade

## 2022-03-02 DIAGNOSIS — N186 End stage renal disease: Secondary | ICD-10-CM | POA: Diagnosis not present

## 2022-03-02 DIAGNOSIS — Z4800 Encounter for change or removal of nonsurgical wound dressing: Secondary | ICD-10-CM | POA: Diagnosis not present

## 2022-03-02 DIAGNOSIS — D689 Coagulation defect, unspecified: Secondary | ICD-10-CM | POA: Diagnosis not present

## 2022-03-02 DIAGNOSIS — N2581 Secondary hyperparathyroidism of renal origin: Secondary | ICD-10-CM | POA: Diagnosis not present

## 2022-03-02 DIAGNOSIS — Z992 Dependence on renal dialysis: Secondary | ICD-10-CM | POA: Diagnosis not present

## 2022-03-03 ENCOUNTER — Other Ambulatory Visit: Payer: Self-pay

## 2022-03-03 ENCOUNTER — Encounter (HOSPITAL_COMMUNITY): Payer: Self-pay | Admitting: Vascular Surgery

## 2022-03-03 NOTE — Progress Notes (Signed)
PCP - Dr. Karle Plumber Cardiologist - denies  PPM/ICD - denies  Chest x-ray - 02/06/22 EKG - 02/06/22 Stress Test - denies ECHO - 11/21/19 Cardiac Cath - denies  CPAP - denies  DM- denies  ASA/Blood Thinner Instructions: n/a   ERAS Protcol - no, NPO  COVID TEST- n/a  Anesthesia review: no  Patient verbally denies any shortness of breath, fever, cough and chest pain during phone call   -------------  SDW INSTRUCTIONS given:  Your procedure is scheduled on 1/18.  Report to [facility] Main Entrance "A" at 0730 A.M., and check in at the Admitting office.  Call this number if you have problems the morning of surgery:  [phone]   Remember:  Do not eat or drink after midnight the night before your surgery      Take these medicines the morning of surgery with A SIP OF WATER  Tylenol PRN Bupropion Clonidine Flonase Metoprolol Clear eyes prn oxyCODONE-acetaminophen (PERCOCET/ROXICET) PRN Zoloft  As of today, STOP taking any Aspirin (unless otherwise instructed by your surgeon) Aleve, Naproxen, Ibuprofen, Motrin, Advil, Goody's, BC's, all herbal medications, fish oil, and all vitamins.                      Do not wear jewelry            Do not wear lotions, powders, colognes, or deodorant.            Men may shave face and neck.            Do not bring valuables to the hospital.            [facility] is not responsible for any belongings or valuables.  Do NOT Smoke (Tobacco/Vaping) 24 hours prior to your procedure If you use a CPAP at night, you may bring all equipment for your overnight stay.   Contacts, glasses, dentures or bridgework may not be worn into surgery.      For patients admitted to the hospital, discharge time will be determined by your treatment team.   Patients discharged the day of surgery will not be allowed to drive home, and someone needs to stay with them for 24 hours.    Special instructions:   [facility]- Preparing For  Surgery  Before surgery, you can play an important role. Because skin is not sterile, your skin needs to be as free of germs as possible. You can reduce the number of germs on your skin by washing with CHG (chlorahexidine gluconate) Soap before surgery.  CHG is an antiseptic cleaner which kills germs and bonds with the skin to continue killing germs even after washing.    Oral Hygiene is also important to reduce your risk of infection.  Remember - BRUSH YOUR TEETH THE MORNING OF SURGERY WITH YOUR REGULAR TOOTHPASTE  Please do not use if you have an allergy to CHG or antibacterial soaps. If your skin becomes reddened/irritated stop using the CHG.  Do not shave (including legs and underarms) for at least 48 hours prior to first CHG shower. It is OK to shave your face.  Please follow these instructions carefully.   Shower the NIGHT BEFORE SURGERY and the MORNING OF SURGERY with DIAL Soap.   Pat yourself dry with a CLEAN TOWEL.  Wear CLEAN PAJAMAS to bed the night before surgery  Place CLEAN SHEETS on your bed the night of your first shower and DO NOT SLEEP WITH PETS.   Day of Surgery:  Please shower morning of surgery  Wear Clean/Comfortable clothing the morning of surgery Do not apply any deodorants/lotions.   Remember to brush your teeth WITH YOUR REGULAR TOOTHPASTE.   Questions were answered. Patient verbalized understanding of instructions.

## 2022-03-04 DIAGNOSIS — Z992 Dependence on renal dialysis: Secondary | ICD-10-CM | POA: Diagnosis not present

## 2022-03-04 DIAGNOSIS — Z4800 Encounter for change or removal of nonsurgical wound dressing: Secondary | ICD-10-CM | POA: Diagnosis not present

## 2022-03-04 DIAGNOSIS — N186 End stage renal disease: Secondary | ICD-10-CM | POA: Diagnosis not present

## 2022-03-04 DIAGNOSIS — N2581 Secondary hyperparathyroidism of renal origin: Secondary | ICD-10-CM | POA: Diagnosis not present

## 2022-03-04 DIAGNOSIS — D689 Coagulation defect, unspecified: Secondary | ICD-10-CM | POA: Diagnosis not present

## 2022-03-04 NOTE — Progress Notes (Signed)
I called the patient regarding his surgery tomorrow.    Samuel Burns continues to have some numbness and paresthesias in the first second and third digits.  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  At the time of his last surgery, he had a high bifurcation of the radial artery which was not noted on preoperative ultrasound.    I would like to see him in clinic with repeat ultrasound as well as to evaluate his left hand symptoms even though this seems to be improving slowly.   Surgery canceled for surgery tomorrow with plans to see him in 2 weeks with repeat arterial duplex.    [name]  MD

## 2022-03-05 ENCOUNTER — Ambulatory Visit (HOSPITAL_COMMUNITY): Admission: RE | Admit: 2022-03-05 | Payer: 59 | Source: Home / Self Care | Admitting: Vascular Surgery

## 2022-03-05 SURGERY — INSERTION OF ARTERIOVENOUS (AV) GORE-TEX GRAFT ARM
Anesthesia: Monitor Anesthesia Care | Laterality: Left

## 2022-03-06 ENCOUNTER — Telehealth: Payer: Self-pay | Admitting: Vascular Surgery

## 2022-03-06 DIAGNOSIS — N186 End stage renal disease: Secondary | ICD-10-CM | POA: Diagnosis not present

## 2022-03-06 DIAGNOSIS — Z4800 Encounter for change or removal of nonsurgical wound dressing: Secondary | ICD-10-CM | POA: Diagnosis not present

## 2022-03-06 DIAGNOSIS — Z992 Dependence on renal dialysis: Secondary | ICD-10-CM | POA: Diagnosis not present

## 2022-03-06 DIAGNOSIS — N2581 Secondary hyperparathyroidism of renal origin: Secondary | ICD-10-CM | POA: Diagnosis not present

## 2022-03-06 DIAGNOSIS — D689 Coagulation defect, unspecified: Secondary | ICD-10-CM | POA: Diagnosis not present

## 2022-03-06 NOTE — Telephone Encounter (Signed)
-----  Message from [name], MD sent at 03/04/2022  5:42 PM EST -----  I called the patient regarding his surgery tomorrow.     Samuel Burns continues to have some numbness and paresthesias in the first second and third digits.  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  At the time of his last surgery, he had a high bifurcation of the radial artery which was not noted on preoperative ultrasound.     I would like to see him in clinic with repeat ultrasound as well as to evaluate his left hand symptoms even though this seems to be improving slowly.    Surgery canceled for surgery tomorrow with plans to see him in 2 weeks with repeat arterial duplex of bilateral arms.     [name]  MD

## 2022-03-08 ENCOUNTER — Other Ambulatory Visit: Payer: Self-pay | Admitting: Internal Medicine

## 2022-03-08 DIAGNOSIS — I129 Hypertensive chronic kidney disease with stage 1 through stage 4 chronic kidney disease, or unspecified chronic kidney disease: Secondary | ICD-10-CM

## 2022-03-09 ENCOUNTER — Other Ambulatory Visit: Payer: Self-pay | Admitting: *Deleted

## 2022-03-09 DIAGNOSIS — Z4800 Encounter for change or removal of nonsurgical wound dressing: Secondary | ICD-10-CM | POA: Diagnosis not present

## 2022-03-09 DIAGNOSIS — Z992 Dependence on renal dialysis: Secondary | ICD-10-CM | POA: Diagnosis not present

## 2022-03-09 DIAGNOSIS — R200 Anesthesia of skin: Secondary | ICD-10-CM

## 2022-03-09 DIAGNOSIS — D689 Coagulation defect, unspecified: Secondary | ICD-10-CM | POA: Diagnosis not present

## 2022-03-09 DIAGNOSIS — N2581 Secondary hyperparathyroidism of renal origin: Secondary | ICD-10-CM | POA: Diagnosis not present

## 2022-03-09 DIAGNOSIS — N186 End stage renal disease: Secondary | ICD-10-CM | POA: Diagnosis not present

## 2022-03-10 NOTE — Telephone Encounter (Signed)
Requested Prescriptions  Pending Prescriptions Disp Refills   Metoprolol Tartrate 75 MG TABS [Pharmacy Med Name: METOPROLOL TARTRATE 75MG  TABLETS] 180 tablet 0    Sig: TAKE 1 TABLET BY MOUTH TWICE DAILY     Cardiovascular:  Beta Blockers Failed - 03/08/2022  3:11 PM      Failed - Last BP in normal range    BP Readings from Last 1 Encounters:  02/27/22 (!) 123/90         Passed - Last Heart Rate in normal range    Pulse Readings from Last 1 Encounters:  02/27/22 76         Passed - Valid encounter within last 6 months    Recent Outpatient Visits           1 week ago ESRD on dialysis [facility])   [place] Elsie Stain, MD   3 months ago Encounter for annual physical exam   [place], [place] W, NP   10 months ago Major depressive disorder, recurrent episode, mild [facility])   [place] Ladell Pier, MD   1 year ago Mild major depression [facility])   [place], MD   1 year ago Erectile dysfunction, unspecified erectile dysfunction type   [place], MD       Future Appointments             In 1 week Ladell Pier, MD [place]

## 2022-03-11 DIAGNOSIS — D689 Coagulation defect, unspecified: Secondary | ICD-10-CM | POA: Diagnosis not present

## 2022-03-11 DIAGNOSIS — N2581 Secondary hyperparathyroidism of renal origin: Secondary | ICD-10-CM | POA: Diagnosis not present

## 2022-03-11 DIAGNOSIS — Z992 Dependence on renal dialysis: Secondary | ICD-10-CM | POA: Diagnosis not present

## 2022-03-11 DIAGNOSIS — N186 End stage renal disease: Secondary | ICD-10-CM | POA: Diagnosis not present

## 2022-03-11 DIAGNOSIS — Z4800 Encounter for change or removal of nonsurgical wound dressing: Secondary | ICD-10-CM | POA: Diagnosis not present

## 2022-03-13 DIAGNOSIS — N2581 Secondary hyperparathyroidism of renal origin: Secondary | ICD-10-CM | POA: Diagnosis not present

## 2022-03-13 DIAGNOSIS — D689 Coagulation defect, unspecified: Secondary | ICD-10-CM | POA: Diagnosis not present

## 2022-03-13 DIAGNOSIS — N186 End stage renal disease: Secondary | ICD-10-CM | POA: Diagnosis not present

## 2022-03-13 DIAGNOSIS — Z4800 Encounter for change or removal of nonsurgical wound dressing: Secondary | ICD-10-CM | POA: Diagnosis not present

## 2022-03-13 DIAGNOSIS — Z992 Dependence on renal dialysis: Secondary | ICD-10-CM | POA: Diagnosis not present

## 2022-03-16 DIAGNOSIS — N186 End stage renal disease: Secondary | ICD-10-CM | POA: Diagnosis not present

## 2022-03-16 DIAGNOSIS — Z992 Dependence on renal dialysis: Secondary | ICD-10-CM | POA: Diagnosis not present

## 2022-03-16 DIAGNOSIS — Z4800 Encounter for change or removal of nonsurgical wound dressing: Secondary | ICD-10-CM | POA: Diagnosis not present

## 2022-03-16 DIAGNOSIS — N2581 Secondary hyperparathyroidism of renal origin: Secondary | ICD-10-CM | POA: Diagnosis not present

## 2022-03-16 DIAGNOSIS — D689 Coagulation defect, unspecified: Secondary | ICD-10-CM | POA: Diagnosis not present

## 2022-03-17 ENCOUNTER — Other Ambulatory Visit: Payer: Self-pay | Admitting: *Deleted

## 2022-03-17 ENCOUNTER — Ambulatory Visit: Payer: Medicare Other | Admitting: Internal Medicine

## 2022-03-17 DIAGNOSIS — N186 End stage renal disease: Secondary | ICD-10-CM

## 2022-03-17 NOTE — Progress Notes (Incomplete)
Needs new access

## 2022-03-18 DIAGNOSIS — N2581 Secondary hyperparathyroidism of renal origin: Secondary | ICD-10-CM | POA: Diagnosis not present

## 2022-03-18 DIAGNOSIS — D689 Coagulation defect, unspecified: Secondary | ICD-10-CM | POA: Diagnosis not present

## 2022-03-18 DIAGNOSIS — I129 Hypertensive chronic kidney disease with stage 1 through stage 4 chronic kidney disease, or unspecified chronic kidney disease: Secondary | ICD-10-CM | POA: Diagnosis not present

## 2022-03-18 DIAGNOSIS — Z992 Dependence on renal dialysis: Secondary | ICD-10-CM | POA: Diagnosis not present

## 2022-03-18 DIAGNOSIS — N186 End stage renal disease: Secondary | ICD-10-CM | POA: Diagnosis not present

## 2022-03-18 DIAGNOSIS — Z4800 Encounter for change or removal of nonsurgical wound dressing: Secondary | ICD-10-CM | POA: Diagnosis not present

## 2022-03-20 ENCOUNTER — Ambulatory Visit (HOSPITAL_COMMUNITY): Payer: 59

## 2022-03-20 ENCOUNTER — Encounter (HOSPITAL_COMMUNITY): Payer: Self-pay

## 2022-03-20 ENCOUNTER — Ambulatory Visit: Payer: 59 | Admitting: Vascular Surgery

## 2022-03-20 ENCOUNTER — Ambulatory Visit (HOSPITAL_COMMUNITY): Payer: 59 | Attending: Vascular Surgery

## 2022-03-20 DIAGNOSIS — D689 Coagulation defect, unspecified: Secondary | ICD-10-CM | POA: Diagnosis not present

## 2022-03-20 DIAGNOSIS — N186 End stage renal disease: Secondary | ICD-10-CM | POA: Diagnosis not present

## 2022-03-20 DIAGNOSIS — Z23 Encounter for immunization: Secondary | ICD-10-CM | POA: Diagnosis not present

## 2022-03-20 DIAGNOSIS — N2581 Secondary hyperparathyroidism of renal origin: Secondary | ICD-10-CM | POA: Diagnosis not present

## 2022-03-20 DIAGNOSIS — Z992 Dependence on renal dialysis: Secondary | ICD-10-CM | POA: Diagnosis not present

## 2022-03-20 DIAGNOSIS — Z4800 Encounter for change or removal of nonsurgical wound dressing: Secondary | ICD-10-CM | POA: Diagnosis not present

## 2022-03-20 DIAGNOSIS — D631 Anemia in chronic kidney disease: Secondary | ICD-10-CM | POA: Diagnosis not present

## 2022-03-23 DIAGNOSIS — N2581 Secondary hyperparathyroidism of renal origin: Secondary | ICD-10-CM | POA: Diagnosis not present

## 2022-03-23 DIAGNOSIS — Z992 Dependence on renal dialysis: Secondary | ICD-10-CM | POA: Diagnosis not present

## 2022-03-23 DIAGNOSIS — N186 End stage renal disease: Secondary | ICD-10-CM | POA: Diagnosis not present

## 2022-03-23 DIAGNOSIS — D689 Coagulation defect, unspecified: Secondary | ICD-10-CM | POA: Diagnosis not present

## 2022-03-23 DIAGNOSIS — Z4800 Encounter for change or removal of nonsurgical wound dressing: Secondary | ICD-10-CM | POA: Diagnosis not present

## 2022-03-24 DIAGNOSIS — Z4800 Encounter for change or removal of nonsurgical wound dressing: Secondary | ICD-10-CM | POA: Diagnosis not present

## 2022-03-24 DIAGNOSIS — D689 Coagulation defect, unspecified: Secondary | ICD-10-CM | POA: Diagnosis not present

## 2022-03-24 DIAGNOSIS — N186 End stage renal disease: Secondary | ICD-10-CM | POA: Diagnosis not present

## 2022-03-24 DIAGNOSIS — Z992 Dependence on renal dialysis: Secondary | ICD-10-CM | POA: Diagnosis not present

## 2022-03-24 DIAGNOSIS — N2581 Secondary hyperparathyroidism of renal origin: Secondary | ICD-10-CM | POA: Diagnosis not present

## 2022-03-26 DIAGNOSIS — N2581 Secondary hyperparathyroidism of renal origin: Secondary | ICD-10-CM | POA: Diagnosis not present

## 2022-03-26 DIAGNOSIS — Z992 Dependence on renal dialysis: Secondary | ICD-10-CM | POA: Diagnosis not present

## 2022-03-26 DIAGNOSIS — Z4800 Encounter for change or removal of nonsurgical wound dressing: Secondary | ICD-10-CM | POA: Diagnosis not present

## 2022-03-26 DIAGNOSIS — D689 Coagulation defect, unspecified: Secondary | ICD-10-CM | POA: Diagnosis not present

## 2022-03-26 DIAGNOSIS — N186 End stage renal disease: Secondary | ICD-10-CM | POA: Diagnosis not present

## 2022-03-27 DIAGNOSIS — N2581 Secondary hyperparathyroidism of renal origin: Secondary | ICD-10-CM | POA: Diagnosis not present

## 2022-03-27 DIAGNOSIS — N186 End stage renal disease: Secondary | ICD-10-CM | POA: Diagnosis not present

## 2022-03-27 DIAGNOSIS — Z4800 Encounter for change or removal of nonsurgical wound dressing: Secondary | ICD-10-CM | POA: Diagnosis not present

## 2022-03-27 DIAGNOSIS — D689 Coagulation defect, unspecified: Secondary | ICD-10-CM | POA: Diagnosis not present

## 2022-03-27 DIAGNOSIS — Z992 Dependence on renal dialysis: Secondary | ICD-10-CM | POA: Diagnosis not present

## 2022-03-30 DIAGNOSIS — Z23 Encounter for immunization: Secondary | ICD-10-CM | POA: Diagnosis not present

## 2022-03-30 DIAGNOSIS — D689 Coagulation defect, unspecified: Secondary | ICD-10-CM | POA: Diagnosis not present

## 2022-03-30 DIAGNOSIS — D631 Anemia in chronic kidney disease: Secondary | ICD-10-CM | POA: Diagnosis not present

## 2022-03-30 DIAGNOSIS — Z4800 Encounter for change or removal of nonsurgical wound dressing: Secondary | ICD-10-CM | POA: Diagnosis not present

## 2022-03-30 DIAGNOSIS — N2581 Secondary hyperparathyroidism of renal origin: Secondary | ICD-10-CM | POA: Diagnosis not present

## 2022-03-30 DIAGNOSIS — Z992 Dependence on renal dialysis: Secondary | ICD-10-CM | POA: Diagnosis not present

## 2022-03-30 DIAGNOSIS — N186 End stage renal disease: Secondary | ICD-10-CM | POA: Diagnosis not present

## 2022-04-03 DIAGNOSIS — Z23 Encounter for immunization: Secondary | ICD-10-CM | POA: Diagnosis not present

## 2022-04-03 DIAGNOSIS — D689 Coagulation defect, unspecified: Secondary | ICD-10-CM | POA: Diagnosis not present

## 2022-04-03 DIAGNOSIS — N2581 Secondary hyperparathyroidism of renal origin: Secondary | ICD-10-CM | POA: Diagnosis not present

## 2022-04-03 DIAGNOSIS — Z4800 Encounter for change or removal of nonsurgical wound dressing: Secondary | ICD-10-CM | POA: Diagnosis not present

## 2022-04-03 DIAGNOSIS — N186 End stage renal disease: Secondary | ICD-10-CM | POA: Diagnosis not present

## 2022-04-03 DIAGNOSIS — D631 Anemia in chronic kidney disease: Secondary | ICD-10-CM | POA: Diagnosis not present

## 2022-04-03 DIAGNOSIS — Z992 Dependence on renal dialysis: Secondary | ICD-10-CM | POA: Diagnosis not present

## 2022-04-04 ENCOUNTER — Other Ambulatory Visit: Payer: Self-pay | Admitting: Internal Medicine

## 2022-04-05 ENCOUNTER — Encounter: Payer: Self-pay | Admitting: Internal Medicine

## 2022-04-05 MED ORDER — SERTRALINE HCL 100 MG PO TABS
100.0000 mg | ORAL_TABLET | Freq: Every morning | ORAL | 4 refills | Status: DC
  Filled 2022-04-05: qty 30, 30d supply, fill #0
  Filled 2022-05-04: qty 30, 30d supply, fill #1
  Filled 2022-06-07 – 2022-06-15 (×2): qty 30, 30d supply, fill #2
  Filled 2022-07-12: qty 30, 30d supply, fill #3
  Filled 2022-08-13: qty 30, 30d supply, fill #4

## 2022-04-05 MED ORDER — BUPROPION HCL ER (XL) 150 MG PO TB24
150.0000 mg | ORAL_TABLET | Freq: Every morning | ORAL | 4 refills | Status: DC
  Filled 2022-04-05: qty 30, 30d supply, fill #0
  Filled 2022-05-04: qty 30, 30d supply, fill #1
  Filled 2022-06-07 – 2022-06-15 (×2): qty 30, 30d supply, fill #2
  Filled 2022-07-12: qty 30, 30d supply, fill #3

## 2022-04-05 MED ORDER — CLONIDINE HCL 0.1 MG PO TABS
0.1000 mg | ORAL_TABLET | Freq: Two times a day (BID) | ORAL | 4 refills | Status: DC
  Filled 2022-04-05: qty 60, 30d supply, fill #0
  Filled 2022-05-04 – 2022-07-12 (×3): qty 60, 30d supply, fill #1
  Filled 2022-08-13 – 2022-10-27 (×4): qty 60, 30d supply, fill #2
  Filled 2022-12-21: qty 60, 30d supply, fill #3
  Filled 2023-01-22 – 2023-02-11 (×2): qty 60, 30d supply, fill #4

## 2022-04-05 MED ORDER — HYDROXYZINE PAMOATE 25 MG PO CAPS
25.0000 mg | ORAL_CAPSULE | Freq: Two times a day (BID) | ORAL | 4 refills | Status: DC
  Filled 2022-04-05: qty 30, 15d supply, fill #0
  Filled 2022-05-04: qty 30, 15d supply, fill #1
  Filled 2022-06-07 – 2022-06-15 (×2): qty 30, 15d supply, fill #2
  Filled 2022-07-12: qty 30, 15d supply, fill #3
  Filled 2022-08-13: qty 30, 15d supply, fill #4

## 2022-04-05 MED ORDER — LOSARTAN POTASSIUM 50 MG PO TABS
50.0000 mg | ORAL_TABLET | Freq: Every morning | ORAL | 4 refills | Status: DC
  Filled 2022-04-05 – 2022-05-04 (×2): qty 30, 30d supply, fill #0
  Filled 2022-06-07 – 2022-06-15 (×2): qty 30, 30d supply, fill #1
  Filled 2022-07-12: qty 30, 30d supply, fill #2
  Filled 2022-08-13: qty 30, 30d supply, fill #3
  Filled 2022-09-14 – 2022-12-21 (×3): qty 30, 30d supply, fill #4

## 2022-04-06 ENCOUNTER — Other Ambulatory Visit: Payer: Self-pay

## 2022-04-06 DIAGNOSIS — Z23 Encounter for immunization: Secondary | ICD-10-CM | POA: Diagnosis not present

## 2022-04-06 DIAGNOSIS — Z4800 Encounter for change or removal of nonsurgical wound dressing: Secondary | ICD-10-CM | POA: Diagnosis not present

## 2022-04-06 DIAGNOSIS — N2581 Secondary hyperparathyroidism of renal origin: Secondary | ICD-10-CM | POA: Diagnosis not present

## 2022-04-06 DIAGNOSIS — Z992 Dependence on renal dialysis: Secondary | ICD-10-CM | POA: Diagnosis not present

## 2022-04-06 DIAGNOSIS — D689 Coagulation defect, unspecified: Secondary | ICD-10-CM | POA: Diagnosis not present

## 2022-04-06 DIAGNOSIS — N186 End stage renal disease: Secondary | ICD-10-CM | POA: Diagnosis not present

## 2022-04-06 DIAGNOSIS — D631 Anemia in chronic kidney disease: Secondary | ICD-10-CM | POA: Diagnosis not present

## 2022-04-08 DIAGNOSIS — Z4800 Encounter for change or removal of nonsurgical wound dressing: Secondary | ICD-10-CM | POA: Diagnosis not present

## 2022-04-08 DIAGNOSIS — N2581 Secondary hyperparathyroidism of renal origin: Secondary | ICD-10-CM | POA: Diagnosis not present

## 2022-04-08 DIAGNOSIS — Z992 Dependence on renal dialysis: Secondary | ICD-10-CM | POA: Diagnosis not present

## 2022-04-08 DIAGNOSIS — N186 End stage renal disease: Secondary | ICD-10-CM | POA: Diagnosis not present

## 2022-04-08 DIAGNOSIS — D631 Anemia in chronic kidney disease: Secondary | ICD-10-CM | POA: Diagnosis not present

## 2022-04-08 DIAGNOSIS — Z23 Encounter for immunization: Secondary | ICD-10-CM | POA: Diagnosis not present

## 2022-04-08 DIAGNOSIS — D689 Coagulation defect, unspecified: Secondary | ICD-10-CM | POA: Diagnosis not present

## 2022-04-10 DIAGNOSIS — N2581 Secondary hyperparathyroidism of renal origin: Secondary | ICD-10-CM | POA: Diagnosis not present

## 2022-04-10 DIAGNOSIS — D689 Coagulation defect, unspecified: Secondary | ICD-10-CM | POA: Diagnosis not present

## 2022-04-10 DIAGNOSIS — Z23 Encounter for immunization: Secondary | ICD-10-CM | POA: Diagnosis not present

## 2022-04-10 DIAGNOSIS — D631 Anemia in chronic kidney disease: Secondary | ICD-10-CM | POA: Diagnosis not present

## 2022-04-10 DIAGNOSIS — Z4800 Encounter for change or removal of nonsurgical wound dressing: Secondary | ICD-10-CM | POA: Diagnosis not present

## 2022-04-10 DIAGNOSIS — Z992 Dependence on renal dialysis: Secondary | ICD-10-CM | POA: Diagnosis not present

## 2022-04-10 DIAGNOSIS — N186 End stage renal disease: Secondary | ICD-10-CM | POA: Diagnosis not present

## 2022-04-13 DIAGNOSIS — N186 End stage renal disease: Secondary | ICD-10-CM | POA: Diagnosis not present

## 2022-04-13 DIAGNOSIS — Z4800 Encounter for change or removal of nonsurgical wound dressing: Secondary | ICD-10-CM | POA: Diagnosis not present

## 2022-04-13 DIAGNOSIS — N2581 Secondary hyperparathyroidism of renal origin: Secondary | ICD-10-CM | POA: Diagnosis not present

## 2022-04-13 DIAGNOSIS — Z23 Encounter for immunization: Secondary | ICD-10-CM | POA: Diagnosis not present

## 2022-04-13 DIAGNOSIS — Z992 Dependence on renal dialysis: Secondary | ICD-10-CM | POA: Diagnosis not present

## 2022-04-13 DIAGNOSIS — D631 Anemia in chronic kidney disease: Secondary | ICD-10-CM | POA: Diagnosis not present

## 2022-04-13 DIAGNOSIS — D689 Coagulation defect, unspecified: Secondary | ICD-10-CM | POA: Diagnosis not present

## 2022-04-15 DIAGNOSIS — N2581 Secondary hyperparathyroidism of renal origin: Secondary | ICD-10-CM | POA: Diagnosis not present

## 2022-04-15 DIAGNOSIS — Z992 Dependence on renal dialysis: Secondary | ICD-10-CM | POA: Diagnosis not present

## 2022-04-15 DIAGNOSIS — N186 End stage renal disease: Secondary | ICD-10-CM | POA: Diagnosis not present

## 2022-04-15 DIAGNOSIS — D689 Coagulation defect, unspecified: Secondary | ICD-10-CM | POA: Diagnosis not present

## 2022-04-15 DIAGNOSIS — Z4800 Encounter for change or removal of nonsurgical wound dressing: Secondary | ICD-10-CM | POA: Diagnosis not present

## 2022-04-15 DIAGNOSIS — D631 Anemia in chronic kidney disease: Secondary | ICD-10-CM | POA: Diagnosis not present

## 2022-04-15 DIAGNOSIS — Z23 Encounter for immunization: Secondary | ICD-10-CM | POA: Diagnosis not present

## 2022-04-16 DIAGNOSIS — Z992 Dependence on renal dialysis: Secondary | ICD-10-CM | POA: Diagnosis not present

## 2022-04-16 DIAGNOSIS — N186 End stage renal disease: Secondary | ICD-10-CM | POA: Diagnosis not present

## 2022-04-16 DIAGNOSIS — I129 Hypertensive chronic kidney disease with stage 1 through stage 4 chronic kidney disease, or unspecified chronic kidney disease: Secondary | ICD-10-CM | POA: Diagnosis not present

## 2022-04-17 DIAGNOSIS — D631 Anemia in chronic kidney disease: Secondary | ICD-10-CM | POA: Diagnosis not present

## 2022-04-17 DIAGNOSIS — N2581 Secondary hyperparathyroidism of renal origin: Secondary | ICD-10-CM | POA: Diagnosis not present

## 2022-04-17 DIAGNOSIS — Z992 Dependence on renal dialysis: Secondary | ICD-10-CM | POA: Diagnosis not present

## 2022-04-17 DIAGNOSIS — D689 Coagulation defect, unspecified: Secondary | ICD-10-CM | POA: Diagnosis not present

## 2022-04-17 DIAGNOSIS — Z4800 Encounter for change or removal of nonsurgical wound dressing: Secondary | ICD-10-CM | POA: Diagnosis not present

## 2022-04-17 DIAGNOSIS — N186 End stage renal disease: Secondary | ICD-10-CM | POA: Diagnosis not present

## 2022-04-20 DIAGNOSIS — D689 Coagulation defect, unspecified: Secondary | ICD-10-CM | POA: Diagnosis not present

## 2022-04-20 DIAGNOSIS — D631 Anemia in chronic kidney disease: Secondary | ICD-10-CM | POA: Diagnosis not present

## 2022-04-20 DIAGNOSIS — Z4800 Encounter for change or removal of nonsurgical wound dressing: Secondary | ICD-10-CM | POA: Diagnosis not present

## 2022-04-20 DIAGNOSIS — Z992 Dependence on renal dialysis: Secondary | ICD-10-CM | POA: Diagnosis not present

## 2022-04-20 DIAGNOSIS — N186 End stage renal disease: Secondary | ICD-10-CM | POA: Diagnosis not present

## 2022-04-20 DIAGNOSIS — N2581 Secondary hyperparathyroidism of renal origin: Secondary | ICD-10-CM | POA: Diagnosis not present

## 2022-04-22 DIAGNOSIS — N2581 Secondary hyperparathyroidism of renal origin: Secondary | ICD-10-CM | POA: Diagnosis not present

## 2022-04-22 DIAGNOSIS — D689 Coagulation defect, unspecified: Secondary | ICD-10-CM | POA: Diagnosis not present

## 2022-04-22 DIAGNOSIS — D631 Anemia in chronic kidney disease: Secondary | ICD-10-CM | POA: Diagnosis not present

## 2022-04-22 DIAGNOSIS — N186 End stage renal disease: Secondary | ICD-10-CM | POA: Diagnosis not present

## 2022-04-22 DIAGNOSIS — Z992 Dependence on renal dialysis: Secondary | ICD-10-CM | POA: Diagnosis not present

## 2022-04-22 DIAGNOSIS — Z4800 Encounter for change or removal of nonsurgical wound dressing: Secondary | ICD-10-CM | POA: Diagnosis not present

## 2022-04-24 DIAGNOSIS — D689 Coagulation defect, unspecified: Secondary | ICD-10-CM | POA: Diagnosis not present

## 2022-04-24 DIAGNOSIS — D631 Anemia in chronic kidney disease: Secondary | ICD-10-CM | POA: Diagnosis not present

## 2022-04-24 DIAGNOSIS — N186 End stage renal disease: Secondary | ICD-10-CM | POA: Diagnosis not present

## 2022-04-24 DIAGNOSIS — Z4800 Encounter for change or removal of nonsurgical wound dressing: Secondary | ICD-10-CM | POA: Diagnosis not present

## 2022-04-24 DIAGNOSIS — Z992 Dependence on renal dialysis: Secondary | ICD-10-CM | POA: Diagnosis not present

## 2022-04-24 DIAGNOSIS — N2581 Secondary hyperparathyroidism of renal origin: Secondary | ICD-10-CM | POA: Diagnosis not present

## 2022-04-27 DIAGNOSIS — Z4800 Encounter for change or removal of nonsurgical wound dressing: Secondary | ICD-10-CM | POA: Diagnosis not present

## 2022-04-27 DIAGNOSIS — N2581 Secondary hyperparathyroidism of renal origin: Secondary | ICD-10-CM | POA: Diagnosis not present

## 2022-04-27 DIAGNOSIS — N186 End stage renal disease: Secondary | ICD-10-CM | POA: Diagnosis not present

## 2022-04-27 DIAGNOSIS — Z992 Dependence on renal dialysis: Secondary | ICD-10-CM | POA: Diagnosis not present

## 2022-04-27 DIAGNOSIS — D631 Anemia in chronic kidney disease: Secondary | ICD-10-CM | POA: Diagnosis not present

## 2022-04-27 DIAGNOSIS — D689 Coagulation defect, unspecified: Secondary | ICD-10-CM | POA: Diagnosis not present

## 2022-04-29 ENCOUNTER — Telehealth: Payer: Self-pay | Admitting: Internal Medicine

## 2022-04-29 DIAGNOSIS — N186 End stage renal disease: Secondary | ICD-10-CM | POA: Diagnosis not present

## 2022-04-29 DIAGNOSIS — D689 Coagulation defect, unspecified: Secondary | ICD-10-CM | POA: Diagnosis not present

## 2022-04-29 DIAGNOSIS — Z992 Dependence on renal dialysis: Secondary | ICD-10-CM | POA: Diagnosis not present

## 2022-04-29 DIAGNOSIS — Z4800 Encounter for change or removal of nonsurgical wound dressing: Secondary | ICD-10-CM | POA: Diagnosis not present

## 2022-04-29 DIAGNOSIS — D631 Anemia in chronic kidney disease: Secondary | ICD-10-CM | POA: Diagnosis not present

## 2022-04-29 DIAGNOSIS — N2581 Secondary hyperparathyroidism of renal origin: Secondary | ICD-10-CM | POA: Diagnosis not present

## 2022-04-29 NOTE — Telephone Encounter (Signed)
Called patient to schedule Medicare Annual Wellness Visit (AWV). Left message for patient to call back and schedule Medicare Annual Wellness Visit (AWV).  Last date of AWV:  due awvi 02/16/21 per [place]    If any questions, please contact me at [phone].  Thank you ,  [place] [employer] AWV direct phone # [phone]

## 2022-05-01 DIAGNOSIS — N186 End stage renal disease: Secondary | ICD-10-CM | POA: Diagnosis not present

## 2022-05-01 DIAGNOSIS — N2581 Secondary hyperparathyroidism of renal origin: Secondary | ICD-10-CM | POA: Diagnosis not present

## 2022-05-01 DIAGNOSIS — D631 Anemia in chronic kidney disease: Secondary | ICD-10-CM | POA: Diagnosis not present

## 2022-05-01 DIAGNOSIS — Z992 Dependence on renal dialysis: Secondary | ICD-10-CM | POA: Diagnosis not present

## 2022-05-01 DIAGNOSIS — Z4800 Encounter for change or removal of nonsurgical wound dressing: Secondary | ICD-10-CM | POA: Diagnosis not present

## 2022-05-01 DIAGNOSIS — D689 Coagulation defect, unspecified: Secondary | ICD-10-CM | POA: Diagnosis not present

## 2022-05-04 ENCOUNTER — Other Ambulatory Visit: Payer: Self-pay

## 2022-05-04 DIAGNOSIS — Z992 Dependence on renal dialysis: Secondary | ICD-10-CM | POA: Diagnosis not present

## 2022-05-04 DIAGNOSIS — D689 Coagulation defect, unspecified: Secondary | ICD-10-CM | POA: Diagnosis not present

## 2022-05-04 DIAGNOSIS — N2581 Secondary hyperparathyroidism of renal origin: Secondary | ICD-10-CM | POA: Diagnosis not present

## 2022-05-04 DIAGNOSIS — N186 End stage renal disease: Secondary | ICD-10-CM | POA: Diagnosis not present

## 2022-05-04 DIAGNOSIS — Z4800 Encounter for change or removal of nonsurgical wound dressing: Secondary | ICD-10-CM | POA: Diagnosis not present

## 2022-05-04 DIAGNOSIS — D631 Anemia in chronic kidney disease: Secondary | ICD-10-CM | POA: Diagnosis not present

## 2022-05-06 DIAGNOSIS — Z992 Dependence on renal dialysis: Secondary | ICD-10-CM | POA: Diagnosis not present

## 2022-05-06 DIAGNOSIS — Z4800 Encounter for change or removal of nonsurgical wound dressing: Secondary | ICD-10-CM | POA: Diagnosis not present

## 2022-05-06 DIAGNOSIS — D689 Coagulation defect, unspecified: Secondary | ICD-10-CM | POA: Diagnosis not present

## 2022-05-06 DIAGNOSIS — N186 End stage renal disease: Secondary | ICD-10-CM | POA: Diagnosis not present

## 2022-05-06 DIAGNOSIS — N2581 Secondary hyperparathyroidism of renal origin: Secondary | ICD-10-CM | POA: Diagnosis not present

## 2022-05-06 DIAGNOSIS — D631 Anemia in chronic kidney disease: Secondary | ICD-10-CM | POA: Diagnosis not present

## 2022-05-07 ENCOUNTER — Other Ambulatory Visit: Payer: Self-pay

## 2022-05-08 DIAGNOSIS — Z992 Dependence on renal dialysis: Secondary | ICD-10-CM | POA: Diagnosis not present

## 2022-05-08 DIAGNOSIS — D689 Coagulation defect, unspecified: Secondary | ICD-10-CM | POA: Diagnosis not present

## 2022-05-08 DIAGNOSIS — D631 Anemia in chronic kidney disease: Secondary | ICD-10-CM | POA: Diagnosis not present

## 2022-05-08 DIAGNOSIS — N186 End stage renal disease: Secondary | ICD-10-CM | POA: Diagnosis not present

## 2022-05-08 DIAGNOSIS — N2581 Secondary hyperparathyroidism of renal origin: Secondary | ICD-10-CM | POA: Diagnosis not present

## 2022-05-08 DIAGNOSIS — Z4800 Encounter for change or removal of nonsurgical wound dressing: Secondary | ICD-10-CM | POA: Diagnosis not present

## 2022-05-11 DIAGNOSIS — Z4800 Encounter for change or removal of nonsurgical wound dressing: Secondary | ICD-10-CM | POA: Diagnosis not present

## 2022-05-11 DIAGNOSIS — D631 Anemia in chronic kidney disease: Secondary | ICD-10-CM | POA: Diagnosis not present

## 2022-05-11 DIAGNOSIS — D689 Coagulation defect, unspecified: Secondary | ICD-10-CM | POA: Diagnosis not present

## 2022-05-11 DIAGNOSIS — N186 End stage renal disease: Secondary | ICD-10-CM | POA: Diagnosis not present

## 2022-05-11 DIAGNOSIS — N2581 Secondary hyperparathyroidism of renal origin: Secondary | ICD-10-CM | POA: Diagnosis not present

## 2022-05-11 DIAGNOSIS — Z992 Dependence on renal dialysis: Secondary | ICD-10-CM | POA: Diagnosis not present

## 2022-05-13 DIAGNOSIS — D631 Anemia in chronic kidney disease: Secondary | ICD-10-CM | POA: Diagnosis not present

## 2022-05-13 DIAGNOSIS — N186 End stage renal disease: Secondary | ICD-10-CM | POA: Diagnosis not present

## 2022-05-13 DIAGNOSIS — Z4800 Encounter for change or removal of nonsurgical wound dressing: Secondary | ICD-10-CM | POA: Diagnosis not present

## 2022-05-13 DIAGNOSIS — N2581 Secondary hyperparathyroidism of renal origin: Secondary | ICD-10-CM | POA: Diagnosis not present

## 2022-05-13 DIAGNOSIS — Z992 Dependence on renal dialysis: Secondary | ICD-10-CM | POA: Diagnosis not present

## 2022-05-13 DIAGNOSIS — D689 Coagulation defect, unspecified: Secondary | ICD-10-CM | POA: Diagnosis not present

## 2022-05-15 DIAGNOSIS — N2581 Secondary hyperparathyroidism of renal origin: Secondary | ICD-10-CM | POA: Diagnosis not present

## 2022-05-15 DIAGNOSIS — D689 Coagulation defect, unspecified: Secondary | ICD-10-CM | POA: Diagnosis not present

## 2022-05-15 DIAGNOSIS — N186 End stage renal disease: Secondary | ICD-10-CM | POA: Diagnosis not present

## 2022-05-15 DIAGNOSIS — D631 Anemia in chronic kidney disease: Secondary | ICD-10-CM | POA: Diagnosis not present

## 2022-05-15 DIAGNOSIS — Z992 Dependence on renal dialysis: Secondary | ICD-10-CM | POA: Diagnosis not present

## 2022-05-15 DIAGNOSIS — Z4800 Encounter for change or removal of nonsurgical wound dressing: Secondary | ICD-10-CM | POA: Diagnosis not present

## 2022-05-17 DIAGNOSIS — Z992 Dependence on renal dialysis: Secondary | ICD-10-CM | POA: Diagnosis not present

## 2022-05-17 DIAGNOSIS — N186 End stage renal disease: Secondary | ICD-10-CM | POA: Diagnosis not present

## 2022-05-17 DIAGNOSIS — I129 Hypertensive chronic kidney disease with stage 1 through stage 4 chronic kidney disease, or unspecified chronic kidney disease: Secondary | ICD-10-CM | POA: Diagnosis not present

## 2022-05-18 DIAGNOSIS — R7879 Finding of abnormal level of heavy metals in blood: Secondary | ICD-10-CM | POA: Diagnosis not present

## 2022-05-18 DIAGNOSIS — N186 End stage renal disease: Secondary | ICD-10-CM | POA: Diagnosis not present

## 2022-05-18 DIAGNOSIS — D631 Anemia in chronic kidney disease: Secondary | ICD-10-CM | POA: Diagnosis not present

## 2022-05-18 DIAGNOSIS — N2581 Secondary hyperparathyroidism of renal origin: Secondary | ICD-10-CM | POA: Diagnosis not present

## 2022-05-18 DIAGNOSIS — Z992 Dependence on renal dialysis: Secondary | ICD-10-CM | POA: Diagnosis not present

## 2022-05-18 DIAGNOSIS — Z4800 Encounter for change or removal of nonsurgical wound dressing: Secondary | ICD-10-CM | POA: Diagnosis not present

## 2022-05-18 DIAGNOSIS — D689 Coagulation defect, unspecified: Secondary | ICD-10-CM | POA: Diagnosis not present

## 2022-05-20 DIAGNOSIS — N2581 Secondary hyperparathyroidism of renal origin: Secondary | ICD-10-CM | POA: Diagnosis not present

## 2022-05-20 DIAGNOSIS — D631 Anemia in chronic kidney disease: Secondary | ICD-10-CM | POA: Diagnosis not present

## 2022-05-20 DIAGNOSIS — Z4800 Encounter for change or removal of nonsurgical wound dressing: Secondary | ICD-10-CM | POA: Diagnosis not present

## 2022-05-20 DIAGNOSIS — Z992 Dependence on renal dialysis: Secondary | ICD-10-CM | POA: Diagnosis not present

## 2022-05-20 DIAGNOSIS — D689 Coagulation defect, unspecified: Secondary | ICD-10-CM | POA: Diagnosis not present

## 2022-05-20 DIAGNOSIS — R7879 Finding of abnormal level of heavy metals in blood: Secondary | ICD-10-CM | POA: Diagnosis not present

## 2022-05-20 DIAGNOSIS — N186 End stage renal disease: Secondary | ICD-10-CM | POA: Diagnosis not present

## 2022-05-22 DIAGNOSIS — R7879 Finding of abnormal level of heavy metals in blood: Secondary | ICD-10-CM | POA: Diagnosis not present

## 2022-05-22 DIAGNOSIS — N186 End stage renal disease: Secondary | ICD-10-CM | POA: Diagnosis not present

## 2022-05-22 DIAGNOSIS — D689 Coagulation defect, unspecified: Secondary | ICD-10-CM | POA: Diagnosis not present

## 2022-05-22 DIAGNOSIS — D631 Anemia in chronic kidney disease: Secondary | ICD-10-CM | POA: Diagnosis not present

## 2022-05-22 DIAGNOSIS — Z992 Dependence on renal dialysis: Secondary | ICD-10-CM | POA: Diagnosis not present

## 2022-05-22 DIAGNOSIS — Z4800 Encounter for change or removal of nonsurgical wound dressing: Secondary | ICD-10-CM | POA: Diagnosis not present

## 2022-05-22 DIAGNOSIS — N2581 Secondary hyperparathyroidism of renal origin: Secondary | ICD-10-CM | POA: Diagnosis not present

## 2022-05-23 IMAGING — XA IR AV DIALY SHUNT INTRO NEEDLE/INTRACATH INITIAL W/PTA/IMG*R*
14 of 18 series · 14 of 24 positions shown · IV contrast (IODINE)
Comparison: none

INDICATION: 35-year-old gentleman with a right brachiocephalic fistula presents
to IR for fistulogram and possible intervention given recent
suboptimal dialysis sessions.

[Series 1: body 4 care · 6 acquisitions, 1 frame shown (1 of 6)]
[im 1/6]
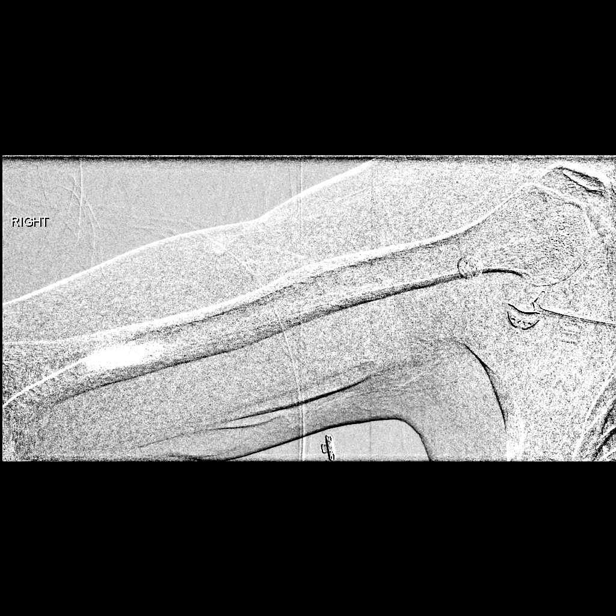

[Series 2: body 4 care · 8 acquisitions, 1 frame shown (2 of 6)]
[im 1/8]
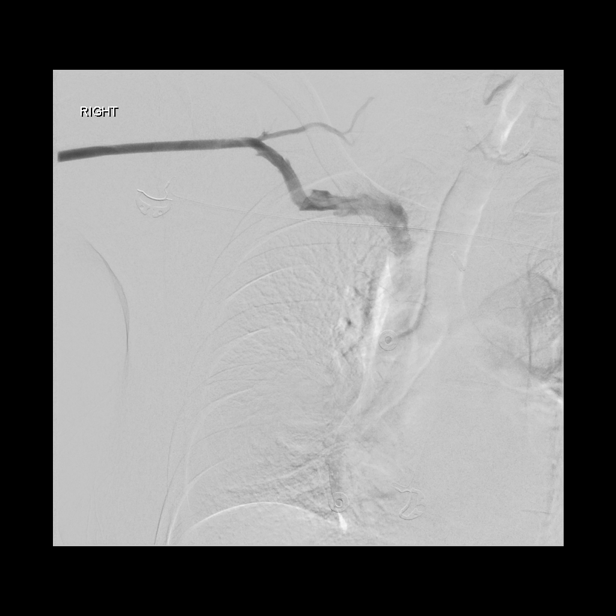

[Series 3: fl (-) angio · 1 of 95 frames shown (1 of 8)]
[frame 2/95]
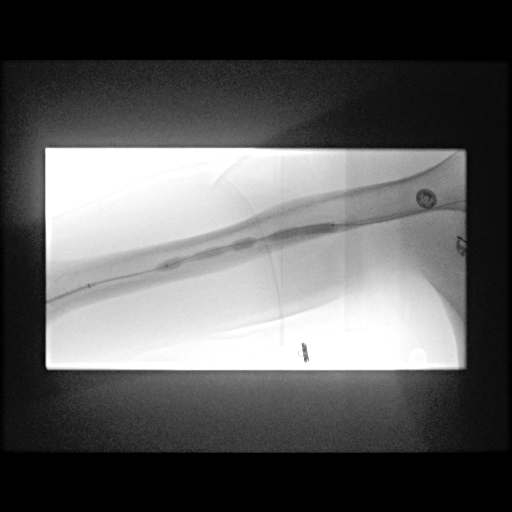

[Series 5: fl (-) angio · 1 of 74 frames shown (2 of 8)]
[frame 38/74]
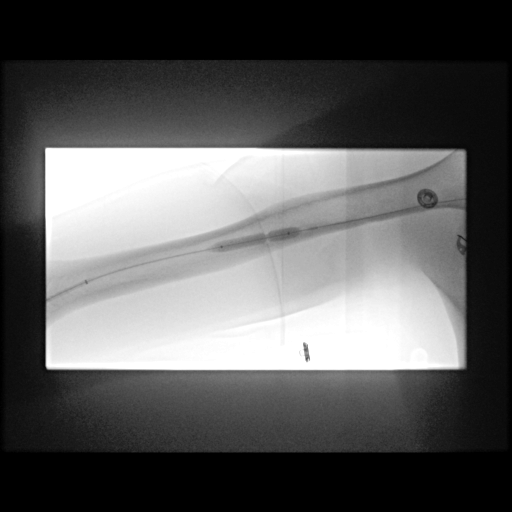

[Series 6: fl (-) angio · 1 of 40 frames shown (3 of 8)]
[frame 21/40]
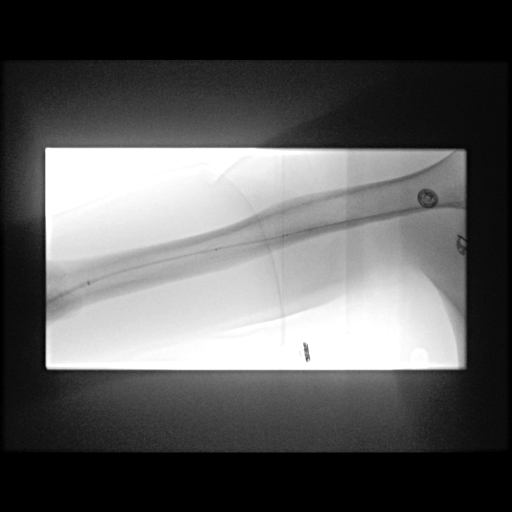

[Series 7: fl (-) angio · 1 of 43 frames shown (4 of 8)]
[frame 37/43]
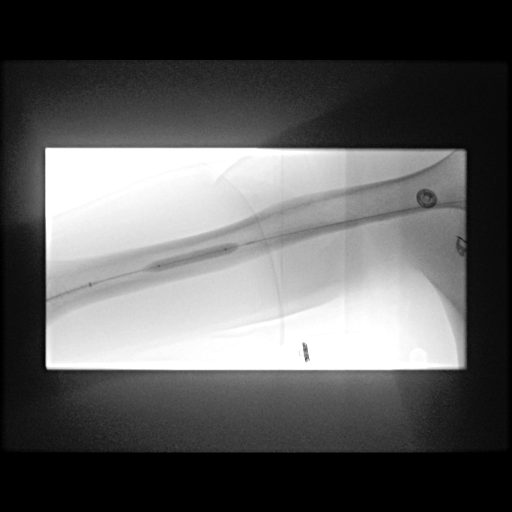

[Series 10: fl (-) angio · 1 of 1 slices shown (5 of 8)]
[im 1/1]
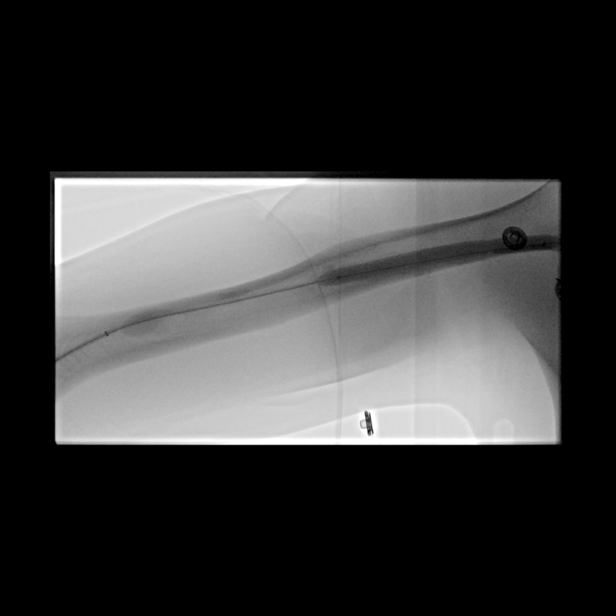

[Series 11: body 4 care · 2 acquisitions, 1 frame shown (3 of 6)]
[im 1/2]
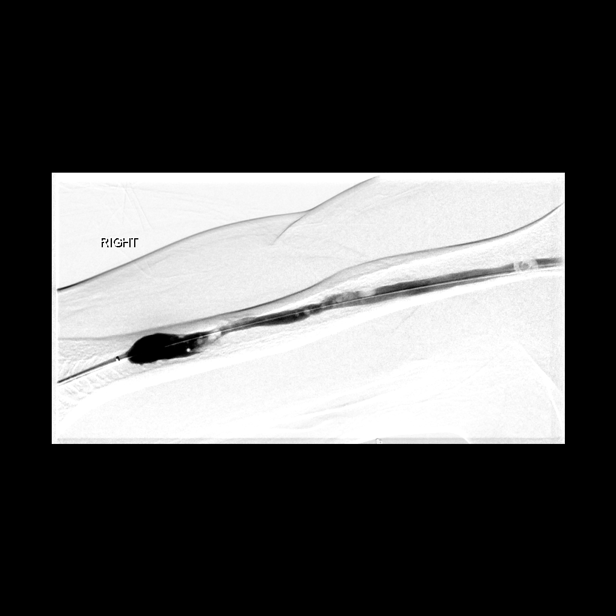

[Series 13: fl (-) angio · 2 acquisitions, 1 frame shown (6 of 8)]
[im 1/2]
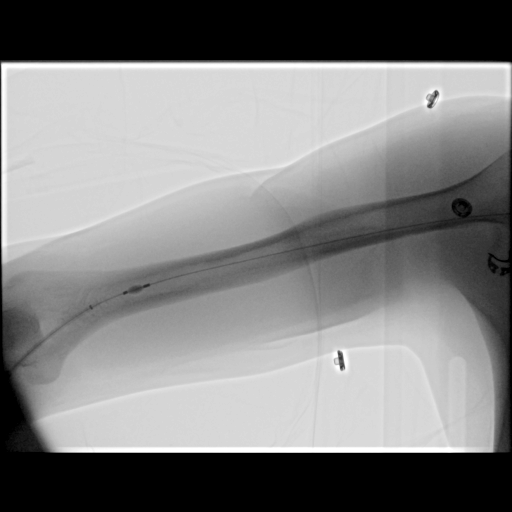

[Series 16: fl (-) angio · 1 of 1 slices shown (7 of 8)]
[im 1/1]
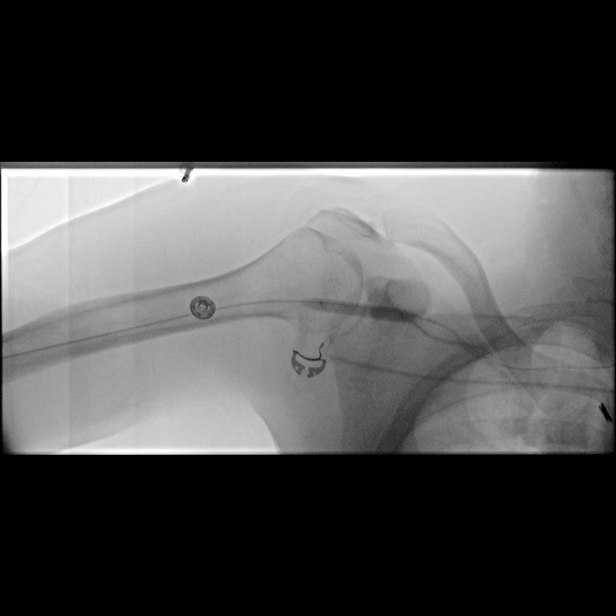

[Series 18: body 4 care · 2 acquisitions, 1 frame shown (4 of 6)]
[im 1/2]
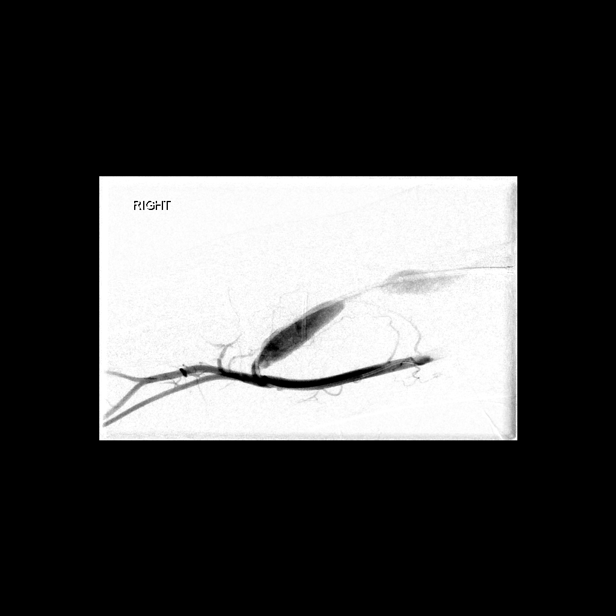

[Series 19: fl (-) angio · 2 acquisitions, 1 frame shown (8 of 8)]
[im 1/2]
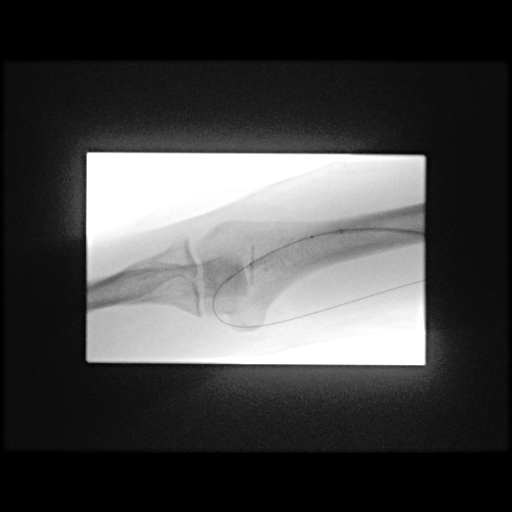

[Series 20: body 4 care · 2 acquisitions, 1 frame shown (5 of 6)]
[im 1/2]
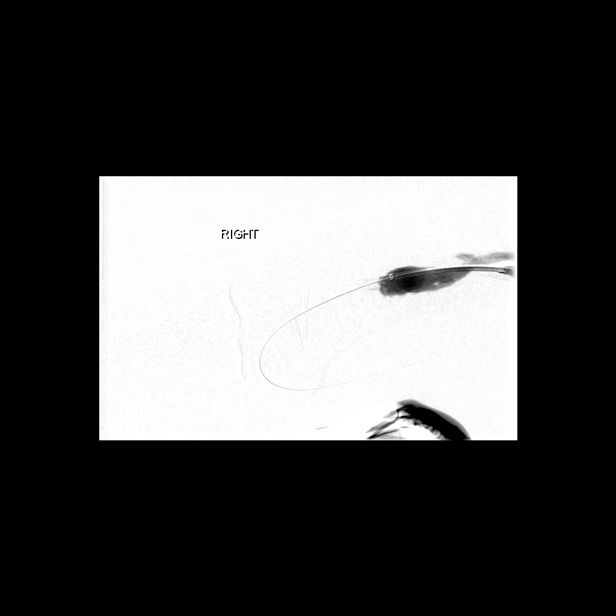

[Series 21: body 4 care · 2 acquisitions, 1 frame shown (6 of 6)]
[im 1/2]
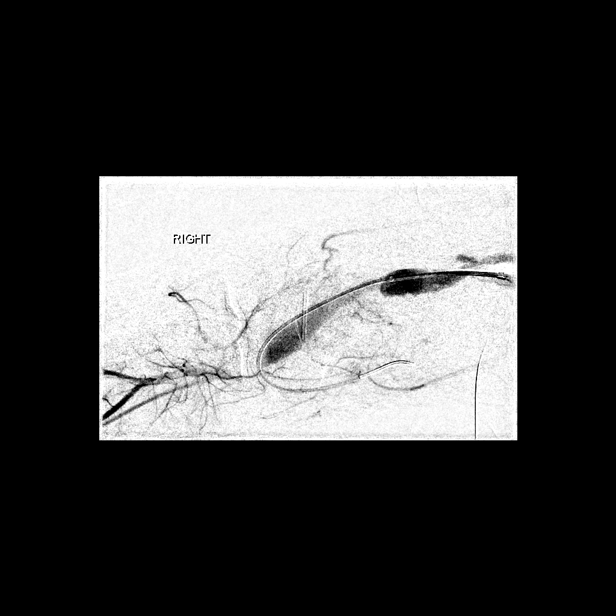

[14 of 24 positions shown; findings below may reference images not displayed]

EXAM:
Ultrasound and fluoroscopy guided right upper extremity fistulagram
and venoplasty

MEDICATIONS:
Heparin 4555 units IV

ANESTHESIA/SEDATION:
Moderate (conscious) sedation was employed during this procedure. A
total of Versed 2.5 mg and Fentanyl 150 mcg was administered
intravenously by the radiology nurse.

Total intra-service moderate Sedation Time: 61 minutes. The
patient's level of consciousness and vital signs were monitored
continuously by radiology nursing throughout the procedure under my
direct supervision.

FLUOROSCOPY TIME:  Fluoroscopy Time: 12 minutes 30 seconds (16 mGy).

COMPLICATIONS:
None immediate.

PROCEDURE:
Informed written consent was obtained from the patient after a
thorough discussion of the procedural risks, benefits and
alternatives. All questions were addressed. Maximal Sterile Barrier
Technique was utilized including caps, mask, sterile gowns, sterile
gloves, sterile drape, hand hygiene and skin antiseptic. A timeout
was performed prior to the initiation of the procedure.

Patient positioned supine on the procedure table. The right upper
arm skin prepped and draped in usual fashion. Ultrasound image
documenting patency of the right radiocephalic fistula was obtained
and placed in permanent medical record. Sterile ultrasound probe
cover and gel utilized throughout the procedure.

Using continuous ultrasound guidance, the fistula was accessed with
a 21 gauge needle. 21 gauge needle exchanged for transitional
dilator [DATE] inch guidewire. Fistulagram demonstrated
multifocal severe hemodynamically significant stenoses of the
cephalic vein in the mid upper arm. No significant stenosis of the
right cephalic arch, axillary, subclavian, and brachiocephalic
veins. No significant stenosis of the superior vena cava.

The multifocal stenoses were plasty with 7 and 8 mm balloons
resulting in excellent luminal gain and improved flow. There
continue to be suboptimal flow through the fistula. Some thrombus
was noted within the proximal segment. Fogarty balloon was utilized
to displaced the thrombus centrally. Given continued suboptimal
flow, decision was made to obtain retrograde access to evaluate the
anastomosis and Peri anastomotic venous outflow.

Ultrasound image documenting patency of the fistula was again
obtained and placed in permanent corrected. Using continuous
ultrasound guidance, the fistula was accessed with a 21 gauge needle
in the retrograde direction. 21 gauge needle exchanged for
transitional dilator [DATE] inch guidewire. [place] the
catheter and Glidewire successfully advanced across the anastomosis
into the brachial artery. Fistulagram demonstrated severe focal
stenosis of the Peri anastomotic venous outflow which was
successfully plasty with a 7 mm balloon resulting in excellent
luminal gain and improved flow.

Both sheaths were removed and hemostasis achieved with pursestring
sutures.
IMPRESSION: Right radiocephalic fistulagram demonstrates multiple severe
hemodynamically significant stenoses of the cephalic vein in the
right upper arm which were successfully plastied with 7 and 8 mm
balloons resulting in excellent luminal gain and improved flow.

ACCESS:
This access remains amenable to future percutaneous interventions as
clinically indicated.

## 2022-05-25 DIAGNOSIS — N186 End stage renal disease: Secondary | ICD-10-CM | POA: Diagnosis not present

## 2022-05-25 DIAGNOSIS — D631 Anemia in chronic kidney disease: Secondary | ICD-10-CM | POA: Diagnosis not present

## 2022-05-25 DIAGNOSIS — Z4800 Encounter for change or removal of nonsurgical wound dressing: Secondary | ICD-10-CM | POA: Diagnosis not present

## 2022-05-25 DIAGNOSIS — D689 Coagulation defect, unspecified: Secondary | ICD-10-CM | POA: Diagnosis not present

## 2022-05-25 DIAGNOSIS — R7879 Finding of abnormal level of heavy metals in blood: Secondary | ICD-10-CM | POA: Diagnosis not present

## 2022-05-25 DIAGNOSIS — N2581 Secondary hyperparathyroidism of renal origin: Secondary | ICD-10-CM | POA: Diagnosis not present

## 2022-05-25 DIAGNOSIS — Z992 Dependence on renal dialysis: Secondary | ICD-10-CM | POA: Diagnosis not present

## 2022-05-27 DIAGNOSIS — Z992 Dependence on renal dialysis: Secondary | ICD-10-CM | POA: Diagnosis not present

## 2022-05-27 DIAGNOSIS — R7879 Finding of abnormal level of heavy metals in blood: Secondary | ICD-10-CM | POA: Diagnosis not present

## 2022-05-27 DIAGNOSIS — N186 End stage renal disease: Secondary | ICD-10-CM | POA: Diagnosis not present

## 2022-05-27 DIAGNOSIS — D689 Coagulation defect, unspecified: Secondary | ICD-10-CM | POA: Diagnosis not present

## 2022-05-27 DIAGNOSIS — Z4800 Encounter for change or removal of nonsurgical wound dressing: Secondary | ICD-10-CM | POA: Diagnosis not present

## 2022-05-27 DIAGNOSIS — N2581 Secondary hyperparathyroidism of renal origin: Secondary | ICD-10-CM | POA: Diagnosis not present

## 2022-05-27 DIAGNOSIS — D631 Anemia in chronic kidney disease: Secondary | ICD-10-CM | POA: Diagnosis not present

## 2022-05-29 DIAGNOSIS — Z4800 Encounter for change or removal of nonsurgical wound dressing: Secondary | ICD-10-CM | POA: Diagnosis not present

## 2022-05-29 DIAGNOSIS — Z992 Dependence on renal dialysis: Secondary | ICD-10-CM | POA: Diagnosis not present

## 2022-05-29 DIAGNOSIS — R7879 Finding of abnormal level of heavy metals in blood: Secondary | ICD-10-CM | POA: Diagnosis not present

## 2022-05-29 DIAGNOSIS — D631 Anemia in chronic kidney disease: Secondary | ICD-10-CM | POA: Diagnosis not present

## 2022-05-29 DIAGNOSIS — D689 Coagulation defect, unspecified: Secondary | ICD-10-CM | POA: Diagnosis not present

## 2022-05-29 DIAGNOSIS — N186 End stage renal disease: Secondary | ICD-10-CM | POA: Diagnosis not present

## 2022-05-29 DIAGNOSIS — N2581 Secondary hyperparathyroidism of renal origin: Secondary | ICD-10-CM | POA: Diagnosis not present

## 2022-06-01 DIAGNOSIS — N2581 Secondary hyperparathyroidism of renal origin: Secondary | ICD-10-CM | POA: Diagnosis not present

## 2022-06-01 DIAGNOSIS — N186 End stage renal disease: Secondary | ICD-10-CM | POA: Diagnosis not present

## 2022-06-01 DIAGNOSIS — Z992 Dependence on renal dialysis: Secondary | ICD-10-CM | POA: Diagnosis not present

## 2022-06-01 DIAGNOSIS — R7879 Finding of abnormal level of heavy metals in blood: Secondary | ICD-10-CM | POA: Diagnosis not present

## 2022-06-01 DIAGNOSIS — Z4800 Encounter for change or removal of nonsurgical wound dressing: Secondary | ICD-10-CM | POA: Diagnosis not present

## 2022-06-01 DIAGNOSIS — D689 Coagulation defect, unspecified: Secondary | ICD-10-CM | POA: Diagnosis not present

## 2022-06-01 DIAGNOSIS — D631 Anemia in chronic kidney disease: Secondary | ICD-10-CM | POA: Diagnosis not present

## 2022-06-03 DIAGNOSIS — D631 Anemia in chronic kidney disease: Secondary | ICD-10-CM | POA: Diagnosis not present

## 2022-06-03 DIAGNOSIS — R7879 Finding of abnormal level of heavy metals in blood: Secondary | ICD-10-CM | POA: Diagnosis not present

## 2022-06-03 DIAGNOSIS — N2581 Secondary hyperparathyroidism of renal origin: Secondary | ICD-10-CM | POA: Diagnosis not present

## 2022-06-03 DIAGNOSIS — Z4800 Encounter for change or removal of nonsurgical wound dressing: Secondary | ICD-10-CM | POA: Diagnosis not present

## 2022-06-03 DIAGNOSIS — D689 Coagulation defect, unspecified: Secondary | ICD-10-CM | POA: Diagnosis not present

## 2022-06-03 DIAGNOSIS — N186 End stage renal disease: Secondary | ICD-10-CM | POA: Diagnosis not present

## 2022-06-03 DIAGNOSIS — Z992 Dependence on renal dialysis: Secondary | ICD-10-CM | POA: Diagnosis not present

## 2022-06-05 DIAGNOSIS — D631 Anemia in chronic kidney disease: Secondary | ICD-10-CM | POA: Diagnosis not present

## 2022-06-05 DIAGNOSIS — N186 End stage renal disease: Secondary | ICD-10-CM | POA: Diagnosis not present

## 2022-06-05 DIAGNOSIS — R7879 Finding of abnormal level of heavy metals in blood: Secondary | ICD-10-CM | POA: Diagnosis not present

## 2022-06-05 DIAGNOSIS — N2581 Secondary hyperparathyroidism of renal origin: Secondary | ICD-10-CM | POA: Diagnosis not present

## 2022-06-05 DIAGNOSIS — D689 Coagulation defect, unspecified: Secondary | ICD-10-CM | POA: Diagnosis not present

## 2022-06-05 DIAGNOSIS — Z992 Dependence on renal dialysis: Secondary | ICD-10-CM | POA: Diagnosis not present

## 2022-06-05 DIAGNOSIS — Z4800 Encounter for change or removal of nonsurgical wound dressing: Secondary | ICD-10-CM | POA: Diagnosis not present

## 2022-06-08 ENCOUNTER — Other Ambulatory Visit: Payer: Self-pay

## 2022-06-08 DIAGNOSIS — D631 Anemia in chronic kidney disease: Secondary | ICD-10-CM | POA: Diagnosis not present

## 2022-06-08 DIAGNOSIS — N2581 Secondary hyperparathyroidism of renal origin: Secondary | ICD-10-CM | POA: Diagnosis not present

## 2022-06-08 DIAGNOSIS — Z992 Dependence on renal dialysis: Secondary | ICD-10-CM | POA: Diagnosis not present

## 2022-06-08 DIAGNOSIS — D689 Coagulation defect, unspecified: Secondary | ICD-10-CM | POA: Diagnosis not present

## 2022-06-08 DIAGNOSIS — R7879 Finding of abnormal level of heavy metals in blood: Secondary | ICD-10-CM | POA: Diagnosis not present

## 2022-06-08 DIAGNOSIS — N186 End stage renal disease: Secondary | ICD-10-CM | POA: Diagnosis not present

## 2022-06-08 DIAGNOSIS — Z4800 Encounter for change or removal of nonsurgical wound dressing: Secondary | ICD-10-CM | POA: Diagnosis not present

## 2022-06-10 DIAGNOSIS — D689 Coagulation defect, unspecified: Secondary | ICD-10-CM | POA: Diagnosis not present

## 2022-06-10 DIAGNOSIS — Z4800 Encounter for change or removal of nonsurgical wound dressing: Secondary | ICD-10-CM | POA: Diagnosis not present

## 2022-06-10 DIAGNOSIS — Z992 Dependence on renal dialysis: Secondary | ICD-10-CM | POA: Diagnosis not present

## 2022-06-10 DIAGNOSIS — D631 Anemia in chronic kidney disease: Secondary | ICD-10-CM | POA: Diagnosis not present

## 2022-06-10 DIAGNOSIS — N2581 Secondary hyperparathyroidism of renal origin: Secondary | ICD-10-CM | POA: Diagnosis not present

## 2022-06-10 DIAGNOSIS — R7879 Finding of abnormal level of heavy metals in blood: Secondary | ICD-10-CM | POA: Diagnosis not present

## 2022-06-10 DIAGNOSIS — N186 End stage renal disease: Secondary | ICD-10-CM | POA: Diagnosis not present

## 2022-06-12 DIAGNOSIS — D631 Anemia in chronic kidney disease: Secondary | ICD-10-CM | POA: Diagnosis not present

## 2022-06-12 DIAGNOSIS — D689 Coagulation defect, unspecified: Secondary | ICD-10-CM | POA: Diagnosis not present

## 2022-06-12 DIAGNOSIS — R7879 Finding of abnormal level of heavy metals in blood: Secondary | ICD-10-CM | POA: Diagnosis not present

## 2022-06-12 DIAGNOSIS — Z992 Dependence on renal dialysis: Secondary | ICD-10-CM | POA: Diagnosis not present

## 2022-06-12 DIAGNOSIS — N2581 Secondary hyperparathyroidism of renal origin: Secondary | ICD-10-CM | POA: Diagnosis not present

## 2022-06-12 DIAGNOSIS — Z4800 Encounter for change or removal of nonsurgical wound dressing: Secondary | ICD-10-CM | POA: Diagnosis not present

## 2022-06-12 DIAGNOSIS — N186 End stage renal disease: Secondary | ICD-10-CM | POA: Diagnosis not present

## 2022-06-15 ENCOUNTER — Other Ambulatory Visit: Payer: Self-pay

## 2022-06-15 DIAGNOSIS — N2581 Secondary hyperparathyroidism of renal origin: Secondary | ICD-10-CM | POA: Diagnosis not present

## 2022-06-15 DIAGNOSIS — D689 Coagulation defect, unspecified: Secondary | ICD-10-CM | POA: Diagnosis not present

## 2022-06-15 DIAGNOSIS — Z4800 Encounter for change or removal of nonsurgical wound dressing: Secondary | ICD-10-CM | POA: Diagnosis not present

## 2022-06-15 DIAGNOSIS — Z992 Dependence on renal dialysis: Secondary | ICD-10-CM | POA: Diagnosis not present

## 2022-06-15 DIAGNOSIS — D631 Anemia in chronic kidney disease: Secondary | ICD-10-CM | POA: Diagnosis not present

## 2022-06-15 DIAGNOSIS — N186 End stage renal disease: Secondary | ICD-10-CM | POA: Diagnosis not present

## 2022-06-15 DIAGNOSIS — R7879 Finding of abnormal level of heavy metals in blood: Secondary | ICD-10-CM | POA: Diagnosis not present

## 2022-06-16 DIAGNOSIS — I129 Hypertensive chronic kidney disease with stage 1 through stage 4 chronic kidney disease, or unspecified chronic kidney disease: Secondary | ICD-10-CM | POA: Diagnosis not present

## 2022-06-16 DIAGNOSIS — N186 End stage renal disease: Secondary | ICD-10-CM | POA: Diagnosis not present

## 2022-06-16 DIAGNOSIS — Z992 Dependence on renal dialysis: Secondary | ICD-10-CM | POA: Diagnosis not present

## 2022-06-17 DIAGNOSIS — D689 Coagulation defect, unspecified: Secondary | ICD-10-CM | POA: Diagnosis not present

## 2022-06-17 DIAGNOSIS — Z992 Dependence on renal dialysis: Secondary | ICD-10-CM | POA: Diagnosis not present

## 2022-06-17 DIAGNOSIS — D631 Anemia in chronic kidney disease: Secondary | ICD-10-CM | POA: Diagnosis not present

## 2022-06-17 DIAGNOSIS — N186 End stage renal disease: Secondary | ICD-10-CM | POA: Diagnosis not present

## 2022-06-17 DIAGNOSIS — N2581 Secondary hyperparathyroidism of renal origin: Secondary | ICD-10-CM | POA: Diagnosis not present

## 2022-06-17 DIAGNOSIS — Z4800 Encounter for change or removal of nonsurgical wound dressing: Secondary | ICD-10-CM | POA: Diagnosis not present

## 2022-06-19 DIAGNOSIS — D631 Anemia in chronic kidney disease: Secondary | ICD-10-CM | POA: Diagnosis not present

## 2022-06-19 DIAGNOSIS — N2581 Secondary hyperparathyroidism of renal origin: Secondary | ICD-10-CM | POA: Diagnosis not present

## 2022-06-19 DIAGNOSIS — N186 End stage renal disease: Secondary | ICD-10-CM | POA: Diagnosis not present

## 2022-06-19 DIAGNOSIS — D689 Coagulation defect, unspecified: Secondary | ICD-10-CM | POA: Diagnosis not present

## 2022-06-19 DIAGNOSIS — Z4800 Encounter for change or removal of nonsurgical wound dressing: Secondary | ICD-10-CM | POA: Diagnosis not present

## 2022-06-19 DIAGNOSIS — Z992 Dependence on renal dialysis: Secondary | ICD-10-CM | POA: Diagnosis not present

## 2022-06-22 DIAGNOSIS — Z992 Dependence on renal dialysis: Secondary | ICD-10-CM | POA: Diagnosis not present

## 2022-06-22 DIAGNOSIS — D631 Anemia in chronic kidney disease: Secondary | ICD-10-CM | POA: Diagnosis not present

## 2022-06-22 DIAGNOSIS — N186 End stage renal disease: Secondary | ICD-10-CM | POA: Diagnosis not present

## 2022-06-22 DIAGNOSIS — D689 Coagulation defect, unspecified: Secondary | ICD-10-CM | POA: Diagnosis not present

## 2022-06-22 DIAGNOSIS — Z4800 Encounter for change or removal of nonsurgical wound dressing: Secondary | ICD-10-CM | POA: Diagnosis not present

## 2022-06-22 DIAGNOSIS — N2581 Secondary hyperparathyroidism of renal origin: Secondary | ICD-10-CM | POA: Diagnosis not present

## 2022-06-24 ENCOUNTER — Encounter: Payer: Self-pay | Admitting: Internal Medicine

## 2022-06-24 DIAGNOSIS — N186 End stage renal disease: Secondary | ICD-10-CM | POA: Diagnosis not present

## 2022-06-24 DIAGNOSIS — Z992 Dependence on renal dialysis: Secondary | ICD-10-CM | POA: Diagnosis not present

## 2022-06-24 DIAGNOSIS — D689 Coagulation defect, unspecified: Secondary | ICD-10-CM | POA: Diagnosis not present

## 2022-06-24 DIAGNOSIS — N2581 Secondary hyperparathyroidism of renal origin: Secondary | ICD-10-CM | POA: Diagnosis not present

## 2022-06-24 DIAGNOSIS — Z4800 Encounter for change or removal of nonsurgical wound dressing: Secondary | ICD-10-CM | POA: Diagnosis not present

## 2022-06-24 DIAGNOSIS — D631 Anemia in chronic kidney disease: Secondary | ICD-10-CM | POA: Diagnosis not present

## 2022-06-26 ENCOUNTER — Telehealth: Payer: Self-pay | Admitting: Internal Medicine

## 2022-06-26 DIAGNOSIS — N2581 Secondary hyperparathyroidism of renal origin: Secondary | ICD-10-CM | POA: Diagnosis not present

## 2022-06-26 DIAGNOSIS — D689 Coagulation defect, unspecified: Secondary | ICD-10-CM | POA: Diagnosis not present

## 2022-06-26 DIAGNOSIS — N186 End stage renal disease: Secondary | ICD-10-CM | POA: Diagnosis not present

## 2022-06-26 DIAGNOSIS — D631 Anemia in chronic kidney disease: Secondary | ICD-10-CM | POA: Diagnosis not present

## 2022-06-26 DIAGNOSIS — Z992 Dependence on renal dialysis: Secondary | ICD-10-CM | POA: Diagnosis not present

## 2022-06-26 DIAGNOSIS — Z4800 Encounter for change or removal of nonsurgical wound dressing: Secondary | ICD-10-CM | POA: Diagnosis not present

## 2022-06-26 NOTE — Telephone Encounter (Signed)
Copied from CRM [id number]. Topic: Medicare AWV >> Jun 26, 2022 12:19 PM Rushie Goltz wrote: Reason for CRM: Called patient to schedule Medicare Annual Wellness Visit (AWV). Left message for patient to call back and schedule Medicare Annual Wellness Visit (AWV).  Last date of AWV:  AWVI eligible as of 02/16/21   Please schedule an AWVI appointment at any time with [facility] VISIT.  If any questions, please contact me at [phone].   Thank you,  [facility] Support [facility] Direct dial  [phone]

## 2022-06-29 DIAGNOSIS — N186 End stage renal disease: Secondary | ICD-10-CM | POA: Diagnosis not present

## 2022-06-29 DIAGNOSIS — N2581 Secondary hyperparathyroidism of renal origin: Secondary | ICD-10-CM | POA: Diagnosis not present

## 2022-06-29 DIAGNOSIS — Z992 Dependence on renal dialysis: Secondary | ICD-10-CM | POA: Diagnosis not present

## 2022-06-29 DIAGNOSIS — Z4800 Encounter for change or removal of nonsurgical wound dressing: Secondary | ICD-10-CM | POA: Diagnosis not present

## 2022-06-29 DIAGNOSIS — D631 Anemia in chronic kidney disease: Secondary | ICD-10-CM | POA: Diagnosis not present

## 2022-06-29 DIAGNOSIS — D689 Coagulation defect, unspecified: Secondary | ICD-10-CM | POA: Diagnosis not present

## 2022-07-01 DIAGNOSIS — N186 End stage renal disease: Secondary | ICD-10-CM | POA: Diagnosis not present

## 2022-07-01 DIAGNOSIS — Z992 Dependence on renal dialysis: Secondary | ICD-10-CM | POA: Diagnosis not present

## 2022-07-01 DIAGNOSIS — D689 Coagulation defect, unspecified: Secondary | ICD-10-CM | POA: Diagnosis not present

## 2022-07-01 DIAGNOSIS — Z4800 Encounter for change or removal of nonsurgical wound dressing: Secondary | ICD-10-CM | POA: Diagnosis not present

## 2022-07-01 DIAGNOSIS — D631 Anemia in chronic kidney disease: Secondary | ICD-10-CM | POA: Diagnosis not present

## 2022-07-01 DIAGNOSIS — N2581 Secondary hyperparathyroidism of renal origin: Secondary | ICD-10-CM | POA: Diagnosis not present

## 2022-07-03 DIAGNOSIS — N186 End stage renal disease: Secondary | ICD-10-CM | POA: Diagnosis not present

## 2022-07-03 DIAGNOSIS — Z992 Dependence on renal dialysis: Secondary | ICD-10-CM | POA: Diagnosis not present

## 2022-07-03 DIAGNOSIS — N2581 Secondary hyperparathyroidism of renal origin: Secondary | ICD-10-CM | POA: Diagnosis not present

## 2022-07-03 DIAGNOSIS — Z4800 Encounter for change or removal of nonsurgical wound dressing: Secondary | ICD-10-CM | POA: Diagnosis not present

## 2022-07-03 DIAGNOSIS — D689 Coagulation defect, unspecified: Secondary | ICD-10-CM | POA: Diagnosis not present

## 2022-07-03 DIAGNOSIS — D631 Anemia in chronic kidney disease: Secondary | ICD-10-CM | POA: Diagnosis not present

## 2022-07-06 DIAGNOSIS — D631 Anemia in chronic kidney disease: Secondary | ICD-10-CM | POA: Diagnosis not present

## 2022-07-06 DIAGNOSIS — Z992 Dependence on renal dialysis: Secondary | ICD-10-CM | POA: Diagnosis not present

## 2022-07-06 DIAGNOSIS — D689 Coagulation defect, unspecified: Secondary | ICD-10-CM | POA: Diagnosis not present

## 2022-07-06 DIAGNOSIS — N186 End stage renal disease: Secondary | ICD-10-CM | POA: Diagnosis not present

## 2022-07-06 DIAGNOSIS — Z4800 Encounter for change or removal of nonsurgical wound dressing: Secondary | ICD-10-CM | POA: Diagnosis not present

## 2022-07-06 DIAGNOSIS — N2581 Secondary hyperparathyroidism of renal origin: Secondary | ICD-10-CM | POA: Diagnosis not present

## 2022-07-08 DIAGNOSIS — D631 Anemia in chronic kidney disease: Secondary | ICD-10-CM | POA: Diagnosis not present

## 2022-07-08 DIAGNOSIS — Z4800 Encounter for change or removal of nonsurgical wound dressing: Secondary | ICD-10-CM | POA: Diagnosis not present

## 2022-07-08 DIAGNOSIS — N186 End stage renal disease: Secondary | ICD-10-CM | POA: Diagnosis not present

## 2022-07-08 DIAGNOSIS — D689 Coagulation defect, unspecified: Secondary | ICD-10-CM | POA: Diagnosis not present

## 2022-07-08 DIAGNOSIS — Z992 Dependence on renal dialysis: Secondary | ICD-10-CM | POA: Diagnosis not present

## 2022-07-08 DIAGNOSIS — N2581 Secondary hyperparathyroidism of renal origin: Secondary | ICD-10-CM | POA: Diagnosis not present

## 2022-07-10 DIAGNOSIS — N186 End stage renal disease: Secondary | ICD-10-CM | POA: Diagnosis not present

## 2022-07-10 DIAGNOSIS — D631 Anemia in chronic kidney disease: Secondary | ICD-10-CM | POA: Diagnosis not present

## 2022-07-10 DIAGNOSIS — Z992 Dependence on renal dialysis: Secondary | ICD-10-CM | POA: Diagnosis not present

## 2022-07-10 DIAGNOSIS — N2581 Secondary hyperparathyroidism of renal origin: Secondary | ICD-10-CM | POA: Diagnosis not present

## 2022-07-10 DIAGNOSIS — D689 Coagulation defect, unspecified: Secondary | ICD-10-CM | POA: Diagnosis not present

## 2022-07-10 DIAGNOSIS — Z4800 Encounter for change or removal of nonsurgical wound dressing: Secondary | ICD-10-CM | POA: Diagnosis not present

## 2022-07-13 DIAGNOSIS — D631 Anemia in chronic kidney disease: Secondary | ICD-10-CM | POA: Diagnosis not present

## 2022-07-13 DIAGNOSIS — N2581 Secondary hyperparathyroidism of renal origin: Secondary | ICD-10-CM | POA: Diagnosis not present

## 2022-07-13 DIAGNOSIS — D689 Coagulation defect, unspecified: Secondary | ICD-10-CM | POA: Diagnosis not present

## 2022-07-13 DIAGNOSIS — Z992 Dependence on renal dialysis: Secondary | ICD-10-CM | POA: Diagnosis not present

## 2022-07-13 DIAGNOSIS — Z4800 Encounter for change or removal of nonsurgical wound dressing: Secondary | ICD-10-CM | POA: Diagnosis not present

## 2022-07-13 DIAGNOSIS — N186 End stage renal disease: Secondary | ICD-10-CM | POA: Diagnosis not present

## 2022-07-14 ENCOUNTER — Other Ambulatory Visit: Payer: Self-pay

## 2022-07-15 DIAGNOSIS — Z4800 Encounter for change or removal of nonsurgical wound dressing: Secondary | ICD-10-CM | POA: Diagnosis not present

## 2022-07-15 DIAGNOSIS — D631 Anemia in chronic kidney disease: Secondary | ICD-10-CM | POA: Diagnosis not present

## 2022-07-15 DIAGNOSIS — Z992 Dependence on renal dialysis: Secondary | ICD-10-CM | POA: Diagnosis not present

## 2022-07-15 DIAGNOSIS — D689 Coagulation defect, unspecified: Secondary | ICD-10-CM | POA: Diagnosis not present

## 2022-07-15 DIAGNOSIS — N2581 Secondary hyperparathyroidism of renal origin: Secondary | ICD-10-CM | POA: Diagnosis not present

## 2022-07-15 DIAGNOSIS — N186 End stage renal disease: Secondary | ICD-10-CM | POA: Diagnosis not present

## 2022-07-17 ENCOUNTER — Other Ambulatory Visit: Payer: Self-pay

## 2022-07-17 ENCOUNTER — Encounter (HOSPITAL_COMMUNITY): Payer: Self-pay

## 2022-07-17 ENCOUNTER — Emergency Department (HOSPITAL_COMMUNITY)
Admission: EM | Admit: 2022-07-17 | Discharge: 2022-07-17 | Disposition: A | Discharge status: Home / Self Care | Payer: 59 | Attending: Emergency Medicine | Admitting: Emergency Medicine

## 2022-07-17 DIAGNOSIS — Z992 Dependence on renal dialysis: Secondary | ICD-10-CM | POA: Insufficient documentation

## 2022-07-17 DIAGNOSIS — J45909 Unspecified asthma, uncomplicated: Secondary | ICD-10-CM | POA: Insufficient documentation

## 2022-07-17 DIAGNOSIS — I129 Hypertensive chronic kidney disease with stage 1 through stage 4 chronic kidney disease, or unspecified chronic kidney disease: Secondary | ICD-10-CM | POA: Diagnosis not present

## 2022-07-17 DIAGNOSIS — R569 Unspecified convulsions: Secondary | ICD-10-CM | POA: Diagnosis not present

## 2022-07-17 DIAGNOSIS — I499 Cardiac arrhythmia, unspecified: Secondary | ICD-10-CM | POA: Diagnosis not present

## 2022-07-17 DIAGNOSIS — R6889 Other general symptoms and signs: Secondary | ICD-10-CM | POA: Diagnosis not present

## 2022-07-17 DIAGNOSIS — Z7951 Long term (current) use of inhaled steroids: Secondary | ICD-10-CM | POA: Diagnosis not present

## 2022-07-17 DIAGNOSIS — Z79899 Other long term (current) drug therapy: Secondary | ICD-10-CM | POA: Diagnosis not present

## 2022-07-17 DIAGNOSIS — Z743 Need for continuous supervision: Secondary | ICD-10-CM | POA: Diagnosis not present

## 2022-07-17 DIAGNOSIS — N186 End stage renal disease: Secondary | ICD-10-CM | POA: Insufficient documentation

## 2022-07-17 DIAGNOSIS — R404 Transient alteration of awareness: Secondary | ICD-10-CM | POA: Insufficient documentation

## 2022-07-17 DIAGNOSIS — R4689 Other symptoms and signs involving appearance and behavior: Secondary | ICD-10-CM | POA: Diagnosis present

## 2022-07-17 DIAGNOSIS — R000 Tachycardia, unspecified: Secondary | ICD-10-CM | POA: Diagnosis not present

## 2022-07-17 LAB — CBC WITH DIFFERENTIAL/PLATELET
Abs Immature Granulocytes: 0.05 10*3/uL (ref 0.00–0.07)
Basophils Absolute: 0.1 10*3/uL (ref 0.0–0.1)
Basophils Relative: 1 %
Eosinophils Absolute: 0.2 10*3/uL (ref 0.0–0.5)
Eosinophils Relative: 1 %
HCT: 32.1 % — ABNORMAL LOW (ref 39.0–52.0)
Hemoglobin: 10.4 g/dL — ABNORMAL LOW (ref 13.0–17.0)
Immature Granulocytes: 0 %
Lymphocytes Relative: 20 %
Lymphs Abs: 2.3 10*3/uL (ref 0.7–4.0)
MCH: 31 pg (ref 26.0–34.0)
MCHC: 32.4 g/dL (ref 30.0–36.0)
MCV: 95.5 fL (ref 80.0–100.0)
Monocytes Absolute: 0.8 10*3/uL (ref 0.1–1.0)
Monocytes Relative: 7 %
Neutro Abs: 8.2 10*3/uL — ABNORMAL HIGH (ref 1.7–7.7)
Neutrophils Relative %: 71 %
Platelets: 254 10*3/uL (ref 150–400)
RBC: 3.36 MIL/uL — ABNORMAL LOW (ref 4.22–5.81)
RDW: 15.1 % (ref 11.5–15.5)
WBC: 11.6 10*3/uL — ABNORMAL HIGH (ref 4.0–10.5)
nRBC: 0 % (ref 0.0–0.2)

## 2022-07-17 LAB — COMPREHENSIVE METABOLIC PANEL
ALT: 16 U/L (ref 0–44)
AST: 20 U/L (ref 15–41)
Albumin: 4.4 g/dL (ref 3.5–5.0)
Alkaline Phosphatase: 97 U/L (ref 38–126)
Anion gap: 23 — ABNORMAL HIGH (ref 5–15)
BUN: 69 mg/dL — ABNORMAL HIGH (ref 6–20)
CO2: 19 mmol/L — ABNORMAL LOW (ref 22–32)
Calcium: 9.9 mg/dL (ref 8.9–10.3)
Chloride: 91 mmol/L — ABNORMAL LOW (ref 98–111)
Creatinine, Ser: 12.06 mg/dL — ABNORMAL HIGH (ref 0.61–1.24)
GFR, Estimated: 5 mL/min — ABNORMAL LOW (ref >60)
Glucose, Bld: 124 mg/dL — ABNORMAL HIGH (ref 70–99)
Potassium: 3.9 mmol/L (ref 3.5–5.1)
Sodium: 133 mmol/L — ABNORMAL LOW (ref 135–145)
Total Bilirubin: 0.7 mg/dL (ref 0.3–1.2)
Total Protein: 8.5 g/dL — ABNORMAL HIGH (ref 6.5–8.1)

## 2022-07-17 LAB — RAPID URINE DRUG SCREEN, HOSP PERFORMED
Amphetamines: NOT DETECTED
Barbiturates: NOT DETECTED
Benzodiazepines: NOT DETECTED
Cocaine: NOT DETECTED
Opiates: NOT DETECTED
Tetrahydrocannabinol: POSITIVE — AB

## 2022-07-17 LAB — ETHANOL: Alcohol, Ethyl (B): <10 mg/dL (ref <10)

## 2022-07-17 MED ORDER — DIPHENHYDRAMINE HCL 50 MG/ML IJ SOLN
50.0000 mg | Freq: Once | INTRAMUSCULAR | Status: AC
  Administered 2022-07-17: 50 mg via INTRAVENOUS
  Filled 2022-07-17: qty 1

## 2022-07-17 NOTE — Discharge Instructions (Signed)
Your symptoms may be due to a side effect of drug/medication.  Please avoid using any recreational drugs and it may negatively affect her health.  Follow-up with your doctor for further care.  Return if you have any concern.

## 2022-07-17 NOTE — ED Provider Notes (Signed)
[place] [facility]ENC[facility] DEPARTMENT AT [facility] Provider Note   CSN: [id number] Arrival date & time: 07/17/22  4782     History  Chief Complaint  Patient presents with   Psychiatric Evaluation    Barnes Schonberger is a 37 y.o. male.  The history is provided by the patient and medical records. No language interpreter was used.     37 year old male significant history of end-stage renal disease currently on Monday Wednesday Friday dialysis, depression, asthma, anxiety, brought here via EMS from home with concerns of abnormal movement.  Initially, patient was heard yelling and screaming in the hallway.  I was able to calm patient down and obtain additional history.  Patient states that he feels anxious, and paranoid.  He is also having involuntary movement throughout his body.  It started this morning.  He admits that he feels sad and depressed about the passing of his father 2 years ago.  States he occasionally uses CBD gummy bears recreationally, did not tell me when was the last use.  Denies any recent drugs or alcohol use.  Denies any new medication changes.  Last dialyzed 2 days ago.  Denies any active pain at this time.  No report of any SI or HI.  History of however is a bit limited.  Home Medications Prior to Admission medications   Medication Sig Start Date End Date Taking? Authorizing Provider  acetaminophen (TYLENOL) 500 MG tablet Take 500-1,000 mg by mouth every 6 (six) hours as needed for moderate pain.    [provider]  amLODipine (NORVASC) 10 MG tablet Take 1 tablet (10 mg total) by mouth at bedtime. 12/09/21   Claiborne Rigg, NP  buPROPion (WELLBUTRIN XL) 150 MG 24 hr tablet Take 1 tablet (150 mg total) by mouth in the morning. 04/05/22   Marcine Matar, MD  calcitRIOL (ROCALTROL) 0.5 MCG capsule Take 0.5 mcg by mouth every Monday, Wednesday, and Friday. 02/02/22 02/01/23  [provider]  cloNIDine (CATAPRES) 0.1 MG tablet Take 1 tablet (0.1 mg  total) by mouth 2 (two) times daily. 04/05/22   Marcine Matar, MD  fluticasone (FLONASE) 50 MCG/ACT nasal spray Place 2 sprays into both nostrils daily. Patient taking differently: Place 2 sprays into both nostrils in the morning. 05/13/21   Waldon Merl, PA-C  hydrOXYzine (VISTARIL) 25 MG capsule Take 1 capsule (25 mg total) by mouth in the morning and at bedtime. 04/05/22   Marcine Matar, MD  losartan (COZAAR) 50 MG tablet Take 1 tablet (50 mg total) by mouth in the morning. 04/05/22   Marcine Matar, MD  MELATONIN PO Take 1-2 tablets by mouth at bedtime as needed (sleep).    [provider]  Methoxy PEG-Epoetin Beta (MIRCERA IJ) Mircera 02/02/22 02/01/23  [provider]  Metoprolol Tartrate 75 MG TABS TAKE 1 TABLET BY MOUTH TWICE DAILY 03/10/22 03/10/23  Marcine Matar, MD  Multiple Vitamin (MULTIVITAMIN WITH MINERALS) TABS tablet Take 1 tablet by mouth in the morning.    [provider]  Naphazoline HCl (CLEAR EYES OP) Place 1 drop into both eyes 2 (two) times daily as needed (dry/irritated eyes.).    [provider]  oxyCODONE-acetaminophen (PERCOCET/ROXICET) 5-325 MG tablet Take 1 tablet by mouth every 6 (six) hours as needed. 01/27/22   Baglia, Corrina, PA-C  sertraline (ZOLOFT) 100 MG tablet Take 1 tablet (100 mg total) by mouth in the morning. 04/05/22   Marcine Matar, MD  sildenafil (VIAGRA) 50 MG  tablet 1 tab PO 1/2 hr prior to sex PRN.  Limit use to 1 tab/24 hr 07/30/20   Marcine Matar, MD  sucroferric oxyhydroxide (VELPHORO) 500 MG chewable tablet Chew 500-1,000 mg by mouth See admin instructions. Take 2 tablets (1000 mg) by mouth 3 times daily with meals & take 1 tablet (500 mg) by mouth with snacks. 10/22/21   [provider]      Allergies    Patient has no known allergies.    Review of Systems   Review of Systems  All other systems reviewed and are negative.   Physical Exam Updated Vital Signs BP 127/85    Pulse (!) 123   Temp 98.6 F (37 C)   Resp (!) 21   SpO2 99%  Physical Exam Vitals and nursing note reviewed.  Constitutional:      General: He is in acute distress.     Appearance: He is well-developed.  HENT:     Head: Atraumatic.  Eyes:     Conjunctiva/sclera: Conjunctivae normal.  Cardiovascular:     Rate and Rhythm: Tachycardia present.     Pulses: Normal pulses.     Heart sounds: Normal heart sounds.  Pulmonary:     Effort: Pulmonary effort is normal.     Breath sounds: Normal breath sounds.  Abdominal:     Palpations: Abdomen is soft.  Musculoskeletal:     Cervical back: Neck supple.  Skin:    Findings: No rash.  Neurological:     Mental Status: He is alert and oriented to person, place, and time.     Comments: Abnormal body twitching but strength is equal to all 4 extremities.  Psychiatric:        Mood and Affect: Mood is anxious.        Speech: Speech normal.        Behavior: Behavior is hyperactive.        Thought Content: Thought content is paranoid. Thought content does not include homicidal or suicidal ideation.        Judgment: Judgment is inappropriate.     ED Results / Procedures / Treatments   Labs (all labs ordered are listed, but only abnormal results are displayed) Labs Reviewed  COMPREHENSIVE METABOLIC PANEL - Abnormal; Notable for the following components:      Result Value   Sodium 133 (*)    Chloride 91 (*)    CO2 19 (*)    Glucose, Bld 124 (*)    BUN 69 (*)    Creatinine, Ser 12.06 (*)    Total Protein 8.5 (*)    GFR, Estimated 5 (*)    Anion gap 23 (*)    All other components within normal limits  CBC WITH DIFFERENTIAL/PLATELET - Abnormal; Notable for the following components:   WBC 11.6 (*)    RBC 3.36 (*)    Hemoglobin 10.4 (*)    HCT 32.1 (*)    Neutro Abs 8.2 (*)    All other components within normal limits  ETHANOL  RAPID URINE DRUG SCREEN, HOSP PERFORMED    EKG None  Radiology No results  found.  Procedures Procedures    Medications Ordered in ED Medications  diphenhydrAMINE (BENADRYL) injection 50 mg (has no administration in time range)    ED Course/ Medical Decision Making/ A&P                             Medical Decision Making  Amount and/or Complexity of Data Reviewed Labs: ordered.  Risk Prescription drug management.   BP 127/85   Pulse (!) 123   Temp 98.6 F (37 C)   Resp (!) 21   SpO2 99%   75:40 AM  37 year old male significant history of end-stage renal disease currently on Monday Wednesday Friday dialysis, depression, asthma, anxiety, brought here via EMS from home with concerns of abnormal movement.  Initially, patient was heard yelling and screaming in the hallway.  I was able to calm patient down and obtain additional history.  Patient states that he feels anxious, and paranoid.  He is also having involuntary movement throughout his body.  It started this morning.  He admits that he feels sad and depressed about the passing of his father 2 years ago.  States he occasionally uses CBD gummy bears recreationally, did not tell me when was the last use.  Denies any recent drugs or alcohol use.  Denies any new medication changes.  Last dialyzed 2 days ago.  Denies any active pain at this time.  No report of any SI or HI.  History of however is a bit limited.  On initial exam patient was pacing around the hallway crying and screaming requiring redirection to get back to his room.  While sitting down he appears more calm but then exhibit some involuntary muscle twitching and body jerking without any seizure activity.  Heart with tachycardia, lungs are clear abdomen soft nontender, he has a Port-A-Cath to his left chest with normal appearance.  He has equal strength of fortunately sees and is alert and oriented x 4.  Lips are dry.  At this time I suspect this is likely negative side effects for drugs. suspect coming from CBD gummy bear.  With his dyskinesia,  will give Benadryl and will monitor closely.  -Labs ordered, independently viewed and interpreted by me.  Labs remarkable for impaired renal function, near baseline.  Anion gap of 23, but due to hx of ESRD I will avoid fluid infusion.   -The patient was maintained on a cardiac monitor.  I personally viewed and interpreted the cardiac monitored which showed an underlying rhythm of: Sinus tachycardia -Imaging including head CT considered but not performed.   -This patient presents to the ED for concern of AMS, this involves an extensive number of treatment options, and is a complaint that carries with it a high risk of complications and morbidity.  The differential diagnosis includes drug induced psychosis, electrolytes derangement, psych, dissection, PE, ACS, Stroke, medication side effect -Co morbidities that complicate the patient evaluation includes depression, ESRD -Treatment includes benadryl -Reevaluation of the patient after these medicines showed that the patient improved -PCP office notes or outside notes reviewed -Discussion with specialist attending Dr. Suezanne Jacquet -Escalation to admission/observation considered: patients feels much better, is comfortable with discharge, and will follow up with PCP -Prescription medication considered, patient comfortable with OTC meds -Social Determinant of Health considered which includes drug use  12:47 PM Patient has been monitored in the ED for the past 5 hours with steady improvement of his mental status.  Currently he is resting comfortably appears to be in no acute discomfort.  Suspect drug-induced psychosis with some component of dyskinesia improved with Benadryl.  Encourage patient to avoid any substance use but otherwise he is stable to be discharged home.  Return precaution given.         Final Clinical Impression(s) / ED Diagnoses Final diagnoses:  Transient alteration of awareness  Rx / DC Orders ED Discharge Orders     None          Fayrene Helper, PA-C 07/17/22 1248    Lonell Grandchild, MD 07/17/22 1350

## 2022-07-17 NOTE — ED Notes (Signed)
Patient's restlessness and agitation has improved significantly. Pt was able to stand and use a urinal without assistance.

## 2022-07-17 NOTE — ED Triage Notes (Addendum)
Pt arrives via EMS from home. Family called EMS due to patient waking up with involuntary movement in his extremities and restlessness. Pt is AxOx4. Pt had a full session of dialysis on Wednesday. Pt denies SI or HI. Does appear to be guarded with his responses, denies complaints otherwise

## 2022-07-17 NOTE — ED Notes (Addendum)
Pt had an outburst. He became really agitated, got out of bed and attempted to run out of the room saying he wanted his son. Pt did trip on the door and landed on the floor. No injury obtained. Pt was redirected by staff and is now currently sitting in bed, cooperative, and talking with the PA

## 2022-07-20 DIAGNOSIS — D689 Coagulation defect, unspecified: Secondary | ICD-10-CM | POA: Diagnosis not present

## 2022-07-20 DIAGNOSIS — N2581 Secondary hyperparathyroidism of renal origin: Secondary | ICD-10-CM | POA: Diagnosis not present

## 2022-07-20 DIAGNOSIS — Z4800 Encounter for change or removal of nonsurgical wound dressing: Secondary | ICD-10-CM | POA: Diagnosis not present

## 2022-07-20 DIAGNOSIS — Z992 Dependence on renal dialysis: Secondary | ICD-10-CM | POA: Diagnosis not present

## 2022-07-20 DIAGNOSIS — N186 End stage renal disease: Secondary | ICD-10-CM | POA: Diagnosis not present

## 2022-07-20 DIAGNOSIS — D631 Anemia in chronic kidney disease: Secondary | ICD-10-CM | POA: Diagnosis not present

## 2022-07-22 DIAGNOSIS — Z4800 Encounter for change or removal of nonsurgical wound dressing: Secondary | ICD-10-CM | POA: Diagnosis not present

## 2022-07-22 DIAGNOSIS — Z992 Dependence on renal dialysis: Secondary | ICD-10-CM | POA: Diagnosis not present

## 2022-07-22 DIAGNOSIS — N2581 Secondary hyperparathyroidism of renal origin: Secondary | ICD-10-CM | POA: Diagnosis not present

## 2022-07-22 DIAGNOSIS — D689 Coagulation defect, unspecified: Secondary | ICD-10-CM | POA: Diagnosis not present

## 2022-07-22 DIAGNOSIS — D631 Anemia in chronic kidney disease: Secondary | ICD-10-CM | POA: Diagnosis not present

## 2022-07-22 DIAGNOSIS — N186 End stage renal disease: Secondary | ICD-10-CM | POA: Diagnosis not present

## 2022-07-24 DIAGNOSIS — Z4800 Encounter for change or removal of nonsurgical wound dressing: Secondary | ICD-10-CM | POA: Diagnosis not present

## 2022-07-24 DIAGNOSIS — N2581 Secondary hyperparathyroidism of renal origin: Secondary | ICD-10-CM | POA: Diagnosis not present

## 2022-07-24 DIAGNOSIS — N186 End stage renal disease: Secondary | ICD-10-CM | POA: Diagnosis not present

## 2022-07-24 DIAGNOSIS — D631 Anemia in chronic kidney disease: Secondary | ICD-10-CM | POA: Diagnosis not present

## 2022-07-24 DIAGNOSIS — Z992 Dependence on renal dialysis: Secondary | ICD-10-CM | POA: Diagnosis not present

## 2022-07-24 DIAGNOSIS — D689 Coagulation defect, unspecified: Secondary | ICD-10-CM | POA: Diagnosis not present

## 2022-07-27 DIAGNOSIS — D631 Anemia in chronic kidney disease: Secondary | ICD-10-CM | POA: Diagnosis not present

## 2022-07-27 DIAGNOSIS — D689 Coagulation defect, unspecified: Secondary | ICD-10-CM | POA: Diagnosis not present

## 2022-07-27 DIAGNOSIS — Z992 Dependence on renal dialysis: Secondary | ICD-10-CM | POA: Diagnosis not present

## 2022-07-27 DIAGNOSIS — N186 End stage renal disease: Secondary | ICD-10-CM | POA: Diagnosis not present

## 2022-07-27 DIAGNOSIS — N2581 Secondary hyperparathyroidism of renal origin: Secondary | ICD-10-CM | POA: Diagnosis not present

## 2022-07-27 DIAGNOSIS — Z4800 Encounter for change or removal of nonsurgical wound dressing: Secondary | ICD-10-CM | POA: Diagnosis not present

## 2022-07-29 DIAGNOSIS — N186 End stage renal disease: Secondary | ICD-10-CM | POA: Diagnosis not present

## 2022-07-29 DIAGNOSIS — Z4800 Encounter for change or removal of nonsurgical wound dressing: Secondary | ICD-10-CM | POA: Diagnosis not present

## 2022-07-29 DIAGNOSIS — D631 Anemia in chronic kidney disease: Secondary | ICD-10-CM | POA: Diagnosis not present

## 2022-07-29 DIAGNOSIS — D689 Coagulation defect, unspecified: Secondary | ICD-10-CM | POA: Diagnosis not present

## 2022-07-29 DIAGNOSIS — Z992 Dependence on renal dialysis: Secondary | ICD-10-CM | POA: Diagnosis not present

## 2022-07-29 DIAGNOSIS — N2581 Secondary hyperparathyroidism of renal origin: Secondary | ICD-10-CM | POA: Diagnosis not present

## 2022-07-31 DIAGNOSIS — D631 Anemia in chronic kidney disease: Secondary | ICD-10-CM | POA: Diagnosis not present

## 2022-07-31 DIAGNOSIS — N186 End stage renal disease: Secondary | ICD-10-CM | POA: Diagnosis not present

## 2022-07-31 DIAGNOSIS — D689 Coagulation defect, unspecified: Secondary | ICD-10-CM | POA: Diagnosis not present

## 2022-07-31 DIAGNOSIS — Z4800 Encounter for change or removal of nonsurgical wound dressing: Secondary | ICD-10-CM | POA: Diagnosis not present

## 2022-07-31 DIAGNOSIS — N2581 Secondary hyperparathyroidism of renal origin: Secondary | ICD-10-CM | POA: Diagnosis not present

## 2022-07-31 DIAGNOSIS — Z992 Dependence on renal dialysis: Secondary | ICD-10-CM | POA: Diagnosis not present

## 2022-08-01 ENCOUNTER — Emergency Department (HOSPITAL_COMMUNITY)
Admission: EM | Admit: 2022-08-01 | Discharge: 2022-08-01 | Disposition: A | Discharge status: Home / Self Care | Payer: 59 | Source: Home / Self Care | Attending: Emergency Medicine | Admitting: Emergency Medicine

## 2022-08-01 ENCOUNTER — Other Ambulatory Visit: Payer: Self-pay

## 2022-08-01 ENCOUNTER — Encounter (HOSPITAL_COMMUNITY): Payer: Self-pay

## 2022-08-01 ENCOUNTER — Emergency Department (HOSPITAL_COMMUNITY): Payer: 59

## 2022-08-01 DIAGNOSIS — D631 Anemia in chronic kidney disease: Secondary | ICD-10-CM | POA: Diagnosis not present

## 2022-08-01 DIAGNOSIS — I120 Hypertensive chronic kidney disease with stage 5 chronic kidney disease or end stage renal disease: Secondary | ICD-10-CM | POA: Diagnosis not present

## 2022-08-01 DIAGNOSIS — R251 Tremor, unspecified: Secondary | ICD-10-CM | POA: Insufficient documentation

## 2022-08-01 DIAGNOSIS — I959 Hypotension, unspecified: Secondary | ICD-10-CM | POA: Insufficient documentation

## 2022-08-01 DIAGNOSIS — Z992 Dependence on renal dialysis: Secondary | ICD-10-CM

## 2022-08-01 DIAGNOSIS — Z79899 Other long term (current) drug therapy: Secondary | ICD-10-CM | POA: Diagnosis not present

## 2022-08-01 DIAGNOSIS — Z8249 Family history of ischemic heart disease and other diseases of the circulatory system: Secondary | ICD-10-CM | POA: Diagnosis not present

## 2022-08-01 DIAGNOSIS — T40711A Poisoning by cannabis, accidental (unintentional), initial encounter: Secondary | ICD-10-CM | POA: Diagnosis not present

## 2022-08-01 DIAGNOSIS — G253 Myoclonus: Secondary | ICD-10-CM | POA: Diagnosis not present

## 2022-08-01 DIAGNOSIS — R569 Unspecified convulsions: Secondary | ICD-10-CM | POA: Diagnosis not present

## 2022-08-01 DIAGNOSIS — N186 End stage renal disease: Secondary | ICD-10-CM | POA: Insufficient documentation

## 2022-08-01 LAB — CBC
HCT: 31.1 % — ABNORMAL LOW (ref 39.0–52.0)
Hemoglobin: 9.9 g/dL — ABNORMAL LOW (ref 13.0–17.0)
MCH: 30.7 pg (ref 26.0–34.0)
MCHC: 31.8 g/dL (ref 30.0–36.0)
MCV: 96.3 fL (ref 80.0–100.0)
Platelets: 237 10*3/uL (ref 150–400)
RBC: 3.23 MIL/uL — ABNORMAL LOW (ref 4.22–5.81)
RDW: 16 % — ABNORMAL HIGH (ref 11.5–15.5)
WBC: 6 10*3/uL (ref 4.0–10.5)
nRBC: 0 % (ref 0.0–0.2)

## 2022-08-01 LAB — ETHANOL: Alcohol, Ethyl (B): <10 mg/dL (ref <10)

## 2022-08-01 LAB — I-STAT CHEM 8, ED
BUN: 34 mg/dL — ABNORMAL HIGH (ref 6–20)
Calcium, Ion: 1.06 mmol/L — ABNORMAL LOW (ref 1.15–1.40)
Chloride: 97 mmol/L — ABNORMAL LOW (ref 98–111)
Creatinine, Ser: 10 mg/dL — ABNORMAL HIGH (ref 0.61–1.24)
Glucose, Bld: 86 mg/dL (ref 70–99)
HCT: 33 % — ABNORMAL LOW (ref 39.0–52.0)
Hemoglobin: 11.2 g/dL — ABNORMAL LOW (ref 13.0–17.0)
Potassium: 4.3 mmol/L (ref 3.5–5.1)
Sodium: 135 mmol/L (ref 135–145)
TCO2: 30 mmol/L (ref 22–32)

## 2022-08-01 LAB — LACTIC ACID, PLASMA: Lactic Acid, Venous: 1.1 mmol/L (ref 0.5–1.9)

## 2022-08-01 LAB — COMPREHENSIVE METABOLIC PANEL
ALT: 12 U/L (ref 0–44)
AST: 14 U/L — ABNORMAL LOW (ref 15–41)
Albumin: 4.3 g/dL (ref 3.5–5.0)
Alkaline Phosphatase: 94 U/L (ref 38–126)
Anion gap: 16 — ABNORMAL HIGH (ref 5–15)
BUN: 27 mg/dL — ABNORMAL HIGH (ref 6–20)
CO2: 25 mmol/L (ref 22–32)
Calcium: 9.4 mg/dL (ref 8.9–10.3)
Chloride: 93 mmol/L — ABNORMAL LOW (ref 98–111)
Creatinine, Ser: 9.29 mg/dL — ABNORMAL HIGH (ref 0.61–1.24)
GFR, Estimated: 7 mL/min — ABNORMAL LOW (ref >60)
Glucose, Bld: 89 mg/dL (ref 70–99)
Potassium: 4.2 mmol/L (ref 3.5–5.1)
Sodium: 134 mmol/L — ABNORMAL LOW (ref 135–145)
Total Bilirubin: 0.4 mg/dL (ref 0.3–1.2)
Total Protein: 8.1 g/dL (ref 6.5–8.1)

## 2022-08-01 LAB — CBG MONITORING, ED
Glucose-Capillary: 76 mg/dL (ref 70–99)
Glucose-Capillary: 74 mg/dL (ref 70–99)

## 2022-08-01 LAB — ACETAMINOPHEN LEVEL: Acetaminophen (Tylenol), Serum: <10 ug/mL — ABNORMAL LOW (ref 10–30)

## 2022-08-01 LAB — SALICYLATE LEVEL: Salicylate Lvl: <7 mg/dL — ABNORMAL LOW (ref 7.0–30.0)

## 2022-08-01 MED ORDER — LACTATED RINGERS IV BOLUS
500.0000 mL | Freq: Once | INTRAVENOUS | Status: AC
  Administered 2022-08-01: 500 mL via INTRAVENOUS

## 2022-08-01 MED ORDER — SODIUM CHLORIDE 0.9 % IV BOLUS
500.0000 mL | Freq: Once | INTRAVENOUS | Status: AC
  Administered 2022-08-01: 500 mL via INTRAVENOUS

## 2022-08-01 NOTE — ED Notes (Signed)
Pt resting at this time. Family at bedside

## 2022-08-01 NOTE — ED Notes (Signed)
Pt was stuck 3 different times. Wasn't successful.

## 2022-08-01 NOTE — ED Notes (Addendum)
Pt has noticeably less movement of his feet compared to earlier. Resting comfortably. Family at bedside

## 2022-08-01 NOTE — ED Notes (Signed)
Pt stuck additional two times, unsuccessful,  unable to collect blood cultures.

## 2022-08-01 NOTE — ED Notes (Signed)
PT having head movements from side to side and rapid eye movement. Pt is responsive.

## 2022-08-01 NOTE — ED Notes (Signed)
PT states his mouth is dry. Pt was able to drink a few sips of water.

## 2022-08-01 NOTE — ED Provider Notes (Signed)
[facility] Provider Note   CSN: [id number] Arrival date & time: 08/01/22  4098     History  Chief Complaint  Patient presents with   Tremors   Weakness    Samuel Burns is a 37 y.o. male.  Patient with hx esrd/hd m/w/f, indicates went to HD yesterday per normal routine, no complications. Today pt calm EMS as he felt shaky/tremulous. Pt notes hx similar symptoms, denies hx seizures. No tc seizure activity or post-ictal type confusion noted. Pt denies trauma/fall. No headache. No neck or back pain. No chest pain or sob. Denies fever or chills. Indicates has been eating/drinking per normal. Notes from recent dialysis indicate bp has been low, and taking of weaning off clonidine and amlodipine. Pt denies taking bp meds this AM.  EMS noted bp 100/48, and did give versed 5 mg, cbg 72.  The history is provided by the patient, medical records and the EMS personnel.  Seizures      Home Medications Prior to Admission medications   Medication Sig Start Date End Date Taking? Authorizing Provider  acetaminophen (TYLENOL) 500 MG tablet Take 500-1,000 mg by mouth every 6 (six) hours as needed for moderate pain.    [provider]  amLODipine (NORVASC) 10 MG tablet Take 1 tablet (10 mg total) by mouth at bedtime. 12/09/21   Claiborne Rigg, NP  buPROPion (WELLBUTRIN XL) 150 MG 24 hr tablet Take 1 tablet (150 mg total) by mouth in the morning. 04/05/22   Marcine Matar, MD  calcitRIOL (ROCALTROL) 0.5 MCG capsule Take 0.5 mcg by mouth every Monday, Wednesday, and Friday. 02/02/22 02/01/23  [provider]  cloNIDine (CATAPRES) 0.1 MG tablet Take 1 tablet (0.1 mg total) by mouth 2 (two) times daily. 04/05/22   Marcine Matar, MD  fluticasone (FLONASE) 50 MCG/ACT nasal spray Place 2 sprays into both nostrils daily. Patient taking differently: Place 2 sprays into both nostrils in the morning. 05/13/21   Waldon Merl, PA-C   hydrOXYzine (VISTARIL) 25 MG capsule Take 1 capsule (25 mg total) by mouth in the morning and at bedtime. 04/05/22   Marcine Matar, MD  losartan (COZAAR) 50 MG tablet Take 1 tablet (50 mg total) by mouth in the morning. 04/05/22   Marcine Matar, MD  MELATONIN PO Take 1-2 tablets by mouth at bedtime as needed (sleep).    [provider]  Methoxy PEG-Epoetin Beta (MIRCERA IJ) Mircera 02/02/22 02/01/23  [provider]  Metoprolol Tartrate 75 MG TABS TAKE 1 TABLET BY MOUTH TWICE DAILY 03/10/22 03/10/23  Marcine Matar, MD  Multiple Vitamin (MULTIVITAMIN WITH MINERALS) TABS tablet Take 1 tablet by mouth in the morning.    [provider]  Naphazoline HCl (CLEAR EYES OP) Place 1 drop into both eyes 2 (two) times daily as needed (dry/irritated eyes.).    [provider]  oxyCODONE-acetaminophen (PERCOCET/ROXICET) 5-325 MG tablet Take 1 tablet by mouth every 6 (six) hours as needed. 01/27/22   Baglia, Corrina, PA-C  sertraline (ZOLOFT) 100 MG tablet Take 1 tablet (100 mg total) by mouth in the morning. 04/05/22   Marcine Matar, MD  sildenafil (VIAGRA) 50 MG tablet 1 tab PO 1/2 hr prior to sex PRN.  Limit use to 1 tab/24 hr 07/30/20   Marcine Matar, MD  sucroferric oxyhydroxide (VELPHORO) 500 MG chewable tablet Chew 500-1,000 mg by mouth See admin instructions. Take 2 tablets (1000 mg) by mouth 3 times daily  with meals & take 1 tablet (500 mg) by mouth with snacks. 10/22/21   [provider]      Allergies    Patient has no known allergies.    Review of Systems   Review of Systems  Constitutional:  Negative for chills, diaphoresis and fever.  HENT:  Negative for sore throat.   Eyes:  Negative for redness and visual disturbance.  Respiratory:  Negative for cough and shortness of breath.   Cardiovascular:  Negative for chest pain and leg swelling.  Gastrointestinal:  Negative for abdominal pain, blood in stool, diarrhea and vomiting.   Genitourinary:  Negative for dysuria and flank pain.  Musculoskeletal:  Negative for back pain and neck pain.  Skin:  Negative for rash.  Neurological:  Positive for weakness. Negative for numbness and headaches.  Hematological:  Does not bruise/bleed easily.  Psychiatric/Behavioral:  Negative for confusion.     Physical Exam Updated Vital Signs BP 99/62   Pulse (!) 57   Temp (!) 97.4 F (36.3 C) (Axillary)   Resp 18   Ht 1.727 m (5\' 8" )   Wt 81.6 kg   SpO2 97%   BMI 27.37 kg/m  Physical Exam Vitals and nursing note reviewed.  Constitutional:      Appearance: Normal appearance. He is well-developed.  HENT:     Head: Atraumatic.     Comments: No sinus or temporal tenderness.     Nose: Nose normal.     Mouth/Throat:     Mouth: Mucous membranes are moist.     Pharynx: Oropharynx is clear.  Eyes:     General: No scleral icterus.    Extraocular Movements: Extraocular movements intact.     Conjunctiva/sclera: Conjunctivae normal.     Pupils: Pupils are equal, round, and reactive to light.  Neck:     Vascular: No carotid bruit.     Trachea: No tracheal deviation.     Comments: No stiffness or rigidity.  Cardiovascular:     Rate and Rhythm: Normal rate and regular rhythm.     Pulses: Normal pulses.     Heart sounds: Normal heart sounds. No murmur heard.    No friction rub. No gallop.  Pulmonary:     Effort: Pulmonary effort is normal. No accessory muscle usage or respiratory distress.     Breath sounds: Normal breath sounds.     Comments: HD cath left chest without sign of infection to area.  Abdominal:     General: Bowel sounds are normal. There is no distension.     Palpations: Abdomen is soft. There is no mass.     Tenderness: There is no abdominal tenderness. There is no guarding.  Genitourinary:    Comments: No cva tenderness. Greenish/browns stool, heme neg.  Musculoskeletal:        General: No swelling or tenderness.     Cervical back: Normal range of motion  and neck supple. No rigidity or tenderness.     Right lower leg: No edema.     Left lower leg: No edema.     Comments: CTLS spine, non tender, aligned, no step off. Good rom bil extremities without pain or focal bony tenderness.   Skin:    General: Skin is warm and dry.     Findings: No rash.  Neurological:     Mental Status: He is alert.     Comments: Alert, speech clear. Motor/sens grossly intact bil. Stre 5/5 bil. Sens intact.   Psychiatric:  Mood and Affect: Mood normal.     ED Results / Procedures / Treatments   Labs (all labs ordered are listed, but only abnormal results are displayed) Results for orders placed or performed during the hospital encounter of 08/01/22  Comprehensive metabolic panel  Result Value Ref Range   Sodium 134 (L) 135 - 145 mmol/L   Potassium 4.2 3.5 - 5.1 mmol/L   Chloride 93 (L) 98 - 111 mmol/L   CO2 25 22 - 32 mmol/L   Glucose, Bld 89 70 - 99 mg/dL   BUN 27 (H) 6 - 20 mg/dL   Creatinine, Ser 1.61 (H) 0.61 - 1.24 mg/dL   Calcium 9.4 8.9 - 09.6 mg/dL   Total Protein 8.1 6.5 - 8.1 g/dL   Albumin 4.3 3.5 - 5.0 g/dL   AST 14 (L) 15 - 41 U/L   ALT 12 0 - 44 U/L   Alkaline Phosphatase 94 38 - 126 U/L   Total Bilirubin 0.4 0.3 - 1.2 mg/dL   GFR, Estimated 7 (L) >60 mL/min   Anion gap 16 (H) 5 - 15  CBC  Result Value Ref Range   WBC 6.0 4.0 - 10.5 K/uL   RBC 3.23 (L) 4.22 - 5.81 MIL/uL   Hemoglobin 9.9 (L) 13.0 - 17.0 g/dL   HCT 04.5 (L) 40.9 - 81.1 %   MCV 96.3 80.0 - 100.0 fL   MCH 30.7 26.0 - 34.0 pg   MCHC 31.8 30.0 - 36.0 g/dL   RDW 91.4 (H) 78.2 - 95.6 %   Platelets 237 150 - 400 K/uL   nRBC 0.0 0.0 - 0.2 %  Acetaminophen level  Result Value Ref Range   Acetaminophen (Tylenol), Serum <10 (L) 10 - 30 ug/mL  Salicylate level  Result Value Ref Range   Salicylate Lvl <7.0 (L) 7.0 - 30.0 mg/dL  Ethanol  Result Value Ref Range   Alcohol, Ethyl (B) <10 <10 mg/dL  Lactic acid, plasma  Result Value Ref Range   Lactic Acid, Venous  1.1 0.5 - 1.9 mmol/L  CBG monitoring, ED  Result Value Ref Range   Glucose-Capillary 76 70 - 99 mg/dL  I-stat chem 8, ED  Result Value Ref Range   Sodium 135 135 - 145 mmol/L   Potassium 4.3 3.5 - 5.1 mmol/L   Chloride 97 (L) 98 - 111 mmol/L   BUN 34 (H) 6 - 20 mg/dL   Creatinine, Ser 21.30 (H) 0.61 - 1.24 mg/dL   Glucose, Bld 86 70 - 99 mg/dL   Calcium, Ion 8.65 (L) 1.15 - 1.40 mmol/L   TCO2 30 22 - 32 mmol/L   Hemoglobin 11.2 (L) 13.0 - 17.0 g/dL   HCT 78.4 (L) 69.6 - 29.5 %  CBG monitoring, ED  Result Value Ref Range   Glucose-Capillary 74 70 - 99 mg/dL       EKG EKG Interpretation  Date/Time:  Saturday August 01 2022 07:51:08 EDT Ventricular Rate:  90 PR Interval:  171 QRS Duration: 96 QT Interval:  378 QTC Calculation: 463 R Axis:   48 Text Interpretation: Sinus rhythm Non-specific ST-t changes Confirmed by Cathren Laine ([id number]) on 08/01/2022 8:11:00 AM  Radiology CT Head Wo Contrast  Result Date: 08/01/2022 CLINICAL DATA:  Mental status changes. EXAM: CT HEAD WITHOUT CONTRAST TECHNIQUE: Contiguous axial images were obtained from the base of the skull through the vertex without intravenous contrast. RADIATION DOSE REDUCTION: This exam was performed according to the departmental dose-optimization program which includes automated exposure control, adjustment  of the mA and/or kV according to patient size and/or use of iterative reconstruction technique. COMPARISON:  None Available. FINDINGS: Brain: No evidence of acute infarction, hemorrhage, hydrocephalus, extra-axial collection or mass lesion/mass effect. Vascular: No hyperdense vessel or unexpected calcification. Skull: Normal. Negative for fracture or focal lesion. Sinuses/Orbits: Opacification of the right frontal sinus. Remainder the paranasal sinuses well aerated. Mastoid air cells unremarkable. Orbits unremarkable. Other: None. IMPRESSION: 1. No acute intracranial abnormality. 2. Right frontal sinus disease.  Electronically Signed   By: Annia Belt M.D.   On: 08/01/2022 09:21    Procedures Procedures    Medications Ordered in ED Medications  sodium chloride 0.9 % bolus 500 mL (0 mLs Intravenous Stopped 08/01/22 0816)  sodium chloride 0.9 % bolus 500 mL (0 mLs Intravenous Stopped 08/01/22 0917)  lactated ringers bolus 500 mL (0 mLs Intravenous Stopped 08/01/22 1222)    ED Course/ Medical Decision Making/ A&P                             Medical Decision Making Problems Addressed: ESRD (end stage renal disease) on dialysis [facility]): chronic illness or injury with exacerbation, progression, or side effects of treatment that poses a threat to life or bodily functions Hypotension, unspecified hypotension type: acute illness or injury with systemic symptoms that poses a threat to life or bodily functions Tremulousness: acute illness or injury  Amount and/or Complexity of Data Reviewed Independent Historian: EMS    Details: Ems/family, hx External Data Reviewed: notes. Labs: ordered. Decision-making details documented in ED Course. Radiology: ordered and independent interpretation performed. Decision-making details documented in ED Course. ECG/medicine tests: ordered and independent interpretation performed. Decision-making details documented in ED Course.  Risk Prescription drug management. Decision regarding hospitalization.   Iv ns. Continuous pulse ox and cardiac monitoring. Labs ordered/sent.  Differential diagnosis includes  . Dispo decision including potential need for admission considered - will get labs and reassess.   Reviewed nursing notes and prior charts for additional history. External reports reviewed. Additional history from: EMS.   Recent altered mental status ?shaking episodes. Will get imaging.   Bp low. Ns bolus. Pt denies fever, chills or sweats.   Cardiac monitor: sinus rhythm, rate 74.  Labs reviewed/interpreted by me - wbc normal. Hgb 10, c/w prior. K and glucose  normal. Pt indicates does not make urine at baseline.   CT reviewed/interpreted by me - no hem.   NS bolus. Po fluids/food.   Notes in EPIC indicate recent low bp at HD, and plan to taper off bp meds. Pt indicates he remembers a discussion about that, but has been taking his normal bp meds as prescribed, including last night.   EMS bp was 100/ which is similar to patients recent baseline. I placed new cuff/adjusted cuff, and repeat bp 95/55. Pt denies faintness or dizziness. On review bps, during recent hd and prior ed visits, noted to have bp in 90-100/ range previously. Will have hold bp meds, and discuss plan/meds with his doctor in the next 1-2 days.   Bp improved w ivfs. Pt mentating normally. Is alert, oriented. Denies current pain or other c/o - no headache, no chest pain or sob, no abd pain or nvd, no sinus pain or drainage. No fever or chills.   Additional labs reviewed/interpreted by me - lactate normal.   Hct is 31, mildly low than prior. Pt denies blood loss. No melena. States stools greenish in color, similar to his  baseline.  Stool is greenish-brown on rectal exam, and hemoccult negative.   BP is 99/62, c/w recent baseline. No faintness. No sob. No tremors or shaking noted.   Pt currently appears stable for d/c. For now, will have hold bp meds, and follow up closely with pcp/renal in two days time.  For recent tremulousness/feeling shaky, will also refer to close neurology follow up.  Return precautions provided.  CRITICAL CARE RE: hypotension, ESRD/HD.  Performed by: Suzi Roots Total critical care time: 40 minutes Critical care time was exclusive of separately billable procedures and treating other patients. Critical care was necessary to treat or prevent imminent or life-threatening deterioration. Critical care was time spent personally by me on the following activities: development of treatment plan with patient and/or surrogate as well as nursing, discussions with  consultants, evaluation of patient's response to treatment, examination of patient, obtaining history from patient or surrogate, ordering and performing treatments and interventions, ordering and review of laboratory studies, ordering and review of radiographic studies, pulse oximetry and re-evaluation of patient's condition.         Final Clinical Impression(s) / ED Diagnoses Final diagnoses:  Hypotension, unspecified hypotension type  Tremulousness  ESRD (end stage renal disease) on dialysis [facility])    Rx / DC Orders ED Discharge Orders     None         Cathren Laine, MD 08/01/22 1423

## 2022-08-01 NOTE — ED Notes (Signed)
Pt minimally arousalable, MD made aware.

## 2022-08-01 NOTE — ED Notes (Signed)
Patient transported to CT with this paramedic

## 2022-08-01 NOTE — ED Notes (Addendum)
Pt BP dropped to 56/31 during CT. Pt was responsive and verbal. Pt placed in trendelenburg position.

## 2022-08-01 NOTE — ED Notes (Signed)
Pt having rapid eye movement from left to right and legs tensing up to chest. Pt able to say this is a seizure.

## 2022-08-01 NOTE — ED Notes (Signed)
Pt continues to have legs pulling up to chest but is responsive with clear speech.

## 2022-08-01 NOTE — ED Notes (Signed)
Pt having rapid eye movement from left to right and unresponsive. MD notified.

## 2022-08-01 NOTE — ED Triage Notes (Addendum)
Pt BIBGEMS after having possible seizures. Pt called out for uncontrollable twitching, then with EMS pt had possible seizure. pt awake throughout the entire time but whole body convulses. Seen here one week ago for the same, no resolution.  No hx of seizures  5 mg Midazolam   HD MWF  100/48  92 hr 72 CBG

## 2022-08-01 NOTE — ED Notes (Signed)
Pt resting at this time.

## 2022-08-01 NOTE — ED Notes (Signed)
MD at bedside for patient evaluation.

## 2022-08-01 NOTE — Discharge Instructions (Addendum)
It was our pleasure to provide your ER care today - we hope that you feel better.  Make sure to drink plenty of fluids/stay well hydrated. Get adequate nutrition.   In looking at your recent dialysis notes, and with your blood pressure being low today - hold/do not take your blood pressure medication for now until you follow up with your doctor this week. Follow up closely with your doctor this Monday for recheck and recheck of blood pressure - discuss plan with your blood pressure medications then.   For recent tremulousness/shakiness, follow up with neurologist in the next 1-2 weeks - call office to arrange appointment.   Return to ER right away if worse, new symptoms, fevers, chest pain, trouble breathing, weak/fainting, or other concern.    You were given meds by EMS that cause drowsiness - no driving for the next 6 hours.

## 2022-08-02 ENCOUNTER — Encounter (HOSPITAL_COMMUNITY): Payer: Self-pay | Admitting: Internal Medicine

## 2022-08-02 ENCOUNTER — Inpatient Hospital Stay (HOSPITAL_COMMUNITY): Payer: 59

## 2022-08-02 ENCOUNTER — Inpatient Hospital Stay (HOSPITAL_COMMUNITY)
Admission: EM | Admit: 2022-08-02 | Discharge: 2022-08-04 | DRG: 917 | Disposition: A | Discharge status: Home / Self Care | Payer: 59 | Attending: Internal Medicine | Admitting: Internal Medicine

## 2022-08-02 DIAGNOSIS — Z8249 Family history of ischemic heart disease and other diseases of the circulatory system: Secondary | ICD-10-CM | POA: Diagnosis not present

## 2022-08-02 DIAGNOSIS — G253 Myoclonus: Secondary | ICD-10-CM | POA: Diagnosis present

## 2022-08-02 DIAGNOSIS — F320 Major depressive disorder, single episode, mild: Secondary | ICD-10-CM | POA: Diagnosis present

## 2022-08-02 DIAGNOSIS — F419 Anxiety disorder, unspecified: Secondary | ICD-10-CM | POA: Diagnosis present

## 2022-08-02 DIAGNOSIS — Z79899 Other long term (current) drug therapy: Secondary | ICD-10-CM

## 2022-08-02 DIAGNOSIS — Y92009 Unspecified place in unspecified non-institutional (private) residence as the place of occurrence of the external cause: Secondary | ICD-10-CM

## 2022-08-02 DIAGNOSIS — Z7151 Drug abuse counseling and surveillance of drug abuser: Secondary | ICD-10-CM

## 2022-08-02 DIAGNOSIS — D638 Anemia in other chronic diseases classified elsewhere: Secondary | ICD-10-CM | POA: Diagnosis present

## 2022-08-02 DIAGNOSIS — I959 Hypotension, unspecified: Secondary | ICD-10-CM | POA: Diagnosis present

## 2022-08-02 DIAGNOSIS — I120 Hypertensive chronic kidney disease with stage 5 chronic kidney disease or end stage renal disease: Secondary | ICD-10-CM | POA: Diagnosis present

## 2022-08-02 DIAGNOSIS — D631 Anemia in chronic kidney disease: Secondary | ICD-10-CM | POA: Diagnosis not present

## 2022-08-02 DIAGNOSIS — R569 Unspecified convulsions: Secondary | ICD-10-CM | POA: Diagnosis not present

## 2022-08-02 DIAGNOSIS — R259 Unspecified abnormal involuntary movements: Secondary | ICD-10-CM | POA: Diagnosis not present

## 2022-08-02 DIAGNOSIS — Z992 Dependence on renal dialysis: Secondary | ICD-10-CM

## 2022-08-02 DIAGNOSIS — T40711A Poisoning by cannabis, accidental (unintentional), initial encounter: Secondary | ICD-10-CM | POA: Diagnosis present

## 2022-08-02 DIAGNOSIS — N186 End stage renal disease: Secondary | ICD-10-CM | POA: Diagnosis present

## 2022-08-02 DIAGNOSIS — F909 Attention-deficit hyperactivity disorder, unspecified type: Secondary | ICD-10-CM | POA: Diagnosis present

## 2022-08-02 DIAGNOSIS — N250 Renal osteodystrophy: Secondary | ICD-10-CM | POA: Diagnosis not present

## 2022-08-02 DIAGNOSIS — I10 Essential (primary) hypertension: Secondary | ICD-10-CM

## 2022-08-02 DIAGNOSIS — G35 Multiple sclerosis: Secondary | ICD-10-CM | POA: Diagnosis not present

## 2022-08-02 LAB — CBC WITH DIFFERENTIAL/PLATELET
Abs Immature Granulocytes: 0.03 10*3/uL (ref 0.00–0.07)
Basophils Absolute: 0.1 10*3/uL (ref 0.0–0.1)
Basophils Relative: 1 %
Eosinophils Absolute: 0.3 10*3/uL (ref 0.0–0.5)
Eosinophils Relative: 3 %
HCT: 29.2 % — ABNORMAL LOW (ref 39.0–52.0)
Hemoglobin: 9.7 g/dL — ABNORMAL LOW (ref 13.0–17.0)
Immature Granulocytes: 0 %
Lymphocytes Relative: 22 %
Lymphs Abs: 1.9 10*3/uL (ref 0.7–4.0)
MCH: 32.4 pg (ref 26.0–34.0)
MCHC: 33.2 g/dL (ref 30.0–36.0)
MCV: 97.7 fL (ref 80.0–100.0)
Monocytes Absolute: 0.8 10*3/uL (ref 0.1–1.0)
Monocytes Relative: 10 %
Neutro Abs: 5.6 10*3/uL (ref 1.7–7.7)
Neutrophils Relative %: 64 %
Platelets: 215 10*3/uL (ref 150–400)
RBC: 2.99 MIL/uL — ABNORMAL LOW (ref 4.22–5.81)
RDW: 16.4 % — ABNORMAL HIGH (ref 11.5–15.5)
WBC: 8.7 10*3/uL (ref 4.0–10.5)
nRBC: 0 % (ref 0.0–0.2)

## 2022-08-02 LAB — COMPREHENSIVE METABOLIC PANEL
ALT: 12 U/L (ref 0–44)
AST: 12 U/L — ABNORMAL LOW (ref 15–41)
Albumin: 4.2 g/dL (ref 3.5–5.0)
Alkaline Phosphatase: 97 U/L (ref 38–126)
Anion gap: 15 (ref 5–15)
BUN: 36 mg/dL — ABNORMAL HIGH (ref 6–20)
CO2: 27 mmol/L (ref 22–32)
Calcium: 9.2 mg/dL (ref 8.9–10.3)
Chloride: 93 mmol/L — ABNORMAL LOW (ref 98–111)
Creatinine, Ser: 11.12 mg/dL — ABNORMAL HIGH (ref 0.61–1.24)
GFR, Estimated: 6 mL/min — ABNORMAL LOW (ref >60)
Glucose, Bld: 87 mg/dL (ref 70–99)
Potassium: 4.4 mmol/L (ref 3.5–5.1)
Sodium: 135 mmol/L (ref 135–145)
Total Bilirubin: 0.5 mg/dL (ref 0.3–1.2)
Total Protein: 7.5 g/dL (ref 6.5–8.1)

## 2022-08-02 LAB — IRON AND TIBC
Iron: 82 ug/dL (ref 45–182)
Saturation Ratios: 30 % (ref 17.9–39.5)
TIBC: 274 ug/dL (ref 250–450)
UIBC: 192 ug/dL

## 2022-08-02 LAB — MAGNESIUM: Magnesium: 3.1 mg/dL — ABNORMAL HIGH (ref 1.7–2.4)

## 2022-08-02 LAB — CK: Total CK: 130 U/L (ref 49–397)

## 2022-08-02 LAB — CULTURE, BLOOD (ROUTINE X 2)

## 2022-08-02 LAB — ETHANOL: Alcohol, Ethyl (B): <10 mg/dL (ref <10)

## 2022-08-02 LAB — FERRITIN: Ferritin: 1234 ng/mL — ABNORMAL HIGH (ref 24–336)

## 2022-08-02 LAB — TSH: TSH: 1.02 u[IU]/mL (ref 0.350–4.500)

## 2022-08-02 LAB — HEPATITIS B SURFACE ANTIGEN: Hepatitis B Surface Ag: NONREACTIVE

## 2022-08-02 LAB — PHOSPHORUS: Phosphorus: 4.1 mg/dL (ref 2.5–4.6)

## 2022-08-02 LAB — VITAMIN B12: Vitamin B-12: 791 pg/mL (ref 180–914)

## 2022-08-02 LAB — AMMONIA: Ammonia: 53 umol/L — ABNORMAL HIGH (ref 9–35)

## 2022-08-02 MED ORDER — LORAZEPAM 2 MG/ML IJ SOLN
2.0000 mg | Freq: Once | INTRAMUSCULAR | Status: AC
  Administered 2022-08-02: 2 mg via INTRAMUSCULAR

## 2022-08-02 MED ORDER — ACETAMINOPHEN 325 MG PO TABS: 650.0000 mg | ORAL_TABLET | Freq: Four times a day (QID) | ORAL | Status: DC | PRN

## 2022-08-02 MED ORDER — CARBIDOPA-LEVODOPA 25-100 MG PO TABS: 1.0000 | ORAL_TABLET | Freq: Three times a day (TID) | ORAL | Status: DC

## 2022-08-02 MED ORDER — CALCITRIOL 0.25 MCG PO CAPS
0.7500 ug | ORAL_CAPSULE | ORAL | Status: DC
  Administered 2022-08-03: 0.75 ug via ORAL
  Filled 2022-08-02: qty 3

## 2022-08-02 MED ORDER — ACETAMINOPHEN 650 MG RE SUPP: 650.0000 mg | Freq: Four times a day (QID) | RECTAL | Status: DC | PRN

## 2022-08-02 MED ORDER — SODIUM CHLORIDE 0.9 % IV BOLUS (SEPSIS)
1000.0000 mL | Freq: Once | INTRAVENOUS | Status: AC
  Administered 2022-08-02: 1000 mL via INTRAVENOUS

## 2022-08-02 MED ORDER — LORAZEPAM 2 MG/ML IJ SOLN: 2.0000 mg | Freq: Once | INTRAMUSCULAR | Status: DC

## 2022-08-02 MED ORDER — CINACALCET HCL 30 MG PO TABS
60.0000 mg | ORAL_TABLET | ORAL | Status: DC
  Administered 2022-08-03: 60 mg via ORAL
  Filled 2022-08-02: qty 2

## 2022-08-02 MED ORDER — LACTATED RINGERS IV SOLN: INTRAVENOUS | Status: DC

## 2022-08-02 MED ORDER — LACTATED RINGERS IV BOLUS: 1000.0000 mL | Freq: Once | INTRAVENOUS | Status: DC

## 2022-08-02 MED ORDER — ONDANSETRON HCL 4 MG PO TABS: 4.0000 mg | ORAL_TABLET | Freq: Four times a day (QID) | ORAL | Status: DC | PRN

## 2022-08-02 MED ORDER — ONDANSETRON HCL 4 MG/2ML IJ SOLN: 4.0000 mg | Freq: Four times a day (QID) | INTRAMUSCULAR | Status: DC | PRN

## 2022-08-02 MED ORDER — CHLORHEXIDINE GLUCONATE CLOTH 2 % EX PADS
6.0000 | MEDICATED_PAD | Freq: Every day | CUTANEOUS | Status: DC
  Administered 2022-08-03 – 2022-08-04 (×2): 6 via TOPICAL

## 2022-08-02 MED ORDER — SUCROFERRIC OXYHYDROXIDE 500 MG PO CHEW
1000.0000 mg | CHEWABLE_TABLET | Freq: Three times a day (TID) | ORAL | Status: DC
  Administered 2022-08-03 – 2022-08-04 (×3): 1000 mg via ORAL
  Filled 2022-08-02 (×5): qty 2

## 2022-08-02 MED ORDER — HYDRALAZINE HCL 20 MG/ML IJ SOLN: 5.0000 mg | Freq: Three times a day (TID) | INTRAMUSCULAR | Status: DC | PRN

## 2022-08-02 NOTE — Assessment & Plan Note (Signed)
Dialysis MWF. Per chart review has not missed any sessions. Will not answer me when asked Due for dialysis tomorrow, no urgent need at this time Seeing transplant team at [facility]  Nephrology consulted and appreciate assitance

## 2022-08-02 NOTE — H&P (Signed)
History and Physical    PatientMarland Burns Xavion Deronde MVH:[id number] DOB: [birth year] DOA: 08/02/2022 DOS: the patient was seen and examined on 08/02/2022 PCP: Marcine Matar, MD  Patient coming from: Home - lives with his son.    Chief Complaint: jerking movements.   HPI: Samuel Burns is a 37 y.o. male with medical history significant of ESRD on HD (M/W/F) secondary to HTN,  HTN, ACD, depression who presented to ED with complaints of involuntary movements.  He was reportedly seen for similar episode of involuntary movements present 1 month ago followed by another episode yesterday, for which she was seen in the ED and discharged home on 08/01/22.  He returned back to the emergency department this evening for additional episode of involuntary movements, during which time he remained conscious.  No known history of seizure disorder or any known history of restless leg syndrome.  He is in bed and not very cooperative. He is sleepy and does not answer all of my questions.   He states he has these episodes where his whole body jerks. Happens daily can last 45 min.to one hour.  He never looses consciousness, bites tongue or has urinary incontinence. No confusion or post ictal state. No hx of seizures. He states episodes typically happen in the late, late evening. He won't answer me when asked any other questions.   He does not smoke or drink alcohol.   ER Course:  vitals: afebrile, bp: 63/49 HR: 91, RR: 21, oxygen: 97%RA Pertinent labs: hgb: 9.7, BUN: 36, creatinine: 11.12,  Ct head: no acute finding. Right frontal sinus disease.  In ED: neurology consulted. Recommended to be admitted for further work up. He as gien ativan and had some soft bp so given 1L IVF bolus. BP averages around 95-100 systolic.   Review of Systems: Unable to obtain due to lack of cooperation from patient.  Past Medical History:  Diagnosis Date   ADHD (attention deficit hyperactivity disorder)    ADHD   Anemia    Anxiety     Asthma    as a child   Depression    Hypertension    Renal failure    Dialysis M/W/F   Past Surgical History:  Procedure Laterality Date   AV FISTULA PLACEMENT Right 11/24/2019   Procedure: Creation of RIGHT ARM Brachial/Cephalic ARTERIOVENOUS (AV) FISTULA.;  Surgeon: [name], MD;  Location: [facility] OR;  Service: Vascular;  Laterality: Right;   AV FISTULA PLACEMENT Left 12/22/2021   Procedure: LEFT ARM BRACHIOCEPHALIC ARTERIOVENOUS (AV) FISTULA CREATION;  Surgeon: [name], MD;  Location: [facility] OR;  Service: Vascular;  Laterality: Left;   BASCILIC VEIN TRANSPOSITION Left 01/27/2022   Procedure: LEFT FIRST STAGE BASILIC VEIN TRANSPOSITION;  Surgeon: [name], MD;  Location: [facility] OR;  Service: Vascular;  Laterality: Left;  PERIPHERAL NERVE BLOCK   IR AV DIALY SHUNT INTRO NEEDLE/INTRACATH INITIAL W/PTA/IMG RIGHT Right 02/11/2021   IR DIALY SHUNT INTRO NEEDLE/INTRACATH INITIAL W/IMG RIGHT Right 04/30/2020   IR FLUORO GUIDE CV LINE LEFT  11/21/2021   IR FLUORO GUIDE CV LINE RIGHT  11/20/2019   IR REMOVAL TUN CV CATH W/O FL  06/04/2020   IR US GUIDE VASC ACCESS LEFT  11/21/2021   IR US GUIDE VASC ACCESS RIGHT  11/20/2019   IR US GUIDE VASC ACCESS RIGHT  04/30/2020   IR US GUIDE VASC ACCESS RIGHT  02/11/2021   Social History:  reports that he has never smoked. He has never been exposed to tobacco smoke.  He has never used smokeless tobacco. He reports that he does not currently use drugs. He reports that he does not drink alcohol.  No Known Allergies  Family History  Problem Relation Age of Onset   Hypertension Maternal Grandmother     Prior to Admission medications   Medication Sig Start Date End Date Taking? Authorizing Provider  acetaminophen (TYLENOL) 500 MG tablet Take 500-1,000 mg by mouth every 6 (six) hours as needed for moderate pain.    [provider]  amLODipine (NORVASC) 10 MG tablet Take 1 tablet (10 mg total) by mouth at bedtime. 12/09/21   Claiborne Rigg, NP  buPROPion (WELLBUTRIN XL) 150 MG 24 hr tablet Take 1 tablet (150 mg total) by mouth in the morning. 04/05/22   Marcine Matar, MD  calcitRIOL (ROCALTROL) 0.5 MCG capsule Take 0.5 mcg by mouth every Monday, Wednesday, and Friday. 02/02/22 02/01/23  [provider]  cloNIDine (CATAPRES) 0.1 MG tablet Take 1 tablet (0.1 mg total) by mouth 2 (two) times daily. 04/05/22   Marcine Matar, MD  fluticasone (FLONASE) 50 MCG/ACT nasal spray Place 2 sprays into both nostrils daily. Patient taking differently: Place 2 sprays into both nostrils in the morning. 05/13/21   Waldon Merl, PA-C  hydrOXYzine (VISTARIL) 25 MG capsule Take 1 capsule (25 mg total) by mouth in the morning and at bedtime. 04/05/22   Marcine Matar, MD  losartan (COZAAR) 50 MG tablet Take 1 tablet (50 mg total) by mouth in the morning. 04/05/22   Marcine Matar, MD  MELATONIN PO Take 1-2 tablets by mouth at bedtime as needed (sleep).    [provider]  Methoxy PEG-Epoetin Beta (MIRCERA IJ) Mircera 02/02/22 02/01/23  [provider]  Metoprolol Tartrate 75 MG TABS TAKE 1 TABLET BY MOUTH TWICE DAILY 03/10/22 03/10/23  Marcine Matar, MD  Multiple Vitamin (MULTIVITAMIN WITH MINERALS) TABS tablet Take 1 tablet by mouth in the morning.    [provider]  Naphazoline HCl (CLEAR EYES OP) Place 1 drop into both eyes 2 (two) times daily as needed (dry/irritated eyes.).    [provider]  oxyCODONE-acetaminophen (PERCOCET/ROXICET) 5-325 MG tablet Take 1 tablet by mouth every 6 (six) hours as needed. 01/27/22   Baglia, Corrina, PA-C  sertraline (ZOLOFT) 100 MG tablet Take 1 tablet (100 mg total) by mouth in the morning. 04/05/22   Marcine Matar, MD  sildenafil (VIAGRA) 50 MG tablet 1 tab PO 1/2 hr prior to sex PRN.  Limit use to 1 tab/24 hr 07/30/20   Marcine Matar, MD  sucroferric oxyhydroxide (VELPHORO) 500 MG chewable tablet Chew 500-1,000 mg by mouth See admin  instructions. Take 2 tablets (1000 mg) by mouth 3 times daily with meals & take 1 tablet (500 mg) by mouth with snacks. 10/22/21   [provider]    Physical Exam: Vitals:   08/02/22 0645 08/02/22 0700 08/02/22 0745 08/02/22 0800  BP: 97/67 (!) 100/54 113/63 101/61  Pulse: 75 80 82 73  Resp: 14 15 18 14   Temp:      TempSrc:      SpO2: 100% 96% 99% 99%   General:  Appears calm and comfortable and is in NAD. Drowsy, intermittently answers some questions.  Eyes:  PERRL, EOMI, normal lids, iris ENT:  grossly normal hearing, lips & tongue, mmm; poor  dentition Neck:  no LAD, masses or thyromegaly; no carotid bruits Cardiovascular:  RRR, no m/r/g. No LE edema.  Respiratory:  CTA bilaterally with no wheezes/rales/rhonchi.  Normal respiratory effort. Abdomen:  soft, NT, ND, NABS Back:   normal alignment, no CVAT Skin:  no rash or induration seen on limited exam. RUE fistula.  Musculoskeletal:  grossly normal tone BUE/BLE, good ROM, no bony abnormality Lower extremity:  No LE edema.  Limited foot exam with no ulcerations.  2+ distal pulses. Psychiatric:  drowsy, intermittently will answer questions appropriately.  grossly normal mood and affect, speech fluent and appropriate, AOx3 Neurologic:  CN 2-12 grossly intact, moves all extremities in coordinated fashion, sensation intact   Radiological Exams on Admission: Independently reviewed - see discussion in A/P where applicable  CT Head Wo Contrast  Result Date: 08/01/2022 CLINICAL DATA:  Mental status changes. EXAM: CT HEAD WITHOUT CONTRAST TECHNIQUE: Contiguous axial images were obtained from the base of the skull through the vertex without intravenous contrast. RADIATION DOSE REDUCTION: This exam was performed according to the departmental dose-optimization program which includes automated exposure control, adjustment of the mA and/or kV according to patient size and/or use of iterative reconstruction technique. COMPARISON:  None  Available. FINDINGS: Brain: No evidence of acute infarction, hemorrhage, hydrocephalus, extra-axial collection or mass lesion/mass effect. Vascular: No hyperdense vessel or unexpected calcification. Skull: Normal. Negative for fracture or focal lesion. Sinuses/Orbits: Opacification of the right frontal sinus. Remainder the paranasal sinuses well aerated. Mastoid air cells unremarkable. Orbits unremarkable. Other: None. IMPRESSION: 1. No acute intracranial abnormality. 2. Right frontal sinus disease. Electronically Signed   By: Annia Belt M.D.   On: 08/01/2022 09:21    EKG: Independently reviewed.  Sinus tachycardia with rate 101; nonspecific ST changes with no evidence of acute ischemia   Labs on Admission: I have personally reviewed the available labs and imaging studies at the time of the admission.  Pertinent labs:   hgb: 9.7,  BUN: 36,  creatinine: 11.12  Assessment and Plan: Principal Problem:   Myoclonus Active Problems:   ESRD (end stage renal disease) on dialysis (HCC)   Mild major depression (HCC)   Essential hypertension   Anemia of chronic disease    Assessment and Plan: * Myoclonus 37 year old presenting with involuntary jerking movements that happen daily and last for 45 minutes to one hour -admitted to progressive -neurology consulted and following -seizure precautions although hx not consistent with this and very low on differential  -MRI brain  -UDS pending -CK wnl, check TSH -B12 wnl, B1 pending.  -check heavy metals (hx of metals in blood in chart review, can not find lab)  -? Drug side effects from zoloft-extrapyramidal sx.  -r/o chorea. Has no tongue movements or grip changes. Need to dive into history more, but will not answer questions.  -consider RLS, but has movement in UE as well. Check ferritin.  -follow up on neuro recommendations  -consider psych eval if work up unrevealing   ESRD (end stage renal disease) on dialysis [facility]) Dialysis MWF. Per chart  review has not missed any sessions. Will not answer me when asked Due for dialysis tomorrow, no urgent need at this time Seeing transplant team at [facility]  Nephrology consulted and appreciate assitance  Mild major depression (HCC) Follow up on MAR. Zoloft/wellbutrin in his chart.  -hold wellbutrin for now for work up even though seizures low on differential  -? If zoloft contributing as well  Holding hydroxyzine until he wakes up more   Essential hypertension Baseline appears to be around 100 systolic He has been to ER twice in last 24 hours and  had soft/hypotensive readings.  Given 1L IVF bolus and blood pressure now 100-115 systolic, at baseline  Denies any light headed/dizziness, but ROS and history difficult to obtain right now  Hold anti-HTN drugs until [facility] updated Will have PRN medication ordered and monitor pressures   Anemia of chronic disease Baseline around 10-11 Appears to be on Mircera  Continue to monitor      Advance Care Planning:   Code Status: Full Code   Consults: neurology and nephrology   DVT Prophylaxis: SCDs   Family Communication: none   Severity of Illness: The appropriate patient status for this patient is INPATIENT. Inpatient status is judged to be reasonable and necessary in order to provide the required intensity of service to ensure the patient's safety. The patient's presenting symptoms, physical exam findings, and initial radiographic and laboratory data in the context of their chronic comorbidities is felt to place them at high risk for further clinical deterioration. Furthermore, it is not anticipated that the patient will be medically stable for discharge from the hospital within 2 midnights of admission.   * I certify that at the point of admission it is my clinical judgment that the patient will require inpatient hospital care spanning beyond 2 midnights from the point of admission due to high intensity of service, high risk for further  deterioration and high frequency of surveillance required.*  Author: Orland Mustard, MD 08/02/2022 8:21 AM  For on call review [email or web address].

## 2022-08-02 NOTE — Consult Note (Signed)
Neurology Consultation Reason for Consult: Abnormal movements Referring Physician: Bebe Shaggy, D  CC: Abnormal movements  History is obtained from: Patient, chart  HPI: Samuel Burns is a 37 y.o. male with history of end-stage renal disease felt to be due to hypertension who presents with abnormal movements.  He states that the first time it happened sometime in 2023, but now he has had it happen several times within a few weeks.  On May 31, he presented with "agitation" and feeling bad return to baseline.  He stated that he was feeling anxious and then started having involuntary movements throughout his body.  On 6/15, he presented again with tremoring activity, was evaluated in the emergency department and quickly returned to normal and therefore was discharged.  Today, he comes in with a third episode of involuntary body movements.  He describes it as a feeling like he just cannot not move them.  He feels an internal sense of restlessness.  He denies any history of restless legs.     Past Medical History:  Diagnosis Date   ADHD (attention deficit hyperactivity disorder)    ADHD   Anemia    Anxiety    Asthma    as a child   Depression    Hypertension    Renal failure    Dialysis M/W/F     Family History  Problem Relation Age of Onset   Hypertension Maternal Grandmother      Social History:  reports that he has never smoked. He has never been exposed to tobacco smoke. He has never used smokeless tobacco. He reports that he does not currently use drugs. He reports that he does not drink alcohol.   Exam: Current vital signs: BP (!) 106/57   Pulse 93   Resp 17   SpO2 100%  Vital signs in last 24 hours: Temp:  [97.4 F (36.3 C)-98.1 F (36.7 C)] 97.4 F (36.3 C) (06/15 1150) Pulse Rate:  [51-102] 93 (06/16 0445) Resp:  [12-28] 17 (06/16 0445) BP: (58-107)/(35-62) 106/57 (06/16 0445) SpO2:  [84 %-100 %] 100 % (06/16 0445) Weight:  [81.6 kg] 81.6 kg (06/15  0746)   Physical Exam  Appears well-developed and well-nourished.   Neuro: Mental Status: Patient is awake, alert, oriented to person, place, month, year, and situation. Patient is able to give a clear and coherent history. No signs of aphasia or neglect Cranial Nerves: II: Visual Fields are full. Pupils are equal, round, and reactive to light.   III,IV, VI: EOMI without ptosis or diploplia.  When I am holding up his eyelids, he states that he cannot control his eyes to look up so that I can test his pupils, but eventually does. V: Facial sensation is symmetric to temperature VII: Facial movement is symmetric.  VIII: hearing is intact to voice X: Uvula elevates symmetrically XI: Shoulder shrug is symmetric. XII: tongue is midline without atrophy or fasciculations.  Motor: Tone is normal. Bulk is normal. 5/5 strength was present in all four extremities.  He repeatedly straightens out his legs, crossing them, keeps rocking his ankles about.  He also has movements of his head side-to-side which he states he cannot control. Sensory: Sensation is symmetric to light touch and temperature in the arms and legs. Deep Tendon Reflexes: He does not relax enough to reliably check reflexes. Plantars: Toes are downgoing bilaterally.  Cerebellar: He repeatedly has abnormal movements while trying to perform finger-nose-finger.  The character of his abnormal movements could be consistent with chorea, but I  am not sure there is definite.    I have reviewed labs in epic and the results pertinent to this consultation are: Results for orders placed or performed during the hospital encounter of 08/02/22 (from the past 24 hour(s))  CBC with Differential     Status: Abnormal   Collection Time: 08/02/22  5:21 AM  Result Value Ref Range   WBC 8.7 4.0 - 10.5 K/uL   RBC 2.99 (L) 4.22 - 5.81 MIL/uL   Hemoglobin 9.7 (L) 13.0 - 17.0 g/dL   HCT 62.9 (L) 52.8 - 41.3 %   MCV 97.7 80.0 - 100.0 fL   MCH 32.4  26.0 - 34.0 pg   MCHC 33.2 30.0 - 36.0 g/dL   RDW 24.4 (H) 01.0 - 27.2 %   Platelets 215 150 - 400 K/uL   nRBC 0.0 0.0 - 0.2 %   Neutrophils Relative % 64 %   Neutro Abs 5.6 1.7 - 7.7 K/uL   Lymphocytes Relative 22 %   Lymphs Abs 1.9 0.7 - 4.0 K/uL   Monocytes Relative 10 %   Monocytes Absolute 0.8 0.1 - 1.0 K/uL   Eosinophils Relative 3 %   Eosinophils Absolute 0.3 0.0 - 0.5 K/uL   Basophils Relative 1 %   Basophils Absolute 0.1 0.0 - 0.1 K/uL   Immature Granulocytes 0 %   Abs Immature Granulocytes 0.03 0.00 - 0.07 K/uL  Comprehensive metabolic panel     Status: Abnormal   Collection Time: 08/02/22  5:21 AM  Result Value Ref Range   Sodium 135 135 - 145 mmol/L   Potassium 4.4 3.5 - 5.1 mmol/L   Chloride 93 (L) 98 - 111 mmol/L   CO2 27 22 - 32 mmol/L   Glucose, Bld 87 70 - 99 mg/dL   BUN 36 (H) 6 - 20 mg/dL   Creatinine, Ser 53.66 (H) 0.61 - 1.24 mg/dL   Calcium 9.2 8.9 - 44.0 mg/dL   Total Protein 7.5 6.5 - 8.1 g/dL   Albumin 4.2 3.5 - 5.0 g/dL   AST 12 (L) 15 - 41 U/L   ALT 12 0 - 44 U/L   Alkaline Phosphatase 97 38 - 126 U/L   Total Bilirubin 0.5 0.3 - 1.2 mg/dL   GFR, Estimated 6 (L) >60 mL/min   Anion gap 15 5 - 15  CK     Status: None   Collection Time: 08/02/22  5:21 AM  Result Value Ref Range   Total CK 130 49 - 397 U/L  Ethanol     Status: None   Collection Time: 08/02/22  5:21 AM  Result Value Ref Range   Alcohol, Ethyl (B) <10 <10 mg/dL  Magnesium     Status: Abnormal   Collection Time: 08/02/22  5:21 AM  Result Value Ref Range   Magnesium 3.1 (H) 1.7 - 2.4 mg/dL  Phosphorus     Status: None   Collection Time: 08/02/22  5:21 AM  Result Value Ref Range   Phosphorus 4.1 2.5 - 4.6 mg/dL     I have reviewed the images obtained: CT head-negative  Impression: 37 year old male with recurrent episodes of abnormal movements in the setting of hypertension and end-stage renal disease.  The abnormal movements are unusual, but I think are most characteristic for  chorea or possibly restless leg type movements.  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 Uremic striatopallidal syndrome would be another consideration,  but again I am not sure if it occurs episodically.  It does not appear to be seizure and I have very low suspicion for such.  Conversion disorder would be another possibility, but I think other avenues of exploration need to be investigated first.  Recommendations: 1) MRI brain without contrast 2) B1, magnesium, phosphorus, homocystine 3) I will try a single dose of levodopa as this can be helpful secondary to restless legs 4) neurology will follow    Ritta Slot, MD [facility] [phone]  If 7pm- 7am, please page neurology on call as listed in AMION.

## 2022-08-02 NOTE — ED Notes (Signed)
Samuel Burns  [phone]

## 2022-08-02 NOTE — ED Triage Notes (Signed)
Pt arrived via [facility] from home for involuntary full body muscle spasms. Family called for concern of seizure like activity. Pt reports "I can't help it". Pt remains oriented, alert, oriented not postictal. VSS per EMS.

## 2022-08-02 NOTE — Consult Note (Signed)
Renal Service Consult Note [facility]  Samuel Burns 08/02/2022 [name], MD Requesting Physician: Dr. Artis Flock, A.   Reason for Consult: ESRD pt w/ body spasms HPI: The patient is a 37 y.o. year-old w/ PMH as below who presented to ED to from home because of involuntary body spasms. In ED pt was alert while having these involuntary body movements. He said it has happened several times in the past few weeks. He was seen by neurology in the ED and work up started w/ MRI.  Pt to be admitted. We are asked to see for ESRD.    Pt seen in ED.  He went for MRI and now he is feeling "much better" and the involuntary body movements have stopped.  He denies missing any HD and makes all his treatments.   Pt lives w/ his son's grandmother and drives himself to dialysis. He runs full times and doesn't miss HD. His Kt/V is > 1.4 going back many months.    ROS - denies CP, no joint pain, no HA, no blurry vision, no rash, no diarrhea, no nausea/ vomiting, no dysuria, no difficulty voiding   Past Medical History  Past Medical History:  Diagnosis Date   ADHD (attention deficit hyperactivity disorder)    ADHD   Anemia    Anxiety    Asthma    as a child   Depression    Hypertension    Renal failure    Dialysis M/W/F   Past Surgical History  Past Surgical History:  Procedure Laterality Date   AV FISTULA PLACEMENT Right 11/24/2019   Procedure: Creation of RIGHT ARM Brachial/Cephalic ARTERIOVENOUS (AV) FISTULA.;  Surgeon: [name], MD;  Location: [facility] OR;  Service: Vascular;  Laterality: Right;   AV FISTULA PLACEMENT Left 12/22/2021   Procedure: LEFT ARM BRACHIOCEPHALIC ARTERIOVENOUS (AV) FISTULA CREATION;  Surgeon: [name], MD;  Location: [facility] OR;  Service: Vascular;  Laterality: Left;   BASCILIC VEIN TRANSPOSITION Left 01/27/2022   Procedure: LEFT FIRST STAGE BASILIC VEIN TRANSPOSITION;  Surgeon: [name], MD;  Location: [facility] OR;  Service: Vascular;   Laterality: Left;  PERIPHERAL NERVE BLOCK   IR AV DIALY SHUNT INTRO NEEDLE/INTRACATH INITIAL W/PTA/IMG RIGHT Right 02/11/2021   IR DIALY SHUNT INTRO NEEDLE/INTRACATH INITIAL W/IMG RIGHT Right 04/30/2020   IR FLUORO GUIDE CV LINE LEFT  11/21/2021   IR FLUORO GUIDE CV LINE RIGHT  11/20/2019   IR REMOVAL TUN CV CATH W/O FL  06/04/2020   IR US GUIDE VASC ACCESS LEFT  11/21/2021   IR US GUIDE VASC ACCESS RIGHT  11/20/2019   IR US GUIDE VASC ACCESS RIGHT  04/30/2020   IR US GUIDE VASC ACCESS RIGHT  02/11/2021   Family History  Family History  Problem Relation Age of Onset   Hypertension Maternal Grandmother    Social History  reports that he has never smoked. He has never been exposed to tobacco smoke. He has never used smokeless tobacco. He reports that he does not currently use drugs. He reports that he does not drink alcohol. Allergies No Known Allergies Home medications Prior to Admission medications   Medication Sig Start Date End Date Taking? Authorizing Provider  acetaminophen (TYLENOL) 500 MG tablet Take 500-1,000 mg by mouth every 6 (six) hours as needed for moderate pain.    [provider]  amLODipine (NORVASC) 10 MG tablet Take 1 tablet (10 mg total) by mouth at bedtime. 12/09/21   Claiborne Rigg, NP  buPROPion (  WELLBUTRIN XL) 150 MG 24 hr tablet Take 1 tablet (150 mg total) by mouth in the morning. 04/05/22   Marcine Matar, MD  calcitRIOL (ROCALTROL) 0.5 MCG capsule Take 0.5 mcg by mouth every Monday, Wednesday, and Friday. 02/02/22 02/01/23  [provider]  cloNIDine (CATAPRES) 0.1 MG tablet Take 1 tablet (0.1 mg total) by mouth 2 (two) times daily. 04/05/22   Marcine Matar, MD  fluticasone (FLONASE) 50 MCG/ACT nasal spray Place 2 sprays into both nostrils daily. Patient taking differently: Place 2 sprays into both nostrils in the morning. 05/13/21   Waldon Merl, PA-C  hydrOXYzine (VISTARIL) 25 MG capsule Take 1 capsule (25 mg total) by mouth in the  morning and at bedtime. 04/05/22   Marcine Matar, MD  losartan (COZAAR) 50 MG tablet Take 1 tablet (50 mg total) by mouth in the morning. 04/05/22   Marcine Matar, MD  MELATONIN PO Take 1-2 tablets by mouth at bedtime as needed (sleep).    [provider]  Methoxy PEG-Epoetin Beta (MIRCERA IJ) Mircera 02/02/22 02/01/23  [provider]  Metoprolol Tartrate 75 MG TABS TAKE 1 TABLET BY MOUTH TWICE DAILY 03/10/22 03/10/23  Marcine Matar, MD  Multiple Vitamin (MULTIVITAMIN WITH MINERALS) TABS tablet Take 1 tablet by mouth in the morning.    [provider]  Naphazoline HCl (CLEAR EYES OP) Place 1 drop into both eyes 2 (two) times daily as needed (dry/irritated eyes.).    [provider]  oxyCODONE-acetaminophen (PERCOCET/ROXICET) 5-325 MG tablet Take 1 tablet by mouth every 6 (six) hours as needed. 01/27/22   Baglia, Corrina, PA-C  sertraline (ZOLOFT) 100 MG tablet Take 1 tablet (100 mg total) by mouth in the morning. 04/05/22   Marcine Matar, MD  sildenafil (VIAGRA) 50 MG tablet 1 tab PO 1/2 hr prior to sex PRN.  Limit use to 1 tab/24 hr 07/30/20   Marcine Matar, MD  sucroferric oxyhydroxide (VELPHORO) 500 MG chewable tablet Chew 500-1,000 mg by mouth See admin instructions. Take 2 tablets (1000 mg) by mouth 3 times daily with meals & take 1 tablet (500 mg) by mouth with snacks. 10/22/21   [provider]     Vitals:   08/02/22 1430 08/02/22 1445 08/02/22 1500 08/02/22 1515  BP: 102/62 104/65 (!) 106/55 97/66  Pulse: 68 68 66 65  Resp: (!) 21 13 17 17   Temp:      TempSrc:      SpO2: 100% 99% 100% 97%  Weight:      Height:       Exam Gen alert, no distress No rash, cyanosis or gangrene Sclera anicteric, throat clear  No jvd or bruits Chest clear bilat to bases, no rales/ wheezing RRR no MRG Abd soft ntnd no mass or ascites +bs GU normal male MS no joint effusions or deformity Ext no LE or UE edema, no wounds or ulcers Neuro  is alert, Ox 3 , nf    LIJ TDC in place      Home meds include - norvasc 10, wellbutrin xl, clonidine 0.1 bid, losartan 50 hs, metoprolol 75 bid, MVI, percocet prn, sertaline, velphoro 1 gm ac tid, prns/ vits/ supps     OP HD: MWF NW  4h   400/1.5   82.5kg  2/2 bath  TDC    Hepain 5000 + - last OP HD 6/14, post wt 82.1kg - sensipar 60mg  po three times per week - rocaltrol 0.75 mcg po three  times per week - mircera 50 mcg IV q 2 wks, last 6/5, due 6/19   Assessment/ Plan: Involuntary body movements - happening periodically, lasting for 45- 60 minutes. Seen by neurology. Although myoclonic jerking can be seen in ESRD pts, it would be unusual to see this in a patient who doesn't miss dialysis and has very good clearance labs. Also, the periodic nature of this pt's issue is not typical for what can be seen at times in the under-dialyzed esrd population.  ESRD - on HD MWF. Last HD was 6/14. Next HD will be here tomorrow.  HTN/ volume - coming off OP HD at dry wt, euvolemic on exam.  Plan UF to dry wt next HD.  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 Follow.  MBD ckd - CCa and phos are in range. Cont po vdra, sensipar and velphoro as binder.    Vinson Moselle  MD [facility] 08/02/2022, 4:10 PM  Recent Labs  Lab 08/01/22 0759 08/01/22 0816 08/02/22 0521  HGB 9.9* 11.2* 9.7*  ALBUMIN 4.3  --  4.2  CALCIUM 9.4  --  9.2  PHOS  --   --  4.1  CREATININE 9.29* 10.00* 11.12*  K 4.2 4.3 4.4   Inpatient medications:   acetaminophen **OR** acetaminophen, hydrALAZINE, ondansetron **OR** ondansetron (ZOFRAN) IV

## 2022-08-02 NOTE — Assessment & Plan Note (Addendum)
37 year old presenting with involuntary jerking movements that happen daily and last for 45 minutes to one hour -admitted to progressive -neurology consulted and following -seizure precautions although hx not consistent with this and very low on differential  -MRI brain  -UDS pending -CK wnl, check TSH -B12 wnl, B1 pending.  -check heavy metals (hx of metals in blood in chart review, can not find lab)  -? Drug side effects from zoloft-extrapyramidal sx.  -r/o chorea. Has no tongue movements or grip changes. Need to dive into history more, but will not answer questions.  -consider RLS, but has movement in UE as well. Check ferritin.  -follow up on neuro recommendations  -consider psych eval if work up unrevealing

## 2022-08-02 NOTE — Progress Notes (Addendum)
  Carryover admission to the Day Admitter.  I discussed this case with the EDP, Dr. Bebe Shaggy.  Per these discussions:   This is a  37 y.o. male w/ ESRD on HD (M/W/F), who is being admitted for further evaluation of involuntary movements.  He was reportedly seen for similar episode of involuntary movements present 1 month ago followed by another episode yesterday, for which she was seen in the ED and discharged home.  He returned back to the emergency department this evening for additional episode of involuntary movements, during which time he remained conscious.  No known history of seizure disorder or any known history of restless leg syndrome.  Appeared to improve following a dose of Ativan.  EDP discussed patient's case with on-call neurology, Dr. Amada Jupiter, Who recommended [facility] admission for further evaluation and management of these involuntary movement episodes, and preliminarily conveyed a differential that includes a form of chorea versus restless leg syndrome, with the patient predisposed to the latter in the setting of his end-stage renal disease.   Dr. Amada Jupiter is recommended and ordered MRI brain for further evaluation, and conveys that neurology will formally consult/follow, with additional recommendations pending.  Patient is reported to be on a Monday, Wednesday, Friday hemodialysis schedule, and has not recently missed any scheduled hemodialysis sessions.  Will be due for his next scheduled hemodialysis session on Monday, 08/03/2022.  Of note, after administration of 2 mg of IM Ativan, he has shown some soft blood pressures, and is currently receiving an IV fluid bolus.  I have placed an order for inpatient admission for further evaluation management of the above.  I have placed some additional preliminary admit orders via the adult multi-morbid admission order set.    Newton Pigg, DO Hospitalist

## 2022-08-02 NOTE — ED Provider Notes (Signed)
[place] EMERGENCY DEPARTMENT AT [facility] Provider Note   CSN: [id number] Arrival date & time: 08/02/22  0418     History  Chief Complaint  Patient presents with   Spasms   Level 5 caveat due to acuity of condition Samuel Burns is a 37 y.o. male.  The history is provided by the patient.  Patient with a history of end-stage renal disease on dialysis, hypertension presents with involuntary body movements.  Patient reports his body is spasming and he cannot help it.  He has not missed any recent dialysis sessions.  He has had recent ER evaluations for the same    Past Medical History:  Diagnosis Date   ADHD (attention deficit hyperactivity disorder)    ADHD   Anemia    Anxiety    Asthma    as a child   Depression    Hypertension    Renal failure    Dialysis M/W/F    Home Medications Prior to Admission medications   Medication Sig Start Date End Date Taking? Authorizing Provider  acetaminophen (TYLENOL) 500 MG tablet Take 500-1,000 mg by mouth every 6 (six) hours as needed for moderate pain.    [provider]  amLODipine (NORVASC) 10 MG tablet Take 1 tablet (10 mg total) by mouth at bedtime. 12/09/21   Claiborne Rigg, NP  buPROPion (WELLBUTRIN XL) 150 MG 24 hr tablet Take 1 tablet (150 mg total) by mouth in the morning. 04/05/22   Marcine Matar, MD  calcitRIOL (ROCALTROL) 0.5 MCG capsule Take 0.5 mcg by mouth every Monday, Wednesday, and Friday. 02/02/22 02/01/23  [provider]  cloNIDine (CATAPRES) 0.1 MG tablet Take 1 tablet (0.1 mg total) by mouth 2 (two) times daily. 04/05/22   Marcine Matar, MD  fluticasone (FLONASE) 50 MCG/ACT nasal spray Place 2 sprays into both nostrils daily. Patient taking differently: Place 2 sprays into both nostrils in the morning. 05/13/21   Waldon Merl, PA-C  hydrOXYzine (VISTARIL) 25 MG capsule Take 1 capsule (25 mg total) by mouth in the morning and at bedtime. 04/05/22   Marcine Matar,  MD  losartan (COZAAR) 50 MG tablet Take 1 tablet (50 mg total) by mouth in the morning. 04/05/22   Marcine Matar, MD  MELATONIN PO Take 1-2 tablets by mouth at bedtime as needed (sleep).    [provider]  Methoxy PEG-Epoetin Beta (MIRCERA IJ) Mircera 02/02/22 02/01/23  [provider]  Metoprolol Tartrate 75 MG TABS TAKE 1 TABLET BY MOUTH TWICE DAILY 03/10/22 03/10/23  Marcine Matar, MD  Multiple Vitamin (MULTIVITAMIN WITH MINERALS) TABS tablet Take 1 tablet by mouth in the morning.    [provider]  Naphazoline HCl (CLEAR EYES OP) Place 1 drop into both eyes 2 (two) times daily as needed (dry/irritated eyes.).    [provider]  oxyCODONE-acetaminophen (PERCOCET/ROXICET) 5-325 MG tablet Take 1 tablet by mouth every 6 (six) hours as needed. 01/27/22   Baglia, Corrina, PA-C  sertraline (ZOLOFT) 100 MG tablet Take 1 tablet (100 mg total) by mouth in the morning. 04/05/22   Marcine Matar, MD  sildenafil (VIAGRA) 50 MG tablet 1 tab PO 1/2 hr prior to sex PRN.  Limit use to 1 tab/24 hr 07/30/20   Marcine Matar, MD  sucroferric oxyhydroxide (VELPHORO) 500 MG chewable tablet Chew 500-1,000 mg by mouth See admin instructions. Take 2 tablets (1000 mg) by mouth 3 times daily with meals & take 1 tablet (500  mg) by mouth with snacks. 10/22/21   [provider]      Allergies    Patient has no known allergies.    Review of Systems   Review of Systems  Unable to perform ROS: Acuity of condition    Physical Exam Updated Vital Signs BP (!) 78/40   Pulse 81   Temp 98.3 F (36.8 C) (Rectal)   Resp 13   SpO2 99%  Physical Exam CONSTITUTIONAL: Ill-appearing, writhing in the bed HEAD: Normocephalic/atraumatic EYES: EOMI/PERRL ENMT: Mucous membranes dry NECK: supple no meningeal signs CV: S1/S2 noted, no murmurs/rubs/gallops noted LUNGS: Lungs are clear to auscultation bilaterally, no apparent distress ABDOMEN: soft, nontender NEURO: Pt  is awake/alert, and he answers questions appropriately.  Patient is writhing around the bed, waving all of his extremities EXTREMITIES: pulses normal/equal, full ROM, no deformities SKIN: warm, color normal, catheter to chest noted PSYCH anxious  ED Results / Procedures / Treatments   Labs (all labs ordered are listed, but only abnormal results are displayed) Labs Reviewed  CBC WITH DIFFERENTIAL/PLATELET - Abnormal; Notable for the following components:      Result Value   RBC 2.99 (*)    Hemoglobin 9.7 (*)    HCT 29.2 (*)    RDW 16.4 (*)    All other components within normal limits  COMPREHENSIVE METABOLIC PANEL - Abnormal; Notable for the following components:   Chloride 93 (*)    BUN 36 (*)    Creatinine, Ser 11.12 (*)    AST 12 (*)    GFR, Estimated 6 (*)    All other components within normal limits  MAGNESIUM - Abnormal; Notable for the following components:   Magnesium 3.1 (*)    All other components within normal limits  CK  ETHANOL  IRON AND TIBC  VITAMIN B12  PHOSPHORUS  RAPID URINE DRUG SCREEN, HOSP PERFORMED  VITAMIN B1  HOMOCYSTEINE    EKG EKG Interpretation  Date/Time:  Sunday August 02 2022 04:27:15 EDT Ventricular Rate:  101 PR Interval:  168 QRS Duration: 99 QT Interval:  361 QTC Calculation: 468 R Axis:   53 Text Interpretation: Sinus tachycardia Interpretation limited secondary to artifact Confirmed by Zadie Rhine ([id number]) on 08/02/2022 4:43:36 AM  Radiology CT Head Wo Contrast  Result Date: 08/01/2022 CLINICAL DATA:  Mental status changes. EXAM: CT HEAD WITHOUT CONTRAST TECHNIQUE: Contiguous axial images were obtained from the base of the skull through the vertex without intravenous contrast. RADIATION DOSE REDUCTION: This exam was performed according to the departmental dose-optimization program which includes automated exposure control, adjustment of the mA and/or kV according to patient size and/or use of iterative reconstruction technique.  COMPARISON:  None Available. FINDINGS: Brain: No evidence of acute infarction, hemorrhage, hydrocephalus, extra-axial collection or mass lesion/mass effect. Vascular: No hyperdense vessel or unexpected calcification. Skull: Normal. Negative for fracture or focal lesion. Sinuses/Orbits: Opacification of the right frontal sinus. Remainder the paranasal sinuses well aerated. Mastoid air cells unremarkable. Orbits unremarkable. Other: None. IMPRESSION: 1. No acute intracranial abnormality. 2. Right frontal sinus disease. Electronically Signed   By: Annia Belt M.D.   On: 08/01/2022 09:21    Procedures .Critical Care  Performed by: Zadie Rhine, MD Authorized by: Zadie Rhine, MD   Critical care provider statement:    Critical care time (minutes):  60   Critical care start time:  08/02/2022 5:30 AM   Critical care end time:  08/02/2022 6:30 AM   Critical care time was exclusive of:  Separately billable  procedures and treating other patients   Critical care was necessary to treat or prevent imminent or life-threatening deterioration of the following conditions:  Renal failure and CNS failure or compromise   Critical care was time spent personally by me on the following activities:  Examination of patient, re-evaluation of patient's condition, review of old charts, pulse oximetry, ordering and review of laboratory studies, ordering and performing treatments and interventions, development of treatment plan with patient or surrogate and discussions with consultants   I assumed direction of critical care for this patient from another provider in my specialty: no     Care discussed with: admitting provider       Medications Ordered in ED Medications  carbidopa-levodopa (SINEMET IR) 25-100 MG per tablet immediate release 1 tablet (has no administration in time range)  acetaminophen (TYLENOL) tablet 650 mg (has no administration in time range)    Or  acetaminophen (TYLENOL) suppository 650 mg (has  no administration in time range)  LORazepam (ATIVAN) injection 2 mg (2 mg Intramuscular Given 08/02/22 0430)  sodium chloride 0.9 % bolus 1,000 mL (1,000 mLs Intravenous New Bag/Given 08/02/22 0555)    ED Course/ Medical Decision Making/ A&P Clinical Course as of 08/02/22 0629  Sun Aug 02, 2022  0441 Discussed with Dr. Amada Jupiter with neurology.  Due to patient's recent ED visits for similar presentation, would appreciate neurology evaluation [DW]  0627 Creatinine(!): 11.12 End-stage renal disease [DW]  0627 Hemoglobin(!): 9.7 Anemia [DW]  1610 Patient responded to Ativan.  He is now resting comfortably.  He did have a drop in his blood pressure which has responded to IV fluids.  He has similar drop in his blood pressure on recent ED visit that recovered.  Patient to be admitted for further neurologic evaluation. [DW]    Clinical Course User Index [DW] Zadie Rhine, MD                             Medical Decision Making Amount and/or Complexity of Data Reviewed Labs: ordered. Decision-making details documented in ED Course. ECG/medicine tests: ordered.  Risk Prescription drug management. Decision regarding hospitalization.   This patient presents to the ED for concern of involuntary body movements, this involves an extensive number of treatment options, and is a complaint that carries with it a high risk of complications and morbidity.  The differential diagnosis includes but is not limited to status epilepticus, electrolyte abnormality, myoclonus, movement disorder, conversion disorder  Comorbidities that complicate the patient evaluation: Patient's presentation is complicated by their history of ESRD  Additional history obtained: Additional history obtained from EMS  Records reviewed previous admission documents  Lab Tests: I Ordered, and personally interpreted labs.  The pertinent results include: End-stage renal disease, anemia  Medicines ordered and prescription  drug management: I ordered medication including Ativan for abnormal movements Reevaluation of the patient after these medicines showed that the patient    improved   Critical Interventions:   Ativan for his abnormal movements  Consultations Obtained: I requested consultation with the admitting physician Dr. Arlean Hopping or and consultant neurology , and discussed  findings as well as pertinent plan - they recommend: admit, will need MRI  Reevaluation: After the interventions noted above, I reevaluated the patient and found that they have :improved  Complexity of problems addressed: Patient's presentation is most consistent with  acute presentation with potential threat to life or bodily function  Disposition: After consideration of the diagnostic results and  the patient's response to treatment,  I feel that the patent would benefit from admission   .           Final Clinical Impression(s) / ED Diagnoses Final diagnoses:  ESRD (end stage renal disease) (HCC)  Involuntary movements    Rx / DC Orders ED Discharge Orders     None         Zadie Rhine, MD 08/02/22 [phone]

## 2022-08-02 NOTE — Assessment & Plan Note (Addendum)
Follow up on MAR. Zoloft/wellbutrin in his chart.  -hold wellbutrin for now for work up even though seizures low on differential  -? If zoloft contributing as well  Holding hydroxyzine until he wakes up more

## 2022-08-02 NOTE — Assessment & Plan Note (Signed)
Baseline around 10-11 Appears to be on Mircera  Continue to monitor

## 2022-08-02 NOTE — Progress Notes (Signed)
Neurology Progress Note  Subjective: -No acute complaints, initially reports of spasms are ongoing but then when fully awake and confirms that his spasms have resolved  Exam: Vitals:   08/02/22 0800 08/02/22 0845  BP: 101/61 107/62  Pulse: 73 67  Resp: 14 13  Temp:    SpO2: 99% 99%   Gen: In bed, comfortable, sleepy Resp: non-labored breathing, no grossly audible wheezing Cardiac: Perfusing extremities well  Abd: soft, nt  Neuro: MS: Very sleepy but awakens to repeated stimulation.  Follows simple commands.  Fluent speech.  Oriented to [facility], age, time, but repeatedly states he is at home when asked to specific location CN: Face symmetric, roving eye movements bilaterally when sleepy/thinking,  Motor: Equal antigravity strength in bilateral arms and legs, no obvious weakness.  No abnormal movements Sensory: Unreliable report of sensory testing due to sleepiness DTR: 3+ and symmetric brachioradialis, biceps.  4+ and symmetric patellar reflexes bilaterally.  At least 3 beats of ankle clonus on the left, on the right difficult to test due to patient volitionally dorsiflexing foot.  Toes downgoing bilaterally.  No clear jaw jerk  Pertinent Labs:  Basic Metabolic Panel: Recent Labs  Lab 08/01/22 0759 08/01/22 0816 08/02/22 0521  NA 134* 135 135  K 4.2 4.3 4.4  CL 93* 97* 93*  CO2 25  --  27  GLUCOSE 89 86 87  BUN 27* 34* 36*  CREATININE 9.29* 10.00* 11.12*  CALCIUM 9.4  --  9.2  MG  --   --  3.1*  PHOS  --   --  4.1    CBC: Recent Labs  Lab 08/01/22 0759 08/01/22 0816 08/02/22 0521  WBC 6.0  --  8.7  NEUTROABS  --   --  5.6  HGB 9.9* 11.2* 9.7*  HCT 31.1* 33.0* 29.2*  MCV 96.3  --  97.7  PLT 237  --  215    Coagulation Studies: No results for input(s): "LABPROT", "INR" in the last 72 hours.    Impression:  37 year old male with recurrent episodes of abnormal movements in the setting of hypertension and end-stage renal disease.  The abnormal  movements are unusual, but as witnessed by my colleague Dr. Amada Jupiter were felt to be most characteristic for chorea or possibly restless leg type movements.  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  It does not appear to be seizure and I have very low suspicion for such.  Conversion disorder would be another possibility, but I think other avenues of exploration need to be investigated first.   Reflexes were not able to be obtained on initial evaluation by Dr. Amada Jupiter given patient lack of relaxation.  Given his sleepy state I was able to check reflexes and they are notably increased throughout, greater in the lower extremities in the upper extremities, but with absent jaw jerk concerning for potential C-spine problem.  Given the resolution of his spasms already I do not think a levodopa trial would be helpful at this time and I will hold off on this medication for now  When associated with hypocalcemia, hypermagnesemia may induce choreiform movements and seizures; while his magnesium is slightly elevated, he does not have significant hypocalcemia at this time, and typically would not expect symptoms until magnesium was at least greater than 4, if not greater than  7  Appreciate primary team noting some history of possible heavy metal abnormality for which labs have been ordered  Recommendations: -MRI C-spine in addition to MRI brain -Will follow-up B1, homocystine, these may take some time to result and may need to be followed up on an outpatient basis -If MRI brain and C-spine are reassuring, and symptoms remain resolved, outpatient follow-up may be appropriate -Appreciate management of comorbidities per primary team, recommendations communicated via secure chat  Brooke Dare MD-PhD [facility] [phone]  Available 7 AM to 7 PM, outside these hours please contact Neurologist on call listed on AMION   No charge, same-day note

## 2022-08-02 NOTE — ED Notes (Signed)
ED TO INPATIENT HANDOFF REPORT  ED Nurse Name and Phone #: Vernona Rieger [phone]  S Name/Age/Gender Samuel Burns 37 y.o. male Room/Bed: 006C/006C  Code Status   Code Status: Full Code  Home/SNF/Other Home Patient oriented to: self, place, time, and situation Is this baseline? Yes   Triage Complete: Triage complete  Chief Complaint Myoclonus [G25.3]  Triage Note Pt arrived via [facility] from home for involuntary full body muscle spasms. Family called for concern of seizure like activity. Pt reports "I can't help it". Pt remains oriented, alert, oriented not postictal. VSS per EMS.    Allergies No Known Allergies  Level of Care/Admitting Diagnosis ED Disposition     ED Disposition  Admit   Condition  --   Comment  Hospital [facility]: [facility] [[id number]]  Level of Care: Progressive [102]  Admit to Progressive based on following criteria: MULTISYSTEM THREATS such as stable sepsis, metabolic/electrolyte imbalance with or without encephalopathy that is responding to early treatment.  May admit patient to [facility] or [facility] if equivalent level of care is available:: No  Covid Evaluation: Asymptomatic - no recent exposure (last 10 days) testing not required  Diagnosis: Myoclonus [333.2.ICD-9-CM]  Admitting Physician: Angie Fava [[id number]]  Attending Physician: Angie Fava [[id number]]  Certification:: I certify this patient will need inpatient services for at least 2 midnights  Estimated Length of Stay: 2          B Medical/Surgery History Past Medical History:  Diagnosis Date   ADHD (attention deficit hyperactivity disorder)    ADHD   Anemia    Anxiety    Asthma    as a child   Depression    Hypertension    Renal failure    Dialysis M/W/F   Past Surgical History:  Procedure Laterality Date   AV FISTULA PLACEMENT Right 11/24/2019   Procedure: Creation of RIGHT ARM Brachial/Cephalic ARTERIOVENOUS (AV) FISTULA.;  Surgeon: [name], MD;  Location: [facility] OR;  Service: Vascular;  Laterality: Right;   AV FISTULA PLACEMENT Left 12/22/2021   Procedure: LEFT ARM BRACHIOCEPHALIC ARTERIOVENOUS (AV) FISTULA CREATION;  Surgeon: [name], MD;  Location: [facility] OR;  Service: Vascular;  Laterality: Left;   BASCILIC VEIN TRANSPOSITION Left 01/27/2022   Procedure: LEFT FIRST STAGE BASILIC VEIN TRANSPOSITION;  Surgeon: [name], MD;  Location: [facility] OR;  Service: Vascular;  Laterality: Left;  PERIPHERAL NERVE BLOCK   IR AV DIALY SHUNT INTRO NEEDLE/INTRACATH INITIAL W/PTA/IMG RIGHT Right 02/11/2021   IR DIALY SHUNT INTRO NEEDLE/INTRACATH INITIAL W/IMG RIGHT Right 04/30/2020   IR FLUORO GUIDE CV LINE LEFT  11/21/2021   IR FLUORO GUIDE CV LINE RIGHT  11/20/2019   IR REMOVAL TUN CV CATH W/O FL  06/04/2020   IR US GUIDE VASC ACCESS LEFT  11/21/2021   IR US GUIDE VASC ACCESS RIGHT  11/20/2019   IR US GUIDE VASC ACCESS RIGHT  04/30/2020   IR US GUIDE VASC ACCESS RIGHT  02/11/2021     A IV Location/Drains/Wounds Patient Lines/Drains/Airways Status     Active Line/Drains/Airways     Name Placement date Placement time Site Days   Peripheral IV 08/02/22 20 G 1.88" Right;Upper Arm 08/02/22  0525  Arm  less than 1   Fistula / Graft Right Upper arm Arteriovenous fistula 11/24/19  0850  Upper arm  982   Fistula / Graft Left Upper arm Arteriovenous fistula 01/27/22  0814  Upper arm  187   Hemodialysis Catheter Right Internal jugular  Double lumen Permanent (Tunneled) 11/20/19  1019  Internal jugular  986   Wound / Incision (Open or Dehisced) 11/20/19 Puncture Neck Right Venous access puncture site- Right IJ  11/20/19  1032  Neck  986   Wound / Incision (Open or Dehisced) 11/20/19 Other (Comment) Chest Right;Anterior;Upper tunneled catheter insertion site  11/20/19  1040  Chest  986            Intake/Output Last 24 hours  Intake/Output Summary (Last 24 hours) at 08/02/2022 1816 Last data filed at 08/02/2022 0657 Gross per 24 hour   Intake 1000 ml  Output --  Net 1000 ml    Labs/Imaging Results for orders placed or performed during the hospital encounter of 08/02/22 (from the past 48 hour(s))  CBC with Differential     Status: Abnormal   Collection Time: 08/02/22  5:21 AM  Result Value Ref Range   WBC 8.7 4.0 - 10.5 K/uL   RBC 2.99 (L) 4.22 - 5.81 MIL/uL   Hemoglobin 9.7 (L) 13.0 - 17.0 g/dL   HCT 82.9 (L) 56.2 - 13.0 %   MCV 97.7 80.0 - 100.0 fL   MCH 32.4 26.0 - 34.0 pg   MCHC 33.2 30.0 - 36.0 g/dL   RDW 86.5 (H) 78.4 - 69.6 %   Platelets 215 150 - 400 K/uL   nRBC 0.0 0.0 - 0.2 %   Neutrophils Relative % 64 %   Neutro Abs 5.6 1.7 - 7.7 K/uL   Lymphocytes Relative 22 %   Lymphs Abs 1.9 0.7 - 4.0 K/uL   Monocytes Relative 10 %   Monocytes Absolute 0.8 0.1 - 1.0 K/uL   Eosinophils Relative 3 %   Eosinophils Absolute 0.3 0.0 - 0.5 K/uL   Basophils Relative 1 %   Basophils Absolute 0.1 0.0 - 0.1 K/uL   Immature Granulocytes 0 %   Abs Immature Granulocytes 0.03 0.00 - 0.07 K/uL    Comment: Performed at [facility], [address], [place], [place] [address]  Comprehensive metabolic panel     Status: Abnormal   Collection Time: 08/02/22  5:21 AM  Result Value Ref Range   Sodium 135 135 - 145 mmol/L   Potassium 4.4 3.5 - 5.1 mmol/L   Chloride 93 (L) 98 - 111 mmol/L   CO2 27 22 - 32 mmol/L   Glucose, Bld 87 70 - 99 mg/dL    Comment: Glucose reference range applies only to samples taken after fasting for at least 8 hours.   BUN 36 (H) 6 - 20 mg/dL   Creatinine, Ser 41.32 (H) 0.61 - 1.24 mg/dL   Calcium 9.2 8.9 - 44.0 mg/dL   Total Protein 7.5 6.5 - 8.1 g/dL   Albumin 4.2 3.5 - 5.0 g/dL   AST 12 (L) 15 - 41 U/L   ALT 12 0 - 44 U/L   Alkaline Phosphatase 97 38 - 126 U/L   Total Bilirubin 0.5 0.3 - 1.2 mg/dL   GFR, Estimated 6 (L) >60 mL/min    Comment: (NOTE) Calculated using the CKD-EPI Creatinine Equation (2021)    Anion gap 15 5 - 15    Comment: Performed at [facility], [address] [place], [place] [address]  CK     Status: None   Collection Time: 08/02/22  5:21 AM  Result Value Ref Range   Total CK 130 49 - 397 U/L    Comment: Performed at [facility], [address],  [place], [place] [address]  Ethanol     Status: None   Collection Time: 08/02/22  5:21 AM  Result Value Ref Range   Alcohol, Ethyl (B) <10 <10 mg/dL    Comment: (NOTE) Lowest detectable limit for serum alcohol is 10 mg/dL.  For medical purposes only. Performed at [facility], [address] [place], [place] [address]   Iron and TIBC     Status: None   Collection Time: 08/02/22  5:21 AM  Result Value Ref Range   Iron 82 45 - 182 ug/dL   TIBC 098 119 - 147 ug/dL   Saturation Ratios 30 17.9 - 39.5 %   UIBC 192 ug/dL    Comment: Performed at [facility], [address] [place], [place] [address]  Vitamin B12     Status: None   Collection Time: 08/02/22  5:21 AM  Result Value Ref Range   Vitamin B-12 791 180 - 914 pg/mL    Comment: (NOTE) This assay is not validated for testing neonatal or myeloproliferative syndrome specimens for Vitamin B12 levels. Performed at [facility], [address], [place], [place] [address]   Magnesium     Status: Abnormal   Collection Time: 08/02/22  5:21 AM  Result Value Ref Range   Magnesium 3.1 (H) 1.7 - 2.4 mg/dL    Comment: Performed at [facility], [address] [place], [place] [address]  Phosphorus     Status: None   Collection Time: 08/02/22  5:21 AM  Result Value Ref Range   Phosphorus 4.1 2.5 - 4.6 mg/dL    Comment: Performed at [facility], [address] [place], [place] [address]  TSH     Status: None   Collection Time: 08/02/22  9:18 AM  Result Value Ref Range   TSH 1.020 0.350 - 4.500 uIU/mL    Comment: Performed by a 3rd Generation assay with a functional sensitivity of <=0.01 uIU/mL. Performed at [facility], [address] [place], [place] [address]   Ferritin     Status:  Abnormal   Collection Time: 08/02/22  9:18 AM  Result Value Ref Range   Ferritin 1,234 (H) 24 - 336 ng/mL    Comment: Performed at [facility], [address] [place], [place] [address]   MR CERVICAL SPINE WO CONTRAST  Result Date: 08/02/2022 CLINICAL DATA:  Myelopathy, acute. EXAM: MRI CERVICAL SPINE WITHOUT CONTRAST TECHNIQUE: Multiplanar, multisequence MR imaging of the cervical spine was performed. No intravenous contrast was administered. COMPARISON:  None Available. FINDINGS: Alignment: No significant listhesis is present. Straightening of the normal lumbar lordosis is present. Vertebrae: Marrow signal and vertebral body heights are normal. Cord: Vocal cords are midline and symmetric.  Trachea is clear. Posterior Fossa, vertebral arteries, paraspinal tissues: Negative. Disc levels: No significant focal disc disease or stenosis is present. IMPRESSION: Negative MRI of the cervical spine. Electronically Signed   By: Marin Roberts M.D.   On: 08/02/2022 11:59   MR BRAIN WO CONTRAST  Result Date: 08/02/2022 CLINICAL DATA:  Deficit, neuro degenerative disorder suspected. Stroke suspected. Seizure like activity. EXAM: MRI HEAD WITHOUT CONTRAST TECHNIQUE: Multiplanar, multiecho pulse sequences of the brain and surrounding structures were obtained without intravenous contrast. COMPARISON:  None Available. FINDINGS: Brain: No acute infarct, hemorrhage, or mass lesion is present. Periventricular T2 hyperintensities are moderately advanced for age. A remote subcortical hemorrhagic infarct is present in the posterior right parietal lobe. The white  matter changes emanate from the corpus callosum. The ventricles are of normal size. No significant extraaxial fluid collection is present. The brainstem and cerebellum are within normal limits. The internal auditory canals are within normal limits. Midline structures are within normal limits. Dedicated imaging of the temporal lobes demonstrates no focal  mass lesion. Hippocampal structures are symmetric in size and signal. Vascular: Flow is present in the major intracranial arteries. Skull and upper cervical spine: The craniocervical junction is normal. Upper cervical spine is within normal limits. Marrow signal is unremarkable. Sinuses/Orbits: The right frontal sinus is opacified. The paranasal sinuses and mastoid air cells are otherwise clear. The globes and orbits are within normal limits. IMPRESSION: 1. No acute intracranial abnormality. 2. Periventricular T2 hyperintensities are moderately advanced for age. The finding is nonspecific but can be seen in the setting of chronic microvascular ischemia, a demyelinating process such as multiple sclerosis, vasculitis, complicated migraine headaches, or as the sequelae of a prior infectious or inflammatory process. 3. 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 Electronically Signed   By: Marin Roberts M.D.   On: 08/02/2022 11:53   CT Head Wo Contrast  Result Date: 08/01/2022 CLINICAL DATA:  Mental status changes. EXAM: CT HEAD WITHOUT CONTRAST TECHNIQUE: Contiguous axial images were obtained from the base of the skull through the vertex without intravenous contrast. RADIATION DOSE REDUCTION: This exam was performed according to the departmental dose-optimization program which includes automated exposure control, adjustment of the mA and/or kV according to patient size and/or use of iterative reconstruction technique. COMPARISON:  None Available. FINDINGS: Brain: No evidence of acute infarction, hemorrhage, hydrocephalus, extra-axial collection or mass lesion/mass effect. Vascular: No hyperdense vessel or unexpected calcification. Skull: Normal. Negative for fracture or focal lesion. Sinuses/Orbits: Opacification of the right frontal sinus. Remainder the paranasal sinuses well aerated. Mastoid air cells unremarkable. Orbits unremarkable. Other: None. IMPRESSION: 1. No acute  intracranial abnormality. 2. Right frontal sinus disease. Electronically Signed   By: Annia Belt M.D.   On: 08/01/2022 09:21    Pending Labs Unresulted Labs (From admission, onward)     Start     Ordered   08/03/22 0500  Basic metabolic panel  Tomorrow morning,   R       Question:  Specimen collection method  Answer:  IV Team=IV Team collect   08/02/22 0823   08/03/22 0500  CBC  Tomorrow morning,   R       Question:  Specimen collection method  Answer:  IV Team=IV Team collect   08/02/22 1610   08/02/22 1650  Hepatitis B surface antigen  (New Admission Hemo Labs (Hepatitis B))  Once,   R       Question:  Specimen collection method  Answer:  IV Team=IV Team collect   08/02/22 1650   08/02/22 1650  Hepatitis B surface antibody,quantitative  (New Admission Hemo Labs (Hepatitis B))  Once,   R       Question:  Specimen collection method  Answer:  IV Team=IV Team collect   08/02/22 1650   08/02/22 0948  Ammonia  Add-on,   AD       Question:  Specimen collection method  Answer:  IV Team=IV Team collect   08/02/22 0947   08/02/22 0808  Heavy metals, blood  Once,   R       Question:  Specimen collection method  Answer:  IV Team=IV Team collect   08/02/22 0807   08/02/22 0507  Homocysteine  Once,  URGENT        08/02/22 0506   08/02/22 0506  Vitamin B1  Once,   URGENT        08/02/22 0505   08/02/22 0427  Rapid urine drug screen (hospital performed)  ONCE - STAT,   STAT        08/02/22 0426            Vitals/Pain Today's Vitals   08/02/22 1727 08/02/22 1730 08/02/22 1745 08/02/22 1800  BP: 110/73 112/71 102/86 111/72  Pulse: 65 69 65 68  Resp: 17 16 11 16   Temp: 97.6 F (36.4 C)     TempSrc: Oral     SpO2: 100% 98% 100% 100%  Weight:      Height:      PainSc:        Isolation Precautions No active isolations  Medications Medications  acetaminophen (TYLENOL) tablet 650 mg (has no administration in time range)    Or  acetaminophen (TYLENOL) suppository 650 mg (has no  administration in time range)  ondansetron (ZOFRAN) tablet 4 mg (has no administration in time range)    Or  ondansetron (ZOFRAN) injection 4 mg (has no administration in time range)  hydrALAZINE (APRESOLINE) injection 5 mg (has no administration in time range)  Chlorhexidine Gluconate Cloth 2 % PADS 6 each (has no administration in time range)  cinacalcet (SENSIPAR) tablet 60 mg (has no administration in time range)  calcitRIOL (ROCALTROL) capsule 0.75 mcg (has no administration in time range)  sucroferric oxyhydroxide (VELPHORO) chewable tablet 1,000 mg (has no administration in time range)  LORazepam (ATIVAN) injection 2 mg (2 mg Intramuscular Given 08/02/22 0430)  sodium chloride 0.9 % bolus 1,000 mL (0 mLs Intravenous Stopped 08/02/22 0657)    Mobility Walks-Hasn't been out of bed in ED.      Focused Assessments Neuro Assessment Handoff:  Swallow screen pass? Yes  Cardiac Rhythm: Normal sinus rhythm       Neuro Assessment: Exceptions to WDL Neuro Checks:      Has TPA been given? No If patient is a Neuro Trauma and patient is going to OR before floor call report to 4N Charge nurse: [phone] or [phone]   R Recommendations: See Admitting Provider Note  Report given to:   Additional Notes:

## 2022-08-02 NOTE — Assessment & Plan Note (Signed)
Baseline appears to be around 100 systolic He has been to ER twice in last 24 hours and had soft/hypotensive readings.  Given 1L IVF bolus and blood pressure now 100-115 systolic, at baseline  Denies any light headed/dizziness, but ROS and history difficult to obtain right now  Hold anti-HTN drugs until [facility] updated Will have PRN medication ordered and monitor pressures

## 2022-08-03 DIAGNOSIS — G253 Myoclonus: Secondary | ICD-10-CM | POA: Diagnosis not present

## 2022-08-03 DIAGNOSIS — R259 Unspecified abnormal involuntary movements: Secondary | ICD-10-CM | POA: Diagnosis not present

## 2022-08-03 LAB — BASIC METABOLIC PANEL
Anion gap: 16 — ABNORMAL HIGH (ref 5–15)
BUN: 50 mg/dL — ABNORMAL HIGH (ref 6–20)
CO2: 24 mmol/L (ref 22–32)
Calcium: 8.8 mg/dL — ABNORMAL LOW (ref 8.9–10.3)
Chloride: 100 mmol/L (ref 98–111)
Creatinine, Ser: 13.58 mg/dL — ABNORMAL HIGH (ref 0.61–1.24)
GFR, Estimated: 4 mL/min — ABNORMAL LOW (ref >60)
Glucose, Bld: 74 mg/dL (ref 70–99)
Potassium: 4.7 mmol/L (ref 3.5–5.1)
Sodium: 140 mmol/L (ref 135–145)

## 2022-08-03 LAB — CBC
HCT: 27.9 % — ABNORMAL LOW (ref 39.0–52.0)
Hemoglobin: 8.8 g/dL — ABNORMAL LOW (ref 13.0–17.0)
MCH: 31.3 pg (ref 26.0–34.0)
MCHC: 31.5 g/dL (ref 30.0–36.0)
MCV: 99.3 fL (ref 80.0–100.0)
Platelets: 189 10*3/uL (ref 150–400)
RBC: 2.81 MIL/uL — ABNORMAL LOW (ref 4.22–5.81)
RDW: 16 % — ABNORMAL HIGH (ref 11.5–15.5)
WBC: 7.1 10*3/uL (ref 4.0–10.5)
nRBC: 0 % (ref 0.0–0.2)

## 2022-08-03 LAB — CULTURE, BLOOD (ROUTINE X 2)

## 2022-08-03 LAB — HOMOCYSTEINE: Homocysteine: 47.7 umol/L — ABNORMAL HIGH (ref 0.0–14.5)

## 2022-08-03 MED ORDER — METOPROLOL TARTRATE 75 MG PO TABS: 1.0000 | ORAL_TABLET | Freq: Two times a day (BID) | ORAL | Status: DC

## 2022-08-03 MED ORDER — HEPARIN SODIUM (PORCINE) 1000 UNIT/ML IJ SOLN
INTRAMUSCULAR | Status: AC
  Filled 2022-08-03: qty 7

## 2022-08-03 MED ORDER — HYDROXYZINE HCL 25 MG PO TABS: 25.0000 mg | ORAL_TABLET | Freq: Three times a day (TID) | ORAL | Status: DC | PRN

## 2022-08-03 MED ORDER — HYDROXYZINE PAMOATE 25 MG PO CAPS: 25.0000 mg | ORAL_CAPSULE | Freq: Three times a day (TID) | ORAL | Status: DC | PRN

## 2022-08-03 MED ORDER — METOPROLOL TARTRATE 50 MG PO TABS
50.0000 mg | ORAL_TABLET | Freq: Two times a day (BID) | ORAL | Status: DC
  Administered 2022-08-03: 50 mg via ORAL
  Filled 2022-08-03 (×2): qty 1

## 2022-08-03 MED ORDER — HEPARIN SODIUM (PORCINE) 5000 UNIT/ML IJ SOLN
5000.0000 [IU] | Freq: Three times a day (TID) | INTRAMUSCULAR | Status: DC
  Administered 2022-08-03 – 2022-08-04 (×3): 5000 [IU] via SUBCUTANEOUS
  Filled 2022-08-03 (×3): qty 1

## 2022-08-03 MED ORDER — SERTRALINE HCL 100 MG PO TABS
100.0000 mg | ORAL_TABLET | Freq: Every morning | ORAL | Status: DC
  Administered 2022-08-03 – 2022-08-04 (×2): 100 mg via ORAL
  Filled 2022-08-03 (×2): qty 1

## 2022-08-03 MED ORDER — HYDRALAZINE HCL 20 MG/ML IJ SOLN: 10.0000 mg | Freq: Three times a day (TID) | INTRAMUSCULAR | Status: DC | PRN

## 2022-08-03 NOTE — Progress Notes (Signed)
Neurology Progress Note  Subjective: -Sleeping, awakens more easily -Reports he thinks CBD gummies he takes to relax may be triggering events, or possibly missed evening BP meds (amlodipine/clonidine/metoprolol/hydroxyzine)  Exam: Vitals:   08/03/22 0319 08/03/22 0745  BP: 91/63 120/71  Pulse: 72   Resp: 19   Temp: 98.2 F (36.8 C) 98.1 F (36.7 C)  SpO2: 100%    Gen: In bed, comfortable, sleepy Resp: non-labored breathing, no grossly audible wheezing Cardiac: Perfusing extremities well  Abd: soft, nt  Neuro: MS: Sleeping but awakens easily, fluent speech, oriented to situation and gives good history CN: Face symmetric, tracks examiner, PERRL Motor: No abnormal movements   Pertinent Labs:  Basic Metabolic Panel: Recent Labs  Lab 08/01/22 0759 08/01/22 0816 08/02/22 0521 08/03/22 0325  NA 134* 135 135 140  K 4.2 4.3 4.4 4.7  CL 93* 97* 93* 100  CO2 25  --  27 24  GLUCOSE 89 86 87 74  BUN 27* 34* 36* 50*  CREATININE 9.29* 10.00* 11.12* 13.58*  CALCIUM 9.4  --  9.2 8.8*  MG  --   --  3.1*  --   PHOS  --   --  4.1  --      CBC: Recent Labs  Lab 08/01/22 0759 08/01/22 0816 08/02/22 0521 08/03/22 0325  WBC 6.0  --  8.7 7.1  NEUTROABS  --   --  5.6  --   HGB 9.9* 11.2* 9.7* 8.8*  HCT 31.1* 33.0* 29.2* 27.9*  MCV 96.3  --  97.7 99.3  PLT 237  --  215 189     Coagulation Studies: No results for input(s): "LABPROT", "INR" in the last 72 hours.   MRI brain and C-spine personally reviewed, agree with radiology these are negative studies  UDS not collected this admission due to limited urine output  Impression:  37 year old male with recurrent episodes of abnormal movements in the setting of hypertension and end-stage renal disease. Perhaps triggered by CBD or missed clonidine. Imaging reassuring against structural component  Recommendations: -Medication adherence -Avoidance of CBD / THC in the future  -If events recur in the future despite medication  adherence and avoidance of THC/CBD, consider neurology referral at that time -In patient neurology will be available as needed, please reach out with any questions/concerns  Brooke Dare MD-PhD [facility] [phone]  Available 7 AM to 7 PM, outside these hours please contact Neurologist on call listed on AMION   >25 min spent in care of patient, majority at bedside

## 2022-08-03 NOTE — Progress Notes (Addendum)
PROGRESS NOTE                                                                                                                                                                                                             Patient Demographics:    Samuel Burns, is a 37 y.o. male, DOB - 10-Jul-1985, ZOX:[id number]  Outpatient Primary MD for the patient is Marcine Matar, MD    LOS - 1  Admit date - 08/02/2022    Chief Complaint  Patient presents with   Spasms       Brief Narrative (HPI from H&P)   37 y.o. male with medical history significant of ESRD on HD (M/W/F) secondary to HTN,  HTN, ACD, depression who presented to ED with complaints of involuntary movements.  He was reportedly seen for similar episode of involuntary movements present 1 month ago followed by another episode yesterday, for which she was seen in the ED and discharged home on 08/01/22.  He returned back to the emergency department this evening for additional episode of involuntary movements, during which time he remained conscious.  He was admitted for further workup for involuntary myoclonic jerks.   Subjective:    [name] today has, No headache, No chest pain, No abdominal pain - No Nausea, No new weakness tingling or numbness, no SOB, no jerks.   Assessment  & Plan :    Current involuntary myoclonic jerking.  Unclear etiology, initial workup which includes MRI brain, MRI C-spine, TSH unremarkable, seen by neurology, with supportive care all symptoms resolved within 24 hours, he does use a lot of CBD/marijuana and this could be a side effect of that combined with his underlying ESRD.  He has been counseled to abstain from any recreational drug use including CBD or marijuana use.  Case discussed with neurology on 08/03/2022, no further workup per neurology.  Advance activity and monitor if no further   ESRD (end stage renal disease) on dialysis [facility]) -  Dialysis MWF.  Nephrology on board.  Mild major depression (HCC) - Follow up on MAR. Zoloft/wellbutrin in his chart.  Reintroduce home medications with caution and monitor.  Look for any side effects.  Wellbutrin discontinued as known to cause myoclonic jerks.  Essential hypertension - stable will continue to monitor.  Anemia of chronic disease  - Baseline around 10-11,  stable.        Condition - Fair  Family Communication  :  None  Code Status :  Full  Consults  :  Renal, Neuro  PUD Prophylaxis :    Procedures  :     MRI brain and C-spine.  No acute findings, nonspecific MRI brain changes noted.    CT head.  Nonacute.      Disposition Plan  :    Status is: Inpatient   DVT Prophylaxis  :  Heparin  SCDs Start: 08/02/22 0823 SCDs Start: 08/02/22 4098    Lab Results  Component Value Date   PLT 189 08/03/2022    Diet :  Diet Order             Diet regular Room service appropriate? Yes; Fluid consistency: Thin  Diet effective now                    Inpatient Medications  Scheduled Meds:  calcitRIOL  0.75 mcg Oral Q M,W,F   Chlorhexidine Gluconate Cloth  6 each Topical Q0600   cinacalcet  60 mg Oral Q M,W,F   sucroferric oxyhydroxide  1,000 mg Oral TID WC   Continuous Infusions: PRN Meds:.acetaminophen **OR** acetaminophen, hydrALAZINE, ondansetron **OR** ondansetron (ZOFRAN) IV  Antibiotics  :    Anti-infectives (From admission, onward)    None         Objective:   Vitals:   08/03/22 0000 08/03/22 0319 08/03/22 0500 08/03/22 0745  BP: 98/66 91/63  120/71  Pulse: 68 72  79  Resp: 20 19  13   Temp: 98.3 F (36.8 C) 98.2 F (36.8 C)  98.1 F (36.7 C)  TempSrc: Oral Oral  Oral  SpO2: 98% 100%  100%  Weight:   82 kg   Height:        Wt Readings from Last 3 Encounters:  08/03/22 82 kg  08/01/22 81.6 kg  02/27/22 81.7 kg    No intake or output data in the 24 hours ending 08/03/22 0931   Physical Exam  Awake Alert, No new  F.N deficits, Normal affect [place].AT,PERRAL Supple Neck, No JVD,   Symmetrical Chest wall movement, Good air movement bilaterally, CTAB RRR,No Gallops,Rubs or new Murmurs,  +ve B.Sounds, Abd Soft, No tenderness,   No Cyanosis, Clubbing or edema        Data Review:    Recent Labs  Lab 08/01/22 0759 08/01/22 0816 08/02/22 0521 08/03/22 0325  WBC 6.0  --  8.7 7.1  HGB 9.9* 11.2* 9.7* 8.8*  HCT 31.1* 33.0* 29.2* 27.9*  PLT 237  --  215 189  MCV 96.3  --  97.7 99.3  MCH 30.7  --  32.4 31.3  MCHC 31.8  --  33.2 31.5  RDW 16.0*  --  16.4* 16.0*  LYMPHSABS  --   --  1.9  --   MONOABS  --   --  0.8  --   EOSABS  --   --  0.3  --   BASOSABS  --   --  0.1  --     Recent Labs  Lab 08/01/22 0759 08/01/22 0816 08/01/22 1003 08/02/22 0521 08/02/22 0918 08/02/22 2103 08/03/22 0325  NA 134* 135  --  135  --   --  140  K 4.2 4.3  --  4.4  --   --  4.7  CL 93* 97*  --  93*  --   --  100  CO2 25  --   --  27  --   --  24  ANIONGAP 16*  --   --  15  --   --  16*  GLUCOSE 89 86  --  87  --   --  74  BUN 27* 34*  --  36*  --   --  50*  CREATININE 9.29* 10.00*  --  11.12*  --   --  13.58*  AST 14*  --   --  12*  --   --   --   ALT 12  --   --  12  --   --   --   ALKPHOS 94  --   --  97  --   --   --   BILITOT 0.4  --   --  0.5  --   --   --   ALBUMIN 4.3  --   --  4.2  --   --   --   LATICACIDVEN  --   --  1.1  --   --   --   --   TSH  --   --   --   --  1.020  --   --   AMMONIA  --   --   --   --   --  53*  --   MG  --   --   --  3.1*  --   --   --   CALCIUM 9.4  --   --  9.2  --   --  8.8*      Recent Labs  Lab 08/01/22 0759 08/01/22 1003 08/02/22 0521 08/02/22 0918 08/02/22 2103 08/03/22 0325  LATICACIDVEN  --  1.1  --   --   --   --   TSH  --   --   --  1.020  --   --   AMMONIA  --   --   --   --  53*  --   MG  --   --  3.1*  --   --   --   CALCIUM 9.4  --  9.2  --   --  8.8*     Recent Labs    08/02/22 0918  TSH 1.020   Radiology Reports MR CERVICAL SPINE  WO CONTRAST  Result Date: 08/02/2022 CLINICAL DATA:  Myelopathy, acute. EXAM: MRI CERVICAL SPINE WITHOUT CONTRAST TECHNIQUE: Multiplanar, multisequence MR imaging of the cervical spine was performed. No intravenous contrast was administered. COMPARISON:  None Available. FINDINGS: Alignment: No significant listhesis is present. Straightening of the normal lumbar lordosis is present. Vertebrae: Marrow signal and vertebral body heights are normal. Cord: Vocal cords are midline and symmetric.  Trachea is clear. Posterior Fossa, vertebral arteries, paraspinal tissues: Negative. Disc levels: No significant focal disc disease or stenosis is present. IMPRESSION: Negative MRI of the cervical spine. Electronically Signed   By: Marin Roberts M.D.   On: 08/02/2022 11:59   MR BRAIN WO CONTRAST  Result Date: 08/02/2022 CLINICAL DATA:  Deficit, neuro degenerative disorder suspected. Stroke suspected. Seizure like activity. EXAM: MRI HEAD WITHOUT CONTRAST TECHNIQUE: Multiplanar, multiecho pulse sequences of the brain and surrounding structures were obtained without intravenous contrast. COMPARISON:  None Available. FINDINGS: Brain: No acute infarct, hemorrhage, or mass lesion is present. Periventricular T2 hyperintensities are moderately advanced for age. A remote subcortical hemorrhagic infarct is present in the posterior right parietal lobe. The white matter changes emanate from the corpus callosum. The ventricles are of normal  size. No significant extraaxial fluid collection is present. The brainstem and cerebellum are within normal limits. The internal auditory canals are within normal limits. Midline structures are within normal limits. Dedicated imaging of the temporal lobes demonstrates no focal mass lesion. Hippocampal structures are symmetric in size and signal. Vascular: Flow is present in the major intracranial arteries. Skull and upper cervical spine: The craniocervical junction is normal. Upper cervical  spine is within normal limits. Marrow signal is unremarkable. Sinuses/Orbits: The right frontal sinus is opacified. The paranasal sinuses and mastoid air cells are otherwise clear. The globes and orbits are within normal limits. IMPRESSION: 1. No acute intracranial abnormality. 2. Periventricular T2 hyperintensities are moderately advanced for age. The finding is nonspecific but can be seen in the setting of chronic microvascular ischemia, a demyelinating process such as multiple sclerosis, vasculitis, complicated migraine headaches, or as the sequelae of a prior infectious or inflammatory process. 3. 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 Electronically Signed   By: Marin Roberts M.D.   On: 08/02/2022 11:53   CT Head Wo Contrast  Result Date: 08/01/2022 CLINICAL DATA:  Mental status changes. EXAM: CT HEAD WITHOUT CONTRAST TECHNIQUE: Contiguous axial images were obtained from the base of the skull through the vertex without intravenous contrast. RADIATION DOSE REDUCTION: This exam was performed according to the departmental dose-optimization program which includes automated exposure control, adjustment of the mA and/or kV according to patient size and/or use of iterative reconstruction technique. COMPARISON:  None Available. FINDINGS: Brain: No evidence of acute infarction, hemorrhage, hydrocephalus, extra-axial collection or mass lesion/mass effect. Vascular: No hyperdense vessel or unexpected calcification. Skull: Normal. Negative for fracture or focal lesion. Sinuses/Orbits: Opacification of the right frontal sinus. Remainder the paranasal sinuses well aerated. Mastoid air cells unremarkable. Orbits unremarkable. Other: None. IMPRESSION: 1. No acute intracranial abnormality. 2. Right frontal sinus disease. Electronically Signed   By: Annia Belt M.D.   On: 08/01/2022 09:21      Signature  -   Susa Raring M.D on 08/03/2022 at 9:31 AM   -  To page go to  [email or web address]

## 2022-08-03 NOTE — Progress Notes (Signed)
[facility] Progress Note   Subjective: Seen in room --no further jerking movements. Brain MRI negative. Feels well today. For dialysis today, likely discharge afterwards.   Objective Vitals:   08/03/22 0000 08/03/22 0319 08/03/22 0500 08/03/22 0745  BP: 98/66 91/63  120/71  Pulse: 68 72  79  Resp: 20 19  13   Temp: 98.3 F (36.8 C) 98.2 F (36.8 C)  98.1 F (36.7 C)  TempSrc: Oral Oral  Oral  SpO2: 98% 100%  100%  Weight:   82 kg   Height:         Additional Objective Labs: Basic Metabolic Panel: Recent Labs  Lab 08/01/22 0759 08/01/22 0816 08/02/22 0521 08/03/22 0325  NA 134* 135 135 140  K 4.2 4.3 4.4 4.7  CL 93* 97* 93* 100  CO2 25  --  27 24  GLUCOSE 89 86 87 74  BUN 27* 34* 36* 50*  CREATININE 9.29* 10.00* 11.12* 13.58*  CALCIUM 9.4  --  9.2 8.8*  PHOS  --   --  4.1  --    CBC: Recent Labs  Lab 08/01/22 0759 08/01/22 0816 08/02/22 0521 08/03/22 0325  WBC 6.0  --  8.7 7.1  NEUTROABS  --   --  5.6  --   HGB 9.9* 11.2* 9.7* 8.8*  HCT 31.1* 33.0* 29.2* 27.9*  MCV 96.3  --  97.7 99.3  PLT 237  --  215 189   Blood Culture    Component Value Date/Time   SDES BLOOD BLOOD LEFT HAND 08/01/2022 1130   SPECREQUEST  08/01/2022 1130    BOTTLES DRAWN AEROBIC AND ANAEROBIC Blood Culture adequate volume   CULT  08/01/2022 1130    NO GROWTH 2 DAYS Performed at [facility], [address], [place], [place] [address]    REPTSTATUS PENDING 08/01/2022 1130     Physical Exam General: Well appearing, nad  Heart: RRR Lungs: Clear bilaterally  Abdomen: soft non -tender Extremities: No LE edema  Dialysis Access: [facility]   Medications:   calcitRIOL  0.75 mcg Oral Q M,W,F   Chlorhexidine Gluconate Cloth  6 each Topical Q0600   cinacalcet  60 mg Oral Q M,W,F   heparin injection (subcutaneous)  5,000 Units Subcutaneous Q8H   metoprolol tartrate  50 mg Oral BID   sertraline  100 mg Oral q AM   sucroferric oxyhydroxide  1,000 mg Oral TID WC     Dialysis Orders:  MWF [facility]  4h   400/1.5   82.5kg  2/2 bath  TDC    Hepain 5000 + - last OP HD 6/14, post wt 82.1kg - sensipar 60mg  po three times per week - rocaltrol 0.75 mcg po three times per week - mircera 50 mcg IV q 2 wks, last 6/5, due 6/19  Assessment/Plan: Involuntary body movements - happening periodically, lasting for 45- 60 minutes. Seen by neurology. Although myoclonic jerking can be seen in ESRD pts, it would be unusual to see this in a patient who doesn't miss dialysis and has very good clearance labs. Also, the periodic nature of this pt's issue is not typical for what can be seen at times in the under-dialyzed esrd population. MRI negative. Has resolved today . Felt to be medication related- Abstain from recreation drugs, Wellbutrin discontinued as well.  ESRD - on HD MWF. Last HD was 6/14. HD today.  HTN/ volume - coming off OP HD at dry wt, euvolemic on exam.  Plan UF to dry  wt next HD.  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 Follow.  MBD ckd - CCa and phos are in range. Cont po vdra, sensipar and  Tomasa Blase PA-C [facility] 08/03/2022,11:29 AM

## 2022-08-03 NOTE — TOC CM/SW Note (Signed)
Transition of Care [facility]) - Inpatient Brief Assessment   Patient Details  Name: Samuel Burns MRN: [id number] Date of Birth: 20-Dec-1985  Transition of Care [facility]) CM/SW Contact:    Gordy Clement, RN Phone Number: 08/03/2022, 11:27 AM   Clinical Narrative:  Patient from home with Family.  Patient received HD on M/W/F at [facility]. He drives himself to HD . Patient soes not have any DME at home nor has he ever had home health. Family will transport home at DC. TOC will continue to follow patient for any additional discharge needs        Transition of Care Asessment: Insurance and Status: Insurance coverage has been reviewed Patient has primary care physician: Yes Laural Benes, Binnie Rail, MD) Home environment has been reviewed: From Home. Lives with Son's Grandmother Prior level of function:: Somewhat independent Prior/Current Home Services: No current home services Social Determinants of Health Reivew: SDOH reviewed no interventions necessary Readmission risk has been reviewed: Yes (19%) Transition of care needs: no transition of care needs at this time

## 2022-08-03 NOTE — Progress Notes (Signed)
OT Cancellation Note  Patient Details Name: Samuel Burns MRN: [id number] DOB: 12-06-1985   Cancelled Treatment:    Reason Eval/Treat Not Completed: OT screened, no needs identified, will sign off (Discussed with PT.)  [facility] 08/03/2022, 11:47 AM Luisa Dago, OT/L   Acute OT Clinical Specialist Acute Rehabilitation Services Pager [phone] Office [phone]

## 2022-08-03 NOTE — Evaluation (Signed)
Physical Therapy Evaluation and Discharge Patient Details Name: Samuel Burns MRN: [id number] DOB: April 29, 1985 Today's Date: 08/03/2022  History of Present Illness  Pt is a 36 y.o. M who presents 08/02/2022 with involuntary body movements. Felt to be medication related. Significant PMH: ESRD on HD (MWF), HTN, ACD, depression.  Clinical Impression  Patient evaluated by Physical Therapy with no further acute PT needs identified. PTA, pt lives with his family and is independent; drives self to HD. Pt seems to be close to his functional baseline. Mild dysmetria with BUE's on finger to nose assessment. Pt ambulating 200 ft with no assistive device and negotiated a half flight of stairs independently. All education has been completed and the patient has no further questions. No follow-up Physical Therapy or equipment needs. PT is signing off. Thank you for this referral.      Recommendations for follow up therapy are one component of a multi-disciplinary discharge planning process, led by the attending physician.  Recommendations may be updated based on patient status, additional functional criteria and insurance authorization.  Follow Up Recommendations       Assistance Recommended at Discharge None  Patient can return home with the following       Equipment Recommendations Other (comment)  Recommendations for Other Services       Functional Status Assessment Patient has not had a recent decline in their functional status     Precautions / Restrictions Precautions Precautions: Fall Restrictions Weight Bearing Restrictions: No      Mobility  Bed Mobility Overal bed mobility: Independent                  Transfers Overall transfer level: Independent Equipment used: None                    Ambulation/Gait Ambulation/Gait assistance: Independent Gait Distance (Feet): 200 Feet Assistive device: None Gait Pattern/deviations: WFL(Within Functional Limits)           Stairs Stairs: Yes Stairs assistance: Modified independent (Device/Increase time) Stair Management: One rail Left Number of Stairs: 12 General stair comments: step over step pattern  Wheelchair Mobility    Modified Rankin (Stroke Patients Only)       Balance Overall balance assessment: No apparent balance deficits (not formally assessed)                                           Pertinent Vitals/Pain Pain Assessment Pain Assessment: No/denies pain    Home Living Family/patient expects to be discharged to:: Private residence Living Arrangements: Other relatives (pt son, pt son's sister, pt mother) Available Help at Discharge: Family Type of Home: House Home Access: Level entry     Alternate Level Stairs-Number of Steps:  (flight) Home Layout: Two level Home Equipment: None      Prior Function Prior Level of Function : Independent/Modified Independent                     Hand Dominance   Dominant Hand: Right    Extremity/Trunk Assessment   Upper Extremity Assessment Upper Extremity Assessment: RUE deficits/detail;LUE deficits/detail RUE Deficits / Details: Mild dysmetric with finger to nose LUE Deficits / Details: Same as RUE    Lower Extremity Assessment Lower Extremity Assessment: Overall WFL for tasks assessed;RLE deficits/detail;LLE deficits/detail RLE Coordination: WNL LLE Coordination: WNL    Cervical / Trunk Assessment Cervical /  Trunk Assessment: Normal  Communication   Communication: No difficulties  Cognition Arousal/Alertness: Awake/alert Behavior During Therapy: Flat affect Overall Cognitive Status: Within Functional Limits for tasks assessed                                          General Comments      Exercises     Assessment/Plan    PT Assessment Patient does not need any further PT services  PT Problem List         PT Treatment Interventions      PT Goals (Current goals  can be found in the Care Plan section)  Acute Rehab PT Goals Patient Stated Goal: did not state PT Goal Formulation: All assessment and education complete, DC therapy    Frequency       Co-evaluation               AM-PAC PT "6 Clicks" Mobility  Outcome Measure Help needed turning from your back to your side while in a flat bed without using bedrails?: None Help needed moving from lying on your back to sitting on the side of a flat bed without using bedrails?: None Help needed moving to and from a bed to a chair (including a wheelchair)?: None Help needed standing up from a chair using your arms (e.g., wheelchair or bedside chair)?: None Help needed to walk in hospital room?: None Help needed climbing 3-5 steps with a railing? : None 6 Click Score: 24    End of Session Equipment Utilized During Treatment: Gait belt Activity Tolerance: Patient tolerated treatment well Patient left: in bed;with call bell/phone within reach Nurse Communication: Mobility status PT Visit Diagnosis: Unsteadiness on feet (R26.81)    Time: 1129-1140 PT Time Calculation (min) (ACUTE ONLY): 11 min   Charges:   PT Evaluation $PT Eval Low Complexity: 1 Low          Lillia Pauls, PT, DPT Acute Rehabilitation Services Office [phone]   Norval Morton 08/03/2022, 11:51 AM

## 2022-08-04 DIAGNOSIS — G253 Myoclonus: Secondary | ICD-10-CM | POA: Diagnosis not present

## 2022-08-04 LAB — CULTURE, BLOOD (ROUTINE X 2): Culture: NO GROWTH

## 2022-08-04 LAB — HEPATITIS B SURFACE ANTIBODY, QUANTITATIVE: Hep B S AB Quant (Post): 347 m[IU]/mL (ref >9.9)

## 2022-08-04 MED ORDER — HEPARIN SODIUM (PORCINE) 1000 UNIT/ML IJ SOLN
INTRAMUSCULAR | Status: AC
  Filled 2022-08-04: qty 2

## 2022-08-04 NOTE — Plan of Care (Signed)
[facility] Patient Discharge Orders- [facility] CLINIC: [facility]  Patient's name: Tadeusz Nealy Admit/DC Dates: 08/02/2022 - 08/04/2022  Discharge Diagnoses: Abnormal involuntary movements -resolved. Neuro w/u negative   Aranesp: Given: --   Date and amount of last dose: --  Last Hgb: 8.8 PRBC's Given: -- Date/# of units: -- ESA dose for discharge: Mircera 75 mcg IV q 2 weeks --Due 6/19 IV Iron dose at discharge: --  Heparin change: --  EDW Change: No change New EDW:   Bath Change: --  Access intervention/Change: -- Details:  Hectorol/Calcitriol change: --  Discharge Labs: Calcium 8.8 Phosphorus -- Albumin -- K+ 4.7  IV Antibiotics: -- Details:  On Coumadin?: -- Last INR: Next INR: Managed By:   OTHER/APPTS/LAB ORDERS:    D/C Meds to be reconciled by nurse after every discharge.  Completed By: Tomasa Blase PA-C [facility] 08/04/2022,2:56 PM   Reviewed by: MD:______ RN_______

## 2022-08-04 NOTE — Progress Notes (Signed)
Pt is back from HD. No significant complaints made. He said he is just tired and sleepy. Vital signs taken and recorded. Went back to sleep.

## 2022-08-04 NOTE — Progress Notes (Signed)
D/C order noted. Contacted [facility] to advise clinic of pt's d/c today and that pt should resume care tomorrow.  Earlie Schank Renal Navigator [phone]   

## 2022-08-04 NOTE — TOC Transition Note (Signed)
Transition of Care [facility]) - CM/SW Discharge Note   Patient Details  Name: Samuel Burns MRN: [id number] Date of Birth: [birth year]  [facility]) CM/SW Contact:  Gordy Clement, RN Phone Number: 08/04/2022, 7:42 AM   Clinical Narrative:     Patient to dc to home today.  No PT/OT or DME needs. Family to transport          Patient Goals and CMS Choice      Discharge Placement                         Discharge Plan and Services Additional resources added to the After Visit Summary for                                       Social Determinants of Health (SDOH) Interventions SDOH Screenings   Food Insecurity: Food Insecurity Present (08/02/2022)  Housing: Low Risk  (08/02/2022)  Transportation Needs: No Transportation Needs (08/02/2022)  Utilities: Not At Risk (08/02/2022)  Depression (PHQ2-9): Medium Risk (02/26/2022)  Tobacco Use: Low Risk  (08/02/2022)     Readmission Risk Interventions     No data to display

## 2022-08-04 NOTE — Discharge Summary (Signed)
Samuel Burns ZOX:[id number] DOB: [birth year] DOA: 08/02/2022  PCP: Marcine Matar, MD  Admit date: 08/02/2022  Discharge date: 08/04/2022  Admitted From: Home   Disposition:  Home   Recommendations for Outpatient Follow-up:   Follow up with PCP in 1-2 weeks  PCP Please obtain BMP/CBC, 2 view CXR in 1week,  (see Discharge instructions)   PCP Please follow up on the following pending results: Check B12, homocysteine, methylmalonic acid levels, needs outpatient neurology follow-up.  Wellbutrin has been discontinued.   Home Health: None   Equipment/Devices: None  Consultations: Neuro, Renal Discharge Condition: Stable    CODE STATUS: Full    Diet Recommendation: Renal diet with 1.2 L fluid restriction.   Chief Complaint  Patient presents with   Spasms     Brief history of present illness from the day of admission and additional interim summary    37 y.o. male with medical history significant of ESRD on HD (M/W/F) secondary to HTN,  HTN, ACD, depression who presented to ED with complaints of involuntary movements.  He was reportedly seen for similar episode of involuntary movements present 1 month ago followed by another episode yesterday, for which she was seen in the ED and discharged home on 08/01/22.  He returned back to the emergency department this evening for additional episode of involuntary movements, during which time he remained conscious.  He was admitted for further workup for involuntary myoclonic jerks.                                                                  Hospital Course   Current involuntary myoclonic jerking.  Unclear etiology, initial workup which includes MRI brain, MRI C-spine, TSH unremarkable, seen by neurology, with supportive care all symptoms resolved within 24 hours, he does  use a lot of CBD/marijuana and this could be a side effect of that combined with his underlying ESRD.  He has been counseled to abstain from any recreational drug use including CBD or marijuana use.    Case discussed with neurology on 08/03/2022, no further workup per neurology.  Advance activity and monitor if no further, note homocysteine levels ordered by neurology was slightly elevated request PCP to monitor B12, homocysteine and methylmalonic acid levels.  Outpatient neurology follow-up also recommended in 1 to 2 weeks postdischarge for nonspecific MRI findings and mildly elevated methylmalonic acid levels.  He is currently symptom-free eager to go home.   ESRD (end stage renal disease) on dialysis [facility]) - Dialysis MWF.  Nephrology on board.  Got dialyzed earlier this morning.  Again symptom-free eager to go home.   Mild major depression (HCC) - Follow up on MAR. Zoloft/wellbutrin in his chart.  Reintroduce home medications with caution and monitor.  Look for any side effects.  Wellbutrin discontinued as known to  cause myoclonic jerks.   Essential hypertension - stable, resume home regimen unchanged.   Anemia of chronic disease  - Baseline around 10-11, stable.    Discharge diagnosis     Principal Problem:   Myoclonus Active Problems:   ESRD (end stage renal disease) on dialysis (HCC)   Mild major depression (HCC)   Essential hypertension   Anemia of chronic disease   Involuntary movements    Discharge instructions    Discharge Instructions     Discharge instructions   Complete by: As directed    Follow with Primary MD Marcine Matar, MD in 7 days   Get CBC, CMP, B12, methylmalonic acid, homocystine levels -  checked next visit with your primary MD   Activity: As tolerated with Full fall precautions use walker/cane & assistance as needed  Disposition Home    Diet: Renal diet with strict 1.2 L fluid restriction per day.  Lit/day fluid restriction.  Special  Instructions: If you have smoked or chewed Tobacco  in the last 2 yrs please stop smoking, stop any regular Alcohol  and or any Recreational drug use.  On your next visit with your primary care physician please Get Medicines reviewed and adjusted.  Please request your Prim.MD to go over all Hospital Tests and Procedure/Radiological results at the follow up, please get all Hospital records sent to your Prim MD by signing hospital release before you go home.  If you experience worsening of your admission symptoms, develop shortness of breath, life threatening emergency, suicidal or homicidal thoughts you must seek medical attention immediately by calling 911 or calling your MD immediately  if symptoms less severe.  You Must read complete instructions/literature along with all the possible adverse reactions/side effects for all the Medicines you take and that have been prescribed to you. Take any new Medicines after you have completely understood and accpet all the possible adverse reactions/side effects.   Increase activity slowly   Complete by: As directed        Discharge Medications   Allergies as of 08/04/2022   No Known Allergies      Medication List     STOP taking these medications    buPROPion 150 MG 24 hr tablet Commonly known as: WELLBUTRIN XL       TAKE these medications    acetaminophen 500 MG tablet Commonly known as: TYLENOL Take 500-1,000 mg by mouth every 6 (six) hours as needed for moderate pain.   amLODipine 10 MG tablet Commonly known as: NORVASC Take 1 tablet (10 mg total) by mouth at bedtime.   calcitRIOL 0.5 MCG capsule Commonly known as: ROCALTROL Take 0.5 mcg by mouth every Monday, Wednesday, and Friday.   CLEAR EYES OP Place 1 drop into both eyes 2 (two) times daily as needed (dry/irritated eyes.).   cloNIDine 0.1 MG tablet Commonly known as: CATAPRES Take 1 tablet (0.1 mg total) by mouth 2 (two) times daily.   Dialyvite 800-Zinc 15 0.8 MG  Tabs Take 1 tablet by mouth daily.   fluticasone 50 MCG/ACT nasal spray Commonly known as: FLONASE Place 2 sprays into both nostrils daily. What changed: when to take this   hydrOXYzine 25 MG capsule Commonly known as: VISTARIL Take 1 capsule (25 mg total) by mouth in the morning and at bedtime.   losartan 50 MG tablet Commonly known as: COZAAR Take 1 tablet (50 mg total) by mouth in the morning.   MELATONIN PO Take 1-2 tablets by mouth at bedtime as needed (  sleep).   Metoprolol Tartrate 75 MG Tabs TAKE 1 TABLET BY MOUTH TWICE DAILY   MIRCERA IJ Mircera   sertraline 100 MG tablet Commonly known as: ZOLOFT Take 1 tablet (100 mg total) by mouth in the morning.   sildenafil 50 MG tablet Commonly known as: Viagra 1 tab PO 1/2 hr prior to sex PRN.  Limit use to 1 tab/24 hr   sucroferric oxyhydroxide 500 MG chewable tablet Commonly known as: VELPHORO Chew 500-1,000 mg by mouth See admin instructions. Take 2 tablets (1000 mg) by mouth 3 times daily with meals & take 1 tablet (500 mg) by mouth with snacks.         Follow-up Information     Marcine Matar, MD. Schedule an appointment as soon as possible for a visit in 1 week(s).   Specialty: Internal Medicine Contact information: [address] [place] [address] [place] [address] [phone]         [facility]. Schedule an appointment as soon as possible for a visit in 1 week(s).   Contact information: [address]     [address] [place] [address] [phone]                Major procedures and Radiology Reports - PLEASE review detailed and final reports thoroughly  -     MR CERVICAL SPINE WO CONTRAST  Result Date: 08/02/2022 CLINICAL DATA:  Myelopathy, acute. EXAM: MRI CERVICAL SPINE WITHOUT CONTRAST TECHNIQUE: Multiplanar, multisequence MR imaging of the cervical spine was performed. No intravenous contrast was administered. COMPARISON:  None Available.  FINDINGS: Alignment: No significant listhesis is present. Straightening of the normal lumbar lordosis is present. Vertebrae: Marrow signal and vertebral body heights are normal. Cord: Vocal cords are midline and symmetric.  Trachea is clear. Posterior Fossa, vertebral arteries, paraspinal tissues: Negative. Disc levels: No significant focal disc disease or stenosis is present. IMPRESSION: Negative MRI of the cervical spine. Electronically Signed   By: Marin Roberts M.D.   On: 08/02/2022 11:59   MR BRAIN WO CONTRAST  Result Date: 08/02/2022 CLINICAL DATA:  Deficit, neuro degenerative disorder suspected. Stroke suspected. Seizure like activity. EXAM: MRI HEAD WITHOUT CONTRAST TECHNIQUE: Multiplanar, multiecho pulse sequences of the brain and surrounding structures were obtained without intravenous contrast. COMPARISON:  None Available. FINDINGS: Brain: No acute infarct, hemorrhage, or mass lesion is present. Periventricular T2 hyperintensities are moderately advanced for age. A remote subcortical hemorrhagic infarct is present in the posterior right parietal lobe. The white matter changes emanate from the corpus callosum. The ventricles are of normal size. No significant extraaxial fluid collection is present. The brainstem and cerebellum are within normal limits. The internal auditory canals are within normal limits. Midline structures are within normal limits. Dedicated imaging of the temporal lobes demonstrates no focal mass lesion. Hippocampal structures are symmetric in size and signal. Vascular: Flow is present in the major intracranial arteries. Skull and upper cervical spine: The craniocervical junction is normal. Upper cervical spine is within normal limits. Marrow signal is unremarkable. Sinuses/Orbits: The right frontal sinus is opacified. The paranasal sinuses and mastoid air cells are otherwise clear. The globes and orbits are within normal limits. IMPRESSION: 1. No acute intracranial  abnormality. 2. Periventricular T2 hyperintensities are moderately advanced for age. The finding is nonspecific but can be seen in the setting of chronic microvascular ischemia, a demyelinating process such as multiple sclerosis, vasculitis, complicated migraine headaches, or as the sequelae of a prior infectious or inflammatory process. 3.  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 Electronically Signed   By: Marin Roberts M.D.   On: 08/02/2022 11:53   CT Head Wo Contrast  Result Date: 08/01/2022 CLINICAL DATA:  Mental status changes. EXAM: CT HEAD WITHOUT CONTRAST TECHNIQUE: Contiguous axial images were obtained from the base of the skull through the vertex without intravenous contrast. RADIATION DOSE REDUCTION: This exam was performed according to the departmental dose-optimization program which includes automated exposure control, adjustment of the mA and/or kV according to patient size and/or use of iterative reconstruction technique. COMPARISON:  None Available. FINDINGS: Brain: No evidence of acute infarction, hemorrhage, hydrocephalus, extra-axial collection or mass lesion/mass effect. Vascular: No hyperdense vessel or unexpected calcification. Skull: Normal. Negative for fracture or focal lesion. Sinuses/Orbits: Opacification of the right frontal sinus. Remainder the paranasal sinuses well aerated. Mastoid air cells unremarkable. Orbits unremarkable. Other: None. IMPRESSION: 1. No acute intracranial abnormality. 2. Right frontal sinus disease. Electronically Signed   By: Annia Belt M.D.   On: 08/01/2022 09:21    Micro Results    Recent Results (from the past 240 hour(s))  Blood culture (routine x 2)     Status: None (Preliminary result)   Collection Time: 08/01/22 11:25 AM   Specimen: BLOOD  Result Value Ref Range Status   Specimen Description BLOOD SITE NOT SPECIFIED  Final   Special Requests   Final    BOTTLES DRAWN AEROBIC AND ANAEROBIC Blood  Culture adequate volume   Culture   Final    NO GROWTH 3 DAYS Performed at [facility], [address] [place], [place] [address]    Report Status PENDING  Incomplete  Blood culture (routine x 2)     Status: None (Preliminary result)   Collection Time: 08/01/22 11:30 AM   Specimen: BLOOD  Result Value Ref Range Status   Specimen Description BLOOD BLOOD LEFT HAND  Final   Special Requests   Final    BOTTLES DRAWN AEROBIC AND ANAEROBIC Blood Culture adequate volume   Culture   Final    NO GROWTH 3 DAYS Performed at [facility], [address] [place], [place] [address]    Report Status PENDING  Incomplete    Today   Subjective    Samuel Burns today has no headache,no chest abdominal pain,no new weakness tingling or numbness, feels much better wants to go home today.    Objective   Blood pressure 97/74, pulse 79, temperature 98 F (36.7 C), temperature source Oral, resp. rate 18, height 5\' 8"  (1.727 m), weight 87.4 kg, SpO2 99 %.   Intake/Output Summary (Last 24 hours) at 08/04/2022 0734 Last data filed at 08/04/2022 0220 Gross per 24 hour  Intake 240 ml  Output 4000 ml  Net -3760 ml    Exam  Awake Alert, No new F.N deficits,    [place].AT,PERRAL Supple Neck,   Symmetrical Chest wall movement, Good air movement bilaterally, CTAB RRR,No Gallops,   +ve B.Sounds, Abd Soft, Non tender,  No Cyanosis, Clubbing or edema    Data Review   Recent Labs  Lab 08/01/22 0759 08/01/22 0816 08/02/22 0521 08/03/22 0325  WBC 6.0  --  8.7 7.1  HGB 9.9* 11.2* 9.7* 8.8*  HCT 31.1* 33.0* 29.2* 27.9*  PLT 237  --  215 189  MCV 96.3  --  97.7 99.3  MCH 30.7  --  32.4 31.3  MCHC 31.8  --  33.2 31.5  RDW 16.0*  --  16.4*  16.0*  LYMPHSABS  --   --  1.9  --   MONOABS  --   --  0.8  --   EOSABS  --   --  0.3  --   BASOSABS  --   --  0.1  --     Recent Labs  Lab 08/01/22 0759 08/01/22 0816 08/01/22 1003 08/02/22 0521 08/02/22 0918 08/02/22 2103 08/03/22 0325   NA 134* 135  --  135  --   --  140  K 4.2 4.3  --  4.4  --   --  4.7  CL 93* 97*  --  93*  --   --  100  CO2 25  --   --  27  --   --  24  ANIONGAP 16*  --   --  15  --   --  16*  GLUCOSE 89 86  --  87  --   --  74  BUN 27* 34*  --  36*  --   --  50*  CREATININE 9.29* 10.00*  --  11.12*  --   --  13.58*  AST 14*  --   --  12*  --   --   --   ALT 12  --   --  12  --   --   --   ALKPHOS 94  --   --  97  --   --   --   BILITOT 0.4  --   --  0.5  --   --   --   ALBUMIN 4.3  --   --  4.2  --   --   --   LATICACIDVEN  --   --  1.1  --   --   --   --   TSH  --   --   --   --  1.020  --   --   AMMONIA  --   --   --   --   --  53*  --   MG  --   --   --  3.1*  --   --   --   CALCIUM 9.4  --   --  9.2  --   --  8.8*    Total Time in preparing paper work, data evaluation and todays exam - 35 minutes  Signature  -    Susa Raring M.D on 08/04/2022 at 7:34 AM   -  To page go to [email or web address]

## 2022-08-04 NOTE — Progress Notes (Addendum)
Received patient in bed to unit.  Alert and oriented.  Informed consent signed and in chart.   TX duration: 3 hours 89m  Patient tolerated well.  Transported back to the room  Alert, without acute distress.  Hand-off given to patient's nurse.   Access used: catheter Access issues: none  Total UF removed: 4000 ml Medication(s) given: none Post HD VS: 115/84 Post HD weight: 83.4 kg     08/04/22 0220  Vitals  Temp 98 F (36.7 C)  Temp Source Oral  BP 115/84  MAP (mmHg) 94  BP Location Left Arm  BP Method Automatic  Patient Position (if appropriate) Lying  Pulse Rate 76  Pulse Rate Source Monitor  ECG Heart Rate 77  Resp 17  Oxygen Therapy  SpO2 100 %  Post Treatment  Dialyzer Clearance Clear  Duration of HD Treatment -hour(s) 3.45 hour(s)  Hemodialysis Intake (mL) 0 mL  Liters Processed 88.2  Fluid Removed (mL) 4 mL  Tolerated HD Treatment Yes  Post-Hemodialysis Comments tx completed as scheduled, tolerated well, no complaints.  AVG/AVF Arterial Site Held (minutes) 0 minutes  AVG/AVF Venous Site Held (minutes) 0 minutes  Note  Observations pt is in bed resting, alert, oriented, verbally responsive, no acu  Hemodialysis Catheter Left Subclavian  Placement Date/Time: 08/02/22 (c) 2046   Placed prior to admission: Yes  Orientation: Left  Access Location: Subclavian  Site Condition No complications  Blue Lumen Status Heparin locked;Dead end cap in place  Red Lumen Status Flushed;Heparin locked;Dead end cap in place  Purple Lumen Status N/A  Catheter fill solution Heparin 1000 units/ml  Catheter fill volume (Arterial) 2.1 cc  Catheter fill volume (Venous) 2.1  Dressing Type Transparent  Dressing Status Antimicrobial disc in place  Interventions New dressing  Drainage Description None  Post treatment catheter status Capped and Clamped

## 2022-08-04 NOTE — Discharge Instructions (Signed)
Follow with Primary MD Marcine Matar, MD in 7 days   Get CBC, CMP, B12, methylmalonic acid, homocystine levels -  checked next visit with your primary MD   Activity: As tolerated with Full fall precautions use walker/cane & assistance as needed  Disposition Home    Diet: Renal diet with strict 1.2 L fluid restriction per day.  Lit/day fluid restriction.  Special Instructions: If you have smoked or chewed Tobacco  in the last 2 yrs please stop smoking, stop any regular Alcohol  and or any Recreational drug use.  On your next visit with your primary care physician please Get Medicines reviewed and adjusted.  Please request your Prim.MD to go over all Hospital Tests and Procedure/Radiological results at the follow up, please get all Hospital records sent to your Prim MD by signing hospital release before you go home.  If you experience worsening of your admission symptoms, develop shortness of breath, life threatening emergency, suicidal or homicidal thoughts you must seek medical attention immediately by calling 911 or calling your MD immediately  if symptoms less severe.  You Must read complete instructions/literature along with all the possible adverse reactions/side effects for all the Medicines you take and that have been prescribed to you. Take any new Medicines after you have completely understood and accpet all the possible adverse reactions/side effects.

## 2022-08-05 ENCOUNTER — Telehealth: Payer: Self-pay | Admitting: Nephrology

## 2022-08-05 ENCOUNTER — Telehealth: Payer: Self-pay

## 2022-08-05 DIAGNOSIS — Z4800 Encounter for change or removal of nonsurgical wound dressing: Secondary | ICD-10-CM | POA: Diagnosis not present

## 2022-08-05 DIAGNOSIS — D631 Anemia in chronic kidney disease: Secondary | ICD-10-CM | POA: Diagnosis not present

## 2022-08-05 DIAGNOSIS — Z992 Dependence on renal dialysis: Secondary | ICD-10-CM | POA: Diagnosis not present

## 2022-08-05 DIAGNOSIS — N186 End stage renal disease: Secondary | ICD-10-CM | POA: Diagnosis not present

## 2022-08-05 DIAGNOSIS — D689 Coagulation defect, unspecified: Secondary | ICD-10-CM | POA: Diagnosis not present

## 2022-08-05 DIAGNOSIS — N2581 Secondary hyperparathyroidism of renal origin: Secondary | ICD-10-CM | POA: Diagnosis not present

## 2022-08-05 NOTE — Telephone Encounter (Signed)
Transition of care contact from inpatient facility  Date of Discharge: 08/04/22 Date of Contact: 08/05/22 Method of contact: Phone  Attempted to contact patient to discuss transition of care from inpatient admission. Patient did not answer the phone. Message was left on the patient's voicemail. Will try to reach at outpatient dialysis.

## 2022-08-05 NOTE — Transitions of Care (Post Inpatient/ED Visit) (Signed)
   08/05/2022  Name: Jarrin Paeth MRN: [id number] DOB: 1985/11/07  Today's TOC FU Call Status: Today's TOC FU Call Status:: Unsuccessul Call (1st Attempt) Unsuccessful Call (1st Attempt) Date: 08/05/22  Attempted to reach the patient regarding the most recent Inpatient/ED visit.  Follow Up Plan: Additional outreach attempts will be made to reach the patient to complete the Transitions of Care (Post Inpatient/ED visit) call.      Antionette Fairy, RN,BSN,CCM [facility] Care Management Care Management Community Coordinator Direct Phone: [phone] Toll Free: [phone] Fax: [phone]

## 2022-08-06 ENCOUNTER — Telehealth: Payer: Self-pay

## 2022-08-06 LAB — CULTURE, BLOOD (ROUTINE X 2)
Culture: NO GROWTH
Special Requests: ADEQUATE
Special Requests: ADEQUATE

## 2022-08-06 NOTE — Transitions of Care (Post Inpatient/ED Visit) (Signed)
   08/06/2022  Name: Ollie Zamorski MRN: [id number] DOB: [birth year]  Today's TOC FU Call Status: Today's TOC FU Call Status:: Unsuccessful Call (3rd Attempt) Unsuccessful Call (3rd Attempt) Date: 08/06/22  Attempted to reach the patient regarding the most recent Inpatient/ED visit.  Follow Up Plan: No further outreach attempts will be made at this time. We have been unable to contact the patient.     Antionette Fairy, RN,BSN,CCM [facility] Care Management Care Management Community Coordinator Direct Phone: [phone] Toll Free: [phone] Fax: [phone]

## 2022-08-06 NOTE — Transitions of Care (Post Inpatient/ED Visit) (Signed)
   08/06/2022  Name: Harvest Bosman MRN: [id number] DOB: 1985-09-30  Today's TOC FU Call Status: Today's TOC FU Call Status:: Unsuccessful Call (2nd Attempt) Unsuccessful Call (2nd Attempt) Date: 08/06/22  Attempted to reach the patient regarding the most recent Inpatient/ED visit.  Follow Up Plan: Additional outreach attempts will be made to reach the patient to complete the Transitions of Care (Post Inpatient/ED visit) call.      Antionette Fairy, RN,BSN,CCM [facility] Care Management Care Management Community Coordinator Direct Phone: [phone] Toll Free: [phone] Fax: [phone]

## 2022-08-07 DIAGNOSIS — D631 Anemia in chronic kidney disease: Secondary | ICD-10-CM | POA: Diagnosis not present

## 2022-08-07 DIAGNOSIS — N186 End stage renal disease: Secondary | ICD-10-CM | POA: Diagnosis not present

## 2022-08-07 DIAGNOSIS — N2581 Secondary hyperparathyroidism of renal origin: Secondary | ICD-10-CM | POA: Diagnosis not present

## 2022-08-07 DIAGNOSIS — D689 Coagulation defect, unspecified: Secondary | ICD-10-CM | POA: Diagnosis not present

## 2022-08-07 DIAGNOSIS — Z992 Dependence on renal dialysis: Secondary | ICD-10-CM | POA: Diagnosis not present

## 2022-08-07 DIAGNOSIS — Z4800 Encounter for change or removal of nonsurgical wound dressing: Secondary | ICD-10-CM | POA: Diagnosis not present

## 2022-08-07 LAB — VITAMIN B1: Vitamin B1 (Thiamine): 149.3 nmol/L (ref 66.5–200.0)

## 2022-08-10 DIAGNOSIS — D689 Coagulation defect, unspecified: Secondary | ICD-10-CM | POA: Diagnosis not present

## 2022-08-10 DIAGNOSIS — Z992 Dependence on renal dialysis: Secondary | ICD-10-CM | POA: Diagnosis not present

## 2022-08-10 DIAGNOSIS — N2581 Secondary hyperparathyroidism of renal origin: Secondary | ICD-10-CM | POA: Diagnosis not present

## 2022-08-10 DIAGNOSIS — D631 Anemia in chronic kidney disease: Secondary | ICD-10-CM | POA: Diagnosis not present

## 2022-08-10 DIAGNOSIS — N186 End stage renal disease: Secondary | ICD-10-CM | POA: Diagnosis not present

## 2022-08-10 DIAGNOSIS — Z4800 Encounter for change or removal of nonsurgical wound dressing: Secondary | ICD-10-CM | POA: Diagnosis not present

## 2022-08-12 DIAGNOSIS — D689 Coagulation defect, unspecified: Secondary | ICD-10-CM | POA: Diagnosis not present

## 2022-08-12 DIAGNOSIS — N2581 Secondary hyperparathyroidism of renal origin: Secondary | ICD-10-CM | POA: Diagnosis not present

## 2022-08-12 DIAGNOSIS — N186 End stage renal disease: Secondary | ICD-10-CM | POA: Diagnosis not present

## 2022-08-12 DIAGNOSIS — Z992 Dependence on renal dialysis: Secondary | ICD-10-CM | POA: Diagnosis not present

## 2022-08-12 DIAGNOSIS — Z4800 Encounter for change or removal of nonsurgical wound dressing: Secondary | ICD-10-CM | POA: Diagnosis not present

## 2022-08-12 DIAGNOSIS — D631 Anemia in chronic kidney disease: Secondary | ICD-10-CM | POA: Diagnosis not present

## 2022-08-13 ENCOUNTER — Other Ambulatory Visit: Payer: Self-pay | Admitting: Internal Medicine

## 2022-08-14 ENCOUNTER — Other Ambulatory Visit: Payer: Self-pay

## 2022-08-14 DIAGNOSIS — N2581 Secondary hyperparathyroidism of renal origin: Secondary | ICD-10-CM | POA: Diagnosis not present

## 2022-08-14 DIAGNOSIS — Z4800 Encounter for change or removal of nonsurgical wound dressing: Secondary | ICD-10-CM | POA: Diagnosis not present

## 2022-08-14 DIAGNOSIS — Z992 Dependence on renal dialysis: Secondary | ICD-10-CM | POA: Diagnosis not present

## 2022-08-14 DIAGNOSIS — D631 Anemia in chronic kidney disease: Secondary | ICD-10-CM | POA: Diagnosis not present

## 2022-08-14 DIAGNOSIS — N186 End stage renal disease: Secondary | ICD-10-CM | POA: Diagnosis not present

## 2022-08-14 DIAGNOSIS — D689 Coagulation defect, unspecified: Secondary | ICD-10-CM | POA: Diagnosis not present

## 2022-08-16 DIAGNOSIS — Z992 Dependence on renal dialysis: Secondary | ICD-10-CM | POA: Diagnosis not present

## 2022-08-16 DIAGNOSIS — N186 End stage renal disease: Secondary | ICD-10-CM | POA: Diagnosis not present

## 2022-08-16 DIAGNOSIS — I129 Hypertensive chronic kidney disease with stage 1 through stage 4 chronic kidney disease, or unspecified chronic kidney disease: Secondary | ICD-10-CM | POA: Diagnosis not present

## 2022-08-16 MED ORDER — DIALYVITE 800-ZINC 15 0.8 MG PO TABS
1.0000 | ORAL_TABLET | Freq: Every day | ORAL | 1 refills | Status: DC
  Filled 2022-08-16 – 2022-09-14 (×2): qty 100, 100d supply, fill #0
  Filled 2022-10-21: qty 100, fill #0
  Filled 2022-12-21 – 2023-04-27 (×5): qty 100, 100d supply, fill #0
  Filled 2023-07-14: qty 100, fill #0

## 2022-08-17 ENCOUNTER — Other Ambulatory Visit: Payer: Self-pay

## 2022-08-17 DIAGNOSIS — Z4800 Encounter for change or removal of nonsurgical wound dressing: Secondary | ICD-10-CM | POA: Diagnosis not present

## 2022-08-17 DIAGNOSIS — N186 End stage renal disease: Secondary | ICD-10-CM | POA: Diagnosis not present

## 2022-08-17 DIAGNOSIS — N2581 Secondary hyperparathyroidism of renal origin: Secondary | ICD-10-CM | POA: Diagnosis not present

## 2022-08-17 DIAGNOSIS — D689 Coagulation defect, unspecified: Secondary | ICD-10-CM | POA: Diagnosis not present

## 2022-08-17 DIAGNOSIS — D631 Anemia in chronic kidney disease: Secondary | ICD-10-CM | POA: Diagnosis not present

## 2022-08-17 DIAGNOSIS — Z992 Dependence on renal dialysis: Secondary | ICD-10-CM | POA: Diagnosis not present

## 2022-08-17 NOTE — H&P (View-Only) (Signed)
[facility]   ASSESSMENT / PLAN: Samuel Burns is a 37 y.o. right handed male in need of permanent dialysis access. I reviewed options for dialysis in detail with the patient, including hemodialysis and peritoneal dialysis. I counseled the patient to ask their nephrologist about their candidacy for renal transplant. I counseled the patient that dialysis access requires surveillance and periodic maintenance. Plan to proceed with right-sided laparoscopic peritoneal dialysis catheter placement.   CHIEF COMPLAINT: Desires transition to peritoneal dialysis  HISTORY OF PRESENT ILLNESS: Samuel Burns is a 37 y.o. male who presents to clinic for evaluation of permanent dialysis access.  The patient desires peritoneal dialysis.  He is right-handed.  He has previously had hemodialysis access surgery in the arms.  He denies any abdominal surgery in the past.  We reviewed the risk, benefits, and alternatives to peritoneal dialysis.  He is understanding and wants to proceed.  Past Medical History:  Diagnosis Date   ADHD (attention deficit hyperactivity disorder)    ADHD   Anemia    Anxiety    Asthma    as a child   Depression    Hypertension    Renal failure    Dialysis M/W/F    Past Surgical History:  Procedure Laterality Date   AV FISTULA PLACEMENT Right 11/24/2019   Procedure: Creation of RIGHT ARM Brachial/Cephalic ARTERIOVENOUS (AV) FISTULA.;  Surgeon: [name], MD;  Location: [facility];  Service: Vascular;  Laterality: Right;   AV FISTULA PLACEMENT Left 12/22/2021   Procedure: LEFT ARM BRACHIOCEPHALIC ARTERIOVENOUS (AV) FISTULA CREATION;  Surgeon: [name], MD;  Location: [facility];  Service: Vascular;  Laterality: Left;   BASCILIC VEIN TRANSPOSITION Left 01/27/2022   Procedure: LEFT FIRST STAGE BASILIC VEIN TRANSPOSITION;  Surgeon: [name], MD;  Location: [facility];  Service: Vascular;  Laterality: Left;  PERIPHERAL NERVE BLOCK   IR AV DIALY  SHUNT INTRO NEEDLE/INTRACATH INITIAL W/PTA/IMG RIGHT Right 02/11/2021   IR DIALY SHUNT INTRO NEEDLE/INTRACATH INITIAL W/IMG RIGHT Right 04/30/2020   IR FLUORO GUIDE CV LINE LEFT  11/21/2021   IR FLUORO GUIDE CV LINE RIGHT  11/20/2019   IR REMOVAL TUN CV CATH W/O FL  06/04/2020   IR US GUIDE VASC ACCESS LEFT  11/21/2021   IR US GUIDE VASC ACCESS RIGHT  11/20/2019   IR US GUIDE VASC ACCESS RIGHT  04/30/2020   IR US GUIDE VASC ACCESS RIGHT  02/11/2021    Family History  Problem Relation Age of Onset   Hypertension Maternal Grandmother     Social History   Socioeconomic History   Marital status: Single    Spouse name: 1   Number of children: Not on file   Years of education: Not on file   Highest education level: Not on file  Occupational History   Not on file  Tobacco Use   Smoking status: Never    Passive exposure: Never   Smokeless tobacco: Never  Vaping Use   Vaping Use: Former   Quit date: 01/01/2022  Substance and Sexual Activity   Alcohol use: No   Drug use: Not Currently   Sexual activity: Yes  Other Topics Concern   Not on file  Social History Narrative   Not on file   Social Determinants of Health   Financial Resource Strain: Not on file  Food Insecurity: Food Insecurity Present (08/02/2022)   Hunger Vital Sign    Worried About Running Out of Food in the Last Year: Sometimes true  Ran Out of Food in the Last Year: Never true  Transportation Needs: No Transportation Needs (08/02/2022)   PRAPARE - Administrator, Civil Service (Medical): No    Lack of Transportation (Non-Medical): No  Physical Activity: Not on file  Stress: Not on file  Social Connections: Not on file  Intimate Partner Violence: Not At Risk (08/02/2022)   Humiliation, Afraid, Rape, and Kick questionnaire    Fear of Current or Ex-Partner: No    Emotionally Abused: No    Physically Abused: No    Sexually Abused: No    No Known Allergies  Current Outpatient Medications   Medication Sig Dispense Refill   acetaminophen (TYLENOL) 500 MG tablet Take 500-1,000 mg by mouth every 6 (six) hours as needed for moderate pain.     amLODipine (NORVASC) 10 MG tablet Take 1 tablet (10 mg total) by mouth at bedtime. 90 tablet 1   B Complex-C-Zn-Folic Acid (DIALYVITE 800-ZINC 15) 0.8 MG TABS Take 1 tablet by mouth daily. 100 tablet 1   calcitRIOL (ROCALTROL) 0.5 MCG capsule Take 0.5 mcg by mouth every Monday, Wednesday, and Friday.     cloNIDine (CATAPRES) 0.1 MG tablet Take 1 tablet (0.1 mg total) by mouth 2 (two) times daily. 60 tablet 4   fluticasone (FLONASE) 50 MCG/ACT nasal spray Place 2 sprays into both nostrils daily. (Patient taking differently: Place 2 sprays into both nostrils in the morning.) 16 g 0   hydrOXYzine (VISTARIL) 25 MG capsule Take 1 capsule (25 mg total) by mouth in the morning and at bedtime. 30 capsule 4   losartan (COZAAR) 50 MG tablet Take 1 tablet (50 mg total) by mouth in the morning. 30 tablet 4   MELATONIN PO Take 1-2 tablets by mouth at bedtime as needed (sleep).     Methoxy PEG-Epoetin Beta (MIRCERA IJ) Mircera     Metoprolol Tartrate 75 MG TABS TAKE 1 TABLET BY MOUTH TWICE DAILY 180 tablet 0   Naphazoline HCl (CLEAR EYES OP) Place 1 drop into both eyes 2 (two) times daily as needed (dry/irritated eyes.).     sertraline (ZOLOFT) 100 MG tablet Take 1 tablet (100 mg total) by mouth in the morning. 30 tablet 4   sildenafil (VIAGRA) 50 MG tablet 1 tab PO 1/2 hr prior to sex PRN.  Limit use to 1 tab/24 hr 10 tablet 2   sucroferric oxyhydroxide (VELPHORO) 500 MG chewable tablet Chew 500-1,000 mg by mouth See admin instructions. Take 2 tablets (1000 mg) by mouth 3 times daily with meals & take 1 tablet (500 mg) by mouth with snacks.     No current facility-administered medications for this visit.    PHYSICAL EXAM Vitals:   08/18/22 0854  BP: 99/66  Pulse: 80  Resp: 20  Temp: 98.2 F (36.8 C)  SpO2: 100%  Weight: 83.5 kg  Height: 5\' 8"  (1.727  m)    Young man in no acute distress Regular rate and rhythm Unlabored breathing Abdomen soft and nontender.  No significant scarring on the abdomen.  PERTINENT LABORATORY AND RADIOLOGIC DATA  Most recent CBC    Latest Ref Rng & Units 08/03/2022    3:25 AM 08/02/2022    5:21 AM 08/01/2022    8:16 AM  CBC  WBC 4.0 - 10.5 K/uL 7.1  8.7    Hemoglobin 13.0 - 17.0 g/dL 8.8  9.7  40.9   Hematocrit 39.0 - 52.0 % 27.9  29.2  33.0   Platelets 150 - 400 K/uL  189  215       Most recent CMP    Latest Ref Rng & Units 08/03/2022    3:25 AM 08/02/2022    5:21 AM 08/01/2022    8:16 AM  CMP  Glucose 70 - 99 mg/dL 74  87  86   BUN 6 - 20 mg/dL 50  36  34   Creatinine 0.61 - 1.24 mg/dL 40.98  11.91  47.82   Sodium 135 - 145 mmol/L 140  135  135   Potassium 3.5 - 5.1 mmol/L 4.7  4.4  4.3   Chloride 98 - 111 mmol/L 100  93  97   CO2 22 - 32 mmol/L 24  27    Calcium 8.9 - 10.3 mg/dL 8.8  9.2    Total Protein 6.5 - 8.1 g/dL  7.5    Total Bilirubin 0.3 - 1.2 mg/dL  0.5    Alkaline Phos 38 - 126 U/L  97    AST 15 - 41 U/L  12    ALT 0 - 44 U/L  12      Renal function Estimated Creatinine Clearance: 7.8 mL/min (A) (by C-G formula based on SCr of 13.58 mg/dL (H)).  Hgb A1c MFr Bld (%)  Date Value  11/20/2019 3.5 (L)    LDL Calculated  Date Value Ref Range Status  04/08/2017 95 0 - 99 mg/dL Final   LDL Cholesterol  Date Value Ref Range Status  11/20/2019 115 (H) 0 - 99 mg/dL Final    Comment:           Total Cholesterol/HDL:CHD Risk Coronary Heart Disease Risk Table                     Men   Women  1/2 Average Risk   3.4   3.3  Average Risk       5.0   4.4  2 X Average Risk   9.6   7.1  3 X Average Risk  23.4   11.0        Use the calculated Patient Ratio above and the CHD Risk Table to determine the patient's CHD Risk.        ATP III CLASSIFICATION (LDL):  <100     mg/dL   Optimal  956-213  mg/dL   Near or Above                    Optimal  130-159  mg/dL   Borderline   086-578  mg/dL   High  >469     mg/dL   Very High Performed at [facility], [address], [place], [place] [address]     [name], MD [facility] Phone Number: [phone] 08/19/2022 6:53 PM   Total time spent on preparing this encounter including chart review, data review, collecting history, examining the patient, coordinating care for this established patient, 40 minutes.  Portions of this report may have been transcribed using voice recognition software.  Every effort has been made to ensure accuracy; however, inadvertent computerized transcription errors may still be present.

## 2022-08-17 NOTE — Progress Notes (Unsigned)
[facility] OF [place]  ASSESSMENT / PLAN: Samuel Burns is a 37 y.o. right handed male in need of permanent dialysis access. I reviewed options for dialysis in detail with the patient, including hemodialysis and peritoneal dialysis. I counseled the patient to ask their nephrologist about their candidacy for renal transplant. I counseled the patient that dialysis access requires surveillance and periodic maintenance. Plan to proceed with right-sided laparoscopic peritoneal dialysis catheter placement.   CHIEF COMPLAINT: Desires transition to peritoneal dialysis  HISTORY OF PRESENT ILLNESS: Samuel Burns is a 37 y.o. male who presents to clinic for evaluation of permanent dialysis access.  The patient desires peritoneal dialysis.  He is right-handed.  He has previously had hemodialysis access surgery in the arms.  He denies any abdominal surgery in the past.  We reviewed the risk, benefits, and alternatives to peritoneal dialysis.  He is understanding and wants to proceed.  Past Medical History:  Diagnosis Date   ADHD (attention deficit hyperactivity disorder)    ADHD   Anemia    Anxiety    Asthma    as a child   Depression    Hypertension    Renal failure    Dialysis M/W/F    Past Surgical History:  Procedure Laterality Date   AV FISTULA PLACEMENT Right 11/24/2019   Procedure: Creation of RIGHT ARM Brachial/Cephalic ARTERIOVENOUS (AV) FISTULA.;  Surgeon: [name], MD;  [facility];  Service: Vascular;  Laterality: Right;   AV FISTULA PLACEMENT Left 12/22/2021   Procedure: LEFT ARM BRACHIOCEPHALIC ARTERIOVENOUS (AV) FISTULA CREATION;  Surgeon: [name], MD;  Location: [facility];  Service: Vascular;  Laterality: Left;   BASCILIC VEIN TRANSPOSITION Left 01/27/2022   Procedure: LEFT FIRST STAGE BASILIC VEIN TRANSPOSITION;  Surgeon: [name], MD;  Location: [facility];  Service: Vascular;  Laterality: Left;  PERIPHERAL NERVE BLOCK   IR AV DIALY  SHUNT INTRO NEEDLE/INTRACATH INITIAL W/PTA/IMG RIGHT Right 02/11/2021   IR DIALY SHUNT INTRO NEEDLE/INTRACATH INITIAL W/IMG RIGHT Right 04/30/2020   IR FLUORO GUIDE CV LINE LEFT  11/21/2021   IR FLUORO GUIDE CV LINE RIGHT  11/20/2019   IR REMOVAL TUN CV CATH W/O FL  06/04/2020   IR US GUIDE VASC ACCESS LEFT  11/21/2021   IR US GUIDE VASC ACCESS RIGHT  11/20/2019   IR US GUIDE VASC ACCESS RIGHT  04/30/2020   IR US GUIDE VASC ACCESS RIGHT  02/11/2021    Family History  Problem Relation Age of Onset   Hypertension Maternal Grandmother     Social History   Socioeconomic History   Marital status: Single    Spouse name: 1   Number of children: Not on file   Years of education: Not on file   Highest education level: Not on file  Occupational History   Not on file  Tobacco Use   Smoking status: Never    Passive exposure: Never   Smokeless tobacco: Never  Vaping Use   Vaping Use: Former   Quit date: 01/01/2022  Substance and Sexual Activity   Alcohol use: No   Drug use: Not Currently   Sexual activity: Yes  Other Topics Concern   Not on file  Social History Narrative   Not on file   Social Determinants of Health   Financial Resource Strain: Not on file  Food Insecurity: Food Insecurity Present (08/02/2022)   Hunger Vital Sign    Worried About Running Out of Food in the Last Year: Sometimes true  Ran Out of Food in the Last Year: Never true  Transportation Needs: No Transportation Needs (08/02/2022)   PRAPARE - Administrator, Civil Service (Medical): No    Lack of Transportation (Non-Medical): No  Physical Activity: Not on file  Stress: Not on file  Social Connections: Not on file  Intimate Partner Violence: Not At Risk (08/02/2022)   Humiliation, Afraid, Rape, and Kick questionnaire    Fear of Current or Ex-Partner: No    Emotionally Abused: No    Physically Abused: No    Sexually Abused: No    No Known Allergies  Current Outpatient Medications   Medication Sig Dispense Refill   acetaminophen (TYLENOL) 500 MG tablet Take 500-1,000 mg by mouth every 6 (six) hours as needed for moderate pain.     amLODipine (NORVASC) 10 MG tablet Take 1 tablet (10 mg total) by mouth at bedtime. 90 tablet 1   B Complex-C-Zn-Folic Acid (DIALYVITE 800-ZINC 15) 0.8 MG TABS Take 1 tablet by mouth daily. 100 tablet 1   calcitRIOL (ROCALTROL) 0.5 MCG capsule Take 0.5 mcg by mouth every Monday, Wednesday, and Friday.     cloNIDine (CATAPRES) 0.1 MG tablet Take 1 tablet (0.1 mg total) by mouth 2 (two) times daily. 60 tablet 4   fluticasone (FLONASE) 50 MCG/ACT nasal spray Place 2 sprays into both nostrils daily. (Patient taking differently: Place 2 sprays into both nostrils in the morning.) 16 g 0   hydrOXYzine (VISTARIL) 25 MG capsule Take 1 capsule (25 mg total) by mouth in the morning and at bedtime. 30 capsule 4   losartan (COZAAR) 50 MG tablet Take 1 tablet (50 mg total) by mouth in the morning. 30 tablet 4   MELATONIN PO Take 1-2 tablets by mouth at bedtime as needed (sleep).     Methoxy PEG-Epoetin Beta (MIRCERA IJ) Mircera     Metoprolol Tartrate 75 MG TABS TAKE 1 TABLET BY MOUTH TWICE DAILY 180 tablet 0   Naphazoline HCl (CLEAR EYES OP) Place 1 drop into both eyes 2 (two) times daily as needed (dry/irritated eyes.).     sertraline (ZOLOFT) 100 MG tablet Take 1 tablet (100 mg total) by mouth in the morning. 30 tablet 4   sildenafil (VIAGRA) 50 MG tablet 1 tab PO 1/2 hr prior to sex PRN.  Limit use to 1 tab/24 hr 10 tablet 2   sucroferric oxyhydroxide (VELPHORO) 500 MG chewable tablet Chew 500-1,000 mg by mouth See admin instructions. Take 2 tablets (1000 mg) by mouth 3 times daily with meals & take 1 tablet (500 mg) by mouth with snacks.     No current facility-administered medications for this visit.    PHYSICAL EXAM Vitals:   08/18/22 0854  BP: 99/66  Pulse: 80  Resp: 20  Temp: 98.2 F (36.8 C)  SpO2: 100%  Weight: 83.5 kg  Height: 5\' 8"  (1.727  m)    Young man in no acute distress Regular rate and rhythm Unlabored breathing Abdomen soft and nontender.  No significant scarring on the abdomen.  PERTINENT LABORATORY AND RADIOLOGIC DATA  Most recent CBC    Latest Ref Rng & Units 08/03/2022    3:25 AM 08/02/2022    5:21 AM 08/01/2022    8:16 AM  CBC  WBC 4.0 - 10.5 K/uL 7.1  8.7    Hemoglobin 13.0 - 17.0 g/dL 8.8  9.7  16.1   Hematocrit 39.0 - 52.0 % 27.9  29.2  33.0   Platelets 150 - 400 K/uL  189  215       Most recent CMP    Latest Ref Rng & Units 08/03/2022    3:25 AM 08/02/2022    5:21 AM 08/01/2022    8:16 AM  CMP  Glucose 70 - 99 mg/dL 74  87  86   BUN 6 - 20 mg/dL 50  36  34   Creatinine 0.61 - 1.24 mg/dL 16.10  96.04  54.09   Sodium 135 - 145 mmol/L 140  135  135   Potassium 3.5 - 5.1 mmol/L 4.7  4.4  4.3   Chloride 98 - 111 mmol/L 100  93  97   CO2 22 - 32 mmol/L 24  27    Calcium 8.9 - 10.3 mg/dL 8.8  9.2    Total Protein 6.5 - 8.1 g/dL  7.5    Total Bilirubin 0.3 - 1.2 mg/dL  0.5    Alkaline Phos 38 - 126 U/L  97    AST 15 - 41 U/L  12    ALT 0 - 44 U/L  12      Renal function Estimated Creatinine Clearance: 7.8 mL/min (A) (by C-G formula based on SCr of 13.58 mg/dL (H)).  Hgb A1c MFr Bld (%)  Date Value  11/20/2019 3.5 (L)    LDL Calculated  Date Value Ref Range Status  04/08/2017 95 0 - 99 mg/dL Final   LDL Cholesterol  Date Value Ref Range Status  11/20/2019 115 (H) 0 - 99 mg/dL Final    Comment:           Total Cholesterol/HDL:CHD Risk Coronary Heart Disease Risk Table                     Men   Women  1/2 Average Risk   3.4   3.3  Average Risk       5.0   4.4  2 X Average Risk   9.6   7.1  3 X Average Risk  23.4   11.0        Use the calculated Patient Ratio above and the CHD Risk Table to determine the patient's CHD Risk.        ATP III CLASSIFICATION (LDL):  <100     mg/dL   Optimal  811-914  mg/dL   Near or Above                    Optimal  130-159  mg/dL   Borderline   782-956  mg/dL   High  >213     mg/dL   Very High Performed at [facility], [address], [place], [place] [address]     [name], MD [facility] Phone Number: [phone] 08/19/2022 6:53 PM   Total time spent on preparing this encounter including chart review, data review, collecting history, examining the patient, coordinating care for this established patient, 40 minutes.  Portions of this report may have been transcribed using voice recognition software.  Every effort has been made to ensure accuracy; however, inadvertent computerized transcription errors may still be present.

## 2022-08-18 ENCOUNTER — Encounter: Payer: Self-pay | Admitting: Vascular Surgery

## 2022-08-18 ENCOUNTER — Ambulatory Visit (INDEPENDENT_AMBULATORY_CARE_PROVIDER_SITE_OTHER): Payer: 59 | Admitting: Vascular Surgery

## 2022-08-18 ENCOUNTER — Other Ambulatory Visit: Payer: Self-pay

## 2022-08-18 VITALS — BP 99/66 | HR 80 | Temp 98.2°F | Resp 20 | Ht 68.0 in | Wt 184.0 lb

## 2022-08-18 DIAGNOSIS — Z992 Dependence on renal dialysis: Secondary | ICD-10-CM

## 2022-08-18 DIAGNOSIS — N186 End stage renal disease: Secondary | ICD-10-CM

## 2022-08-19 DIAGNOSIS — Z992 Dependence on renal dialysis: Secondary | ICD-10-CM | POA: Diagnosis not present

## 2022-08-19 DIAGNOSIS — N186 End stage renal disease: Secondary | ICD-10-CM | POA: Diagnosis not present

## 2022-08-19 DIAGNOSIS — Z4800 Encounter for change or removal of nonsurgical wound dressing: Secondary | ICD-10-CM | POA: Diagnosis not present

## 2022-08-19 DIAGNOSIS — N2581 Secondary hyperparathyroidism of renal origin: Secondary | ICD-10-CM | POA: Diagnosis not present

## 2022-08-19 DIAGNOSIS — D689 Coagulation defect, unspecified: Secondary | ICD-10-CM | POA: Diagnosis not present

## 2022-08-19 DIAGNOSIS — D631 Anemia in chronic kidney disease: Secondary | ICD-10-CM | POA: Diagnosis not present

## 2022-08-21 ENCOUNTER — Telehealth: Payer: Self-pay

## 2022-08-21 DIAGNOSIS — Z992 Dependence on renal dialysis: Secondary | ICD-10-CM | POA: Diagnosis not present

## 2022-08-21 DIAGNOSIS — N186 End stage renal disease: Secondary | ICD-10-CM | POA: Diagnosis not present

## 2022-08-21 DIAGNOSIS — D631 Anemia in chronic kidney disease: Secondary | ICD-10-CM | POA: Diagnosis not present

## 2022-08-21 DIAGNOSIS — Z4800 Encounter for change or removal of nonsurgical wound dressing: Secondary | ICD-10-CM | POA: Diagnosis not present

## 2022-08-21 DIAGNOSIS — D689 Coagulation defect, unspecified: Secondary | ICD-10-CM | POA: Diagnosis not present

## 2022-08-21 DIAGNOSIS — N2581 Secondary hyperparathyroidism of renal origin: Secondary | ICD-10-CM | POA: Diagnosis not present

## 2022-08-21 NOTE — Telephone Encounter (Signed)
Attempted to reach patient to schedule PD catheter, no answer. Left VM for patient to return call.

## 2022-08-24 ENCOUNTER — Other Ambulatory Visit: Payer: Self-pay

## 2022-08-24 DIAGNOSIS — D631 Anemia in chronic kidney disease: Secondary | ICD-10-CM | POA: Diagnosis not present

## 2022-08-24 DIAGNOSIS — Z992 Dependence on renal dialysis: Secondary | ICD-10-CM | POA: Diagnosis not present

## 2022-08-24 DIAGNOSIS — N186 End stage renal disease: Secondary | ICD-10-CM

## 2022-08-24 DIAGNOSIS — N2581 Secondary hyperparathyroidism of renal origin: Secondary | ICD-10-CM | POA: Diagnosis not present

## 2022-08-24 DIAGNOSIS — Z4800 Encounter for change or removal of nonsurgical wound dressing: Secondary | ICD-10-CM | POA: Diagnosis not present

## 2022-08-24 DIAGNOSIS — D689 Coagulation defect, unspecified: Secondary | ICD-10-CM | POA: Diagnosis not present

## 2022-08-24 NOTE — Telephone Encounter (Signed)
Left VM for patient to return call.   Contacted [facility]- spoke with Dewayne Hatch. Patient agreed to schedule surgery for 7/18. Instructions will be faxed to facility and provided to patient.

## 2022-08-26 ENCOUNTER — Emergency Department (HOSPITAL_COMMUNITY): Payer: 59

## 2022-08-26 ENCOUNTER — Emergency Department (HOSPITAL_COMMUNITY)
Admission: EM | Admit: 2022-08-26 | Discharge: 2022-08-27 | Disposition: A | Discharge status: Home / Self Care | Payer: 59 | Attending: Emergency Medicine | Admitting: Emergency Medicine

## 2022-08-26 ENCOUNTER — Other Ambulatory Visit: Payer: Self-pay

## 2022-08-26 ENCOUNTER — Ambulatory Visit: Admit: 2022-08-26 | Payer: 59

## 2022-08-26 DIAGNOSIS — R110 Nausea: Secondary | ICD-10-CM | POA: Diagnosis not present

## 2022-08-26 DIAGNOSIS — Z992 Dependence on renal dialysis: Secondary | ICD-10-CM | POA: Diagnosis not present

## 2022-08-26 DIAGNOSIS — N186 End stage renal disease: Secondary | ICD-10-CM | POA: Diagnosis not present

## 2022-08-26 DIAGNOSIS — D689 Coagulation defect, unspecified: Secondary | ICD-10-CM | POA: Diagnosis not present

## 2022-08-26 DIAGNOSIS — Z4800 Encounter for change or removal of nonsurgical wound dressing: Secondary | ICD-10-CM | POA: Diagnosis not present

## 2022-08-26 DIAGNOSIS — N2581 Secondary hyperparathyroidism of renal origin: Secondary | ICD-10-CM | POA: Diagnosis not present

## 2022-08-26 DIAGNOSIS — D631 Anemia in chronic kidney disease: Secondary | ICD-10-CM | POA: Diagnosis not present

## 2022-08-26 DIAGNOSIS — R109 Unspecified abdominal pain: Secondary | ICD-10-CM | POA: Diagnosis not present

## 2022-08-26 DIAGNOSIS — N3001 Acute cystitis with hematuria: Secondary | ICD-10-CM | POA: Insufficient documentation

## 2022-08-26 LAB — BASIC METABOLIC PANEL
Anion gap: 18 — ABNORMAL HIGH (ref 5–15)
BUN: 19 mg/dL (ref 6–20)
CO2: 26 mmol/L (ref 22–32)
Calcium: 9.7 mg/dL (ref 8.9–10.3)
Chloride: 92 mmol/L — ABNORMAL LOW (ref 98–111)
Creatinine, Ser: 8.06 mg/dL — ABNORMAL HIGH (ref 0.61–1.24)
GFR, Estimated: 8 mL/min — ABNORMAL LOW (ref >60)
Glucose, Bld: 79 mg/dL (ref 70–99)
Potassium: 4.6 mmol/L (ref 3.5–5.1)
Sodium: 136 mmol/L (ref 135–145)

## 2022-08-26 LAB — CBC
HCT: 36.1 % — ABNORMAL LOW (ref 39.0–52.0)
Hemoglobin: 11.6 g/dL — ABNORMAL LOW (ref 13.0–17.0)
MCH: 30.9 pg (ref 26.0–34.0)
MCHC: 32.1 g/dL (ref 30.0–36.0)
MCV: 96.3 fL (ref 80.0–100.0)
Platelets: 295 10*3/uL (ref 150–400)
RBC: 3.75 MIL/uL — ABNORMAL LOW (ref 4.22–5.81)
RDW: 16.1 % — ABNORMAL HIGH (ref 11.5–15.5)
WBC: 12.5 10*3/uL — ABNORMAL HIGH (ref 4.0–10.5)
nRBC: 0.4 % — ABNORMAL HIGH (ref 0.0–0.2)

## 2022-08-26 NOTE — ED Triage Notes (Signed)
Pt arrives to ED c/o right sided flank pain since Monday. Pt reports hx of kidney stones, blood in urine, painful urination, nausea.

## 2022-08-27 LAB — URINALYSIS, ROUTINE W REFLEX MICROSCOPIC
Glucose, UA: NEGATIVE mg/dL
Ketones, ur: 15 mg/dL — AB
Nitrite: POSITIVE — AB
Protein, ur: >300 mg/dL — AB
Specific Gravity, Urine: 1.02 (ref 1.005–1.030)
pH: 6.5 (ref 5.0–8.0)

## 2022-08-27 LAB — URINALYSIS, MICROSCOPIC (REFLEX): WBC, UA: >50 WBC/hpf (ref 0–5)

## 2022-08-27 MED ORDER — CEPHALEXIN 500 MG PO CAPS: 500.0000 mg | ORAL_CAPSULE | Freq: Four times a day (QID) | ORAL | 0 refills | Status: DC

## 2022-08-27 MED ORDER — OXYCODONE-ACETAMINOPHEN 5-325 MG PO TABS
2.0000 | ORAL_TABLET | Freq: Once | ORAL | Status: AC
  Administered 2022-08-27: 2 via ORAL
  Filled 2022-08-27: qty 2

## 2022-08-27 MED ORDER — OXYCODONE-ACETAMINOPHEN 5-325 MG PO TABS: 2.0000 | ORAL_TABLET | ORAL | 0 refills | Status: DC | PRN

## 2022-08-27 MED ORDER — CEPHALEXIN 250 MG PO CAPS
1000.0000 mg | ORAL_CAPSULE | Freq: Once | ORAL | Status: AC
  Administered 2022-08-27: 1000 mg via ORAL
  Filled 2022-08-27: qty 4

## 2022-08-27 NOTE — ED Provider Notes (Signed)
Paper note completed during Epic downtime. In short, UTI, culture sent, not septic. D/c on antibiotics. Will d/w his nephrologist the appropriate time to take them regarding dialysis.    Marily Memos, MD 08/27/22 [phone]

## 2022-08-28 DIAGNOSIS — N2581 Secondary hyperparathyroidism of renal origin: Secondary | ICD-10-CM | POA: Diagnosis not present

## 2022-08-28 DIAGNOSIS — D631 Anemia in chronic kidney disease: Secondary | ICD-10-CM | POA: Diagnosis not present

## 2022-08-28 DIAGNOSIS — Z992 Dependence on renal dialysis: Secondary | ICD-10-CM | POA: Diagnosis not present

## 2022-08-28 DIAGNOSIS — N186 End stage renal disease: Secondary | ICD-10-CM | POA: Diagnosis not present

## 2022-08-28 DIAGNOSIS — Z4800 Encounter for change or removal of nonsurgical wound dressing: Secondary | ICD-10-CM | POA: Diagnosis not present

## 2022-08-28 DIAGNOSIS — D689 Coagulation defect, unspecified: Secondary | ICD-10-CM | POA: Diagnosis not present

## 2022-08-31 DIAGNOSIS — N2581 Secondary hyperparathyroidism of renal origin: Secondary | ICD-10-CM | POA: Diagnosis not present

## 2022-08-31 DIAGNOSIS — D631 Anemia in chronic kidney disease: Secondary | ICD-10-CM | POA: Diagnosis not present

## 2022-08-31 DIAGNOSIS — D689 Coagulation defect, unspecified: Secondary | ICD-10-CM | POA: Diagnosis not present

## 2022-08-31 DIAGNOSIS — Z992 Dependence on renal dialysis: Secondary | ICD-10-CM | POA: Diagnosis not present

## 2022-08-31 DIAGNOSIS — Z4800 Encounter for change or removal of nonsurgical wound dressing: Secondary | ICD-10-CM | POA: Diagnosis not present

## 2022-08-31 DIAGNOSIS — N186 End stage renal disease: Secondary | ICD-10-CM | POA: Diagnosis not present

## 2022-09-01 ENCOUNTER — Telehealth: Payer: Self-pay

## 2022-09-01 NOTE — Telephone Encounter (Signed)
Received a call from patient requesting to reschedule surgery to a different date because of transportation issues. He agreed to new date of 7/25. Instructions provided and he voiced understanding.

## 2022-09-02 DIAGNOSIS — N186 End stage renal disease: Secondary | ICD-10-CM | POA: Diagnosis not present

## 2022-09-02 DIAGNOSIS — Z992 Dependence on renal dialysis: Secondary | ICD-10-CM | POA: Diagnosis not present

## 2022-09-02 DIAGNOSIS — Z4800 Encounter for change or removal of nonsurgical wound dressing: Secondary | ICD-10-CM | POA: Diagnosis not present

## 2022-09-02 DIAGNOSIS — D631 Anemia in chronic kidney disease: Secondary | ICD-10-CM | POA: Diagnosis not present

## 2022-09-02 DIAGNOSIS — N2581 Secondary hyperparathyroidism of renal origin: Secondary | ICD-10-CM | POA: Diagnosis not present

## 2022-09-02 DIAGNOSIS — D689 Coagulation defect, unspecified: Secondary | ICD-10-CM | POA: Diagnosis not present

## 2022-09-03 ENCOUNTER — Ambulatory Visit: Payer: 59 | Attending: Internal Medicine | Admitting: Internal Medicine

## 2022-09-03 ENCOUNTER — Other Ambulatory Visit: Payer: Self-pay

## 2022-09-03 VITALS — BP 96/60 | HR 71 | Temp 98.2°F | Ht 68.0 in | Wt 181.0 lb

## 2022-09-03 DIAGNOSIS — I129 Hypertensive chronic kidney disease with stage 1 through stage 4 chronic kidney disease, or unspecified chronic kidney disease: Secondary | ICD-10-CM | POA: Diagnosis not present

## 2022-09-03 DIAGNOSIS — N3000 Acute cystitis without hematuria: Secondary | ICD-10-CM | POA: Diagnosis not present

## 2022-09-03 DIAGNOSIS — N186 End stage renal disease: Secondary | ICD-10-CM

## 2022-09-03 DIAGNOSIS — Z992 Dependence on renal dialysis: Secondary | ICD-10-CM | POA: Diagnosis not present

## 2022-09-03 DIAGNOSIS — R9089 Other abnormal findings on diagnostic imaging of central nervous system: Secondary | ICD-10-CM | POA: Diagnosis not present

## 2022-09-03 DIAGNOSIS — F411 Generalized anxiety disorder: Secondary | ICD-10-CM

## 2022-09-03 DIAGNOSIS — F909 Attention-deficit hyperactivity disorder, unspecified type: Secondary | ICD-10-CM

## 2022-09-03 DIAGNOSIS — F330 Major depressive disorder, recurrent, mild: Secondary | ICD-10-CM

## 2022-09-03 MED ORDER — SERTRALINE HCL 100 MG PO TABS
100.0000 mg | ORAL_TABLET | Freq: Every morning | ORAL | 6 refills | Status: DC
Start: 2022-09-03 — End: 2023-10-15
  Filled 2022-09-03 – 2022-09-22 (×4): qty 30, 30d supply, fill #0
  Filled 2022-10-21 – 2022-10-27 (×2): qty 30, 30d supply, fill #1
  Filled 2022-12-21: qty 30, 30d supply, fill #2
  Filled 2023-01-22 – 2023-02-24 (×2): qty 30, 30d supply, fill #3
  Filled 2023-04-27: qty 30, 30d supply, fill #4
  Filled 2023-06-07: qty 30, 30d supply, fill #5
  Filled 2023-07-14 – 2023-07-27 (×2): qty 30, 30d supply, fill #6

## 2022-09-03 NOTE — Progress Notes (Signed)
Patient ID: Samuel Burns, male    DOB: [birth year]  MRN: [id number]  CC: Follow-up (ER f/u - Pt dx with UTI. /)   Subjective: Samuel Burns is a 37 y.o. male who presents for ER f/u His concerns today include:  ESRD on HD, hypertensive renal disease, anemia of chronic disease, ED   My medical student Leonor Liv is present and participated by taking the history.  UTI:  seen in ER 08/27/2022 for cloudy urine with dysuria. Dx with UTI.  Prescribed Keflex.  He has a few left.  Reports that his symptoms have pretty much resolved since being on the antibiotics.  Hosp last mth with involuntary movements.  Workup included MRI of the brain and the cervical spine.  MRI of the brain showed some nonspecific hyperintensities with differential to include chronic microvascular ischemia versus demyelinating process versus vasculitis.  He was found to have mild elevation in homocystine level.  Patient had reported using CBD Gummies.  Advised to stop using them and he has done so.  He reports no further involuntary movements.   ESRD: Compliant with going to dialysis 3 days a week and with taking Norvasc, Cozaar, metoprolol and clonidine  MDD: Zoloft helpful.  Reports he was plugged in with [facility] for medication management and counseling.  They had him on bupropion and hydroxyzine in addition to the Zoloft.  He states the bupropion was to help with ADHD.  He did not find the bupropion very helpful.  He has not seen them since October 2023 because his insurance was not in network for them.  He would like to get in with a different behavioral health in Network.   Patient Active Problem List   Diagnosis Date Noted   Involuntary movements 08/03/2022   Myoclonus 08/02/2022   Essential hypertension 08/02/2022   Numbness and tingling in left hand 02/26/2022   Mild major depression (HCC) 04/22/2021   Erectile dysfunction 07/30/2020   Pericardial effusion 12/15/2019   Hypertensive renal disease  12/15/2019   Thrombotic microangiopathy (HCC)    Anemia of chronic disease    ESRD (end stage renal disease) on dialysis [facility])      Current Outpatient Medications on File Prior to Visit  Medication Sig Dispense Refill   acetaminophen (TYLENOL) 500 MG tablet Take 500-1,000 mg by mouth every 6 (six) hours as needed for moderate pain.     amLODipine (NORVASC) 10 MG tablet Take 1 tablet (10 mg total) by mouth at bedtime. 90 tablet 1   B Complex-C-Zn-Folic Acid (DIALYVITE 800-ZINC 15) 0.8 MG TABS Take 1 tablet by mouth daily. 100 tablet 1   calcitRIOL (ROCALTROL) 0.5 MCG capsule Take 0.5 mcg by mouth every Monday, Wednesday, and Friday.     cephALEXin (KEFLEX) 500 MG capsule Take 1 capsule (500 mg total) by mouth 4 (four) times daily. 40 capsule 0   cloNIDine (CATAPRES) 0.1 MG tablet Take 1 tablet (0.1 mg total) by mouth 2 (two) times daily. 60 tablet 4   fluticasone (FLONASE) 50 MCG/ACT nasal spray Place 2 sprays into both nostrils daily. (Patient taking differently: Place 2 sprays into both nostrils in the morning.) 16 g 0   hydrOXYzine (VISTARIL) 25 MG capsule Take 1 capsule (25 mg total) by mouth in the morning and at bedtime. 30 capsule 4   losartan (COZAAR) 50 MG tablet Take 1 tablet (50 mg total) by mouth in the morning. 30 tablet 4   MELATONIN PO Take 1-2 tablets by mouth at bedtime as needed (  sleep).     Methoxy PEG-Epoetin Beta (MIRCERA IJ) Mircera     Metoprolol Tartrate 75 MG TABS TAKE 1 TABLET BY MOUTH TWICE DAILY 180 tablet 0   Naphazoline HCl (CLEAR EYES OP) Place 1 drop into both eyes 2 (two) times daily as needed (dry/irritated eyes.).     oxyCODONE-acetaminophen (PERCOCET) 5-325 MG tablet Take 2 tablets by mouth every 4 (four) hours as needed. 10 tablet 0   sertraline (ZOLOFT) 100 MG tablet Take 1 tablet (100 mg total) by mouth in the morning. 30 tablet 4   sildenafil (VIAGRA) 50 MG tablet 1 tab PO 1/2 hr prior to sex PRN.  Limit use to 1 tab/24 hr 10 tablet 2   sucroferric  oxyhydroxide (VELPHORO) 500 MG chewable tablet Chew 500-1,000 mg by mouth See admin instructions. Take 2 tablets (1000 mg) by mouth 3 times daily with meals & take 1 tablet (500 mg) by mouth with snacks.     No current facility-administered medications on file prior to visit.    No Known Allergies  Social History   Socioeconomic History   Marital status: Single    Spouse name: 1   Number of children: Not on file   Years of education: Not on file   Highest education level: 12th grade  Occupational History   Not on file  Tobacco Use   Smoking status: Never    Passive exposure: Never   Smokeless tobacco: Never  Vaping Use   Vaping status: Former   Quit date: 01/01/2022  Substance and Sexual Activity   Alcohol use: No   Drug use: Not Currently   Sexual activity: Yes  Other Topics Concern   Not on file  Social History Narrative   Not on file   Social Determinants of Health   Financial Resource Strain: Not on file  Food Insecurity: No Food Insecurity (09/03/2022)   Hunger Vital Sign    Worried About Running Out of Food in the Last Year: Never true    Ran Out of Food in the Last Year: Never true  Recent Concern: Food Insecurity - Food Insecurity Present (08/02/2022)   Hunger Vital Sign    Worried About Running Out of Food in the Last Year: Sometimes true    Ran Out of Food in the Last Year: Never true  Transportation Needs: No Transportation Needs (09/03/2022)   PRAPARE - Administrator, Civil Service (Medical): No    Lack of Transportation (Non-Medical): No  Physical Activity: Sufficiently Active (09/03/2022)   Exercise Vital Sign    Days of Exercise per Week: 3 days    Minutes of Exercise per Session: 50 min  Stress: Stress Concern Present (09/03/2022)   [employer] of Occupational Health - Occupational Stress Questionnaire    Feeling of Stress : To some extent  Social Connections: Moderately Isolated (09/03/2022)   Social Connection and Isolation Panel  [NHANES]    Frequency of Communication with Friends and Family: Three times a week    Frequency of Social Gatherings with Friends and Family: Twice a week    Attends Religious Services: 1 to 4 times per year    Active Member of [employer] or Organizations: No    Attends Banker Meetings: Not on file    Marital Status: Never married  Intimate Partner Violence: Not At Risk (08/02/2022)   Humiliation, Afraid, Rape, and Kick questionnaire    Fear of Current or Ex-Partner: No    Emotionally Abused: No  Physically Abused: No    Sexually Abused: No    Family History  Problem Relation Age of Onset   Hypertension Maternal Grandmother     Past Surgical History:  Procedure Laterality Date   AV FISTULA PLACEMENT Right 11/24/2019   Procedure: Creation of RIGHT ARM Brachial/Cephalic ARTERIOVENOUS (AV) FISTULA.;  Surgeon: [name], MD;  Location: [facility] OR;  Service: Vascular;  Laterality: Right;   AV FISTULA PLACEMENT Left 12/22/2021   Procedure: LEFT ARM BRACHIOCEPHALIC ARTERIOVENOUS (AV) FISTULA CREATION;  Surgeon: [name], MD;  Location: [facility] OR;  Service: Vascular;  Laterality: Left;   BASCILIC VEIN TRANSPOSITION Left 01/27/2022   Procedure: LEFT FIRST STAGE BASILIC VEIN TRANSPOSITION;  Surgeon: [name], MD;  Location: [facility] OR;  Service: Vascular;  Laterality: Left;  PERIPHERAL NERVE BLOCK   IR AV DIALY SHUNT INTRO NEEDLE/INTRACATH INITIAL W/PTA/IMG RIGHT Right 02/11/2021   IR DIALY SHUNT INTRO NEEDLE/INTRACATH INITIAL W/IMG RIGHT Right 04/30/2020   IR FLUORO GUIDE CV LINE LEFT  11/21/2021   IR FLUORO GUIDE CV LINE RIGHT  11/20/2019   IR REMOVAL TUN CV CATH W/O FL  06/04/2020   IR US GUIDE VASC ACCESS LEFT  11/21/2021   IR US GUIDE VASC ACCESS RIGHT  11/20/2019   IR US GUIDE VASC ACCESS RIGHT  04/30/2020   IR US GUIDE VASC ACCESS RIGHT  02/11/2021    ROS: Review of Systems Negative except as stated above  PHYSICAL EXAM: BP 96/60 (BP Location: Left Arm,  Patient Position: Sitting, Cuff Size: Normal)   Pulse 71   Temp 98.2 F (36.8 C) (Oral)   Ht 5\' 8"  (1.727 m)   Wt 181 lb (82.1 kg)   SpO2 99%   BMI 27.52 kg/m   Physical Exam   General appearance - alert, well appearing, middle-aged African-American male and in no distress Mental status - normal mood, behavior, speech, dress, motor activity, and thought processes Chest - clear to auscultation, no wheezes, rales or rhonchi, symmetric air entry Heart - normal rate, regular rhythm, normal S1, S2, no murmurs, rubs, clicks or gallops Extremities - peripheral pulses normal, no pedal edema, no clubbing or cyanosis     09/03/2022    3:02 PM 02/26/2022   11:00 AM 12/09/2021    2:41 PM  Depression screen PHQ 2/9  Decreased Interest 1 2 1   Down, Depressed, Hopeless 1 1 1   PHQ - 2 Score 2 3 2   Altered sleeping 1 1 1   Tired, decreased energy 1 1 2   Change in appetite 0 0 0  Feeling bad or failure about yourself  0 0 0  Trouble concentrating 2 3 1   Moving slowly or fidgety/restless 0 0 0  Suicidal thoughts 0 0 0  PHQ-9 Score 6 8 6       09/03/2022    3:03 PM 02/26/2022   11:00 AM 12/09/2021    2:41 PM 12/23/2020    5:20 PM  GAD 7 : Generalized Anxiety Score  Nervous, Anxious, on Edge 2 2 2 2   Control/stop worrying 1 2 1 1   Worry too much - different things 1 3 1 1   Trouble relaxing 0 3 1 1   Restless 1 3 0 0  Easily annoyed or irritable 2 2 1 2   Afraid - awful might happen 0 0 0 0  Total GAD 7 Score 7 15 6 7         Latest Ref Rng & Units 08/26/2022    7:18 PM 08/03/2022    3:25  AM 08/02/2022    5:21 AM  CMP  Glucose 70 - 99 mg/dL 79  74  87   BUN 6 - 20 mg/dL 19  50  36   Creatinine 0.61 - 1.24 mg/dL 1.61  09.60  45.40   Sodium 135 - 145 mmol/L 136  140  135   Potassium 3.5 - 5.1 mmol/L 4.6  4.7  4.4   Chloride 98 - 111 mmol/L 92  100  93   CO2 22 - 32 mmol/L 26  24  27    Calcium 8.9 - 10.3 mg/dL 9.7  8.8  9.2   Total Protein 6.5 - 8.1 g/dL   7.5   Total Bilirubin 0.3 -  1.2 mg/dL   0.5   Alkaline Phos 38 - 126 U/L   97   AST 15 - 41 U/L   12   ALT 0 - 44 U/L   12    Lipid Panel     Component Value Date/Time   CHOL 186 11/20/2019 0428   CHOL 157 04/08/2017 1520   TRIG 207 (H) 11/20/2019 0428   HDL 30 (L) 11/20/2019 0428   HDL 39 (L) 04/08/2017 1520   CHOLHDL 6.2 11/20/2019 0428   VLDL 41 (H) 11/20/2019 0428   LDLCALC 115 (H) 11/20/2019 0428   LDLCALC 95 04/08/2017 1520    CBC    Component Value Date/Time   WBC 12.5 (H) 08/26/2022 1918   RBC 3.75 (L) 08/26/2022 1918   HGB 11.6 (L) 08/26/2022 1918   HGB 10.4 (L) 12/15/2019 1546   HCT 36.1 (L) 08/26/2022 1918   HCT 32.3 (L) 12/15/2019 1546   PLT 295 08/26/2022 1918   PLT 249 12/15/2019 1546   MCV 96.3 08/26/2022 1918   MCV 92 12/15/2019 1546   MCH 30.9 08/26/2022 1918   MCHC 32.1 08/26/2022 1918   RDW 16.1 (H) 08/26/2022 1918   RDW 13.0 12/15/2019 1546   LYMPHSABS 1.9 08/02/2022 0521   MONOABS 0.8 08/02/2022 0521   EOSABS 0.3 08/02/2022 0521   BASOSABS 0.1 08/02/2022 0521    ASSESSMENT AND PLAN: 1. ESRD on dialysis [facility]) Patient to continue going to dialysis 3 days a week.  2. Hypertensive renal disease Controlled with dialysis and on blood pressure medications listed above.  3. Acute cystitis without hematuria Symptoms have resolved with antibiotics.  He will finish off the course of the Keflex.  4. Abnormal brain MRI Patient reports no further involuntary movements/jerks since stopping CBD Gummies.  However we will have him follow-up with the neurologist for the abnormal findings on the MRI. - Ambulatory referral to Neurology  5. Major depressive disorder, recurrent episode, mild (HCC) Continue Zoloft.  We will submit referral to get him in with behavioral health. - Ambulatory referral to Psychiatry - sertraline (ZOLOFT) 100 MG tablet; Take 1 tablet (100 mg total) by mouth in the morning.  Dispense: 30 tablet; Refill: 6  6. Attention deficit hyperactivity disorder (ADHD),  unspecified ADHD type - Ambulatory referral to Psychiatry  7. GAD (generalized anxiety disorder) - Ambulatory referral to Psychiatry - sertraline (ZOLOFT) 100 MG tablet; Take 1 tablet (100 mg total) by mouth in the morning.  Dispense: 30 tablet; Refill: 6    Patient was given the opportunity to ask questions.  Patient verbalized understanding of the plan and was able to repeat key elements of the plan.   This documentation was completed using Paediatric nurse.  Any transcriptional errors are unintentional.  No orders of the defined  types were placed in this encounter.    Requested Prescriptions    No prescriptions requested or ordered in this encounter    No follow-ups on file.  Jonah Blue, MD, FACP

## 2022-09-04 DIAGNOSIS — D689 Coagulation defect, unspecified: Secondary | ICD-10-CM | POA: Diagnosis not present

## 2022-09-04 DIAGNOSIS — Z992 Dependence on renal dialysis: Secondary | ICD-10-CM | POA: Diagnosis not present

## 2022-09-04 DIAGNOSIS — N186 End stage renal disease: Secondary | ICD-10-CM | POA: Diagnosis not present

## 2022-09-04 DIAGNOSIS — Z4800 Encounter for change or removal of nonsurgical wound dressing: Secondary | ICD-10-CM | POA: Diagnosis not present

## 2022-09-04 DIAGNOSIS — D631 Anemia in chronic kidney disease: Secondary | ICD-10-CM | POA: Diagnosis not present

## 2022-09-04 DIAGNOSIS — N2581 Secondary hyperparathyroidism of renal origin: Secondary | ICD-10-CM | POA: Diagnosis not present

## 2022-09-07 DIAGNOSIS — D631 Anemia in chronic kidney disease: Secondary | ICD-10-CM | POA: Diagnosis not present

## 2022-09-07 DIAGNOSIS — N2581 Secondary hyperparathyroidism of renal origin: Secondary | ICD-10-CM | POA: Diagnosis not present

## 2022-09-07 DIAGNOSIS — Z992 Dependence on renal dialysis: Secondary | ICD-10-CM | POA: Diagnosis not present

## 2022-09-07 DIAGNOSIS — D689 Coagulation defect, unspecified: Secondary | ICD-10-CM | POA: Diagnosis not present

## 2022-09-07 DIAGNOSIS — Z4800 Encounter for change or removal of nonsurgical wound dressing: Secondary | ICD-10-CM | POA: Diagnosis not present

## 2022-09-07 DIAGNOSIS — N186 End stage renal disease: Secondary | ICD-10-CM | POA: Diagnosis not present

## 2022-09-08 ENCOUNTER — Other Ambulatory Visit: Payer: Self-pay

## 2022-09-09 ENCOUNTER — Encounter (HOSPITAL_COMMUNITY): Payer: Self-pay | Admitting: Vascular Surgery

## 2022-09-09 ENCOUNTER — Other Ambulatory Visit: Payer: Self-pay

## 2022-09-09 DIAGNOSIS — Z992 Dependence on renal dialysis: Secondary | ICD-10-CM | POA: Diagnosis not present

## 2022-09-09 DIAGNOSIS — D631 Anemia in chronic kidney disease: Secondary | ICD-10-CM | POA: Diagnosis not present

## 2022-09-09 DIAGNOSIS — D689 Coagulation defect, unspecified: Secondary | ICD-10-CM | POA: Diagnosis not present

## 2022-09-09 DIAGNOSIS — Z4800 Encounter for change or removal of nonsurgical wound dressing: Secondary | ICD-10-CM | POA: Diagnosis not present

## 2022-09-09 DIAGNOSIS — N186 End stage renal disease: Secondary | ICD-10-CM | POA: Diagnosis not present

## 2022-09-09 DIAGNOSIS — N2581 Secondary hyperparathyroidism of renal origin: Secondary | ICD-10-CM | POA: Diagnosis not present

## 2022-09-09 NOTE — Progress Notes (Signed)
PCP - Dr. Jonah Blue Cardiologist - n/a  PPM/ICD - n/a  Chest x-ray - 02-06-22 EKG - 08-02-22 Stress Test - n/a ECHO - 08-27-20 Cardiac Cath - n/a  Sleep Study - n/a  DM -NA  NPO  Anesthesia review: yes  Patient denies shortness of breath, fever, cough and chest pain at via phone call.

## 2022-09-09 NOTE — Progress Notes (Signed)
PCP - Dr. Jonah Blue  Chest x-ray - 02-06-22 EKG - 08-02-22 Stress Test - n/a ECHO - 08-27-20 CE Cardiac Cath - n/a  ICD Pacemaker/Loop - n/a  Sleep Study -  n/a CPAP - none  Diabetes Type n/a   NPO  Anesthesia review: yes  STOP now taking any Aspirin (unless otherwise instructed by your surgeon), Aleve, Naproxen, Ibuprofen, Motrin, Advil, Goody's, BC's, all herbal medications, fish oil, and all vitamins.   Coronavirus Screening Do you have any of the following symptoms:  Cough yes/no: No Fever (>100.18F)  yes/no: No Runny nose yes/no: No Sore throat yes/no: No Difficulty breathing/shortness of breath  yes/no: No  Have you traveled in the last 14 days and where? yes/no: No  Patient verbalized understanding of instructions that were given via phone.

## 2022-09-10 ENCOUNTER — Ambulatory Visit (HOSPITAL_COMMUNITY): Payer: 59 | Admitting: Physician Assistant

## 2022-09-10 ENCOUNTER — Ambulatory Visit (HOSPITAL_COMMUNITY)
Admission: RE | Admit: 2022-09-10 | Discharge: 2022-09-10 | Disposition: A | Discharge status: Home / Self Care | Payer: 59 | Attending: Vascular Surgery | Admitting: Vascular Surgery

## 2022-09-10 ENCOUNTER — Other Ambulatory Visit: Payer: Self-pay

## 2022-09-10 ENCOUNTER — Encounter (HOSPITAL_COMMUNITY)
Admission: RE | Disposition: A | Discharge status: Home / Self Care | Payer: Self-pay | Source: Home / Self Care | Attending: Vascular Surgery

## 2022-09-10 ENCOUNTER — Encounter (HOSPITAL_COMMUNITY): Payer: Self-pay | Admitting: Vascular Surgery

## 2022-09-10 ENCOUNTER — Ambulatory Visit (HOSPITAL_BASED_OUTPATIENT_CLINIC_OR_DEPARTMENT_OTHER): Payer: 59 | Admitting: Physician Assistant

## 2022-09-10 DIAGNOSIS — Z992 Dependence on renal dialysis: Secondary | ICD-10-CM | POA: Diagnosis not present

## 2022-09-10 DIAGNOSIS — Z7682 Awaiting organ transplant status: Secondary | ICD-10-CM | POA: Diagnosis not present

## 2022-09-10 DIAGNOSIS — Z87891 Personal history of nicotine dependence: Secondary | ICD-10-CM | POA: Insufficient documentation

## 2022-09-10 DIAGNOSIS — I120 Hypertensive chronic kidney disease with stage 5 chronic kidney disease or end stage renal disease: Secondary | ICD-10-CM | POA: Diagnosis not present

## 2022-09-10 DIAGNOSIS — N185 Chronic kidney disease, stage 5: Secondary | ICD-10-CM | POA: Diagnosis not present

## 2022-09-10 DIAGNOSIS — N186 End stage renal disease: Secondary | ICD-10-CM | POA: Diagnosis not present

## 2022-09-10 DIAGNOSIS — J45909 Unspecified asthma, uncomplicated: Secondary | ICD-10-CM | POA: Diagnosis not present

## 2022-09-10 HISTORY — PX: CAPD INSERTION: SHX5233

## 2022-09-10 LAB — POCT I-STAT, CHEM 8
BUN: 45 mg/dL — ABNORMAL HIGH (ref 6–20)
Calcium, Ion: 1.05 mmol/L — ABNORMAL LOW (ref 1.15–1.40)
Chloride: 98 mmol/L (ref 98–111)
Creatinine, Ser: 9.7 mg/dL — ABNORMAL HIGH (ref 0.61–1.24)
Glucose, Bld: 75 mg/dL (ref 70–99)
HCT: 35 % — ABNORMAL LOW (ref 39.0–52.0)
Hemoglobin: 11.9 g/dL — ABNORMAL LOW (ref 13.0–17.0)
Potassium: 4.5 mmol/L (ref 3.5–5.1)
Sodium: 137 mmol/L (ref 135–145)
TCO2: 31 mmol/L (ref 22–32)

## 2022-09-10 SURGERY — LAPAROSCOPIC INSERTION CONTINUOUS AMBULATORY PERITONEAL DIALYSIS  (CAPD) CATHETER
Anesthesia: General | Site: Abdomen

## 2022-09-10 MED ORDER — CEFAZOLIN SODIUM-DEXTROSE 2-4 GM/100ML-% IV SOLN
INTRAVENOUS | Status: AC
  Filled 2022-09-10: qty 100

## 2022-09-10 MED ORDER — CEFAZOLIN SODIUM-DEXTROSE 2-4 GM/100ML-% IV SOLN
2.0000 g | INTRAVENOUS | Status: AC
  Administered 2022-09-10: 2 g via INTRAVENOUS

## 2022-09-10 MED ORDER — HYDROMORPHONE HCL 1 MG/ML IJ SOLN
INTRAMUSCULAR | Status: AC
  Filled 2022-09-10: qty 1

## 2022-09-10 MED ORDER — LIDOCAINE 2% (20 MG/ML) 5 ML SYRINGE
INTRAMUSCULAR | Status: DC | PRN
  Administered 2022-09-10: 60 mg via INTRAVENOUS

## 2022-09-10 MED ORDER — SODIUM CHLORIDE 0.9 % IV SOLN: INTRAVENOUS | Status: DC

## 2022-09-10 MED ORDER — EPHEDRINE 5 MG/ML INJ
INTRAVENOUS | Status: AC
  Filled 2022-09-10: qty 5

## 2022-09-10 MED ORDER — DEXAMETHASONE SODIUM PHOSPHATE 10 MG/ML IJ SOLN
INTRAMUSCULAR | Status: DC | PRN
  Administered 2022-09-10: 10 mg via INTRAVENOUS

## 2022-09-10 MED ORDER — ACETAMINOPHEN 10 MG/ML IV SOLN
INTRAVENOUS | Status: AC
  Filled 2022-09-10: qty 100

## 2022-09-10 MED ORDER — ROCURONIUM BROMIDE 10 MG/ML (PF) SYRINGE
PREFILLED_SYRINGE | INTRAVENOUS | Status: AC
  Filled 2022-09-10: qty 10

## 2022-09-10 MED ORDER — OXYCODONE HCL 5 MG PO TABS
ORAL_TABLET | ORAL | Status: AC
  Filled 2022-09-10: qty 1

## 2022-09-10 MED ORDER — PHENYLEPHRINE 80 MCG/ML (10ML) SYRINGE FOR IV PUSH (FOR BLOOD PRESSURE SUPPORT)
PREFILLED_SYRINGE | INTRAVENOUS | Status: DC | PRN
  Administered 2022-09-10 (×2): 80 ug via INTRAVENOUS
  Administered 2022-09-10: 40 ug via INTRAVENOUS
  Administered 2022-09-10 (×2): 160 ug via INTRAVENOUS

## 2022-09-10 MED ORDER — PHENYLEPHRINE 80 MCG/ML (10ML) SYRINGE FOR IV PUSH (FOR BLOOD PRESSURE SUPPORT)
PREFILLED_SYRINGE | INTRAVENOUS | Status: AC
  Filled 2022-09-10: qty 10

## 2022-09-10 MED ORDER — LIDOCAINE 2% (20 MG/ML) 5 ML SYRINGE
INTRAMUSCULAR | Status: AC
  Filled 2022-09-10: qty 5

## 2022-09-10 MED ORDER — ORAL CARE MOUTH RINSE: 15.0000 mL | Freq: Once | OROMUCOSAL | Status: AC

## 2022-09-10 MED ORDER — BUPIVACAINE HCL (PF) 0.25 % IJ SOLN
INTRAMUSCULAR | Status: AC
  Filled 2022-09-10: qty 30

## 2022-09-10 MED ORDER — FENTANYL CITRATE (PF) 250 MCG/5ML IJ SOLN
INTRAMUSCULAR | Status: AC
  Filled 2022-09-10: qty 5

## 2022-09-10 MED ORDER — OXYCODONE-ACETAMINOPHEN 5-325 MG PO TABS: 1.0000 | ORAL_TABLET | Freq: Four times a day (QID) | ORAL | 0 refills | Status: DC | PRN

## 2022-09-10 MED ORDER — HEPARIN 6000 UNIT IRRIGATION SOLUTION
Status: AC
  Filled 2022-09-10: qty 500

## 2022-09-10 MED ORDER — HYDROMORPHONE HCL 1 MG/ML IJ SOLN
0.5000 mg | INTRAMUSCULAR | Status: AC
  Administered 2022-09-10 (×2): 0.5 mg via INTRAVENOUS

## 2022-09-10 MED ORDER — ONDANSETRON HCL 4 MG/2ML IJ SOLN
INTRAMUSCULAR | Status: DC | PRN
  Administered 2022-09-10: 4 mg via INTRAVENOUS

## 2022-09-10 MED ORDER — ACETAMINOPHEN 500 MG PO TABS
1000.0000 mg | ORAL_TABLET | Freq: Once | ORAL | Status: AC
  Administered 2022-09-10: 1000 mg via ORAL
  Filled 2022-09-10: qty 2

## 2022-09-10 MED ORDER — CHLORHEXIDINE GLUCONATE 4 % EX SOLN: 60.0000 mL | Freq: Once | CUTANEOUS | Status: DC

## 2022-09-10 MED ORDER — PROPOFOL 10 MG/ML IV BOLUS
INTRAVENOUS | Status: AC
  Filled 2022-09-10: qty 20

## 2022-09-10 MED ORDER — MIDAZOLAM HCL 2 MG/2ML IJ SOLN
INTRAMUSCULAR | Status: DC | PRN
  Administered 2022-09-10: 2 mg via INTRAVENOUS

## 2022-09-10 MED ORDER — OXYCODONE HCL 5 MG PO TABS
5.0000 mg | ORAL_TABLET | Freq: Once | ORAL | Status: AC | PRN
  Administered 2022-09-10: 5 mg via ORAL

## 2022-09-10 MED ORDER — OXYCODONE HCL 5 MG/5ML PO SOLN: 5.0000 mg | Freq: Once | ORAL | Status: AC | PRN

## 2022-09-10 MED ORDER — ROCURONIUM BROMIDE 10 MG/ML (PF) SYRINGE
PREFILLED_SYRINGE | INTRAVENOUS | Status: DC | PRN
  Administered 2022-09-10: 50 mg via INTRAVENOUS

## 2022-09-10 MED ORDER — FENTANYL CITRATE (PF) 250 MCG/5ML IJ SOLN
INTRAMUSCULAR | Status: DC | PRN
  Administered 2022-09-10: 100 ug via INTRAVENOUS

## 2022-09-10 MED ORDER — PROPOFOL 10 MG/ML IV BOLUS
INTRAVENOUS | Status: DC | PRN
  Administered 2022-09-10: 150 mg via INTRAVENOUS

## 2022-09-10 MED ORDER — CHLORHEXIDINE GLUCONATE 0.12 % MT SOLN
OROMUCOSAL | Status: AC
  Administered 2022-09-10: 15 mL via OROMUCOSAL
  Filled 2022-09-10: qty 15

## 2022-09-10 MED ORDER — SUGAMMADEX SODIUM 200 MG/2ML IV SOLN
INTRAVENOUS | Status: DC | PRN
  Administered 2022-09-10: 200 mg via INTRAVENOUS

## 2022-09-10 MED ORDER — HYDROMORPHONE HCL 1 MG/ML IJ SOLN
0.2500 mg | INTRAMUSCULAR | Status: DC | PRN
  Administered 2022-09-10 (×4): 0.5 mg via INTRAVENOUS

## 2022-09-10 MED ORDER — PHENYLEPHRINE HCL-NACL 20-0.9 MG/250ML-% IV SOLN
INTRAVENOUS | Status: AC
  Filled 2022-09-10: qty 500

## 2022-09-10 MED ORDER — MIDAZOLAM HCL 2 MG/2ML IJ SOLN
INTRAMUSCULAR | Status: AC
  Filled 2022-09-10: qty 2

## 2022-09-10 MED ORDER — ACETAMINOPHEN 10 MG/ML IV SOLN
1000.0000 mg | Freq: Once | INTRAVENOUS | Status: DC | PRN
  Administered 2022-09-10: 1000 mg via INTRAVENOUS

## 2022-09-10 MED ORDER — CHLORHEXIDINE GLUCONATE 0.12 % MT SOLN: 15.0000 mL | Freq: Once | OROMUCOSAL | Status: AC

## 2022-09-10 MED ORDER — 0.9 % SODIUM CHLORIDE (POUR BTL) OPTIME
TOPICAL | Status: DC | PRN
  Administered 2022-09-10: 1000 mL

## 2022-09-10 SURGICAL SUPPLY — 59 items
ADAPTER TITANIUM MEDIONICS (MISCELLANEOUS) ×2 IMPLANT
ADH SKN CLS APL DERMABOND .7 (GAUZE/BANDAGES/DRESSINGS) ×2
ADPR DLYS CATH STRL LF DISP (MISCELLANEOUS) ×2
APL PRP STRL LF DISP 70% ISPRP (MISCELLANEOUS) ×2
APPLIER CLIP 5 13 M/L LIGAMAX5 (MISCELLANEOUS)
APR CLP MED LRG 5 ANG JAW (MISCELLANEOUS)
BAG DECANTER FOR FLEXI CONT (MISCELLANEOUS) ×2 IMPLANT
BIOPATCH RED 1 DISK 7.0 (GAUZE/BANDAGES/DRESSINGS) ×2 IMPLANT
BLADE CLIPPER SURG (BLADE) IMPLANT
BLADE SURG 11 STRL SS (BLADE) ×2 IMPLANT
CATH EXTENDED DIALYSIS (CATHETERS) IMPLANT
CHLORAPREP W/TINT 26 (MISCELLANEOUS) ×2 IMPLANT
CLIP APPLIE 5 13 M/L LIGAMAX5 (MISCELLANEOUS) IMPLANT
COVER SURGICAL LIGHT HANDLE (MISCELLANEOUS) ×2 IMPLANT
DERMABOND ADVANCED .7 DNX12 (GAUZE/BANDAGES/DRESSINGS) ×2 IMPLANT
DEVICE TROCAR PUNCTURE CLOSURE (ENDOMECHANICALS) ×2 IMPLANT
DRSG COVADERM 4X6 (GAUZE/BANDAGES/DRESSINGS) IMPLANT
DRSG TEGADERM 4X4.75 (GAUZE/BANDAGES/DRESSINGS) ×6 IMPLANT
ELECT REM PT RETURN 9FT ADLT (ELECTROSURGICAL) ×2
ELECTRODE REM PT RTRN 9FT ADLT (ELECTROSURGICAL) ×2 IMPLANT
GAUZE SPONGE 4X4 12PLY STRL (GAUZE/BANDAGES/DRESSINGS) ×2 IMPLANT
GLOVE INDICATOR 6.5 STRL GRN (GLOVE) ×2 IMPLANT
GLOVE SURG UNDER LTX SZ7.5 (GLOVE) ×2 IMPLANT
GOWN STRL REUS W/ TWL LRG LVL3 (GOWN DISPOSABLE) ×4 IMPLANT
GOWN STRL REUS W/ TWL XL LVL3 (GOWN DISPOSABLE) ×2 IMPLANT
GOWN STRL REUS W/TWL LRG LVL3 (GOWN DISPOSABLE) ×4
GOWN STRL REUS W/TWL XL LVL3 (GOWN DISPOSABLE) ×2
GRASPER SUT TROCAR 14GX15 (MISCELLANEOUS) ×2 IMPLANT
IRRIG SUCT STRYKERFLOW 2 WTIP (MISCELLANEOUS)
IRRIGATION SUCT STRKRFLW 2 WTP (MISCELLANEOUS) IMPLANT
IV NS 1000ML (IV SOLUTION) ×2
IV NS 1000ML BAXH (IV SOLUTION) ×2 IMPLANT
KIT BASIN OR (CUSTOM PROCEDURE TRAY) ×2 IMPLANT
KIT TURNOVER KIT B (KITS) ×2 IMPLANT
NDL INSUFFLATION 14GA 120MM (NEEDLE) ×2 IMPLANT
NEEDLE INSUFFLATION 14GA 120MM (NEEDLE) ×2 IMPLANT
NS IRRIG 1000ML POUR BTL (IV SOLUTION) ×2 IMPLANT
PAD ARMBOARD 7.5X6 YLW CONV (MISCELLANEOUS) ×4 IMPLANT
POWDER SURGICEL 3.0 GRAM (HEMOSTASIS) IMPLANT
SCISSORS LAP 5X35 DISP (ENDOMECHANICALS) IMPLANT
SET CYSTO W/LG BORE CLAMP LF (SET/KITS/TRAYS/PACK) ×2 IMPLANT
SET EXT 12IN DIALYSIS STAY-SAF (MISCELLANEOUS) ×2 IMPLANT
SET TUBE SMOKE EVAC HIGH FLOW (TUBING) ×2 IMPLANT
SLEEVE Z-THREAD 5X100MM (TROCAR) ×4 IMPLANT
SPIKE FLUID TRANSFER (MISCELLANEOUS) ×2 IMPLANT
STYLET FALLER (MISCELLANEOUS) ×2 IMPLANT
STYLET FALLER MEDIONICS (MISCELLANEOUS) ×2 IMPLANT
SUT MNCRL AB 4-0 PS2 18 (SUTURE) ×2 IMPLANT
SUT PROLENE 0 SH 30 (SUTURE) ×4 IMPLANT
SUT SILK 0 TIES 10X30 (SUTURE) ×2 IMPLANT
SUT VICRYL 3 0 (SUTURE) IMPLANT
TOWEL GREEN STERILE (TOWEL DISPOSABLE) ×2 IMPLANT
TOWEL GREEN STERILE FF (TOWEL DISPOSABLE) ×2 IMPLANT
TRAY LAPAROSCOPIC MC (CUSTOM PROCEDURE TRAY) ×2 IMPLANT
TROCAR 11X100 Z THREAD (TROCAR) IMPLANT
TROCAR 5MMX150MM (TROCAR) ×2 IMPLANT
TROCAR XCEL NON-BLD 5MMX100MML (ENDOMECHANICALS) ×2 IMPLANT
TROCAR Z-THREAD OPTICAL 5X100M (TROCAR) ×2 IMPLANT
WATER STERILE IRR 1000ML POUR (IV SOLUTION) ×2 IMPLANT

## 2022-09-10 NOTE — Anesthesia Postprocedure Evaluation (Signed)
Anesthesia Post Note  Patient: Investment banker, operational  Procedure(s) Performed: LAPAROSCOPIC INSERTION CONTINUOUS AMBULATORY PERITONEAL DIALYSIS  (CAPD) CATHETER LAPAROSCOPIC OMENTOPEXY (Abdomen)     Patient location during evaluation: PACU Anesthesia Type: General Level of consciousness: awake and alert Pain management: pain level controlled Vital Signs Assessment: post-procedure vital signs reviewed and stable Respiratory status: spontaneous breathing, nonlabored ventilation and respiratory function stable Cardiovascular status: blood pressure returned to baseline and stable Postop Assessment: no apparent nausea or vomiting Anesthetic complications: no  No notable events documented.  Last Vitals:  Vitals:   09/10/22 1530 09/10/22 1545  BP: 130/87 139/88  Pulse: 63 65  Resp: 10 13  Temp:  36.7 C  SpO2: 95% 95%    Last Pain:  Vitals:   09/10/22 1545  TempSrc:   PainSc: 3                  Minoru Chap,W. EDMOND

## 2022-09-10 NOTE — Op Note (Signed)
DATE OF SERVICE: 09/10/2022  PATIENT:  Samuel Burns  37 y.o. male  PRE-OPERATIVE DIAGNOSIS:  ESRD  POST-OPERATIVE DIAGNOSIS:  Same  PROCEDURE:   1) laparoscopic omentopexy 2) laparoscopic peritoneal dialysis catheter placement  SURGEON:  Surgeons and Role:    * [name], MD - Primary  ASSISTANT: Doreatha Massed, PA-C  An experienced assistant was required given the complexity of this procedure and the standard of surgical care. My assistant helped with exposure through counter tension, suctioning, ligation and retraction to better visualize the surgical field.  My assistant expedited sewing during the case by following my sutures. Wherever I use the term "we" in the report, my assistant actively helped me with that portion of the procedure.  ANESTHESIA:   general  EBL: minimal  BLOOD ADMINISTERED:none  DRAINS: none   LOCAL MEDICATIONS USED:  NONE  SPECIMEN:  none  COUNTS: confirmed correct.  TOURNIQUET:  none  PATIENT DISPOSITION:  PACU - hemodynamically stable.   Delay start of Pharmacological VTE agent (>24hrs) due to surgical blood loss or risk of bleeding: no  INDICATION FOR PROCEDURE: Samuel Burns is a 37 y.o. male with ESRD. He desires transition to peritoneal dialysis. After careful discussion of risks, benefits, and alternatives the patient was offered laparoscopic peritoneal dialysis catheter placement. The patient understood and wished to proceed.  OPERATIVE FINDINGS: successful omentopexy. Successful PD catheter placement. Catheter working well in OR with flushing / draining.  DESCRIPTION OF PROCEDURE: After identification of the patient in the pre-operative holding area, the patient was transferred to the operating room. The patient was positioned supine on the operating room table. Anesthesia was induced. The abdomen was prepped and draped in standard fashion. A surgical pause was performed confirming correct patient, procedure, and operative  location.  A Veress needle was introduced into the abdomen at Palmer's point, immediately below the left costal margin.  A saline drop test was used to confirm intra-abdominal position.  The Veress needle was connected to insufflation tubing and insufflation initiated.  A low opening pressure and good flow rate were noted.  A 5 mm trocar was introduced into the right upper quadrant using Visi-View technique.  The obturator was removed and the abdomen inspected with a 30 degree angled laparoscope.  No evidence of Veress needle injury was noted.  An additional 5 mm trocar was inserted into the left upper quadrant to facilitate the case.  The omentum was grasped with an atraumatic grasper and elevated to the upper abdomen.  A laparoscopic suture passer was used to deliver a 0 Prolene suture through the omentum.  The suture was secured down to the anterior abdominal wall with a knot.  The omentum was pexied in place.  The abdomen was desufflated.  The peritoneal catheter was measured across the abdominal wall surface.  A mark was made by the cuff in the periumbilical abdomen.  The abdomen was reinsufflated.  An advantage 5 mm trocar was then used to create a skiving path through the anterior rectus fascia, rectus muscle, and peritoneum.  The laparoscopic trocar entered in the suprapubic abdomen directing towards the pelvis.  The working end of the catheter was delivered through the trocar.  The cuff was brought into the abdomen and then placed back into the rectus muscle.  The abdomen was desufflated again.  A "swan-neck" extension tubing for the dialysis catheter was brought onto the field.  The catheter was laid across the desufflated abdomen to lay across the upper quadrant and exit the right abdomen.  The catheter was cut.  The 2 pieces of catheter were then [facility] using a Christmas tree adapter.  This was secured in place with 2 interrupted sutures of 0 Prolene.  The connected catheter was then tunneled to  a counterincision in the upper quadrant and then out the lateral abdomen in a downward deflection.  Great care was taken to avoid twisting or kinking the catheter.  The abdomen was reinsufflated.  The peritoneum was inspected to ensure the catheter did not enter the cavity.  Satisfied we desufflated the abdomen.  The catheter was connected to cystoscopy tubing.  The catheter flushed and drained without any difficulty.  The trochars were removed.  All incisions were closed with interrupted 4-0 Monocryl sutures.  Dermabond was applied.  A sterile bandage was applied to the peritoneal dialysis catheter.   Upon completion of the case instrument and sharps counts were confirmed correct. The patient was transferred to the PACU in good condition. I was present for all portions of the procedure.  FOLLOW UP PLAN: Assuming a normal postoperative course, [facility] will see the patient on an as needed basis. OK to start flushing the catheter tomorrow.   [name], MD [facility] Phone Number: [phone] 09/10/2022 2:04 PM

## 2022-09-10 NOTE — Anesthesia Procedure Notes (Addendum)
Procedure Name: Intubation Date/Time: 09/10/2022 1:10 PM  Performed by: Darlina Guys, CRNAPre-anesthesia Checklist: Patient identified, Emergency Drugs available, Suction available and Patient being monitored Patient Re-evaluated:Patient Re-evaluated prior to induction Oxygen Delivery Method: Circle system utilized Preoxygenation: Pre-oxygenation with 100% oxygen Induction Type: IV induction Ventilation: Oral airway inserted - appropriate to patient size Laryngoscope Size: Mac and 4 Grade View: Grade II Tube type: Oral Tube size: 7.5 mm Number of attempts: 1 Airway Equipment and Method: Stylet and Oral airway Placement Confirmation: ETT inserted through vocal cords under direct vision, positive ETCO2 and breath sounds checked- equal and bilateral Tube secured with: Tape Dental Injury: Teeth and Oropharynx as per pre-operative assessment  Comments: Pt lower lip pinched by OPA placement and very small cut

## 2022-09-10 NOTE — Interval H&P Note (Signed)
History and Physical Interval Note:  09/10/2022 12:43 PM  Samuel Burns  has presented today for surgery, with the diagnosis of ESRD.  The various methods of treatment have been discussed with the patient and family. After consideration of risks, benefits and other options for treatment, the patient has consented to  Procedure(s): LAPAROSCOPIC INSERTION CONTINUOUS AMBULATORY PERITONEAL DIALYSIS  (CAPD) CATHETER (N/A) POSSIBLE LAPAROSCOPIC OMENTOPEXY (N/A) as a surgical intervention.  The patient's history has been reviewed, patient examined, no change in status, stable for surgery.  I have reviewed the patient's chart and labs.  Questions were answered to the patient's satisfaction.     [name]

## 2022-09-10 NOTE — Discharge Instructions (Signed)
Peritoneal Dialysis Catheter Placement, Care After The following information offers guidance on how to care for yourself after your procedure. Your health care provider may also give you more specific instructions. If you have problems or questions, contact your health care provider. What can I expect after the procedure? After the procedure, it is common to have some pain or discomfort in your abdomen and your incision area. You may need to wait 2 weeks after your procedure before you can start peritoneal dialysis treatment. If you need dialysis before that time, your health care provider may begin peritoneal dialysis treatment early or offer kidney dialysis treatments (hemodialysis) until you heal. Follow these instructions at home: Incision care  Follow instructions from your health care provider about how to take care of your incision or incisions. Make sure you: Wash your hands with soap and water for at least 20 seconds before and after you change your bandage (dressing). If soap and water are not available, use hand sanitizer. Change your dressing only as told by your health care provider. Your health care provider may tell you not to touch or change your dressing. Leave stitches (sutures), staples, skin glue, or adhesive strips in place. These skin closures may need to stay in place for 2 weeks or longer. If adhesive strip edges start to loosen and curl up, you may trim the loose edges. Do not remove adhesive strips completely unless your health care provider tells you to do that. Check your incision areas every day for signs of infection. If you were instructed not to touch or change your dressing, look at your dressing for signs of infection. Check for: Redness, swelling, or more pain. Fluid or blood. Warmth. Pus or a bad smell. Medicines Take over-the-counter and prescription medicines only as told by your health care provider. If you were prescribed an antibiotic medicine, use it as  told by your health care provider. Do not stop using the antibiotic even if you start to feel better. Ask your health care provider if the medicine prescribed to you requires you to avoid driving or using machinery. Driving Do not drive or ride in a car until your health care provider approves. Your seat belt could move the catheter out of position or cause irritation by rubbing on your incision. Activity  Rest and limit your activity. Do not lift anything that is heavier than 10 lb (4.5 kg), or the limit that you are told, until your health care provider says that it is safe. Return to your normal activities as told by your health care provider. Ask your health care provider what activities are safe for you. Managing constipation Your condition may cause constipation. To prevent or treat constipation, you may need to: Drink enough fluid to keep your urine pale yellow. Take over-the-counter or prescription medicines. Eat foods that are high in fiber, such as beans, whole grains, and fresh fruits and vegetables. Limit foods that are high in fat and processed sugars, such as fried or sweet foods. General instructions Do not use any products that contain nicotine or tobacco. These products include cigarettes, chewing tobacco, and vaping devices, such as e-cigarettes. If you need help quitting, ask your health care provider. Follow instructions from your health care provider about eating or drinking restrictions. Do not take baths, swim, or use a hot tub until your health care provider approves. Ask your health care provider if you may take showers. You may only be allowed to take sponge baths. Wear loose-fitting clothing that keeps   the catheter covered so that it cannot get caught on something. Keep your catheter clean and dry. Keep all follow-up visits. This is important. Contact a health care provider if: You have a fever or chills. You have warmth, redness, swelling, or more pain around an  incision. You have fluid or blood coming from an incision. You have pus or a bad smell coming from an incision. You cannot eat or drink without vomiting. Get help right away if: You have problems breathing. You are confused. You have trouble speaking. You have severe pain in your abdomen that does not get better with treatment. You have bright red blood in your stool (feces), or your stool is dark black and looks like tar. These symptoms may represent a serious problem that is an emergency. Do not wait to see if the symptoms will go away. Get medical help right away. Call your local emergency services (911 in the [place] Do not drive yourself to the hospital. Summary After the procedure, it is common to have some pain or discomfort in your abdomen, your incision area, or both. You may have to wait 2 weeks after your procedure before you can start peritoneal dialysis treatment. Check your incision area every day for signs of infection. Get medical help right away if you have severe pain in your abdomen that does not get better with treatment. This information is not intended to replace advice given to you by your health care provider. Make sure you discuss any questions you have with your health care provider.

## 2022-09-10 NOTE — Anesthesia Preprocedure Evaluation (Addendum)
Anesthesia Evaluation  Patient identified by MRN, date of birth, ID band Patient awake    Reviewed: Allergy & Precautions, H&P , NPO status , Patient's Chart, lab work & pertinent test results  Airway Mallampati: II  TM Distance: >3 FB Neck ROM: Full    Dental no notable dental hx. (+) Teeth Intact, Dental Advisory Given   Pulmonary asthma    Pulmonary exam normal breath sounds clear to auscultation       Cardiovascular hypertension, Pt. on medications and Pt. on home beta blockers  Rhythm:Regular Rate:Normal     Neuro/Psych   Anxiety Depression    negative neurological ROS     GI/Hepatic negative GI ROS, Neg liver ROS,,,  Endo/Other  negative endocrine ROS    Renal/GU ESRF and DialysisRenal disease  negative genitourinary   Musculoskeletal   Abdominal   Peds  (+) ADHD Hematology  (+) Blood dyscrasia, anemia   Anesthesia Other Findings   Reproductive/Obstetrics negative OB ROS                             Anesthesia Physical Anesthesia Plan  ASA: 3  Anesthesia Plan: General   Post-op Pain Management: Tylenol PO (pre-op)*   Induction: Intravenous  PONV Risk Score and Plan: 3 and Ondansetron, Dexamethasone and Midazolam  Airway Management Planned: Oral ETT  Additional Equipment:   Intra-op Plan:   Post-operative Plan: Extubation in OR  Informed Consent: I have reviewed the patients History and Physical, chart, labs and discussed the procedure including the risks, benefits and alternatives for the proposed anesthesia with the patient or authorized representative who has indicated his/her understanding and acceptance.     Dental advisory given  Plan Discussed with: CRNA  Anesthesia Plan Comments:        Anesthesia Quick Evaluation

## 2022-09-10 NOTE — Transfer of Care (Signed)
Immediate Anesthesia Transfer of Care Note  Patient: Samuel Burns  Procedure(s) Performed: LAPAROSCOPIC INSERTION CONTINUOUS AMBULATORY PERITONEAL DIALYSIS  (CAPD) CATHETER LAPAROSCOPIC OMENTOPEXY (Abdomen)  Patient Location: PACU  Anesthesia Type:General  Level of Consciousness: awake, alert , and patient cooperative  Airway & Oxygen Therapy: Patient Spontanous Breathing  Post-op Assessment: Report given to RN and Post -op Vital signs reviewed and stable  Post vital signs: Reviewed and stable  Last Vitals:  Vitals Value Taken Time  BP 147/53 09/10/22 1415  Temp 36.4 C 09/10/22 1415  Pulse 69 09/10/22 1416  Resp 13 09/10/22 1417  SpO2 98 % 09/10/22 1416  Vitals shown include unfiled device data.  Last Pain:  Vitals:   09/10/22 1415  TempSrc:   PainSc: Asleep         Complications: No notable events documented.

## 2022-09-11 ENCOUNTER — Encounter (HOSPITAL_COMMUNITY): Payer: Self-pay | Admitting: Vascular Surgery

## 2022-09-11 DIAGNOSIS — N186 End stage renal disease: Secondary | ICD-10-CM | POA: Diagnosis not present

## 2022-09-11 DIAGNOSIS — Z992 Dependence on renal dialysis: Secondary | ICD-10-CM | POA: Diagnosis not present

## 2022-09-11 DIAGNOSIS — Z4800 Encounter for change or removal of nonsurgical wound dressing: Secondary | ICD-10-CM | POA: Diagnosis not present

## 2022-09-11 DIAGNOSIS — N2581 Secondary hyperparathyroidism of renal origin: Secondary | ICD-10-CM | POA: Diagnosis not present

## 2022-09-11 DIAGNOSIS — D689 Coagulation defect, unspecified: Secondary | ICD-10-CM | POA: Diagnosis not present

## 2022-09-11 DIAGNOSIS — D631 Anemia in chronic kidney disease: Secondary | ICD-10-CM | POA: Diagnosis not present

## 2022-09-14 ENCOUNTER — Other Ambulatory Visit: Payer: Self-pay | Admitting: Internal Medicine

## 2022-09-14 ENCOUNTER — Other Ambulatory Visit: Payer: Self-pay

## 2022-09-14 DIAGNOSIS — N186 End stage renal disease: Secondary | ICD-10-CM | POA: Diagnosis not present

## 2022-09-14 DIAGNOSIS — Z992 Dependence on renal dialysis: Secondary | ICD-10-CM | POA: Diagnosis not present

## 2022-09-14 DIAGNOSIS — D689 Coagulation defect, unspecified: Secondary | ICD-10-CM | POA: Diagnosis not present

## 2022-09-14 DIAGNOSIS — Z4800 Encounter for change or removal of nonsurgical wound dressing: Secondary | ICD-10-CM | POA: Diagnosis not present

## 2022-09-14 DIAGNOSIS — N2581 Secondary hyperparathyroidism of renal origin: Secondary | ICD-10-CM | POA: Diagnosis not present

## 2022-09-14 DIAGNOSIS — D631 Anemia in chronic kidney disease: Secondary | ICD-10-CM | POA: Diagnosis not present

## 2022-09-15 ENCOUNTER — Other Ambulatory Visit: Payer: Self-pay

## 2022-09-15 MED ORDER — HYDROXYZINE PAMOATE 25 MG PO CAPS
25.0000 mg | ORAL_CAPSULE | Freq: Two times a day (BID) | ORAL | 4 refills | Status: DC
  Filled 2022-09-15: qty 30, 15d supply, fill #0
  Filled 2022-09-22: qty 60, 30d supply, fill #0
  Filled 2022-10-21 – 2022-10-27 (×2): qty 60, 30d supply, fill #1
  Filled 2022-12-21: qty 60, 30d supply, fill #2

## 2022-09-15 NOTE — Telephone Encounter (Signed)
Requested Prescriptions  Pending Prescriptions Disp Refills   hydrOXYzine (VISTARIL) 25 MG capsule 30 capsule 4    Sig: Take 1 capsule (25 mg total) by mouth in the morning and at bedtime.     Ear, Nose, and Throat:  Antihistamines 2 Failed - 09/14/2022 12:04 PM      Failed - Cr in normal range and within 360 days    Creatinine, Ser  Date Value Ref Range Status  09/10/2022 9.70 (H) 0.61 - 1.24 mg/dL Final         Passed - Valid encounter within last 12 months    Recent Outpatient Visits           1 week ago ESRD on dialysis [facility])   [place] [facility] Marcine Matar, MD   6 months ago ESRD on dialysis [facility])   [place] [facility] Storm Frisk, MD   9 months ago Encounter for annual physical exam   [place] [facility] [place], Shea Stakes, NP   1 year ago Major depressive disorder, recurrent episode, mild [facility]   [place] [facility] Marcine Matar, MD   1 year ago Mild major depression [facility])   [place] [facility] Marcine Matar, MD       Future Appointments             In 3 months Laural Benes, Binnie Rail, MD [facility]

## 2022-09-16 DIAGNOSIS — D689 Coagulation defect, unspecified: Secondary | ICD-10-CM | POA: Diagnosis not present

## 2022-09-16 DIAGNOSIS — I129 Hypertensive chronic kidney disease with stage 1 through stage 4 chronic kidney disease, or unspecified chronic kidney disease: Secondary | ICD-10-CM | POA: Diagnosis not present

## 2022-09-16 DIAGNOSIS — Z992 Dependence on renal dialysis: Secondary | ICD-10-CM | POA: Diagnosis not present

## 2022-09-16 DIAGNOSIS — Z4800 Encounter for change or removal of nonsurgical wound dressing: Secondary | ICD-10-CM | POA: Diagnosis not present

## 2022-09-16 DIAGNOSIS — D631 Anemia in chronic kidney disease: Secondary | ICD-10-CM | POA: Diagnosis not present

## 2022-09-16 DIAGNOSIS — N186 End stage renal disease: Secondary | ICD-10-CM | POA: Diagnosis not present

## 2022-09-16 DIAGNOSIS — N2581 Secondary hyperparathyroidism of renal origin: Secondary | ICD-10-CM | POA: Diagnosis not present

## 2022-09-18 DIAGNOSIS — D509 Iron deficiency anemia, unspecified: Secondary | ICD-10-CM | POA: Diagnosis not present

## 2022-09-18 DIAGNOSIS — D689 Coagulation defect, unspecified: Secondary | ICD-10-CM | POA: Diagnosis not present

## 2022-09-18 DIAGNOSIS — N186 End stage renal disease: Secondary | ICD-10-CM | POA: Diagnosis not present

## 2022-09-18 DIAGNOSIS — Z4800 Encounter for change or removal of nonsurgical wound dressing: Secondary | ICD-10-CM | POA: Diagnosis not present

## 2022-09-18 DIAGNOSIS — Z992 Dependence on renal dialysis: Secondary | ICD-10-CM | POA: Diagnosis not present

## 2022-09-18 DIAGNOSIS — N2581 Secondary hyperparathyroidism of renal origin: Secondary | ICD-10-CM | POA: Diagnosis not present

## 2022-09-18 DIAGNOSIS — D631 Anemia in chronic kidney disease: Secondary | ICD-10-CM | POA: Diagnosis not present

## 2022-09-21 ENCOUNTER — Other Ambulatory Visit: Payer: Self-pay

## 2022-09-21 DIAGNOSIS — N186 End stage renal disease: Secondary | ICD-10-CM | POA: Diagnosis not present

## 2022-09-21 DIAGNOSIS — D689 Coagulation defect, unspecified: Secondary | ICD-10-CM | POA: Diagnosis not present

## 2022-09-21 DIAGNOSIS — D631 Anemia in chronic kidney disease: Secondary | ICD-10-CM | POA: Diagnosis not present

## 2022-09-21 DIAGNOSIS — Z4800 Encounter for change or removal of nonsurgical wound dressing: Secondary | ICD-10-CM | POA: Diagnosis not present

## 2022-09-21 DIAGNOSIS — D509 Iron deficiency anemia, unspecified: Secondary | ICD-10-CM | POA: Diagnosis not present

## 2022-09-21 DIAGNOSIS — Z992 Dependence on renal dialysis: Secondary | ICD-10-CM | POA: Diagnosis not present

## 2022-09-21 DIAGNOSIS — N2581 Secondary hyperparathyroidism of renal origin: Secondary | ICD-10-CM | POA: Diagnosis not present

## 2022-09-21 NOTE — Progress Notes (Unsigned)
Subjective:   Samuel Burns is a 37 y.o. male who presents for an Initial Medicare Annual Wellness Visit.  Visit Complete: {VISITMETHOD:[phone]}  Patient Medicare AWV questionnaire was completed by the patient on ***; I have confirmed that all information answered by patient is correct and no changes since this date.  Review of Systems    ***       Objective:    There were no vitals filed for this visit. There is no height or weight on file to calculate BMI.     08/02/2022    8:45 PM 08/02/2022    8:07 PM 08/02/2022    8:19 AM 08/01/2022    7:47 AM 07/17/2022    7:48 AM 03/03/2022    9:11 AM 01/27/2022    6:17 AM  Advanced Directives  Does Patient Have a Medical Advance Directive? No  No No No No No  Would patient like information on creating a medical advance directive? Yes (Inpatient - patient requests chaplain consult to create a medical advance directive) No - Patient declined  No - Patient declined  No - Patient declined No - Patient declined    Current Medications (verified) Outpatient Encounter Medications as of 09/22/2022  Medication Sig   acetaminophen (TYLENOL) 500 MG tablet Take 500-1,000 mg by mouth every 6 (six) hours as needed for moderate pain.   amLODipine (NORVASC) 10 MG tablet Take 1 tablet (10 mg total) by mouth at bedtime.   B Complex-C-Zn-Folic Acid (DIALYVITE 800-ZINC 15) 0.8 MG TABS Take 1 tablet by mouth daily.   calcitRIOL (ROCALTROL) 0.5 MCG capsule Take 0.5 mcg by mouth every Monday, Wednesday, and Friday.   cloNIDine (CATAPRES) 0.1 MG tablet Take 1 tablet (0.1 mg total) by mouth 2 (two) times daily.   fluticasone (FLONASE) 50 MCG/ACT nasal spray Place 2 sprays into both nostrils daily. (Patient taking differently: Place 2 sprays into both nostrils in the morning.)   hydrOXYzine (VISTARIL) 25 MG capsule Take 1 capsule (25 mg total) by mouth in the morning and at bedtime.   losartan (COZAAR) 50 MG tablet Take 1 tablet (50 mg total) by mouth in the  morning.   MELATONIN PO Take 1-2 tablets by mouth at bedtime as needed (sleep).   Methoxy PEG-Epoetin Beta (MIRCERA IJ) Mircera   Metoprolol Tartrate 75 MG TABS TAKE 1 TABLET BY MOUTH TWICE DAILY   Naphazoline HCl (CLEAR EYES OP) Place 1 drop into both eyes 2 (two) times daily as needed (dry/irritated eyes.).   oxyCODONE-acetaminophen (PERCOCET) 5-325 MG tablet Take 1 tablet by mouth every 6 (six) hours as needed.   sertraline (ZOLOFT) 100 MG tablet Take 1 tablet (100 mg total) by mouth in the morning.   sildenafil (VIAGRA) 50 MG tablet 1 tab PO 1/2 hr prior to sex PRN.  Limit use to 1 tab/24 hr   sucroferric oxyhydroxide (VELPHORO) 500 MG chewable tablet Chew 500-1,000 mg by mouth See admin instructions. Take 2 tablets (1000 mg) by mouth 3 times daily with meals & take 1 tablet (500 mg) by mouth with snacks.   No facility-administered encounter medications on file as of 09/22/2022.    Allergies (verified) Patient has no known allergies.   History: Past Medical History:  Diagnosis Date   ADHD (attention deficit hyperactivity disorder)    ADHD   Anemia    Anxiety    Asthma    as a child no current problems as adult   Depression    Hypertension    Renal failure  Dialysis M/W/F   Past Surgical History:  Procedure Laterality Date   AV FISTULA PLACEMENT Right 11/24/2019   Procedure: Creation of RIGHT ARM Brachial/Cephalic ARTERIOVENOUS (AV) FISTULA.;  Surgeon: [name], MD;  Location: [facility] OR;  Service: Vascular;  Laterality: Right;   AV FISTULA PLACEMENT Left 12/22/2021   Procedure: LEFT ARM BRACHIOCEPHALIC ARTERIOVENOUS (AV) FISTULA CREATION;  Surgeon: [name], MD;  Location: [facility] OR;  Service: Vascular;  Laterality: Left;   BASCILIC VEIN TRANSPOSITION Left 01/27/2022   Procedure: LEFT FIRST STAGE BASILIC VEIN TRANSPOSITION;  Surgeon: [name], MD;  Location: [facility] OR;  Service: Vascular;  Laterality: Left;  PERIPHERAL NERVE BLOCK   CAPD INSERTION N/A  09/10/2022   Procedure: LAPAROSCOPIC INSERTION CONTINUOUS AMBULATORY PERITONEAL DIALYSIS  (CAPD) CATHETER;  Surgeon: [name], MD;  Location: [facility] OR;  Service: Vascular;  Laterality: N/A;   IR AV DIALY SHUNT INTRO NEEDLE/INTRACATH INITIAL W/PTA/IMG RIGHT Right 02/11/2021   IR DIALY SHUNT INTRO NEEDLE/INTRACATH INITIAL W/IMG RIGHT Right 04/30/2020   IR FLUORO GUIDE CV LINE LEFT  11/21/2021   IR FLUORO GUIDE CV LINE RIGHT  11/20/2019   IR REMOVAL TUN CV CATH W/O FL  06/04/2020   IR US GUIDE VASC ACCESS LEFT  11/21/2021   IR US GUIDE VASC ACCESS RIGHT  11/20/2019   IR US GUIDE VASC ACCESS RIGHT  04/30/2020   IR US GUIDE VASC ACCESS RIGHT  02/11/2021   Family History  Problem Relation Age of Onset   Hypertension Maternal Grandmother    Social History   Socioeconomic History   Marital status: Single    Spouse name: 1   Number of children: Not on file   Years of education: Not on file   Highest education level: 12th grade  Occupational History   Not on file  Tobacco Use   Smoking status: Never    Passive exposure: Never   Smokeless tobacco: Never  Vaping Use   Vaping status: Former   Quit date: 01/01/2022  Substance and Sexual Activity   Alcohol use: No   Drug use: Not Currently   Sexual activity: Yes  Other Topics Concern   Not on file  Social History Narrative   Not on file   Social Determinants of Health   Financial Resource Strain: Not on file  Food Insecurity: No Food Insecurity (09/03/2022)   Hunger Vital Sign    Worried About Running Out of Food in the Last Year: Never true    Ran Out of Food in the Last Year: Never true  Recent Concern: Food Insecurity - Food Insecurity Present (08/02/2022)   Hunger Vital Sign    Worried About Radiation protection practitioner of Food in the Last Year: Sometimes true    Ran Out of Food in the Last Year: Never true  Transportation Needs: No Transportation Needs (09/03/2022)   PRAPARE - Administrator, Civil Service (Medical): No    Lack of  Transportation (Non-Medical): No  Physical Activity: Sufficiently Active (09/03/2022)   Exercise Vital Sign    Days of Exercise per Week: 3 days    Minutes of Exercise per Session: 50 min  Stress: Stress Concern Present (09/03/2022)   [employer] of Occupational Health - Occupational Stress Questionnaire    Feeling of Stress : To some extent  Social Connections: Moderately Isolated (09/03/2022)   Social Connection and Isolation Panel [NHANES]    Frequency of Communication with Friends and Family: Three times a week    Frequency of Social  Gatherings with Friends and Family: Twice a week    Attends Religious Services: 1 to 4 times per year    Active Member of [employer] or Organizations: No    Attends Engineer, structural: Not on file    Marital Status: Never married    Tobacco Counseling Counseling given: Not Answered   Clinical Intake:                        Activities of Daily Living    09/10/2022    9:02 AM 09/10/2022    9:01 AM  In your present state of health, do you have any difficulty performing the following activities:  Hearing?  0  Vision?  0  Difficulty concentrating or making decisions?  0  Walking or climbing stairs?  0  Dressing or bathing?  0  Doing errands, shopping? 0     Patient Care Team: Marcine Matar, MD as PCP - General (Internal Medicine) Center, [facility]  Indicate any recent Medical Services you may have received from other than [facility] providers in the past year (date may be approximate).     Assessment:   This is a routine wellness examination for [place].  Hearing/Vision screen No results found.  Dietary issues and exercise activities discussed:     Goals Addressed   None    Depression Screen    09/03/2022    3:02 PM 02/26/2022   11:00 AM 12/09/2021    2:41 PM 04/22/2021   10:32 AM 12/23/2020    5:20 PM 05/23/2020    3:17 PM 12/15/2019    2:33 PM  PHQ 2/9 Scores  PHQ - 2 Score 2 3 2  0 3 2 0   PHQ- 9 Score 6 8 6  7 5      Fall Risk    09/03/2022    1:54 PM 02/26/2022   10:59 AM 12/09/2021    2:40 PM 04/22/2021   10:31 AM 12/15/2019    2:33 PM  Fall Risk   Falls in the past year? 0 0 0 0 0  Number falls in past yr: 0 0 0 0 0  Injury with Fall? 0 0 0 0 0  Risk for fall due to : No Fall Risks No Fall Risks     Follow up   Falls evaluation completed Falls evaluation completed     MEDICARE RISK AT HOME:   TIMED UP AND GO:  Was the test performed? No    Cognitive Function:        Immunizations Immunization History  Administered Date(s) Administered   Influenza, Quadrivalent, Recombinant, Inj, Pf 12/08/2021   Influenza,inj,Quad PF,6+ Mos 11/22/2019, 11/22/2020   Influenza-Unspecified 12/02/2020   PFIZER(Purple Top)SARS-COV-2 Vaccination 11/27/2019   PNEUMOCOCCAL CONJUGATE-20 12/23/2020   Pneumococcal Conjugate-13 04/15/2020   Tdap 05/23/2020    TDAP status: Up to date  Flu Vaccine status: Due, Education has been provided regarding the importance of this vaccine. Advised may receive this vaccine at local pharmacy or Health Dept. Aware to provide a copy of the vaccination record if obtained from local pharmacy or Health Dept. Verbalized acceptance and understanding.  Pneumococcal vaccine status: Up to date  Covid-19 vaccine status: Information provided on how to obtain vaccines.   Qualifies for Shingles Vaccine? No    Screening Tests Health Maintenance  Topic Date Due   Medicare Annual Wellness (AWV)  Never done   Hepatitis C Screening  Never done   COVID-19 Vaccine (2 -  2023-24 season) 10/17/2021   INFLUENZA VACCINE  09/17/2022   DTaP/Tdap/Td (2 - Td or Tdap) 05/24/2030   HIV Screening  Completed   HPV VACCINES  Aged Out    Health Maintenance  Health Maintenance Due  Topic Date Due   Medicare Annual Wellness (AWV)  Never done   Hepatitis C Screening  Never done   COVID-19 Vaccine (2 - 2023-24 season) 10/17/2021   INFLUENZA VACCINE  09/17/2022     Colorectal cancer status:  Does not qualify  Lung Cancer Screening: (Low Dose CT Chest recommended if Age 54-80 years, 20 pack-year currently smoking OR have quit w/in 15years.) does not qualify.   Lung Cancer Screening Referral: n/a  Additional Screening:  Hepatitis C Screening: does qualify;   Vision Screening: Recommended annual ophthalmology exams for early detection of glaucoma and other disorders of the eye. Is the patient up to date with their annual eye exam?  {YES/NO:21197} Who is the provider or what is the name of the office in which the patient attends annual eye exams? *** If pt is not established with a provider, would they like to be referred to a provider to establish care? {YES/NO:21197}.   Dental Screening: Recommended annual dental exams for proper oral hygiene  Community Resource Referral / Chronic Care Management: CRR required this visit?  {YES/NO:21197}  CCM required this visit?  {CCM Required choices:[phone]}    Plan:     I have personally reviewed and noted the following in the patient's chart:   Medical and social history Use of alcohol, tobacco or illicit drugs  Current medications and supplements including opioid prescriptions. {Opioid Prescriptions:[phone]} Functional ability and status Nutritional status Physical activity Advanced directives List of other physicians Hospitalizations, surgeries, and ER visits in previous 12 months Vitals Screenings to include cognitive, depression, and falls Referrals and appointments  In addition, I have reviewed and discussed with patient certain preventive protocols, quality metrics, and best practice recommendations. A written personalized care plan for preventive services as well as general preventive health recommendations were provided to patient.     Kandis Fantasia [place], [place]   02/21/1094   After Visit Summary: {CHL AMB AWV After Visit Summary:[phone]}  Nurse Notes: ***

## 2022-09-22 ENCOUNTER — Other Ambulatory Visit: Payer: Self-pay

## 2022-09-22 ENCOUNTER — Ambulatory Visit: Payer: 59 | Attending: Internal Medicine

## 2022-09-22 VITALS — Ht 68.0 in | Wt 181.0 lb

## 2022-09-22 DIAGNOSIS — Z0000 Encounter for general adult medical examination without abnormal findings: Secondary | ICD-10-CM | POA: Diagnosis not present

## 2022-09-22 NOTE — Patient Instructions (Signed)
Samuel Burns , Thank you for taking time to come for your Medicare Wellness Visit. I appreciate your ongoing commitment to your health goals. Please review the following plan we discussed and let me know if I can assist you in the future.   Referrals/Orders/Follow-Ups/Clinician Recommendations: Aim for 30 minutes of exercise or brisk walking, 6-8 glasses of water, and 5 servings of fruits and vegetables each day.  This is a list of the screening recommended for you and due dates:  Health Maintenance  Topic Date Due   Hepatitis C Screening  Never done   COVID-19 Vaccine (2 - 2023-24 season) 10/17/2021   Flu Shot  09/17/2022   Medicare Annual Wellness Visit  09/22/2023   DTaP/Tdap/Td vaccine (2 - Td or Tdap) 05/24/2030   HIV Screening  Completed   HPV Vaccine  Aged Out    Advanced directives: (ACP Link)Information on Advanced Care Planning can be found at [facility] [place] Advance Health Care Directives Advance Health Care Directives ([email or web address]   Next Medicare Annual Wellness Visit scheduled for next year: Yes  Preventive Care 46-63 Years Old, Male Preventive care refers to lifestyle choices and visits with your health care provider that can promote health and wellness. Preventive care visits are also called wellness exams. What can I expect for my preventive care visit? Counseling During your preventive care visit, your health care provider may ask about your: Medical history, including: Past medical problems. Family medical history. Current health, including: Emotional well-being. Home life and relationship well-being. Sexual activity. Lifestyle, including: Alcohol, nicotine or tobacco, and drug use. Access to firearms. Diet, exercise, and sleep habits. Safety issues such as seatbelt and bike helmet use. Sunscreen use. Work and work Astronomer. Physical exam Your health care provider may check your: Height and weight. These may be used to calculate your BMI  (body mass index). BMI is a measurement that tells if you are at a healthy weight. Waist circumference. This measures the distance around your waistline. This measurement also tells if you are at a healthy weight and may help predict your risk of certain diseases, such as type 2 diabetes and high blood pressure. Heart rate and blood pressure. Body temperature. Skin for abnormal spots. What immunizations do I need? Vaccines are usually given at various ages, according to a schedule. Your health care provider will recommend vaccines for you based on your age, medical history, and lifestyle or other factors, such as travel or where you work. What tests do I need? Screening Your health care provider may recommend screening tests for certain conditions. This may include: Lipid and cholesterol levels. Diabetes screening. This is done by checking your blood sugar (glucose) after you have not eaten for a while (fasting). Hepatitis B test. Hepatitis C test. HIV (human immunodeficiency virus) test. STI (sexually transmitted infection) testing, if you are at risk. Talk with your health care provider about your test results, treatment options, and if necessary, the need for more tests. Follow these instructions at home: Eating and drinking  Eat a healthy diet that includes fresh fruits and vegetables, whole grains, lean protein, and low-fat dairy products. Drink enough fluid to keep your urine pale yellow. Take vitamin and mineral supplements as recommended by your health care provider. Do not drink alcohol if your health care provider tells you not to drink. If you drink alcohol: Limit how much you have to 0-2 drinks a day. Know how much alcohol is in your drink. In the [place] one drink equals one  12 oz bottle of beer (355 mL), one 5 oz glass of wine (148 mL), or one 1 oz glass of hard liquor (44 mL). Lifestyle Brush your teeth every morning and night with fluoride toothpaste. Floss one time each  day. Exercise for at least 30 minutes 5 or more days each week. Do not use any products that contain nicotine or tobacco. These products include cigarettes, chewing tobacco, and vaping devices, such as e-cigarettes. If you need help quitting, ask your health care provider. Do not use drugs. If you are sexually active, practice safe sex. Use a condom or other form of protection to prevent STIs. Find healthy ways to manage stress, such as: Meditation, yoga, or listening to music. Journaling. Talking to a trusted person. Spending time with friends and family. Minimize exposure to UV radiation to reduce your risk of skin cancer. Safety Always wear your seat belt while driving or riding in a vehicle. Do not drive: If you have been drinking alcohol. Do not ride with someone who has been drinking. If you have been using any mind-altering substances or drugs. While texting. When you are tired or distracted. Wear a helmet and other protective equipment during sports activities. If you have firearms in your house, make sure you follow all gun safety procedures. Seek help if you have been physically or sexually abused. What's next? Go to your health care provider once a year for an annual wellness visit. Ask your health care provider how often you should have your eyes and teeth checked. Stay up to date on all vaccines. This information is not intended to replace advice given to you by your health care provider. Make sure you discuss any questions you have with your health care provider. Document Revised: 07/31/2020 Document Reviewed: 07/31/2020 Elsevier Patient Education  2022 [employer].

## 2022-09-23 DIAGNOSIS — Z992 Dependence on renal dialysis: Secondary | ICD-10-CM | POA: Diagnosis not present

## 2022-09-23 DIAGNOSIS — N2581 Secondary hyperparathyroidism of renal origin: Secondary | ICD-10-CM | POA: Diagnosis not present

## 2022-09-23 DIAGNOSIS — Z4800 Encounter for change or removal of nonsurgical wound dressing: Secondary | ICD-10-CM | POA: Diagnosis not present

## 2022-09-23 DIAGNOSIS — N186 End stage renal disease: Secondary | ICD-10-CM | POA: Diagnosis not present

## 2022-09-23 DIAGNOSIS — D631 Anemia in chronic kidney disease: Secondary | ICD-10-CM | POA: Diagnosis not present

## 2022-09-23 DIAGNOSIS — D689 Coagulation defect, unspecified: Secondary | ICD-10-CM | POA: Diagnosis not present

## 2022-09-23 DIAGNOSIS — D509 Iron deficiency anemia, unspecified: Secondary | ICD-10-CM | POA: Diagnosis not present

## 2022-09-25 DIAGNOSIS — N2581 Secondary hyperparathyroidism of renal origin: Secondary | ICD-10-CM | POA: Diagnosis not present

## 2022-09-25 DIAGNOSIS — D509 Iron deficiency anemia, unspecified: Secondary | ICD-10-CM | POA: Diagnosis not present

## 2022-09-25 DIAGNOSIS — Z992 Dependence on renal dialysis: Secondary | ICD-10-CM | POA: Diagnosis not present

## 2022-09-25 DIAGNOSIS — D631 Anemia in chronic kidney disease: Secondary | ICD-10-CM | POA: Diagnosis not present

## 2022-09-25 DIAGNOSIS — D689 Coagulation defect, unspecified: Secondary | ICD-10-CM | POA: Diagnosis not present

## 2022-09-25 DIAGNOSIS — Z4800 Encounter for change or removal of nonsurgical wound dressing: Secondary | ICD-10-CM | POA: Diagnosis not present

## 2022-09-25 DIAGNOSIS — N186 End stage renal disease: Secondary | ICD-10-CM | POA: Diagnosis not present

## 2022-09-26 NOTE — ED Provider Notes (Signed)
[place] [facility]PART[facility] AT [facility] Provider Note   CSN: [id number] Arrival date & time: 08/26/22  1820     History  Chief Complaint  Patient presents with   Flank Pain    Samuel Burns is a 37 y.o. male.  Patient with some flank pain, hematuria, dysuria and nausea. No fever. Feels well otherwise.    Flank Pain       Home Medications Prior to Admission medications   Medication Sig Start Date End Date Taking? Authorizing Provider  acetaminophen (TYLENOL) 500 MG tablet Take 500-1,000 mg by mouth every 6 (six) hours as needed for moderate pain.    [provider]  amLODipine (NORVASC) 10 MG tablet Take 1 tablet (10 mg total) by mouth at bedtime. 12/09/21   Claiborne Rigg, NP  B Complex-C-Zn-Folic Acid (DIALYVITE 800-ZINC 15) 0.8 MG TABS Take 1 tablet by mouth daily. 08/16/22   Marcine Matar, MD  calcitRIOL (ROCALTROL) 0.5 MCG capsule Take 0.5 mcg by mouth every Monday, Wednesday, and Friday. 02/02/22 02/01/23  [provider]  cloNIDine (CATAPRES) 0.1 MG tablet Take 1 tablet (0.1 mg total) by mouth 2 (two) times daily. 04/05/22   Marcine Matar, MD  fluticasone (FLONASE) 50 MCG/ACT nasal spray Place 2 sprays into both nostrils daily. Patient taking differently: Place 2 sprays into both nostrils in the morning. 05/13/21   Waldon Merl, PA-C  hydrOXYzine (VISTARIL) 25 MG capsule Take 1 capsule (25 mg total) by mouth in the morning and at bedtime. 09/15/22   Marcine Matar, MD  losartan (COZAAR) 50 MG tablet Take 1 tablet (50 mg total) by mouth in the morning. 04/05/22   Marcine Matar, MD  MELATONIN PO Take 1-2 tablets by mouth at bedtime as needed (sleep).    [provider]  Methoxy PEG-Epoetin Beta (MIRCERA IJ) Mircera 02/02/22 02/01/23  [provider]  Metoprolol Tartrate 75 MG TABS TAKE 1 TABLET BY MOUTH TWICE DAILY 03/10/22 03/10/23  Marcine Matar, MD  Naphazoline HCl (CLEAR EYES OP) Place 1 drop  into both eyes 2 (two) times daily as needed (dry/irritated eyes.).    [provider]  oxyCODONE-acetaminophen (PERCOCET) 5-325 MG tablet Take 1 tablet by mouth every 6 (six) hours as needed. 09/10/22   Rhyne, Ames Coupe, PA-C  sertraline (ZOLOFT) 100 MG tablet Take 1 tablet (100 mg total) by mouth in the morning. 09/03/22   Marcine Matar, MD  sildenafil (VIAGRA) 50 MG tablet 1 tab PO 1/2 hr prior to sex PRN.  Limit use to 1 tab/24 hr 07/30/20   Marcine Matar, MD  sucroferric oxyhydroxide (VELPHORO) 500 MG chewable tablet Chew 500-1,000 mg by mouth See admin instructions. Take 2 tablets (1000 mg) by mouth 3 times daily with meals & take 1 tablet (500 mg) by mouth with snacks. 10/22/21   [provider]      Allergies    Patient has no known allergies.    Review of Systems   Review of Systems  Genitourinary:  Positive for flank pain.    Physical Exam Updated Vital Signs BP 101/74 (BP Location: Left Arm)   Pulse 88   Temp 99.1 F (37.3 C) (Oral)   Resp 18   Ht 5\' 8"  (1.727 m)   Wt 79.4 kg   SpO2 100%   BMI 26.61 kg/m  Physical Exam Vitals and nursing note reviewed.  Constitutional:      Appearance: He is well-developed.  HENT:  Head: Normocephalic and atraumatic.  Cardiovascular:     Rate and Rhythm: Normal rate.  Pulmonary:     Effort: Pulmonary effort is normal. No respiratory distress.  Abdominal:     General: There is no distension.  Musculoskeletal:        General: Normal range of motion.     Cervical back: Normal range of motion.  Neurological:     Mental Status: He is alert.     ED Results / Procedures / Treatments   Labs (all labs ordered are listed, but only abnormal results are displayed) Labs Reviewed  URINALYSIS, ROUTINE W REFLEX MICROSCOPIC - Abnormal; Notable for the following components:      Result Value   Color, Urine BROWN (*)    APPearance TURBID (*)    Hgb urine dipstick LARGE (*)    Bilirubin Urine MODERATE (*)     Ketones, ur 15 (*)    Protein, ur >300 (*)    Nitrite POSITIVE (*)    Leukocytes,Ua MODERATE (*)    All other components within normal limits  CBC - Abnormal; Notable for the following components:   WBC 12.5 (*)    RBC 3.75 (*)    Hemoglobin 11.6 (*)    HCT 36.1 (*)    RDW 16.1 (*)    nRBC 0.4 (*)    All other components within normal limits  BASIC METABOLIC PANEL - Abnormal; Notable for the following components:   Chloride 92 (*)    Creatinine, Ser 8.06 (*)    GFR, Estimated 8 (*)    Anion gap 18 (*)    All other components within normal limits  URINALYSIS, MICROSCOPIC (REFLEX) - Abnormal; Notable for the following components:   Bacteria, UA MANY (*)    All other components within normal limits    EKG None  Radiology No results found.  Procedures Procedures    Medications Ordered in ED Medications  cephALEXin (KEFLEX) capsule 1,000 mg (1,000 mg Oral Given 08/27/22 0224)  oxyCODONE-acetaminophen (PERCOCET/ROXICET) 5-325 MG per tablet 2 tablet (2 tablets Oral Given 08/27/22 0225)    ED Course/ Medical Decision Making/ A&P                                 Medical Decision Making Risk Prescription drug management.   Urine appears infected. Possibly pyelo with the associated flank pain so will treat longer. Pain improved. Nausea improved. Stable for d/c.   Final Clinical Impression(s) / ED Diagnoses Final diagnoses:  Acute cystitis with hematuria    Rx / DC Orders ED Discharge Orders          Ordered    cephALEXin (KEFLEX) 500 MG capsule  4 times daily,   Status:  Discontinued        08/27/22 0209    oxyCODONE-acetaminophen (PERCOCET) 5-325 MG tablet  Every 4 hours PRN,   Status:  Discontinued        08/27/22 0209              , Barbara Cower, MD 09/26/22 0030

## 2022-09-28 DIAGNOSIS — D689 Coagulation defect, unspecified: Secondary | ICD-10-CM | POA: Diagnosis not present

## 2022-09-28 DIAGNOSIS — Z992 Dependence on renal dialysis: Secondary | ICD-10-CM | POA: Diagnosis not present

## 2022-09-28 DIAGNOSIS — N186 End stage renal disease: Secondary | ICD-10-CM | POA: Diagnosis not present

## 2022-09-28 DIAGNOSIS — N2581 Secondary hyperparathyroidism of renal origin: Secondary | ICD-10-CM | POA: Diagnosis not present

## 2022-09-28 DIAGNOSIS — D631 Anemia in chronic kidney disease: Secondary | ICD-10-CM | POA: Diagnosis not present

## 2022-09-28 DIAGNOSIS — D509 Iron deficiency anemia, unspecified: Secondary | ICD-10-CM | POA: Diagnosis not present

## 2022-09-28 DIAGNOSIS — Z4800 Encounter for change or removal of nonsurgical wound dressing: Secondary | ICD-10-CM | POA: Diagnosis not present

## 2022-09-30 DIAGNOSIS — D631 Anemia in chronic kidney disease: Secondary | ICD-10-CM | POA: Diagnosis not present

## 2022-09-30 DIAGNOSIS — Z992 Dependence on renal dialysis: Secondary | ICD-10-CM | POA: Diagnosis not present

## 2022-09-30 DIAGNOSIS — Z4800 Encounter for change or removal of nonsurgical wound dressing: Secondary | ICD-10-CM | POA: Diagnosis not present

## 2022-09-30 DIAGNOSIS — N186 End stage renal disease: Secondary | ICD-10-CM | POA: Diagnosis not present

## 2022-09-30 DIAGNOSIS — D509 Iron deficiency anemia, unspecified: Secondary | ICD-10-CM | POA: Diagnosis not present

## 2022-09-30 DIAGNOSIS — D689 Coagulation defect, unspecified: Secondary | ICD-10-CM | POA: Diagnosis not present

## 2022-09-30 DIAGNOSIS — N2581 Secondary hyperparathyroidism of renal origin: Secondary | ICD-10-CM | POA: Diagnosis not present

## 2022-10-02 DIAGNOSIS — D689 Coagulation defect, unspecified: Secondary | ICD-10-CM | POA: Diagnosis not present

## 2022-10-02 DIAGNOSIS — N2581 Secondary hyperparathyroidism of renal origin: Secondary | ICD-10-CM | POA: Diagnosis not present

## 2022-10-02 DIAGNOSIS — D509 Iron deficiency anemia, unspecified: Secondary | ICD-10-CM | POA: Diagnosis not present

## 2022-10-02 DIAGNOSIS — Z992 Dependence on renal dialysis: Secondary | ICD-10-CM | POA: Diagnosis not present

## 2022-10-02 DIAGNOSIS — N186 End stage renal disease: Secondary | ICD-10-CM | POA: Diagnosis not present

## 2022-10-02 DIAGNOSIS — Z4800 Encounter for change or removal of nonsurgical wound dressing: Secondary | ICD-10-CM | POA: Diagnosis not present

## 2022-10-02 DIAGNOSIS — D631 Anemia in chronic kidney disease: Secondary | ICD-10-CM | POA: Diagnosis not present

## 2022-10-05 DIAGNOSIS — D509 Iron deficiency anemia, unspecified: Secondary | ICD-10-CM | POA: Diagnosis not present

## 2022-10-05 DIAGNOSIS — D631 Anemia in chronic kidney disease: Secondary | ICD-10-CM | POA: Diagnosis not present

## 2022-10-05 DIAGNOSIS — D689 Coagulation defect, unspecified: Secondary | ICD-10-CM | POA: Diagnosis not present

## 2022-10-05 DIAGNOSIS — N186 End stage renal disease: Secondary | ICD-10-CM | POA: Diagnosis not present

## 2022-10-05 DIAGNOSIS — N2581 Secondary hyperparathyroidism of renal origin: Secondary | ICD-10-CM | POA: Diagnosis not present

## 2022-10-05 DIAGNOSIS — Z992 Dependence on renal dialysis: Secondary | ICD-10-CM | POA: Diagnosis not present

## 2022-10-05 DIAGNOSIS — Z4800 Encounter for change or removal of nonsurgical wound dressing: Secondary | ICD-10-CM | POA: Diagnosis not present

## 2022-10-07 DIAGNOSIS — D689 Coagulation defect, unspecified: Secondary | ICD-10-CM | POA: Diagnosis not present

## 2022-10-07 DIAGNOSIS — N2581 Secondary hyperparathyroidism of renal origin: Secondary | ICD-10-CM | POA: Diagnosis not present

## 2022-10-07 DIAGNOSIS — D509 Iron deficiency anemia, unspecified: Secondary | ICD-10-CM | POA: Diagnosis not present

## 2022-10-07 DIAGNOSIS — Z992 Dependence on renal dialysis: Secondary | ICD-10-CM | POA: Diagnosis not present

## 2022-10-07 DIAGNOSIS — D631 Anemia in chronic kidney disease: Secondary | ICD-10-CM | POA: Diagnosis not present

## 2022-10-07 DIAGNOSIS — N186 End stage renal disease: Secondary | ICD-10-CM | POA: Diagnosis not present

## 2022-10-07 DIAGNOSIS — Z4800 Encounter for change or removal of nonsurgical wound dressing: Secondary | ICD-10-CM | POA: Diagnosis not present

## 2022-10-09 DIAGNOSIS — D689 Coagulation defect, unspecified: Secondary | ICD-10-CM | POA: Diagnosis not present

## 2022-10-09 DIAGNOSIS — N186 End stage renal disease: Secondary | ICD-10-CM | POA: Diagnosis not present

## 2022-10-09 DIAGNOSIS — D509 Iron deficiency anemia, unspecified: Secondary | ICD-10-CM | POA: Diagnosis not present

## 2022-10-09 DIAGNOSIS — N2581 Secondary hyperparathyroidism of renal origin: Secondary | ICD-10-CM | POA: Diagnosis not present

## 2022-10-09 DIAGNOSIS — Z4800 Encounter for change or removal of nonsurgical wound dressing: Secondary | ICD-10-CM | POA: Diagnosis not present

## 2022-10-09 DIAGNOSIS — Z992 Dependence on renal dialysis: Secondary | ICD-10-CM | POA: Diagnosis not present

## 2022-10-09 DIAGNOSIS — D631 Anemia in chronic kidney disease: Secondary | ICD-10-CM | POA: Diagnosis not present

## 2022-10-12 DIAGNOSIS — D689 Coagulation defect, unspecified: Secondary | ICD-10-CM | POA: Diagnosis not present

## 2022-10-12 DIAGNOSIS — D631 Anemia in chronic kidney disease: Secondary | ICD-10-CM | POA: Diagnosis not present

## 2022-10-12 DIAGNOSIS — N186 End stage renal disease: Secondary | ICD-10-CM | POA: Diagnosis not present

## 2022-10-12 DIAGNOSIS — Z992 Dependence on renal dialysis: Secondary | ICD-10-CM | POA: Diagnosis not present

## 2022-10-12 DIAGNOSIS — Z4800 Encounter for change or removal of nonsurgical wound dressing: Secondary | ICD-10-CM | POA: Diagnosis not present

## 2022-10-12 DIAGNOSIS — N2581 Secondary hyperparathyroidism of renal origin: Secondary | ICD-10-CM | POA: Diagnosis not present

## 2022-10-12 DIAGNOSIS — D509 Iron deficiency anemia, unspecified: Secondary | ICD-10-CM | POA: Diagnosis not present

## 2022-10-13 ENCOUNTER — Other Ambulatory Visit: Payer: Self-pay | Admitting: *Deleted

## 2022-10-13 DIAGNOSIS — Z992 Dependence on renal dialysis: Secondary | ICD-10-CM

## 2022-10-14 DIAGNOSIS — Z992 Dependence on renal dialysis: Secondary | ICD-10-CM | POA: Diagnosis not present

## 2022-10-14 DIAGNOSIS — N2581 Secondary hyperparathyroidism of renal origin: Secondary | ICD-10-CM | POA: Diagnosis not present

## 2022-10-14 DIAGNOSIS — D689 Coagulation defect, unspecified: Secondary | ICD-10-CM | POA: Diagnosis not present

## 2022-10-14 DIAGNOSIS — Z4800 Encounter for change or removal of nonsurgical wound dressing: Secondary | ICD-10-CM | POA: Diagnosis not present

## 2022-10-14 DIAGNOSIS — D509 Iron deficiency anemia, unspecified: Secondary | ICD-10-CM | POA: Diagnosis not present

## 2022-10-14 DIAGNOSIS — D631 Anemia in chronic kidney disease: Secondary | ICD-10-CM | POA: Diagnosis not present

## 2022-10-14 DIAGNOSIS — N186 End stage renal disease: Secondary | ICD-10-CM | POA: Diagnosis not present

## 2022-10-16 DIAGNOSIS — D509 Iron deficiency anemia, unspecified: Secondary | ICD-10-CM | POA: Diagnosis not present

## 2022-10-16 DIAGNOSIS — Z992 Dependence on renal dialysis: Secondary | ICD-10-CM | POA: Diagnosis not present

## 2022-10-16 DIAGNOSIS — D631 Anemia in chronic kidney disease: Secondary | ICD-10-CM | POA: Diagnosis not present

## 2022-10-16 DIAGNOSIS — D689 Coagulation defect, unspecified: Secondary | ICD-10-CM | POA: Diagnosis not present

## 2022-10-16 DIAGNOSIS — N2581 Secondary hyperparathyroidism of renal origin: Secondary | ICD-10-CM | POA: Diagnosis not present

## 2022-10-16 DIAGNOSIS — Z4800 Encounter for change or removal of nonsurgical wound dressing: Secondary | ICD-10-CM | POA: Diagnosis not present

## 2022-10-16 DIAGNOSIS — N186 End stage renal disease: Secondary | ICD-10-CM | POA: Diagnosis not present

## 2022-10-17 DIAGNOSIS — Z992 Dependence on renal dialysis: Secondary | ICD-10-CM | POA: Diagnosis not present

## 2022-10-17 DIAGNOSIS — I129 Hypertensive chronic kidney disease with stage 1 through stage 4 chronic kidney disease, or unspecified chronic kidney disease: Secondary | ICD-10-CM | POA: Diagnosis not present

## 2022-10-17 DIAGNOSIS — N186 End stage renal disease: Secondary | ICD-10-CM | POA: Diagnosis not present

## 2022-10-19 DIAGNOSIS — Z4800 Encounter for change or removal of nonsurgical wound dressing: Secondary | ICD-10-CM | POA: Diagnosis not present

## 2022-10-19 DIAGNOSIS — N2581 Secondary hyperparathyroidism of renal origin: Secondary | ICD-10-CM | POA: Diagnosis not present

## 2022-10-19 DIAGNOSIS — D689 Coagulation defect, unspecified: Secondary | ICD-10-CM | POA: Diagnosis not present

## 2022-10-19 DIAGNOSIS — N186 End stage renal disease: Secondary | ICD-10-CM | POA: Diagnosis not present

## 2022-10-19 DIAGNOSIS — Z992 Dependence on renal dialysis: Secondary | ICD-10-CM | POA: Diagnosis not present

## 2022-10-21 ENCOUNTER — Other Ambulatory Visit (HOSPITAL_COMMUNITY): Payer: Self-pay

## 2022-10-21 ENCOUNTER — Other Ambulatory Visit: Payer: Self-pay

## 2022-10-21 DIAGNOSIS — Z4800 Encounter for change or removal of nonsurgical wound dressing: Secondary | ICD-10-CM | POA: Diagnosis not present

## 2022-10-21 DIAGNOSIS — N2581 Secondary hyperparathyroidism of renal origin: Secondary | ICD-10-CM | POA: Diagnosis not present

## 2022-10-21 DIAGNOSIS — N186 End stage renal disease: Secondary | ICD-10-CM | POA: Diagnosis not present

## 2022-10-21 DIAGNOSIS — D689 Coagulation defect, unspecified: Secondary | ICD-10-CM | POA: Diagnosis not present

## 2022-10-21 DIAGNOSIS — Z992 Dependence on renal dialysis: Secondary | ICD-10-CM | POA: Diagnosis not present

## 2022-10-22 ENCOUNTER — Ambulatory Visit (INDEPENDENT_AMBULATORY_CARE_PROVIDER_SITE_OTHER)
Admission: RE | Admit: 2022-10-22 | Discharge: 2022-10-22 | Disposition: A | Discharge status: Home / Self Care | Payer: 59 | Source: Ambulatory Visit | Attending: Vascular Surgery | Admitting: Vascular Surgery

## 2022-10-22 ENCOUNTER — Ambulatory Visit (HOSPITAL_COMMUNITY)
Admission: RE | Admit: 2022-10-22 | Discharge: 2022-10-22 | Disposition: A | Discharge status: Home / Self Care | Payer: 59 | Source: Ambulatory Visit | Attending: Vascular Surgery | Admitting: Vascular Surgery

## 2022-10-22 ENCOUNTER — Other Ambulatory Visit (HOSPITAL_COMMUNITY): Payer: Self-pay

## 2022-10-22 DIAGNOSIS — Z992 Dependence on renal dialysis: Secondary | ICD-10-CM | POA: Diagnosis not present

## 2022-10-22 DIAGNOSIS — N186 End stage renal disease: Secondary | ICD-10-CM | POA: Diagnosis not present

## 2022-10-23 DIAGNOSIS — N186 End stage renal disease: Secondary | ICD-10-CM | POA: Diagnosis not present

## 2022-10-23 DIAGNOSIS — Z992 Dependence on renal dialysis: Secondary | ICD-10-CM | POA: Diagnosis not present

## 2022-10-23 DIAGNOSIS — D689 Coagulation defect, unspecified: Secondary | ICD-10-CM | POA: Diagnosis not present

## 2022-10-23 DIAGNOSIS — N2581 Secondary hyperparathyroidism of renal origin: Secondary | ICD-10-CM | POA: Diagnosis not present

## 2022-10-23 DIAGNOSIS — Z4800 Encounter for change or removal of nonsurgical wound dressing: Secondary | ICD-10-CM | POA: Diagnosis not present

## 2022-10-26 DIAGNOSIS — N186 End stage renal disease: Secondary | ICD-10-CM | POA: Diagnosis not present

## 2022-10-26 DIAGNOSIS — D689 Coagulation defect, unspecified: Secondary | ICD-10-CM | POA: Diagnosis not present

## 2022-10-26 DIAGNOSIS — Z4800 Encounter for change or removal of nonsurgical wound dressing: Secondary | ICD-10-CM | POA: Diagnosis not present

## 2022-10-26 DIAGNOSIS — Z992 Dependence on renal dialysis: Secondary | ICD-10-CM | POA: Diagnosis not present

## 2022-10-26 DIAGNOSIS — N2581 Secondary hyperparathyroidism of renal origin: Secondary | ICD-10-CM | POA: Diagnosis not present

## 2022-10-27 ENCOUNTER — Other Ambulatory Visit: Payer: Self-pay

## 2022-10-29 ENCOUNTER — Other Ambulatory Visit (HOSPITAL_COMMUNITY): Payer: Self-pay

## 2022-10-30 DIAGNOSIS — N2581 Secondary hyperparathyroidism of renal origin: Secondary | ICD-10-CM | POA: Diagnosis not present

## 2022-10-30 DIAGNOSIS — D689 Coagulation defect, unspecified: Secondary | ICD-10-CM | POA: Diagnosis not present

## 2022-10-30 DIAGNOSIS — D631 Anemia in chronic kidney disease: Secondary | ICD-10-CM | POA: Diagnosis not present

## 2022-10-30 DIAGNOSIS — N186 End stage renal disease: Secondary | ICD-10-CM | POA: Diagnosis not present

## 2022-10-30 DIAGNOSIS — Z4800 Encounter for change or removal of nonsurgical wound dressing: Secondary | ICD-10-CM | POA: Diagnosis not present

## 2022-10-30 DIAGNOSIS — Z992 Dependence on renal dialysis: Secondary | ICD-10-CM | POA: Diagnosis not present

## 2022-11-02 DIAGNOSIS — N186 End stage renal disease: Secondary | ICD-10-CM | POA: Diagnosis not present

## 2022-11-02 DIAGNOSIS — N2581 Secondary hyperparathyroidism of renal origin: Secondary | ICD-10-CM | POA: Diagnosis not present

## 2022-11-02 DIAGNOSIS — D689 Coagulation defect, unspecified: Secondary | ICD-10-CM | POA: Diagnosis not present

## 2022-11-02 DIAGNOSIS — D631 Anemia in chronic kidney disease: Secondary | ICD-10-CM | POA: Diagnosis not present

## 2022-11-02 DIAGNOSIS — Z4800 Encounter for change or removal of nonsurgical wound dressing: Secondary | ICD-10-CM | POA: Diagnosis not present

## 2022-11-02 DIAGNOSIS — Z992 Dependence on renal dialysis: Secondary | ICD-10-CM | POA: Diagnosis not present

## 2022-11-04 DIAGNOSIS — D689 Coagulation defect, unspecified: Secondary | ICD-10-CM | POA: Diagnosis not present

## 2022-11-04 DIAGNOSIS — D631 Anemia in chronic kidney disease: Secondary | ICD-10-CM | POA: Diagnosis not present

## 2022-11-04 DIAGNOSIS — Z4800 Encounter for change or removal of nonsurgical wound dressing: Secondary | ICD-10-CM | POA: Diagnosis not present

## 2022-11-04 DIAGNOSIS — N186 End stage renal disease: Secondary | ICD-10-CM | POA: Diagnosis not present

## 2022-11-04 DIAGNOSIS — N2581 Secondary hyperparathyroidism of renal origin: Secondary | ICD-10-CM | POA: Diagnosis not present

## 2022-11-04 DIAGNOSIS — Z992 Dependence on renal dialysis: Secondary | ICD-10-CM | POA: Diagnosis not present

## 2022-11-06 DIAGNOSIS — D689 Coagulation defect, unspecified: Secondary | ICD-10-CM | POA: Diagnosis not present

## 2022-11-06 DIAGNOSIS — Z992 Dependence on renal dialysis: Secondary | ICD-10-CM | POA: Diagnosis not present

## 2022-11-06 DIAGNOSIS — Z4800 Encounter for change or removal of nonsurgical wound dressing: Secondary | ICD-10-CM | POA: Diagnosis not present

## 2022-11-06 DIAGNOSIS — N186 End stage renal disease: Secondary | ICD-10-CM | POA: Diagnosis not present

## 2022-11-06 DIAGNOSIS — D631 Anemia in chronic kidney disease: Secondary | ICD-10-CM | POA: Diagnosis not present

## 2022-11-06 DIAGNOSIS — N2581 Secondary hyperparathyroidism of renal origin: Secondary | ICD-10-CM | POA: Diagnosis not present

## 2022-11-09 DIAGNOSIS — Z4800 Encounter for change or removal of nonsurgical wound dressing: Secondary | ICD-10-CM | POA: Diagnosis not present

## 2022-11-09 DIAGNOSIS — D689 Coagulation defect, unspecified: Secondary | ICD-10-CM | POA: Diagnosis not present

## 2022-11-09 DIAGNOSIS — N186 End stage renal disease: Secondary | ICD-10-CM | POA: Diagnosis not present

## 2022-11-09 DIAGNOSIS — D631 Anemia in chronic kidney disease: Secondary | ICD-10-CM | POA: Diagnosis not present

## 2022-11-09 DIAGNOSIS — Z992 Dependence on renal dialysis: Secondary | ICD-10-CM | POA: Diagnosis not present

## 2022-11-09 DIAGNOSIS — N2581 Secondary hyperparathyroidism of renal origin: Secondary | ICD-10-CM | POA: Diagnosis not present

## 2022-11-09 NOTE — Progress Notes (Unsigned)
[facility] [place]  ASSESSMENT / PLAN: Samuel Burns is a 37 y.o. right handed male in need of permanent dialysis access. Plan to proceed with left upper extremity arteriovenous graft creation. Will remove the peritoneal dialysis catheter as well.   CHIEF COMPLAINT: needs hemodialysis access  HISTORY OF PRESENT ILLNESS: Samuel Burns is a 37 y.o. male who presents to clinic for evaluation of permanent dialysis access.  The patient desires peritoneal dialysis.  He is right-handed.  He has previously had hemodialysis access surgery in the arms.  He denies any abdominal surgery in the past.  We reviewed the risk, benefits, and alternatives to peritoneal dialysis.  He is understanding and wants to proceed.  11/10/22: Patient returns to clinic.  He does not have space in his home to do peritoneal dialysis.  He needs to transition to hemodialysis.  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  We reviewed his vein mapping in detail.  Past Medical History:  Diagnosis Date   ADHD (attention deficit hyperactivity disorder)    ADHD   Anemia    Anxiety    Asthma    as a child no current problems as adult   Depression    Hypertension    Renal failure    Dialysis M/W/F    Past Surgical History:  Procedure Laterality Date   AV FISTULA PLACEMENT Right 11/24/2019   Procedure: Creation of RIGHT ARM Brachial/Cephalic ARTERIOVENOUS (AV) FISTULA.;  Surgeon: [name], MD;  Location: [facility] OR;  Service: Vascular;  Laterality: Right;   AV FISTULA PLACEMENT Left 12/22/2021   Procedure: LEFT ARM BRACHIOCEPHALIC ARTERIOVENOUS (AV) FISTULA CREATION;  Surgeon: [name], MD;  Location: [facility] OR;  Service: Vascular;  Laterality: Left;   BASCILIC VEIN TRANSPOSITION Left 01/27/2022   Procedure: LEFT FIRST STAGE BASILIC VEIN TRANSPOSITION;  Surgeon: [name], MD;  Location: [facility] OR;  Service: Vascular;  Laterality: Left;  PERIPHERAL NERVE  BLOCK   CAPD INSERTION N/A 09/10/2022   Procedure: LAPAROSCOPIC INSERTION CONTINUOUS AMBULATORY PERITONEAL DIALYSIS  (CAPD) CATHETER;  Surgeon: [name], MD;  Location: [facility] OR;  Service: Vascular;  Laterality: N/A;   IR AV DIALY SHUNT INTRO NEEDLE/INTRACATH INITIAL W/PTA/IMG RIGHT Right 02/11/2021   IR DIALY SHUNT INTRO NEEDLE/INTRACATH INITIAL W/IMG RIGHT Right 04/30/2020   IR FLUORO GUIDE CV LINE LEFT  11/21/2021   IR FLUORO GUIDE CV LINE RIGHT  11/20/2019   IR REMOVAL TUN CV CATH W/O FL  06/04/2020   IR US GUIDE VASC ACCESS LEFT  11/21/2021   IR US GUIDE VASC ACCESS RIGHT  11/20/2019   IR US GUIDE VASC ACCESS RIGHT  04/30/2020   IR US GUIDE VASC ACCESS RIGHT  02/11/2021    Family History  Problem Relation Age of Onset   Hypertension Maternal Grandmother    Kidney disease Father     Social History   Socioeconomic History   Marital status: Single    Spouse name: 1   Number of children: Not on file   Years of education: Not on file   Highest education level: 12th grade  Occupational History   Not on file  Tobacco Use   Smoking status: Never    Passive exposure: Never   Smokeless tobacco: Never  Vaping Use   Vaping status: Former   Quit date: 01/01/2022  Substance and Sexual Activity   Alcohol use: No   Drug use: Not Currently   Sexual activity: Yes    Birth control/protection: Condom  Other Topics Concern   Not on file  Social History Narrative   Not on file   Social Determinants of Health   Financial Resource Strain: Low Risk  (09/22/2022)   Overall Financial Resource Strain (CARDIA)    Difficulty of Paying Living Expenses: Not hard at all  Food Insecurity: No Food Insecurity (09/22/2022)   Hunger Vital Sign    Worried About Running Out of Food in the Last Year: Never true    Ran Out of Food in the Last Year: Never true  Recent Concern: Food Insecurity - Food Insecurity Present (08/02/2022)   Hunger Vital Sign    Worried About Running Out of Food in the  Last Year: Sometimes true    Ran Out of Food in the Last Year: Never true  Transportation Needs: No Transportation Needs (09/22/2022)   PRAPARE - Administrator, Civil Service (Medical): No    Lack of Transportation (Non-Medical): No  Physical Activity: Insufficiently Active (09/22/2022)   Exercise Vital Sign    Days of Exercise per Week: 3 days    Minutes of Exercise per Session: 30 min  Stress: Stress Concern Present (09/22/2022)   [employer] of Occupational Health - Occupational Stress Questionnaire    Feeling of Stress : To some extent  Social Connections: Moderately Isolated (09/22/2022)   Social Connection and Isolation Panel [NHANES]    Frequency of Communication with Friends and Family: Three times a week    Frequency of Social Gatherings with Friends and Family: Once a week    Attends Religious Services: 1 to 4 times per year    Active Member of [employer] or Organizations: No    Attends Banker Meetings: Never    Marital Status: Never married  Intimate Partner Violence: Not At Risk (09/22/2022)   Humiliation, Afraid, Rape, and Kick questionnaire    Fear of Current or Ex-Partner: No    Emotionally Abused: No    Physically Abused: No    Sexually Abused: No    No Known Allergies  Current Outpatient Medications  Medication Sig Dispense Refill   acetaminophen (TYLENOL) 500 MG tablet Take 500-1,000 mg by mouth every 6 (six) hours as needed for moderate pain.     amLODipine (NORVASC) 10 MG tablet Take 1 tablet (10 mg total) by mouth at bedtime. 90 tablet 1   B Complex-C-Zn-Folic Acid (DIALYVITE 800-ZINC 15) 0.8 MG TABS Take 1 tablet by mouth daily. 100 tablet 1   calcitRIOL (ROCALTROL) 0.5 MCG capsule Take 0.5 mcg by mouth every Monday, Wednesday, and Friday.     cloNIDine (CATAPRES) 0.1 MG tablet Take 1 tablet (0.1 mg total) by mouth 2 (two) times daily. 60 tablet 4   fluticasone (FLONASE) 50 MCG/ACT nasal spray Place 2 sprays into both nostrils daily.  (Patient taking differently: Place 2 sprays into both nostrils in the morning.) 16 g 0   hydrOXYzine (VISTARIL) 25 MG capsule Take 1 capsule (25 mg total) by mouth in the morning and at bedtime. 30 capsule 4   losartan (COZAAR) 50 MG tablet Take 1 tablet (50 mg total) by mouth in the morning. 30 tablet 4   MELATONIN PO Take 1-2 tablets by mouth at bedtime as needed (sleep).     Methoxy PEG-Epoetin Beta (MIRCERA IJ) Mircera     Metoprolol Tartrate 75 MG TABS TAKE 1 TABLET BY MOUTH TWICE DAILY 180 tablet 0   Naphazoline HCl (CLEAR EYES OP) Place 1 drop into both eyes 2 (two) times daily as  needed (dry/irritated eyes.).     oxyCODONE-acetaminophen (PERCOCET) 5-325 MG tablet Take 1 tablet by mouth every 6 (six) hours as needed. 12 tablet 0   sertraline (ZOLOFT) 100 MG tablet Take 1 tablet (100 mg total) by mouth in the morning. 30 tablet 6   sildenafil (VIAGRA) 50 MG tablet 1 tab PO 1/2 hr prior to sex PRN.  Limit use to 1 tab/24 hr 10 tablet 2   sucroferric oxyhydroxide (VELPHORO) 500 MG chewable tablet Chew 500-1,000 mg by mouth See admin instructions. Take 2 tablets (1000 mg) by mouth 3 times daily with meals & take 1 tablet (500 mg) by mouth with snacks.     No current facility-administered medications for this visit.    PHYSICAL EXAM Vitals:   11/10/22 0848  BP: 118/80  Pulse: 90  Resp: 20  Temp: 97.7 F (36.5 C)  SpO2: 100%  Weight: 188 lb (85.3 kg)  Height: 5\' 8"  (1.727 m)     Young man in no acute distress Regular rate and rhythm Unlabored breathing Abdomen soft and nontender.  No significant scarring on the abdomen. 2+ left radial and brachial pulse  PERTINENT LABORATORY AND RADIOLOGIC DATA  Most recent CBC    Latest Ref Rng & Units 09/10/2022    9:19 AM 08/26/2022    7:18 PM 08/03/2022    3:25 AM  CBC  WBC 4.0 - 10.5 K/uL  12.5  7.1   Hemoglobin 13.0 - 17.0 g/dL 40.3  47.4  8.8   Hematocrit 39.0 - 52.0 % 35.0  36.1  27.9   Platelets 150 - 400 K/uL  295  189       Most recent CMP    Latest Ref Rng & Units 09/10/2022    9:19 AM 08/26/2022    7:18 PM 08/03/2022    3:25 AM  CMP  Glucose 70 - 99 mg/dL 75  79  74   BUN 6 - 20 mg/dL 45  19  50   Creatinine 0.61 - 1.24 mg/dL 2.59  5.63  87.56   Sodium 135 - 145 mmol/L 137  136  140   Potassium 3.5 - 5.1 mmol/L 4.5  4.6  4.7   Chloride 98 - 111 mmol/L 98  92  100   CO2 22 - 32 mmol/L  26  24   Calcium 8.9 - 10.3 mg/dL  9.7  8.8     Renal function CrCl cannot be calculated (Patient's most recent lab result is older than the maximum 21 days allowed.).  Hgb A1c MFr Bld (%)  Date Value  11/20/2019 3.5 (L)    LDL Calculated  Date Value Ref Range Status  04/08/2017 95 0 - 99 mg/dL Final   LDL Cholesterol  Date Value Ref Range Status  11/20/2019 115 (H) 0 - 99 mg/dL Final    Comment:           Total Cholesterol/HDL:CHD Risk Coronary Heart Disease Risk Table                     Men   Women  1/2 Average Risk   3.4   3.3  Average Risk       5.0   4.4  2 X Average Risk   9.6   7.1  3 X Average Risk  23.4   11.0        Use the calculated Patient Ratio above and the CHD Risk Table to determine the patient's CHD Risk.  ATP III CLASSIFICATION (LDL):  <100     mg/dL   Optimal  409-811  mg/dL   Near or Above                    Optimal  130-159  mg/dL   Borderline  914-782  mg/dL   High  >956     mg/dL   Very High Performed at [facility], [address] [place], [place] [address]     Vein mapping reviewed, no usable vein in the left upper extremity.  [name], MD FACS [facility] Phone Number: [phone] 11/10/2022 9:00 AM   Total time spent on preparing this encounter including chart review, data review, collecting history, examining the patient, coordinating care for this established patient, 40 minutes.  Portions of this report may have been transcribed using voice recognition software.  Every effort has been made to  ensure accuracy; however, inadvertent computerized transcription errors may still be present.

## 2022-11-09 NOTE — H&P (View-Only) (Signed)
[facility]ISTS OF [place]  ASSESSMENT / PLAN: Samuel Burns is a 37 y.o. right handed male in need of permanent dialysis access. Plan to proceed with left upper extremity arteriovenous graft creation. Will remove the peritoneal dialysis catheter as well.   CHIEF COMPLAINT: needs hemodialysis access  HISTORY OF PRESENT ILLNESS: Samuel Burns is a 37 y.o. male who presents to clinic for evaluation of permanent dialysis access.  The patient desires peritoneal dialysis.  He is right-handed.  He has previously had hemodialysis access surgery in the arms.  He denies any abdominal surgery in the past.  We reviewed the risk, benefits, and alternatives to peritoneal dialysis.  He is understanding and wants to proceed.  11/10/22: Patient returns to clinic.  He does not have space in his home to do peritoneal dialysis.  He needs to transition to hemodialysis.  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  We reviewed his vein mapping in detail.  Past Medical History:  Diagnosis Date   ADHD (attention deficit hyperactivity disorder)    ADHD   Anemia    Anxiety    Asthma    as a child no current problems as adult   Depression    Hypertension    Renal failure    Dialysis M/W/F    Past Surgical History:  Procedure Laterality Date   AV FISTULA PLACEMENT Right 11/24/2019   Procedure: Creation of RIGHT ARM Brachial/Cephalic ARTERIOVENOUS (AV) FISTULA.;  Surgeon: [name], MD;  Location: [facility] OR;  Service: Vascular;  Laterality: Right;   AV FISTULA PLACEMENT Left 12/22/2021   Procedure: LEFT ARM BRACHIOCEPHALIC ARTERIOVENOUS (AV) FISTULA CREATION;  Surgeon: [name], MD;  Location: [facility] OR;  Service: Vascular;  Laterality: Left;   BASCILIC VEIN TRANSPOSITION Left 01/27/2022   Procedure: LEFT FIRST STAGE BASILIC VEIN TRANSPOSITION;  Surgeon: [name], MD;  Location: [facility] OR;  Service: Vascular;  Laterality: Left;  PERIPHERAL NERVE  BLOCK   CAPD INSERTION N/A 09/10/2022   Procedure: LAPAROSCOPIC INSERTION CONTINUOUS AMBULATORY PERITONEAL DIALYSIS  (CAPD) CATHETER;  Surgeon: [name], MD;  Location: [facility] OR;  Service: Vascular;  Laterality: N/A;   IR AV DIALY SHUNT INTRO NEEDLE/INTRACATH INITIAL W/PTA/IMG RIGHT Right 02/11/2021   IR DIALY SHUNT INTRO NEEDLE/INTRACATH INITIAL W/IMG RIGHT Right 04/30/2020   IR FLUORO GUIDE CV LINE LEFT  11/21/2021   IR FLUORO GUIDE CV LINE RIGHT  11/20/2019   IR REMOVAL TUN CV CATH W/O FL  06/04/2020   IR US GUIDE VASC ACCESS LEFT  11/21/2021   IR US GUIDE VASC ACCESS RIGHT  11/20/2019   IR US GUIDE VASC ACCESS RIGHT  04/30/2020   IR US GUIDE VASC ACCESS RIGHT  02/11/2021    Family History  Problem Relation Age of Onset   Hypertension Maternal Grandmother    Kidney disease Father     Social History   Socioeconomic History   Marital status: Single    Spouse name: 1   Number of children: Not on file   Years of education: Not on file   Highest education level: 12th grade  Occupational History   Not on file  Tobacco Use   Smoking status: Never    Passive exposure: Never   Smokeless tobacco: Never  Vaping Use   Vaping status: Former   Quit date: 01/01/2022  Substance and Sexual Activity   Alcohol use: No   Drug use: Not Currently   Sexual activity: Yes    Birth control/protection: Condom  Other Topics Concern   Not on file  Social History Narrative   Not on file   Social Determinants of Health   Financial Resource Strain: Low Risk  (09/22/2022)   Overall Financial Resource Strain (CARDIA)    Difficulty of Paying Living Expenses: Not hard at all  Food Insecurity: No Food Insecurity (09/22/2022)   Hunger Vital Sign    Worried About Running Out of Food in the Last Year: Never true    Ran Out of Food in the Last Year: Never true  Recent Concern: Food Insecurity - Food Insecurity Present (08/02/2022)   Hunger Vital Sign    Worried About Running Out of Food in the  Last Year: Sometimes true    Ran Out of Food in the Last Year: Never true  Transportation Needs: No Transportation Needs (09/22/2022)   PRAPARE - Administrator, Civil Service (Medical): No    Lack of Transportation (Non-Medical): No  Physical Activity: Insufficiently Active (09/22/2022)   Exercise Vital Sign    Days of Exercise per Week: 3 days    Minutes of Exercise per Session: 30 min  Stress: Stress Concern Present (09/22/2022)   [employer] of Occupational Health - Occupational Stress Questionnaire    Feeling of Stress : To some extent  Social Connections: Moderately Isolated (09/22/2022)   Social Connection and Isolation Panel [NHANES]    Frequency of Communication with Friends and Family: Three times a week    Frequency of Social Gatherings with Friends and Family: Once a week    Attends Religious Services: 1 to 4 times per year    Active Member of [employer] or Organizations: No    Attends Banker Meetings: Never    Marital Status: Never married  Intimate Partner Violence: Not At Risk (09/22/2022)   Humiliation, Afraid, Rape, and Kick questionnaire    Fear of Current or Ex-Partner: No    Emotionally Abused: No    Physically Abused: No    Sexually Abused: No    No Known Allergies  Current Outpatient Medications  Medication Sig Dispense Refill   acetaminophen (TYLENOL) 500 MG tablet Take 500-1,000 mg by mouth every 6 (six) hours as needed for moderate pain.     amLODipine (NORVASC) 10 MG tablet Take 1 tablet (10 mg total) by mouth at bedtime. 90 tablet 1   B Complex-C-Zn-Folic Acid (DIALYVITE 800-ZINC 15) 0.8 MG TABS Take 1 tablet by mouth daily. 100 tablet 1   calcitRIOL (ROCALTROL) 0.5 MCG capsule Take 0.5 mcg by mouth every Monday, Wednesday, and Friday.     cloNIDine (CATAPRES) 0.1 MG tablet Take 1 tablet (0.1 mg total) by mouth 2 (two) times daily. 60 tablet 4   fluticasone (FLONASE) 50 MCG/ACT nasal spray Place 2 sprays into both nostrils daily.  (Patient taking differently: Place 2 sprays into both nostrils in the morning.) 16 g 0   hydrOXYzine (VISTARIL) 25 MG capsule Take 1 capsule (25 mg total) by mouth in the morning and at bedtime. 30 capsule 4   losartan (COZAAR) 50 MG tablet Take 1 tablet (50 mg total) by mouth in the morning. 30 tablet 4   MELATONIN PO Take 1-2 tablets by mouth at bedtime as needed (sleep).     Methoxy PEG-Epoetin Beta (MIRCERA IJ) Mircera     Metoprolol Tartrate 75 MG TABS TAKE 1 TABLET BY MOUTH TWICE DAILY 180 tablet 0   Naphazoline HCl (CLEAR EYES OP) Place 1 drop into both eyes 2 (two) times daily as  needed (dry/irritated eyes.).     oxyCODONE-acetaminophen (PERCOCET) 5-325 MG tablet Take 1 tablet by mouth every 6 (six) hours as needed. 12 tablet 0   sertraline (ZOLOFT) 100 MG tablet Take 1 tablet (100 mg total) by mouth in the morning. 30 tablet 6   sildenafil (VIAGRA) 50 MG tablet 1 tab PO 1/2 hr prior to sex PRN.  Limit use to 1 tab/24 hr 10 tablet 2   sucroferric oxyhydroxide (VELPHORO) 500 MG chewable tablet Chew 500-1,000 mg by mouth See admin instructions. Take 2 tablets (1000 mg) by mouth 3 times daily with meals & take 1 tablet (500 mg) by mouth with snacks.     No current facility-administered medications for this visit.    PHYSICAL EXAM Vitals:   11/10/22 0848  BP: 118/80  Pulse: 90  Resp: 20  Temp: 97.7 F (36.5 C)  SpO2: 100%  Weight: 188 lb (85.3 kg)  Height: 5\' 8"  (1.727 m)     Young man in no acute distress Regular rate and rhythm Unlabored breathing Abdomen soft and nontender.  No significant scarring on the abdomen. 2+ left radial and brachial pulse  PERTINENT LABORATORY AND RADIOLOGIC DATA  Most recent CBC    Latest Ref Rng & Units 09/10/2022    9:19 AM 08/26/2022    7:18 PM 08/03/2022    3:25 AM  CBC  WBC 4.0 - 10.5 K/uL  12.5  7.1   Hemoglobin 13.0 - 17.0 g/dL 16.1  09.6  8.8   Hematocrit 39.0 - 52.0 % 35.0  36.1  27.9   Platelets 150 - 400 K/uL  295  189       Most recent CMP    Latest Ref Rng & Units 09/10/2022    9:19 AM 08/26/2022    7:18 PM 08/03/2022    3:25 AM  CMP  Glucose 70 - 99 mg/dL 75  79  74   BUN 6 - 20 mg/dL 45  19  50   Creatinine 0.61 - 1.24 mg/dL 0.45  4.09  81.19   Sodium 135 - 145 mmol/L 137  136  140   Potassium 3.5 - 5.1 mmol/L 4.5  4.6  4.7   Chloride 98 - 111 mmol/L 98  92  100   CO2 22 - 32 mmol/L  26  24   Calcium 8.9 - 10.3 mg/dL  9.7  8.8     Renal function CrCl cannot be calculated (Patient's most recent lab result is older than the maximum 21 days allowed.).  Hgb A1c MFr Bld (%)  Date Value  11/20/2019 3.5 (L)    LDL Calculated  Date Value Ref Range Status  04/08/2017 95 0 - 99 mg/dL Final   LDL Cholesterol  Date Value Ref Range Status  11/20/2019 115 (H) 0 - 99 mg/dL Final    Comment:           Total Cholesterol/HDL:CHD Risk Coronary Heart Disease Risk Table                     Men   Women  1/2 Average Risk   3.4   3.3  Average Risk       5.0   4.4  2 X Average Risk   9.6   7.1  3 X Average Risk  23.4   11.0        Use the calculated Patient Ratio above and the CHD Risk Table to determine the patient's CHD Risk.  ATP III CLASSIFICATION (LDL):  <100     mg/dL   Optimal  914-782  mg/dL   Near or Above                    Optimal  130-159  mg/dL   Borderline  956-213  mg/dL   High  >086     mg/dL   Very High Performed at [facility], [address] [place], [place] [address]     Vein mapping reviewed, no usable vein in the left upper extremity.  [name], MD FACS [facility] Phone Number: [phone] 11/10/2022 9:00 AM   Total time spent on preparing this encounter including chart review, data review, collecting history, examining the patient, coordinating care for this established patient, 40 minutes.  Portions of this report may have been transcribed using voice recognition software.  Every effort has been made to  ensure accuracy; however, inadvertent computerized transcription errors may still be present.

## 2022-11-10 ENCOUNTER — Encounter: Payer: Self-pay | Admitting: Vascular Surgery

## 2022-11-10 ENCOUNTER — Ambulatory Visit (INDEPENDENT_AMBULATORY_CARE_PROVIDER_SITE_OTHER): Payer: 59 | Admitting: Vascular Surgery

## 2022-11-10 VITALS — BP 118/80 | HR 90 | Temp 97.7°F | Resp 20 | Ht 68.0 in | Wt 188.0 lb

## 2022-11-10 DIAGNOSIS — Z992 Dependence on renal dialysis: Secondary | ICD-10-CM | POA: Diagnosis not present

## 2022-11-10 DIAGNOSIS — N186 End stage renal disease: Secondary | ICD-10-CM

## 2022-11-11 DIAGNOSIS — D689 Coagulation defect, unspecified: Secondary | ICD-10-CM | POA: Diagnosis not present

## 2022-11-11 DIAGNOSIS — Z992 Dependence on renal dialysis: Secondary | ICD-10-CM | POA: Diagnosis not present

## 2022-11-11 DIAGNOSIS — Z4800 Encounter for change or removal of nonsurgical wound dressing: Secondary | ICD-10-CM | POA: Diagnosis not present

## 2022-11-11 DIAGNOSIS — D631 Anemia in chronic kidney disease: Secondary | ICD-10-CM | POA: Diagnosis not present

## 2022-11-11 DIAGNOSIS — N2581 Secondary hyperparathyroidism of renal origin: Secondary | ICD-10-CM | POA: Diagnosis not present

## 2022-11-11 DIAGNOSIS — N186 End stage renal disease: Secondary | ICD-10-CM | POA: Diagnosis not present

## 2022-11-13 DIAGNOSIS — Z4800 Encounter for change or removal of nonsurgical wound dressing: Secondary | ICD-10-CM | POA: Diagnosis not present

## 2022-11-13 DIAGNOSIS — D689 Coagulation defect, unspecified: Secondary | ICD-10-CM | POA: Diagnosis not present

## 2022-11-13 DIAGNOSIS — N2581 Secondary hyperparathyroidism of renal origin: Secondary | ICD-10-CM | POA: Diagnosis not present

## 2022-11-13 DIAGNOSIS — N186 End stage renal disease: Secondary | ICD-10-CM | POA: Diagnosis not present

## 2022-11-13 DIAGNOSIS — Z992 Dependence on renal dialysis: Secondary | ICD-10-CM | POA: Diagnosis not present

## 2022-11-13 DIAGNOSIS — D631 Anemia in chronic kidney disease: Secondary | ICD-10-CM | POA: Diagnosis not present

## 2022-11-16 ENCOUNTER — Telehealth: Payer: Self-pay

## 2022-11-16 DIAGNOSIS — D631 Anemia in chronic kidney disease: Secondary | ICD-10-CM | POA: Diagnosis not present

## 2022-11-16 DIAGNOSIS — Z992 Dependence on renal dialysis: Secondary | ICD-10-CM | POA: Diagnosis not present

## 2022-11-16 DIAGNOSIS — D689 Coagulation defect, unspecified: Secondary | ICD-10-CM | POA: Diagnosis not present

## 2022-11-16 DIAGNOSIS — N186 End stage renal disease: Secondary | ICD-10-CM | POA: Diagnosis not present

## 2022-11-16 DIAGNOSIS — Z4800 Encounter for change or removal of nonsurgical wound dressing: Secondary | ICD-10-CM | POA: Diagnosis not present

## 2022-11-16 DIAGNOSIS — I129 Hypertensive chronic kidney disease with stage 1 through stage 4 chronic kidney disease, or unspecified chronic kidney disease: Secondary | ICD-10-CM | POA: Diagnosis not present

## 2022-11-16 DIAGNOSIS — N2581 Secondary hyperparathyroidism of renal origin: Secondary | ICD-10-CM | POA: Diagnosis not present

## 2022-11-16 NOTE — Telephone Encounter (Signed)
Attempted to reach pt to schedule his surgery. Left him voicemails on both numbers listed asking that he return our call.

## 2022-11-18 DIAGNOSIS — Z4800 Encounter for change or removal of nonsurgical wound dressing: Secondary | ICD-10-CM | POA: Diagnosis not present

## 2022-11-18 DIAGNOSIS — N186 End stage renal disease: Secondary | ICD-10-CM | POA: Diagnosis not present

## 2022-11-18 DIAGNOSIS — N2581 Secondary hyperparathyroidism of renal origin: Secondary | ICD-10-CM | POA: Diagnosis not present

## 2022-11-18 DIAGNOSIS — Z992 Dependence on renal dialysis: Secondary | ICD-10-CM | POA: Diagnosis not present

## 2022-11-18 DIAGNOSIS — D689 Coagulation defect, unspecified: Secondary | ICD-10-CM | POA: Diagnosis not present

## 2022-11-18 DIAGNOSIS — D509 Iron deficiency anemia, unspecified: Secondary | ICD-10-CM | POA: Diagnosis not present

## 2022-11-19 ENCOUNTER — Other Ambulatory Visit: Payer: Self-pay

## 2022-11-19 DIAGNOSIS — N186 End stage renal disease: Secondary | ICD-10-CM

## 2022-11-19 NOTE — Telephone Encounter (Signed)
Patient returned call. Scheduled surgery for 10/17. Instructions provided and will be sent via MyChart per patient request. He voiced understanding.

## 2022-11-20 DIAGNOSIS — Z4800 Encounter for change or removal of nonsurgical wound dressing: Secondary | ICD-10-CM | POA: Diagnosis not present

## 2022-11-20 DIAGNOSIS — Z992 Dependence on renal dialysis: Secondary | ICD-10-CM | POA: Diagnosis not present

## 2022-11-20 DIAGNOSIS — D689 Coagulation defect, unspecified: Secondary | ICD-10-CM | POA: Diagnosis not present

## 2022-11-20 DIAGNOSIS — N2581 Secondary hyperparathyroidism of renal origin: Secondary | ICD-10-CM | POA: Diagnosis not present

## 2022-11-20 DIAGNOSIS — N186 End stage renal disease: Secondary | ICD-10-CM | POA: Diagnosis not present

## 2022-11-20 DIAGNOSIS — D509 Iron deficiency anemia, unspecified: Secondary | ICD-10-CM | POA: Diagnosis not present

## 2022-11-23 DIAGNOSIS — Z4800 Encounter for change or removal of nonsurgical wound dressing: Secondary | ICD-10-CM | POA: Diagnosis not present

## 2022-11-23 DIAGNOSIS — Z992 Dependence on renal dialysis: Secondary | ICD-10-CM | POA: Diagnosis not present

## 2022-11-23 DIAGNOSIS — N2581 Secondary hyperparathyroidism of renal origin: Secondary | ICD-10-CM | POA: Diagnosis not present

## 2022-11-23 DIAGNOSIS — N186 End stage renal disease: Secondary | ICD-10-CM | POA: Diagnosis not present

## 2022-11-23 DIAGNOSIS — D509 Iron deficiency anemia, unspecified: Secondary | ICD-10-CM | POA: Diagnosis not present

## 2022-11-23 DIAGNOSIS — D689 Coagulation defect, unspecified: Secondary | ICD-10-CM | POA: Diagnosis not present

## 2022-11-25 DIAGNOSIS — N186 End stage renal disease: Secondary | ICD-10-CM | POA: Diagnosis not present

## 2022-11-25 DIAGNOSIS — Z992 Dependence on renal dialysis: Secondary | ICD-10-CM | POA: Diagnosis not present

## 2022-11-25 DIAGNOSIS — D689 Coagulation defect, unspecified: Secondary | ICD-10-CM | POA: Diagnosis not present

## 2022-11-25 DIAGNOSIS — D509 Iron deficiency anemia, unspecified: Secondary | ICD-10-CM | POA: Diagnosis not present

## 2022-11-25 DIAGNOSIS — N2581 Secondary hyperparathyroidism of renal origin: Secondary | ICD-10-CM | POA: Diagnosis not present

## 2022-11-25 DIAGNOSIS — Z4800 Encounter for change or removal of nonsurgical wound dressing: Secondary | ICD-10-CM | POA: Diagnosis not present

## 2022-11-26 ENCOUNTER — Ambulatory Visit: Payer: 59 | Admitting: Neurology

## 2022-11-26 ENCOUNTER — Encounter: Payer: Self-pay | Admitting: Neurology

## 2022-11-27 DIAGNOSIS — D689 Coagulation defect, unspecified: Secondary | ICD-10-CM | POA: Diagnosis not present

## 2022-11-27 DIAGNOSIS — Z992 Dependence on renal dialysis: Secondary | ICD-10-CM | POA: Diagnosis not present

## 2022-11-27 DIAGNOSIS — Z4800 Encounter for change or removal of nonsurgical wound dressing: Secondary | ICD-10-CM | POA: Diagnosis not present

## 2022-11-27 DIAGNOSIS — D509 Iron deficiency anemia, unspecified: Secondary | ICD-10-CM | POA: Diagnosis not present

## 2022-11-27 DIAGNOSIS — N186 End stage renal disease: Secondary | ICD-10-CM | POA: Diagnosis not present

## 2022-11-27 DIAGNOSIS — N2581 Secondary hyperparathyroidism of renal origin: Secondary | ICD-10-CM | POA: Diagnosis not present

## 2022-11-30 DIAGNOSIS — D689 Coagulation defect, unspecified: Secondary | ICD-10-CM | POA: Diagnosis not present

## 2022-11-30 DIAGNOSIS — N2581 Secondary hyperparathyroidism of renal origin: Secondary | ICD-10-CM | POA: Diagnosis not present

## 2022-11-30 DIAGNOSIS — N186 End stage renal disease: Secondary | ICD-10-CM | POA: Diagnosis not present

## 2022-11-30 DIAGNOSIS — D509 Iron deficiency anemia, unspecified: Secondary | ICD-10-CM | POA: Diagnosis not present

## 2022-11-30 DIAGNOSIS — Z992 Dependence on renal dialysis: Secondary | ICD-10-CM | POA: Diagnosis not present

## 2022-11-30 DIAGNOSIS — Z4800 Encounter for change or removal of nonsurgical wound dressing: Secondary | ICD-10-CM | POA: Diagnosis not present

## 2022-12-02 ENCOUNTER — Other Ambulatory Visit: Payer: Self-pay

## 2022-12-02 ENCOUNTER — Encounter (HOSPITAL_COMMUNITY): Payer: Self-pay | Admitting: Vascular Surgery

## 2022-12-02 DIAGNOSIS — D509 Iron deficiency anemia, unspecified: Secondary | ICD-10-CM | POA: Diagnosis not present

## 2022-12-02 DIAGNOSIS — Z992 Dependence on renal dialysis: Secondary | ICD-10-CM | POA: Diagnosis not present

## 2022-12-02 DIAGNOSIS — N186 End stage renal disease: Secondary | ICD-10-CM | POA: Diagnosis not present

## 2022-12-02 DIAGNOSIS — Z4800 Encounter for change or removal of nonsurgical wound dressing: Secondary | ICD-10-CM | POA: Diagnosis not present

## 2022-12-02 DIAGNOSIS — D689 Coagulation defect, unspecified: Secondary | ICD-10-CM | POA: Diagnosis not present

## 2022-12-02 DIAGNOSIS — N2581 Secondary hyperparathyroidism of renal origin: Secondary | ICD-10-CM | POA: Diagnosis not present

## 2022-12-02 NOTE — Progress Notes (Signed)
SDW CALL  Patient was given pre-op instructions over the phone. The opportunity was given for the patient to ask questions. No further questions asked. Patient verbalized understanding of instructions given.   PCP - Jonah Blue MD Cardiologist -   PPM/ICD -  Device Orders -  Rep Notified -   Chest x-ray -  EKG -  Stress Test -  ECHO -  Cardiac Cath -   Sleep Study -  CPAP -   Fasting Blood Sugar -  Checks Blood Sugar _____ times a day  Blood Thinner Instructions:na Aspirin Instructions:  ERAS Protcol -no PRE-SURGERY Ensure or G2-   COVID TEST- na   Anesthesia review:   Patient denies shortness of breath, fever, cough and chest pain over the phone call    Surgical Instructions    Your procedure is scheduled on October 17  Report to [facility] Main Entrance "A" at 0645 A.M., then check in with the Admitting office.  Call this number if you have problems the morning of surgery:  [phone]    Remember:  Do not eat or drink anything after midnight the night before your surgery   Take these medicines the morning of surgery with A SIP OF WATER: Clonidine,Hydroxyzine,Metoprolol,clear eyes eye drops,sertraline.  PRN- Tylenol, Flonase   As of today, STOP taking any Aspirin (unless otherwise instructed by your surgeon) Aleve, Naproxen, Ibuprofen, Motrin, Advil, Goody's, BC's, all herbal medications, fish oil, and all vitamins.  [facility] is not responsible for any belongings or valuables. .   Do NOT Smoke (Tobacco/Vaping)  24 hours prior to your procedure  If you use a CPAP at night, you may bring your mask for your overnight stay.   Contacts, glasses, hearing aids, dentures or partials may not be worn into surgery, please bring cases for these belongings   Patients discharged the day of surgery will not be allowed to drive home, and someone needs to stay with them for 24 hours.   Special instructions:    Oral Hygiene is also important to reduce your  risk of infection.  Remember - BRUSH YOUR TEETH THE MORNING OF SURGERY WITH YOUR REGULAR TOOTHPASTE   Day of Surgery:  Take a shower the day of or night before with antibacterial soap. Wear Clean/Comfortable clothing the morning of surgery Do not apply any deodorants/lotions.   Do not wear jewelry or makeup Do not wear lotions, powders, perfumes/colognes, or deodorant. Do not shave 48 hours prior to surgery.  Men may shave face and neck. Do not bring valuables to the hospital. Do not wear nail polish, gel polish, artificial nails, or any other type of covering on natural nails (fingers and toes) If you have artificial nails or gel coating that need to be removed by a nail salon, please have this removed prior to surgery. Artificial nails or gel coating may interfere with anesthesia's ability to adequately monitor your vital signs. Remember to brush your teeth WITH YOUR REGULAR TOOTHPASTE.

## 2022-12-03 ENCOUNTER — Ambulatory Visit (HOSPITAL_COMMUNITY)
Admission: RE | Admit: 2022-12-03 | Discharge: 2022-12-03 | Disposition: A | Discharge status: Home / Self Care | Payer: 59 | Attending: Vascular Surgery | Admitting: Vascular Surgery

## 2022-12-03 ENCOUNTER — Other Ambulatory Visit: Payer: Self-pay

## 2022-12-03 ENCOUNTER — Ambulatory Visit (HOSPITAL_COMMUNITY): Payer: 59 | Admitting: Anesthesiology

## 2022-12-03 ENCOUNTER — Encounter (HOSPITAL_COMMUNITY): Payer: Self-pay | Admitting: Vascular Surgery

## 2022-12-03 ENCOUNTER — Ambulatory Visit (HOSPITAL_BASED_OUTPATIENT_CLINIC_OR_DEPARTMENT_OTHER): Payer: 59 | Admitting: Anesthesiology

## 2022-12-03 ENCOUNTER — Encounter (HOSPITAL_COMMUNITY)
Admission: RE | Disposition: A | Discharge status: Home / Self Care | Payer: Self-pay | Source: Home / Self Care | Attending: Vascular Surgery

## 2022-12-03 DIAGNOSIS — X58XXXA Exposure to other specified factors, initial encounter: Secondary | ICD-10-CM | POA: Diagnosis not present

## 2022-12-03 DIAGNOSIS — D631 Anemia in chronic kidney disease: Secondary | ICD-10-CM | POA: Diagnosis not present

## 2022-12-03 DIAGNOSIS — Z992 Dependence on renal dialysis: Secondary | ICD-10-CM | POA: Insufficient documentation

## 2022-12-03 DIAGNOSIS — J45909 Unspecified asthma, uncomplicated: Secondary | ICD-10-CM | POA: Diagnosis not present

## 2022-12-03 DIAGNOSIS — Z87891 Personal history of nicotine dependence: Secondary | ICD-10-CM | POA: Insufficient documentation

## 2022-12-03 DIAGNOSIS — I120 Hypertensive chronic kidney disease with stage 5 chronic kidney disease or end stage renal disease: Secondary | ICD-10-CM | POA: Diagnosis not present

## 2022-12-03 DIAGNOSIS — N185 Chronic kidney disease, stage 5: Secondary | ICD-10-CM | POA: Diagnosis not present

## 2022-12-03 DIAGNOSIS — Z79899 Other long term (current) drug therapy: Secondary | ICD-10-CM | POA: Insufficient documentation

## 2022-12-03 DIAGNOSIS — N186 End stage renal disease: Secondary | ICD-10-CM | POA: Diagnosis not present

## 2022-12-03 DIAGNOSIS — T85898A Other specified complication of other internal prosthetic devices, implants and grafts, initial encounter: Secondary | ICD-10-CM | POA: Insufficient documentation

## 2022-12-03 DIAGNOSIS — I517 Cardiomegaly: Secondary | ICD-10-CM | POA: Diagnosis not present

## 2022-12-03 DIAGNOSIS — I3139 Other pericardial effusion (noninflammatory): Secondary | ICD-10-CM | POA: Insufficient documentation

## 2022-12-03 HISTORY — PX: CAPD REMOVAL: SHX5234

## 2022-12-03 HISTORY — PX: AV FISTULA PLACEMENT: SHX1204

## 2022-12-03 LAB — POCT I-STAT, CHEM 8
BUN: 42 mg/dL — ABNORMAL HIGH (ref 6–20)
Calcium, Ion: 0.96 mmol/L — ABNORMAL LOW (ref 1.15–1.40)
Chloride: 100 mmol/L (ref 98–111)
Creatinine, Ser: 10.3 mg/dL — ABNORMAL HIGH (ref 0.61–1.24)
Glucose, Bld: 83 mg/dL (ref 70–99)
HCT: 37 % — ABNORMAL LOW (ref 39.0–52.0)
Hemoglobin: 12.6 g/dL — ABNORMAL LOW (ref 13.0–17.0)
Potassium: 4.7 mmol/L (ref 3.5–5.1)
Sodium: 139 mmol/L (ref 135–145)
TCO2: 30 mmol/L (ref 22–32)

## 2022-12-03 SURGERY — INSERTION OF ARTERIOVENOUS (AV) GORE-TEX GRAFT ARM
Anesthesia: General | Site: Arm Upper

## 2022-12-03 MED ORDER — ACETAMINOPHEN 160 MG/5ML PO SOLN: 1000.0000 mg | Freq: Once | ORAL | Status: DC | PRN

## 2022-12-03 MED ORDER — CEFAZOLIN SODIUM-DEXTROSE 2-4 GM/100ML-% IV SOLN
2.0000 g | INTRAVENOUS | Status: AC
  Administered 2022-12-03: 2 g via INTRAVENOUS
  Filled 2022-12-03: qty 100

## 2022-12-03 MED ORDER — HEPARIN 6000 UNIT IRRIGATION SOLUTION
Status: DC | PRN
  Administered 2022-12-03: 1

## 2022-12-03 MED ORDER — 0.9 % SODIUM CHLORIDE (POUR BTL) OPTIME
TOPICAL | Status: DC | PRN
  Administered 2022-12-03: 1000 mL

## 2022-12-03 MED ORDER — LIDOCAINE 2% (20 MG/ML) 5 ML SYRINGE
INTRAMUSCULAR | Status: DC | PRN
  Administered 2022-12-03: 60 mg via INTRAVENOUS

## 2022-12-03 MED ORDER — LIDOCAINE 2% (20 MG/ML) 5 ML SYRINGE
INTRAMUSCULAR | Status: AC
  Filled 2022-12-03: qty 10

## 2022-12-03 MED ORDER — EPHEDRINE SULFATE-NACL 50-0.9 MG/10ML-% IV SOSY
PREFILLED_SYRINGE | INTRAVENOUS | Status: DC | PRN
  Administered 2022-12-03: 5 mg via INTRAVENOUS

## 2022-12-03 MED ORDER — OXYCODONE HCL 5 MG/5ML PO SOLN
ORAL | Status: AC
  Filled 2022-12-03: qty 5

## 2022-12-03 MED ORDER — PROPOFOL 10 MG/ML IV BOLUS
INTRAVENOUS | Status: DC | PRN
  Administered 2022-12-03: 40 mg via INTRAVENOUS
  Administered 2022-12-03: 160 mg via INTRAVENOUS

## 2022-12-03 MED ORDER — DEXAMETHASONE SODIUM PHOSPHATE 10 MG/ML IJ SOLN
INTRAMUSCULAR | Status: DC | PRN
  Administered 2022-12-03: 10 mg via INTRAVENOUS

## 2022-12-03 MED ORDER — OXYCODONE-ACETAMINOPHEN 5-325 MG PO TABS
1.0000 | ORAL_TABLET | Freq: Four times a day (QID) | ORAL | 0 refills | Status: DC | PRN
Start: 2022-12-03 — End: 2023-04-05

## 2022-12-03 MED ORDER — FENTANYL CITRATE (PF) 100 MCG/2ML IJ SOLN
INTRAMUSCULAR | Status: AC
  Filled 2022-12-03: qty 2

## 2022-12-03 MED ORDER — CHLORHEXIDINE GLUCONATE 0.12 % MT SOLN
15.0000 mL | Freq: Once | OROMUCOSAL | Status: AC
  Administered 2022-12-03: 15 mL via OROMUCOSAL
  Filled 2022-12-03: qty 15

## 2022-12-03 MED ORDER — ROCURONIUM BROMIDE 10 MG/ML (PF) SYRINGE
PREFILLED_SYRINGE | INTRAVENOUS | Status: AC
  Filled 2022-12-03: qty 10

## 2022-12-03 MED ORDER — ACETAMINOPHEN 10 MG/ML IV SOLN
INTRAVENOUS | Status: AC
  Filled 2022-12-03: qty 100

## 2022-12-03 MED ORDER — MIDAZOLAM HCL 2 MG/2ML IJ SOLN
INTRAMUSCULAR | Status: AC
  Filled 2022-12-03: qty 2

## 2022-12-03 MED ORDER — MIDAZOLAM HCL 2 MG/2ML IJ SOLN
INTRAMUSCULAR | Status: DC | PRN
  Administered 2022-12-03: 1 mg via INTRAVENOUS

## 2022-12-03 MED ORDER — ROCURONIUM BROMIDE 10 MG/ML (PF) SYRINGE
PREFILLED_SYRINGE | INTRAVENOUS | Status: DC | PRN
  Administered 2022-12-03: 20 mg via INTRAVENOUS
  Administered 2022-12-03: 50 mg via INTRAVENOUS

## 2022-12-03 MED ORDER — FENTANYL CITRATE (PF) 250 MCG/5ML IJ SOLN
INTRAMUSCULAR | Status: DC | PRN
  Administered 2022-12-03 (×4): 50 ug via INTRAVENOUS

## 2022-12-03 MED ORDER — FENTANYL CITRATE (PF) 100 MCG/2ML IJ SOLN
25.0000 ug | INTRAMUSCULAR | Status: DC | PRN
  Administered 2022-12-03 (×3): 50 ug via INTRAVENOUS

## 2022-12-03 MED ORDER — CHLORHEXIDINE GLUCONATE 4 % EX SOLN: 60.0000 mL | Freq: Once | CUTANEOUS | Status: DC

## 2022-12-03 MED ORDER — ONDANSETRON HCL 4 MG/2ML IJ SOLN
INTRAMUSCULAR | Status: DC | PRN
  Administered 2022-12-03: 4 mg via INTRAVENOUS

## 2022-12-03 MED ORDER — SUGAMMADEX SODIUM 200 MG/2ML IV SOLN
INTRAVENOUS | Status: DC | PRN
  Administered 2022-12-03: 173.2 mg via INTRAVENOUS

## 2022-12-03 MED ORDER — ACETAMINOPHEN 500 MG PO TABS: 1000.0000 mg | ORAL_TABLET | Freq: Once | ORAL | Status: DC | PRN

## 2022-12-03 MED ORDER — ACETAMINOPHEN 10 MG/ML IV SOLN
1000.0000 mg | Freq: Once | INTRAVENOUS | Status: DC | PRN
  Administered 2022-12-03: 1000 mg via INTRAVENOUS

## 2022-12-03 MED ORDER — HEPARIN SODIUM (PORCINE) 1000 UNIT/ML IJ SOLN
INTRAMUSCULAR | Status: AC
  Filled 2022-12-03: qty 10

## 2022-12-03 MED ORDER — ONDANSETRON HCL 4 MG/2ML IJ SOLN
INTRAMUSCULAR | Status: AC
  Filled 2022-12-03: qty 4

## 2022-12-03 MED ORDER — SODIUM CHLORIDE 0.9 % IV SOLN: INTRAVENOUS | Status: DC

## 2022-12-03 MED ORDER — HEPARIN 6000 UNIT IRRIGATION SOLUTION
Status: AC
  Filled 2022-12-03: qty 500

## 2022-12-03 MED ORDER — ORAL CARE MOUTH RINSE: 15.0000 mL | Freq: Once | OROMUCOSAL | Status: AC

## 2022-12-03 MED ORDER — OXYCODONE HCL 5 MG PO TABS: 5.0000 mg | ORAL_TABLET | Freq: Once | ORAL | Status: AC | PRN

## 2022-12-03 MED ORDER — FENTANYL CITRATE (PF) 250 MCG/5ML IJ SOLN
INTRAMUSCULAR | Status: AC
  Filled 2022-12-03: qty 5

## 2022-12-03 MED ORDER — DEXAMETHASONE SODIUM PHOSPHATE 10 MG/ML IJ SOLN
INTRAMUSCULAR | Status: AC
  Filled 2022-12-03: qty 1

## 2022-12-03 MED ORDER — PHENYLEPHRINE HCL-NACL 20-0.9 MG/250ML-% IV SOLN
INTRAVENOUS | Status: DC | PRN
  Administered 2022-12-03: 35 ug/min via INTRAVENOUS

## 2022-12-03 MED ORDER — DIPHENHYDRAMINE HCL 50 MG/ML IJ SOLN
INTRAMUSCULAR | Status: AC
  Filled 2022-12-03: qty 1

## 2022-12-03 MED ORDER — OXYCODONE HCL 5 MG/5ML PO SOLN
5.0000 mg | Freq: Once | ORAL | Status: AC | PRN
  Administered 2022-12-03: 5 mg via ORAL

## 2022-12-03 SURGICAL SUPPLY — 47 items
APL PRP STRL LF DISP 70% ISPRP (MISCELLANEOUS) ×2
APL SKNCLS STERI-STRIP NONHPOA (GAUZE/BANDAGES/DRESSINGS) ×4
ARMBAND PINK RESTRICT EXTREMIT (MISCELLANEOUS) ×2 IMPLANT
BENZOIN TINCTURE PRP APPL 2/3 (GAUZE/BANDAGES/DRESSINGS) ×2 IMPLANT
BNDG ADH 1X3 SHEER STRL LF (GAUZE/BANDAGES/DRESSINGS) IMPLANT
BNDG ADH THN 3X1 STRL LF (GAUZE/BANDAGES/DRESSINGS) ×2
CANISTER SUCT 3000ML PPV (MISCELLANEOUS) ×2 IMPLANT
CANNULA VESSEL 3MM 2 BLNT TIP (CANNULA) ×2 IMPLANT
CHLORAPREP W/TINT 26 (MISCELLANEOUS) ×2 IMPLANT
CLIP LIGATING EXTRA MED SLVR (CLIP) ×2 IMPLANT
CLIP LIGATING EXTRA SM BLUE (MISCELLANEOUS) ×2 IMPLANT
DRSG COVADERM 4X8 (GAUZE/BANDAGES/DRESSINGS) IMPLANT
DRSG IV TEGADERM 3.5X4.5 STRL (GAUZE/BANDAGES/DRESSINGS) IMPLANT
ELECT REM PT RETURN 9FT ADLT (ELECTROSURGICAL) ×2
ELECTRODE REM PT RTRN 9FT ADLT (ELECTROSURGICAL) ×2 IMPLANT
GAUZE SPONGE 4X4 12PLY STRL (GAUZE/BANDAGES/DRESSINGS) IMPLANT
GLOVE BIO SURGEON STRL SZ8 (GLOVE) ×2 IMPLANT
GOWN STRL REUS W/ TWL LRG LVL3 (GOWN DISPOSABLE) ×4 IMPLANT
GOWN STRL REUS W/ TWL XL LVL3 (GOWN DISPOSABLE) ×2 IMPLANT
GOWN STRL REUS W/TWL LRG LVL3 (GOWN DISPOSABLE) ×4
GOWN STRL REUS W/TWL XL LVL3 (GOWN DISPOSABLE) ×2
GRAFT GORETEX STRT 4-7X45 (Vascular Products) IMPLANT
HEMOSTAT SNOW SURGICEL 2X4 (HEMOSTASIS) IMPLANT
INSERT FOGARTY SM (MISCELLANEOUS) IMPLANT
KIT BASIN OR (CUSTOM PROCEDURE TRAY) ×2 IMPLANT
KIT TURNOVER KIT B (KITS) ×2 IMPLANT
NDL 18GX1X1/2 (RX/OR ONLY) (NEEDLE) IMPLANT
NEEDLE 18GX1X1/2 (RX/OR ONLY) (NEEDLE) IMPLANT
NS IRRIG 1000ML POUR BTL (IV SOLUTION) ×2 IMPLANT
PACK CV ACCESS (CUSTOM PROCEDURE TRAY) ×2 IMPLANT
PAD ARMBOARD 7.5X6 YLW CONV (MISCELLANEOUS) ×4 IMPLANT
SHEATH PROBE COVER 6X72 (BAG) ×2 IMPLANT
SLING ARM FOAM STRAP LRG (SOFTGOODS) IMPLANT
SLING ARM FOAM STRAP MED (SOFTGOODS) IMPLANT
SPONGE T-LAP 18X18 ~~LOC~~+RFID (SPONGE) IMPLANT
STRIP CLOSURE SKIN 1/2X4 (GAUZE/BANDAGES/DRESSINGS) ×2 IMPLANT
SUT GORETEX 6.0 TT9 (SUTURE) ×2 IMPLANT
SUT MNCRL AB 4-0 PS2 18 (SUTURE) IMPLANT
SUT PROLENE 6 0 BV (SUTURE) IMPLANT
SUT SILK 2 0 SH (SUTURE) IMPLANT
SUT VIC AB 3-0 SH 27 (SUTURE) ×6
SUT VIC AB 3-0 SH 27X BRD (SUTURE) ×4 IMPLANT
SYR 3ML LL SCALE MARK (SYRINGE) IMPLANT
SYR TOOMEY 50ML (SYRINGE) IMPLANT
TOWEL GREEN STERILE (TOWEL DISPOSABLE) ×2 IMPLANT
UNDERPAD 30X36 HEAVY ABSORB (UNDERPADS AND DIAPERS) ×2 IMPLANT
WATER STERILE IRR 1000ML POUR (IV SOLUTION) ×2 IMPLANT

## 2022-12-03 NOTE — Transfer of Care (Signed)
Immediate Anesthesia Transfer of Care Note  Patient: [name]  Procedure(s) Performed: INSERTION OF LEFT ARM ARTERIOVENOUS (AV) GOR-TEX GRAFT (Left: Arm Upper) REMOVAL CONTINUOUS AMBULATORY PERITONEAL DIALYSIS CATHETER (Abdomen)  Patient Location: PACU  Anesthesia Type:General  Level of Consciousness: awake, alert , and oriented  Airway & Oxygen Therapy: Patient Spontanous Breathing and Patient connected to nasal cannula oxygen  Post-op Assessment: Report given to RN and Post -op Vital signs reviewed and stable  Post vital signs: Reviewed and stable  Last Vitals:  Vitals Value Taken Time  BP 122/50 12/03/22 1202  Temp    Pulse    Resp 17 12/03/22 1205  SpO2    Vitals shown include unfiled device data.  Last Pain:  Vitals:   12/03/22 0700  TempSrc:   PainSc: 0-No pain      Patients Stated Pain Goal: 0 (12/03/22 0700)  Complications: No notable events documented.

## 2022-12-03 NOTE — Anesthesia Postprocedure Evaluation (Signed)
Anesthesia Post Note  Patient: Samuel Burns  Procedure(s) Performed: INSERTION OF LEFT ARM ARTERIOVENOUS (AV) GOR-TEX GRAFT (Left: Arm Upper) REMOVAL CONTINUOUS AMBULATORY PERITONEAL DIALYSIS CATHETER (Abdomen)     Patient location during evaluation: PACU Anesthesia Type: General Level of consciousness: awake and alert Pain management: pain level controlled Vital Signs Assessment: post-procedure vital signs reviewed and stable Respiratory status: spontaneous breathing, nonlabored ventilation, respiratory function stable and patient connected to nasal cannula oxygen Cardiovascular status: blood pressure returned to baseline and stable Postop Assessment: no apparent nausea or vomiting Anesthetic complications: no   No notable events documented.  Last Vitals:  Vitals:   12/03/22 1245 12/03/22 1300  BP: 113/76 116/80  Pulse: 69 68  Resp: 20 12  Temp:  36.7 C  SpO2: 100% 99%    Last Pain:  Vitals:   12/03/22 1300  TempSrc:   PainSc: Asleep                 Bryar Dahms

## 2022-12-03 NOTE — Op Note (Addendum)
DATE OF SERVICE: 12/03/2022  PATIENT:  Samuel Burns  37 y.o. male  PRE-OPERATIVE DIAGNOSIS:  ESRD  POST-OPERATIVE DIAGNOSIS:  Same  PROCEDURE:   1) placement of left arm upper arm arteriovenous graft 2) removal of peritoneal dialysis catheter  SURGEON:  Surgeons and Role:    * [name], MD - Primary  ASSISTANT: Doreatha Massed, PA-C  An experienced assistant was required given the complexity of this procedure and the standard of surgical care. My assistant helped with exposure through counter tension, suctioning, ligation and retraction to better visualize the surgical field.  My assistant expedited sewing during the case by following my sutures. Wherever I use the term "we" in the report, my assistant actively helped me with that portion of the procedure.  ANESTHESIA:   general  EBL: minimal  BLOOD ADMINISTERED:none  DRAINS: none   LOCAL MEDICATIONS USED:  NONE  SPECIMEN:  none  COUNTS: confirmed correct.  TOURNIQUET:  none  PATIENT DISPOSITION:  PACU - hemodynamically stable.   Delay start of Pharmacological VTE agent (>24hrs) due to surgical blood loss or risk of bleeding: no  INDICATION FOR PROCEDURE: Samuel Burns is a 37 y.o. male with ESRD and non-functional PD catheter. After careful discussion of risks, benefits, and alternatives the patient was offered removal of PD catheter and placement of left arm graft. The patient understood and wished to proceed.  OPERATIVE FINDINGS: healthy left brachial artery and axillary vein. Good technical result from AVG. PD catheter removed.   DESCRIPTION OF PROCEDURE: After identification of the patient in the pre-operative holding area, the patient was transferred to the operating room. The patient was positioned supine on the operating room table. Anesthesia was induced. The left arm and abdomen were prepped and draped in standard fashion. A surgical pause was performed confirming correct patient, procedure, and operative  location.  The left brachial artery was exposed using a longitudinal incision in the distal arm just above the antecubital fossa.  Incision was carried down through subcutaneous tissue until the brachial sheath was encountered.  This was incised sharply.  The brachial artery was exposed and encircled with Silastic Vesseloops proximally and distally to the site of planned inflow.   The left axillary vein was exposed using longitudinal incision just below the hairbearing area of the axilla.  Incision was carried down until the brachial sheath was encountered.  The brachial vein was identified, exposed, encircled with Silastic Vesseloops.   Using a curved, sheathed tunneling device, a 4-7 mm tapered Gore-Tex graft was tunneled subcutaneously and gentle arc across the biceps of the left arm.   The brachial artery was clamped proximally and distally.  An anterior arteriotomy was made with an 11 blade.  This was extended with Potts scissors.  The 4 mm end of the Gore-Tex graft was spatulated and then anastomosed end-to-side to the brachial arteriotomy using continuous running suture of 6-0 Prolene.  The anastomosis was completed and hemostasis ensured.  The graft was clamped to restore perfusion to the hand.   The brachial vein was clamped proximally and distally.  The vein was transected. The peripheral vein was ligated with 2-O silk. The central vein was clamped with a Gregory clamp. The 7 mm end of the Gore-Tex graft was then anastomosed end to side to the brachial vein venotomy using continuous running suture of 6-0 Prolene.  Immediately prior to completion the anastomosis was de-aired and flushed.  Anastomosis was then completed.  Hemostasis was insured.   Doppler machine was brought onto  the field to interrogate the graft.  Doppler flow was noted in the radial artery.  About the arterial anastomosis flow was noted proximal and distal to the arterial anastomosis.  Distal to the venous anastomosis a  Doppler bruit was heard.  Satisfied we ended the case here.   Surgical beds were irrigated copiously.  Hemostasis was again ensured in the surgical beds.  The wounds were closed in layers using 3-0 Vicryl and 4-0 Monocryl.  Clean bandages were applied.  Transverse incisions were made over the abdomen corresponding to locations of felt cuffs for the peritoneal dialysis catheter.  The cuffs were removed from the surrounding subcutaneous soft tissue and the pigtail portion of the catheter delivered onto the field intact.  The catheter was then removed intact from the abdominal wall.  The fascial defect was closed with Vicryl.  The wounds were closed in layers using 3-0 Vicryl and 4-0 Monocryl.  Upon completion of the case instrument and sharps counts were confirmed correct. The patient was transferred to the PACU in good condition. I was present for all portions of the procedure.  FOLLOW UP PLAN: Assuming a normal postoperative course, we  will see the patient on an as need basis. He can cannulate the graft in 4 weeks.   [name], MD [facility] Phone Number: [phone] 12/03/2022 3:22 PM

## 2022-12-03 NOTE — Discharge Instructions (Signed)
[facility]  Discharge Instructions  AV Fistula or Graft Surgery for Dialysis Access  Please refer to the following instructions for your post-procedure care. Your surgeon or physician assistant will discuss any changes with you.  Activity  You may drive the day following your surgery, if you are comfortable and no longer taking prescription pain medication. Resume full activity as the soreness in your incision resolves.  Bathing/Showering  You may shower after you go home. Keep your incision dry for 48 hours. Do not soak in a bathtub, hot tub, or swim until the incision heals completely. You may not shower if you have a hemodialysis catheter.  Incision Care  Clean your incision with mild soap and water after 48 hours. Pat the area dry with a clean towel. You do not need a bandage unless otherwise instructed. Do not apply any ointments or creams to your incision. You may have skin glue on your incision. Do not peel it off. It will come off on its own in about one week. Your arm may swell a bit after surgery. To reduce swelling use pillows to elevate your arm so it is above your heart. Your doctor will tell you if you need to lightly wrap your arm with an ACE bandage.  Diet  Resume your normal diet. There are not special food restrictions following this procedure. In order to heal from your surgery, it is CRITICAL to get adequate nutrition. Your body requires vitamins, minerals, and protein. Vegetables are the best source of vitamins and minerals. Vegetables also provide the perfect balance of protein. Processed food has little nutritional value, so try to avoid this.  Medications  Resume taking all of your medications. If your incision is causing pain, you may take over-the counter pain relievers such as acetaminophen (Tylenol). If you were prescribed a stronger pain medication, please be aware these medications can cause nausea and constipation. Prevent  nausea by taking the medication with a snack or meal. Avoid constipation by drinking plenty of fluids and eating foods with high amount of fiber, such as fruits, vegetables, and grains.  Do not take Tylenol if you are taking prescription pain medications.  Follow up Your surgeon may want to see you in the office following your access surgery. If so, this will be arranged at the time of your surgery.  Please call us immediately for any of the following conditions:  Increased pain, redness, drainage (pus) from your incision site Fever of 101 degrees or higher Severe or worsening pain at your incision site Hand pain or numbness.  Reduce your risk of vascular disease:  Stop smoking. If you would like help, call [facility] at 1-800-QUIT-NOW (681-653-3489) or [place] at [phone]  Manage your cholesterol Maintain a desired weight Control your diabetes Keep your blood pressure down  Dialysis  It will take several weeks to several months for your new dialysis access to be ready for use. Your surgeon will determine when it is okay to use it. Your nephrologist will continue to direct your dialysis. You can continue to use your Permcath until your new access is ready for use.   12/03/2022 Samuel Burns [id number] [birth year]  Surgeon(s): [name], MD  Procedure(s): INSERTION OF LEFT ARM ARTERIOVENOUS (AV) GOR-TEX GRAFT REMOVAL CONTINUOUS AMBULATORY PERITONEAL DIALYSIS CATHETER  x Do not stick graft for 4 weeks    If you have any questions, please call the office at [phone].

## 2022-12-03 NOTE — Anesthesia Preprocedure Evaluation (Addendum)
Anesthesia Evaluation  Patient identified by MRN, date of birth, ID band Patient awake    Reviewed: Allergy & Precautions, NPO status , Patient's Chart, lab work & pertinent test results  History of Anesthesia Complications Negative for: history of anesthetic complications  Airway Mallampati: II  TM Distance: >3 FB Neck ROM: Full    Dental  (+) Teeth Intact, Dental Advisory Given   Pulmonary neg shortness of breath, asthma , neg sleep apnea, neg COPD, neg recent URI   breath sounds clear to auscultation       Cardiovascular hypertension, Pt. on medications and Pt. on home beta blockers (-) angina (-) Past MI and (-) CHF  Rhythm:Regular  1. Left ventricular ejection fraction, by estimation, is 50 to 55%. The  left ventricle has low normal function. The left ventricle has no regional  wall motion abnormalities. There is severe concentric left ventricular  hypertrophy. Left ventricular  diastolic parameters are consistent with Grade II diastolic dysfunction  (pseudonormalization). Elevated left ventricular end-diastolic pressure.   2. Right ventricular systolic function is normal. The right ventricular  size is normal. There is normal pulmonary artery systolic pressure.   3. Left atrial size was mildly dilated.   4. Moderate pericardial effusion. The pericardial effusion is  circumferential. There is no evidence of cardiac tamponade.   5. The mitral valve is normal in structure. Trivial mitral valve  regurgitation. No evidence of mitral stenosis.   6. The aortic valve is tricuspid. Aortic valve regurgitation is not  visualized. No aortic stenosis is present.   7. The inferior vena cava is normal in size with greater than 50%  respiratory variability, suggesting right atrial pressure of 3 mmHg.     Neuro/Psych negative neurological ROS     GI/Hepatic negative GI ROS, Neg liver ROS,,,  Endo/Other  negative endocrine ROS     Renal/GU ESRF and DialysisRenal diseaseLab Results      Component                Value               Date                      NA                       139                 12/03/2022                K                        4.7                 12/03/2022                CO2                      26                  08/26/2022                GLUCOSE                  83                  12/03/2022  BUN                      42 (H)              12/03/2022                CREATININE               10.30 (H)           12/03/2022                CALCIUM                  9.7                 08/26/2022                GFRNONAA                 8 (L)               08/26/2022                Musculoskeletal   Abdominal   Peds  Hematology  (+) Blood dyscrasia, anemia Lab Results      Component                Value               Date                      WBC                      12.5 (H)            08/26/2022                HGB                      12.6 (L)            12/03/2022                HCT                      37.0 (L)            12/03/2022                MCV                      96.3                08/26/2022                PLT                      295                 08/26/2022              Anesthesia Other Findings   Reproductive/Obstetrics                             Anesthesia Physical Anesthesia Plan  ASA: 3  Anesthesia Plan: General   Post-op Pain Management: Ofirmev IV (intra-op)*   Induction: Intravenous  PONV Risk Score and Plan: 2 and Ondansetron and Dexamethasone  Airway  Management Planned: Oral ETT  Additional Equipment: None  Intra-op Plan:   Post-operative Plan: Extubation in OR  Informed Consent: I have reviewed the patients History and Physical, chart, labs and discussed the procedure including the risks, benefits and alternatives for the proposed anesthesia with the patient or authorized representative who has indicated  his/her understanding and acceptance.     Dental advisory given  Plan Discussed with: CRNA  Anesthesia Plan Comments:         Anesthesia Quick Evaluation

## 2022-12-03 NOTE — Anesthesia Procedure Notes (Signed)
Procedure Name: Intubation Date/Time: 12/03/2022 7:49 AM  Performed by: Marena Chancy, CRNAPre-anesthesia Checklist: Patient identified, Emergency Drugs available, Suction available and Patient being monitored Patient Re-evaluated:Patient Re-evaluated prior to induction Oxygen Delivery Method: Circle System Utilized Preoxygenation: Pre-oxygenation with 100% oxygen Induction Type: IV induction Ventilation: Mask ventilation without difficulty and Oral airway inserted - appropriate to patient size Laryngoscope Size: Hyacinth Meeker and 2 Grade View: Grade I Tube type: Oral Tube size: 7.5 mm Number of attempts: 1 Airway Equipment and Method: Stylet and Oral airway Placement Confirmation: ETT inserted through vocal cords under direct vision, positive ETCO2 and breath sounds checked- equal and bilateral Tube secured with: Tape Dental Injury: Teeth and Oropharynx as per pre-operative assessment

## 2022-12-03 NOTE — Interval H&P Note (Signed)
History and Physical Interval Note:  12/03/2022 9:04 AM  Samuel Burns  has presented today for surgery, with the diagnosis of ESRD.  The various methods of treatment have been discussed with the patient and family. After consideration of risks, benefits and other options for treatment, the patient has consented to  Procedure(s): INSERTION OF LEFT ARM ARTERIOVENOUS (AV) GOR-TEX GRAFT (Left) LAPAROSCOPIC REMOVAL CONTINUOUS AMBULATORY PERITONEAL DIALYSIS  (CAPD) CATHETER (N/A) as a surgical intervention.  The patient's history has been reviewed, patient examined, no change in status, stable for surgery.  I have reviewed the patient's chart and labs.  Questions were answered to the patient's satisfaction.     [name]

## 2022-12-04 ENCOUNTER — Encounter (HOSPITAL_COMMUNITY): Payer: Self-pay | Admitting: Vascular Surgery

## 2022-12-04 DIAGNOSIS — Z4800 Encounter for change or removal of nonsurgical wound dressing: Secondary | ICD-10-CM | POA: Diagnosis not present

## 2022-12-04 DIAGNOSIS — D509 Iron deficiency anemia, unspecified: Secondary | ICD-10-CM | POA: Diagnosis not present

## 2022-12-04 DIAGNOSIS — N2581 Secondary hyperparathyroidism of renal origin: Secondary | ICD-10-CM | POA: Diagnosis not present

## 2022-12-04 DIAGNOSIS — Z992 Dependence on renal dialysis: Secondary | ICD-10-CM | POA: Diagnosis not present

## 2022-12-04 DIAGNOSIS — D689 Coagulation defect, unspecified: Secondary | ICD-10-CM | POA: Diagnosis not present

## 2022-12-04 DIAGNOSIS — N186 End stage renal disease: Secondary | ICD-10-CM | POA: Diagnosis not present

## 2022-12-04 NOTE — Plan of Care (Signed)
CHL Tonsillectomy/Adenoidectomy, Postoperative PEDS care plan entered in error.

## 2022-12-07 DIAGNOSIS — Z4800 Encounter for change or removal of nonsurgical wound dressing: Secondary | ICD-10-CM | POA: Diagnosis not present

## 2022-12-07 DIAGNOSIS — D689 Coagulation defect, unspecified: Secondary | ICD-10-CM | POA: Diagnosis not present

## 2022-12-07 DIAGNOSIS — N186 End stage renal disease: Secondary | ICD-10-CM | POA: Diagnosis not present

## 2022-12-07 DIAGNOSIS — D509 Iron deficiency anemia, unspecified: Secondary | ICD-10-CM | POA: Diagnosis not present

## 2022-12-07 DIAGNOSIS — N2581 Secondary hyperparathyroidism of renal origin: Secondary | ICD-10-CM | POA: Diagnosis not present

## 2022-12-07 DIAGNOSIS — Z992 Dependence on renal dialysis: Secondary | ICD-10-CM | POA: Diagnosis not present

## 2022-12-09 DIAGNOSIS — N2581 Secondary hyperparathyroidism of renal origin: Secondary | ICD-10-CM | POA: Diagnosis not present

## 2022-12-09 DIAGNOSIS — Z992 Dependence on renal dialysis: Secondary | ICD-10-CM | POA: Diagnosis not present

## 2022-12-09 DIAGNOSIS — N186 End stage renal disease: Secondary | ICD-10-CM | POA: Diagnosis not present

## 2022-12-09 DIAGNOSIS — D509 Iron deficiency anemia, unspecified: Secondary | ICD-10-CM | POA: Diagnosis not present

## 2022-12-09 DIAGNOSIS — Z4800 Encounter for change or removal of nonsurgical wound dressing: Secondary | ICD-10-CM | POA: Diagnosis not present

## 2022-12-09 DIAGNOSIS — D689 Coagulation defect, unspecified: Secondary | ICD-10-CM | POA: Diagnosis not present

## 2022-12-10 ENCOUNTER — Encounter: Payer: Self-pay | Admitting: Vascular Surgery

## 2022-12-12 DIAGNOSIS — N2581 Secondary hyperparathyroidism of renal origin: Secondary | ICD-10-CM | POA: Diagnosis not present

## 2022-12-12 DIAGNOSIS — N186 End stage renal disease: Secondary | ICD-10-CM | POA: Diagnosis not present

## 2022-12-12 DIAGNOSIS — D509 Iron deficiency anemia, unspecified: Secondary | ICD-10-CM | POA: Diagnosis not present

## 2022-12-12 DIAGNOSIS — D689 Coagulation defect, unspecified: Secondary | ICD-10-CM | POA: Diagnosis not present

## 2022-12-12 DIAGNOSIS — Z992 Dependence on renal dialysis: Secondary | ICD-10-CM | POA: Diagnosis not present

## 2022-12-12 DIAGNOSIS — Z4800 Encounter for change or removal of nonsurgical wound dressing: Secondary | ICD-10-CM | POA: Diagnosis not present

## 2022-12-14 DIAGNOSIS — D689 Coagulation defect, unspecified: Secondary | ICD-10-CM | POA: Diagnosis not present

## 2022-12-14 DIAGNOSIS — N186 End stage renal disease: Secondary | ICD-10-CM | POA: Diagnosis not present

## 2022-12-14 DIAGNOSIS — N2581 Secondary hyperparathyroidism of renal origin: Secondary | ICD-10-CM | POA: Diagnosis not present

## 2022-12-14 DIAGNOSIS — Z992 Dependence on renal dialysis: Secondary | ICD-10-CM | POA: Diagnosis not present

## 2022-12-14 DIAGNOSIS — Z4800 Encounter for change or removal of nonsurgical wound dressing: Secondary | ICD-10-CM | POA: Diagnosis not present

## 2022-12-14 DIAGNOSIS — D509 Iron deficiency anemia, unspecified: Secondary | ICD-10-CM | POA: Diagnosis not present

## 2022-12-17 DIAGNOSIS — N186 End stage renal disease: Secondary | ICD-10-CM | POA: Diagnosis not present

## 2022-12-17 DIAGNOSIS — I129 Hypertensive chronic kidney disease with stage 1 through stage 4 chronic kidney disease, or unspecified chronic kidney disease: Secondary | ICD-10-CM | POA: Diagnosis not present

## 2022-12-17 DIAGNOSIS — Z992 Dependence on renal dialysis: Secondary | ICD-10-CM | POA: Diagnosis not present

## 2022-12-18 DIAGNOSIS — N186 End stage renal disease: Secondary | ICD-10-CM | POA: Diagnosis not present

## 2022-12-18 DIAGNOSIS — D509 Iron deficiency anemia, unspecified: Secondary | ICD-10-CM | POA: Diagnosis not present

## 2022-12-18 DIAGNOSIS — N2581 Secondary hyperparathyroidism of renal origin: Secondary | ICD-10-CM | POA: Diagnosis not present

## 2022-12-18 DIAGNOSIS — D689 Coagulation defect, unspecified: Secondary | ICD-10-CM | POA: Diagnosis not present

## 2022-12-18 DIAGNOSIS — D631 Anemia in chronic kidney disease: Secondary | ICD-10-CM | POA: Diagnosis not present

## 2022-12-18 DIAGNOSIS — Z992 Dependence on renal dialysis: Secondary | ICD-10-CM | POA: Diagnosis not present

## 2022-12-18 DIAGNOSIS — Z4800 Encounter for change or removal of nonsurgical wound dressing: Secondary | ICD-10-CM | POA: Diagnosis not present

## 2022-12-18 DIAGNOSIS — Z23 Encounter for immunization: Secondary | ICD-10-CM | POA: Diagnosis not present

## 2022-12-19 DIAGNOSIS — Z4800 Encounter for change or removal of nonsurgical wound dressing: Secondary | ICD-10-CM | POA: Diagnosis not present

## 2022-12-19 DIAGNOSIS — D631 Anemia in chronic kidney disease: Secondary | ICD-10-CM | POA: Diagnosis not present

## 2022-12-19 DIAGNOSIS — D689 Coagulation defect, unspecified: Secondary | ICD-10-CM | POA: Diagnosis not present

## 2022-12-19 DIAGNOSIS — N2581 Secondary hyperparathyroidism of renal origin: Secondary | ICD-10-CM | POA: Diagnosis not present

## 2022-12-19 DIAGNOSIS — Z992 Dependence on renal dialysis: Secondary | ICD-10-CM | POA: Diagnosis not present

## 2022-12-19 DIAGNOSIS — D509 Iron deficiency anemia, unspecified: Secondary | ICD-10-CM | POA: Diagnosis not present

## 2022-12-19 DIAGNOSIS — N186 End stage renal disease: Secondary | ICD-10-CM | POA: Diagnosis not present

## 2022-12-19 DIAGNOSIS — Z23 Encounter for immunization: Secondary | ICD-10-CM | POA: Diagnosis not present

## 2022-12-21 DIAGNOSIS — D509 Iron deficiency anemia, unspecified: Secondary | ICD-10-CM | POA: Diagnosis not present

## 2022-12-21 DIAGNOSIS — Z23 Encounter for immunization: Secondary | ICD-10-CM | POA: Diagnosis not present

## 2022-12-21 DIAGNOSIS — D689 Coagulation defect, unspecified: Secondary | ICD-10-CM | POA: Diagnosis not present

## 2022-12-21 DIAGNOSIS — Z992 Dependence on renal dialysis: Secondary | ICD-10-CM | POA: Diagnosis not present

## 2022-12-21 DIAGNOSIS — D631 Anemia in chronic kidney disease: Secondary | ICD-10-CM | POA: Diagnosis not present

## 2022-12-21 DIAGNOSIS — N186 End stage renal disease: Secondary | ICD-10-CM | POA: Diagnosis not present

## 2022-12-21 DIAGNOSIS — Z4800 Encounter for change or removal of nonsurgical wound dressing: Secondary | ICD-10-CM | POA: Diagnosis not present

## 2022-12-21 DIAGNOSIS — N2581 Secondary hyperparathyroidism of renal origin: Secondary | ICD-10-CM | POA: Diagnosis not present

## 2022-12-22 ENCOUNTER — Other Ambulatory Visit: Payer: Self-pay

## 2022-12-22 ENCOUNTER — Other Ambulatory Visit: Payer: Self-pay | Admitting: Internal Medicine

## 2022-12-23 ENCOUNTER — Other Ambulatory Visit: Payer: Self-pay

## 2022-12-23 DIAGNOSIS — Z992 Dependence on renal dialysis: Secondary | ICD-10-CM | POA: Diagnosis not present

## 2022-12-23 DIAGNOSIS — D631 Anemia in chronic kidney disease: Secondary | ICD-10-CM | POA: Diagnosis not present

## 2022-12-23 DIAGNOSIS — Z4800 Encounter for change or removal of nonsurgical wound dressing: Secondary | ICD-10-CM | POA: Diagnosis not present

## 2022-12-23 DIAGNOSIS — Z23 Encounter for immunization: Secondary | ICD-10-CM | POA: Diagnosis not present

## 2022-12-23 DIAGNOSIS — N2581 Secondary hyperparathyroidism of renal origin: Secondary | ICD-10-CM | POA: Diagnosis not present

## 2022-12-23 DIAGNOSIS — D509 Iron deficiency anemia, unspecified: Secondary | ICD-10-CM | POA: Diagnosis not present

## 2022-12-23 DIAGNOSIS — N186 End stage renal disease: Secondary | ICD-10-CM | POA: Diagnosis not present

## 2022-12-23 DIAGNOSIS — D689 Coagulation defect, unspecified: Secondary | ICD-10-CM | POA: Diagnosis not present

## 2022-12-23 MED ORDER — HYDROXYZINE PAMOATE 25 MG PO CAPS
25.0000 mg | ORAL_CAPSULE | Freq: Two times a day (BID) | ORAL | 0 refills | Status: DC
  Filled 2022-12-23: qty 60, 30d supply, fill #0

## 2022-12-24 ENCOUNTER — Other Ambulatory Visit: Payer: Self-pay

## 2022-12-25 DIAGNOSIS — Z992 Dependence on renal dialysis: Secondary | ICD-10-CM | POA: Diagnosis not present

## 2022-12-25 DIAGNOSIS — Z4800 Encounter for change or removal of nonsurgical wound dressing: Secondary | ICD-10-CM | POA: Diagnosis not present

## 2022-12-25 DIAGNOSIS — Z23 Encounter for immunization: Secondary | ICD-10-CM | POA: Diagnosis not present

## 2022-12-25 DIAGNOSIS — D689 Coagulation defect, unspecified: Secondary | ICD-10-CM | POA: Diagnosis not present

## 2022-12-25 DIAGNOSIS — D631 Anemia in chronic kidney disease: Secondary | ICD-10-CM | POA: Diagnosis not present

## 2022-12-25 DIAGNOSIS — N2581 Secondary hyperparathyroidism of renal origin: Secondary | ICD-10-CM | POA: Diagnosis not present

## 2022-12-25 DIAGNOSIS — N186 End stage renal disease: Secondary | ICD-10-CM | POA: Diagnosis not present

## 2022-12-25 DIAGNOSIS — D509 Iron deficiency anemia, unspecified: Secondary | ICD-10-CM | POA: Diagnosis not present

## 2022-12-26 DIAGNOSIS — Z992 Dependence on renal dialysis: Secondary | ICD-10-CM | POA: Diagnosis not present

## 2022-12-26 DIAGNOSIS — N186 End stage renal disease: Secondary | ICD-10-CM | POA: Diagnosis not present

## 2022-12-26 DIAGNOSIS — Z23 Encounter for immunization: Secondary | ICD-10-CM | POA: Diagnosis not present

## 2022-12-26 DIAGNOSIS — D509 Iron deficiency anemia, unspecified: Secondary | ICD-10-CM | POA: Diagnosis not present

## 2022-12-26 DIAGNOSIS — E8770 Fluid overload, unspecified: Secondary | ICD-10-CM | POA: Diagnosis not present

## 2022-12-26 DIAGNOSIS — N2581 Secondary hyperparathyroidism of renal origin: Secondary | ICD-10-CM | POA: Diagnosis not present

## 2022-12-26 DIAGNOSIS — D631 Anemia in chronic kidney disease: Secondary | ICD-10-CM | POA: Diagnosis not present

## 2022-12-26 DIAGNOSIS — Z4800 Encounter for change or removal of nonsurgical wound dressing: Secondary | ICD-10-CM | POA: Diagnosis not present

## 2022-12-26 DIAGNOSIS — D689 Coagulation defect, unspecified: Secondary | ICD-10-CM | POA: Diagnosis not present

## 2022-12-28 DIAGNOSIS — Z4800 Encounter for change or removal of nonsurgical wound dressing: Secondary | ICD-10-CM | POA: Diagnosis not present

## 2022-12-28 DIAGNOSIS — Z992 Dependence on renal dialysis: Secondary | ICD-10-CM | POA: Diagnosis not present

## 2022-12-28 DIAGNOSIS — D689 Coagulation defect, unspecified: Secondary | ICD-10-CM | POA: Diagnosis not present

## 2022-12-28 DIAGNOSIS — D509 Iron deficiency anemia, unspecified: Secondary | ICD-10-CM | POA: Diagnosis not present

## 2022-12-28 DIAGNOSIS — D631 Anemia in chronic kidney disease: Secondary | ICD-10-CM | POA: Diagnosis not present

## 2022-12-28 DIAGNOSIS — N2581 Secondary hyperparathyroidism of renal origin: Secondary | ICD-10-CM | POA: Diagnosis not present

## 2022-12-28 DIAGNOSIS — N186 End stage renal disease: Secondary | ICD-10-CM | POA: Diagnosis not present

## 2022-12-28 DIAGNOSIS — Z23 Encounter for immunization: Secondary | ICD-10-CM | POA: Diagnosis not present

## 2022-12-30 DIAGNOSIS — D689 Coagulation defect, unspecified: Secondary | ICD-10-CM | POA: Diagnosis not present

## 2022-12-30 DIAGNOSIS — Z992 Dependence on renal dialysis: Secondary | ICD-10-CM | POA: Diagnosis not present

## 2022-12-30 DIAGNOSIS — Z4800 Encounter for change or removal of nonsurgical wound dressing: Secondary | ICD-10-CM | POA: Diagnosis not present

## 2022-12-30 DIAGNOSIS — N2581 Secondary hyperparathyroidism of renal origin: Secondary | ICD-10-CM | POA: Diagnosis not present

## 2022-12-30 DIAGNOSIS — D509 Iron deficiency anemia, unspecified: Secondary | ICD-10-CM | POA: Diagnosis not present

## 2022-12-30 DIAGNOSIS — D631 Anemia in chronic kidney disease: Secondary | ICD-10-CM | POA: Diagnosis not present

## 2022-12-30 DIAGNOSIS — N186 End stage renal disease: Secondary | ICD-10-CM | POA: Diagnosis not present

## 2022-12-30 DIAGNOSIS — Z23 Encounter for immunization: Secondary | ICD-10-CM | POA: Diagnosis not present

## 2023-01-01 DIAGNOSIS — N186 End stage renal disease: Secondary | ICD-10-CM | POA: Diagnosis not present

## 2023-01-01 DIAGNOSIS — Z4800 Encounter for change or removal of nonsurgical wound dressing: Secondary | ICD-10-CM | POA: Diagnosis not present

## 2023-01-01 DIAGNOSIS — Z23 Encounter for immunization: Secondary | ICD-10-CM | POA: Diagnosis not present

## 2023-01-01 DIAGNOSIS — Z992 Dependence on renal dialysis: Secondary | ICD-10-CM | POA: Diagnosis not present

## 2023-01-01 DIAGNOSIS — D631 Anemia in chronic kidney disease: Secondary | ICD-10-CM | POA: Diagnosis not present

## 2023-01-01 DIAGNOSIS — D509 Iron deficiency anemia, unspecified: Secondary | ICD-10-CM | POA: Diagnosis not present

## 2023-01-01 DIAGNOSIS — D689 Coagulation defect, unspecified: Secondary | ICD-10-CM | POA: Diagnosis not present

## 2023-01-01 DIAGNOSIS — N2581 Secondary hyperparathyroidism of renal origin: Secondary | ICD-10-CM | POA: Diagnosis not present

## 2023-01-04 DIAGNOSIS — D631 Anemia in chronic kidney disease: Secondary | ICD-10-CM | POA: Diagnosis not present

## 2023-01-04 DIAGNOSIS — N2581 Secondary hyperparathyroidism of renal origin: Secondary | ICD-10-CM | POA: Diagnosis not present

## 2023-01-04 DIAGNOSIS — D509 Iron deficiency anemia, unspecified: Secondary | ICD-10-CM | POA: Diagnosis not present

## 2023-01-04 DIAGNOSIS — Z992 Dependence on renal dialysis: Secondary | ICD-10-CM | POA: Diagnosis not present

## 2023-01-04 DIAGNOSIS — Z23 Encounter for immunization: Secondary | ICD-10-CM | POA: Diagnosis not present

## 2023-01-04 DIAGNOSIS — D689 Coagulation defect, unspecified: Secondary | ICD-10-CM | POA: Diagnosis not present

## 2023-01-04 DIAGNOSIS — N186 End stage renal disease: Secondary | ICD-10-CM | POA: Diagnosis not present

## 2023-01-04 DIAGNOSIS — Z4800 Encounter for change or removal of nonsurgical wound dressing: Secondary | ICD-10-CM | POA: Diagnosis not present

## 2023-01-05 ENCOUNTER — Ambulatory Visit: Payer: 59 | Attending: Internal Medicine | Admitting: Internal Medicine

## 2023-01-05 VITALS — BP 93/57 | HR 72 | Temp 98.5°F | Ht 68.0 in | Wt 191.0 lb

## 2023-01-05 DIAGNOSIS — N186 End stage renal disease: Secondary | ICD-10-CM | POA: Diagnosis not present

## 2023-01-05 DIAGNOSIS — R9089 Other abnormal findings on diagnostic imaging of central nervous system: Secondary | ICD-10-CM

## 2023-01-05 DIAGNOSIS — I129 Hypertensive chronic kidney disease with stage 1 through stage 4 chronic kidney disease, or unspecified chronic kidney disease: Secondary | ICD-10-CM | POA: Diagnosis not present

## 2023-01-05 DIAGNOSIS — F411 Generalized anxiety disorder: Secondary | ICD-10-CM | POA: Diagnosis not present

## 2023-01-05 DIAGNOSIS — Z992 Dependence on renal dialysis: Secondary | ICD-10-CM | POA: Diagnosis not present

## 2023-01-05 DIAGNOSIS — F330 Major depressive disorder, recurrent, mild: Secondary | ICD-10-CM

## 2023-01-05 NOTE — Progress Notes (Signed)
Patient ID: Samuel Burns, male    DOB: Apr 19, 1985  MRN: [id number]  CC: Hypertension (HTN f/u. Alver Sorrow informed patient that veins on L are are "clotted"/Already received flu vax.)   Subjective: Samuel Burns is a 37 y.o. male who presents for chronic ds management. His concerns today include:  ESRD on HD, hypertensive renal disease, anemia of chronic disease, ED   ESRD/HTN:  still on HD 3x/wk. Had AV graft placed L upper arm 1 mth ago.   BP low today; states always low even in HD.  Checks BP daily and SBP always in 90s.  No dizziness.  Told by Dr. Ronalee Belts about 1 mth ago to stop one med at bedtime; thinks it was Clonidine but he has continued to take it. On Norvasc 10 mg daily, Clonidine 0.1 mg BID, Losartan 50 mg, Metoprolol 75 mg BID  MDD:  referred to [facility] on last visit. Saw them once so far. Kept on Zoloft 100 mg daily and Hydroxyzine.  Feels he is doing well on meds even though GAD7 score is increased today from previous.  On visit 08/2022 I referred him to neurology for the abnormal MRI head findings:  Hosp last mth with involuntary movements.  Workup included MRI of the brain and the cervical spine.  MRI of the brain showed some nonspecific hyperintensities with differential to include chronic microvascular ischemia versus demyelinating process versus vasculitis.  Patient had reported using CBD Gummies. Advised to stop using them and he has done so. He reports no further involuntary movements.  Pt no showed the appt 11/26/22 with neurology. States he forgot. Patient Active Problem List   Diagnosis Date Noted   Involuntary movements 08/03/2022   Myoclonus 08/02/2022   Essential hypertension 08/02/2022   Numbness and tingling in left hand 02/26/2022   Mild major depression (HCC) 04/22/2021   Erectile dysfunction 07/30/2020   Pericardial effusion 12/15/2019   Hypertensive renal disease 12/15/2019   Thrombotic microangiopathy (HCC)    Anemia of chronic disease    ESRD  (end stage renal disease) on dialysis [facility])      Current Outpatient Medications on File Prior to Visit  Medication Sig Dispense Refill   acetaminophen (TYLENOL) 500 MG tablet Take 1,000 mg by mouth every 6 (six) hours as needed for moderate pain.     amLODipine (NORVASC) 10 MG tablet Take 1 tablet (10 mg total) by mouth at bedtime. 90 tablet 1   B Complex-C-Zn-Folic Acid (DIALYVITE 800-ZINC 15) 0.8 MG TABS Take 1 tablet by mouth daily. 100 tablet 1   calcitRIOL (ROCALTROL) 0.5 MCG capsule Take 0.5 mcg by mouth every Monday, Wednesday, and Friday.     cloNIDine (CATAPRES) 0.1 MG tablet Take 1 tablet (0.1 mg total) by mouth 2 (two) times daily. 60 tablet 4   fluticasone (FLONASE) 50 MCG/ACT nasal spray Place 2 sprays into both nostrils daily. (Patient taking differently: Place 2 sprays into both nostrils daily as needed for allergies.) 16 g 0   gentamicin cream (GARAMYCIN) 0.1 % Apply 1 Application topically daily.     hydrOXYzine (VISTARIL) 25 MG capsule Take 1 capsule (25 mg total) by mouth in the morning and at bedtime. 60 capsule 0   losartan (COZAAR) 50 MG tablet Take 1 tablet (50 mg total) by mouth in the morning. 30 tablet 4   Methoxy PEG-Epoetin Beta (MIRCERA IJ) Mircera     Metoprolol Tartrate 75 MG TABS TAKE 1 TABLET BY MOUTH TWICE DAILY 180 tablet 0   Naphazoline HCl (  CLEAR EYES OP) Place 1 drop into both eyes daily.     oxyCODONE-acetaminophen (PERCOCET) 5-325 MG tablet Take 1 tablet by mouth every 6 (six) hours as needed. 20 tablet 0   sertraline (ZOLOFT) 100 MG tablet Take 1 tablet (100 mg total) by mouth in the morning. 30 tablet 6   sildenafil (VIAGRA) 50 MG tablet 1 tab PO 1/2 hr prior to sex PRN.  Limit use to 1 tab/24 hr 10 tablet 2   sucroferric oxyhydroxide (VELPHORO) 500 MG chewable tablet Chew 500-1,000 mg by mouth See admin instructions. Take 2 tablets (1000 mg) by mouth 3 times daily with meals & take 1 tablet (500 mg) by mouth with snacks.     No current  facility-administered medications on file prior to visit.    No Known Allergies  Social History   Socioeconomic History   Marital status: Single    Spouse name: 1   Number of children: Not on file   Years of education: Not on file   Highest education level: GED or equivalent  Occupational History   Not on file  Tobacco Use   Smoking status: Never    Passive exposure: Never   Smokeless tobacco: Never  Vaping Use   Vaping status: Former   Quit date: 01/01/2022  Substance and Sexual Activity   Alcohol use: No   Drug use: Not Currently   Sexual activity: Yes    Birth control/protection: Condom  Other Topics Concern   Not on file  Social History Narrative   Not on file   Social Determinants of Health   Financial Resource Strain: Low Risk  (01/05/2023)   Overall Financial Resource Strain (CARDIA)    Difficulty of Paying Living Expenses: Not hard at all  Food Insecurity: No Food Insecurity (01/05/2023)   Hunger Vital Sign    Worried About Running Out of Food in the Last Year: Never true    Ran Out of Food in the Last Year: Never true  Transportation Needs: No Transportation Needs (01/05/2023)   PRAPARE - Administrator, Civil Service (Medical): No    Lack of Transportation (Non-Medical): No  Physical Activity: Inactive (01/05/2023)   Exercise Vital Sign    Days of Exercise per Week: 0 days    Minutes of Exercise per Session: 0 min  Stress: No Stress Concern Present (01/05/2023)   [employer] of Occupational Health - Occupational Stress Questionnaire    Feeling of Stress : Not at all  Social Connections: Socially Isolated (01/05/2023)   Social Connection and Isolation Panel [NHANES]    Frequency of Communication with Friends and Family: More than three times a week    Frequency of Social Gatherings with Friends and Family: Once a week    Attends Religious Services: Never    Database administrator or Organizations: No    Attends Banker  Meetings: Never    Marital Status: Never married  Intimate Partner Violence: Not At Risk (01/05/2023)   Humiliation, Afraid, Rape, and Kick questionnaire    Fear of Current or Ex-Partner: No    Emotionally Abused: No    Physically Abused: No    Sexually Abused: No    Family History  Problem Relation Age of Onset   Hypertension Maternal Grandmother    Kidney disease Father     Past Surgical History:  Procedure Laterality Date   AV FISTULA PLACEMENT Right 11/24/2019   Procedure: Creation of RIGHT ARM Brachial/Cephalic ARTERIOVENOUS (AV) FISTULA.;  Surgeon:  [name], MD;  Location: [facility] OR;  Service: Vascular;  Laterality: Right;   AV FISTULA PLACEMENT Left 12/22/2021   Procedure: LEFT ARM BRACHIOCEPHALIC ARTERIOVENOUS (AV) FISTULA CREATION;  Surgeon: [name], MD;  Location: [facility] OR;  Service: Vascular;  Laterality: Left;   AV FISTULA PLACEMENT Left 12/03/2022   Procedure: INSERTION OF LEFT ARM ARTERIOVENOUS (AV) GOR-TEX GRAFT;  Surgeon: [name], MD;  Location: [facility] OR;  Service: Vascular;  Laterality: Left;   BASCILIC VEIN TRANSPOSITION Left 01/27/2022   Procedure: LEFT FIRST STAGE BASILIC VEIN TRANSPOSITION;  Surgeon: [name], MD;  Location: [facility] OR;  Service: Vascular;  Laterality: Left;  PERIPHERAL NERVE BLOCK   CAPD INSERTION N/A 09/10/2022   Procedure: LAPAROSCOPIC INSERTION CONTINUOUS AMBULATORY PERITONEAL DIALYSIS  (CAPD) CATHETER;  Surgeon: [name], MD;  Location: [facility] OR;  Service: Vascular;  Laterality: N/A;   CAPD REMOVAL N/A 12/03/2022   Procedure: REMOVAL CONTINUOUS AMBULATORY PERITONEAL DIALYSIS CATHETER;  Surgeon: [name], MD;  Location: [facility] OR;  Service: Vascular;  Laterality: N/A;   IR AV DIALY SHUNT INTRO NEEDLE/INTRACATH INITIAL W/PTA/IMG RIGHT Right 02/11/2021   IR DIALY SHUNT INTRO NEEDLE/INTRACATH INITIAL W/IMG RIGHT Right 04/30/2020   IR FLUORO GUIDE CV LINE LEFT  11/21/2021   IR FLUORO GUIDE CV LINE RIGHT   11/20/2019   IR REMOVAL TUN CV CATH W/O FL  06/04/2020   IR US GUIDE VASC ACCESS LEFT  11/21/2021   IR US GUIDE VASC ACCESS RIGHT  11/20/2019   IR US GUIDE VASC ACCESS RIGHT  04/30/2020   IR US GUIDE VASC ACCESS RIGHT  02/11/2021    ROS: Review of Systems Negative except as stated above  PHYSICAL EXAM: BP (!) 93/57 (BP Location: Left Arm, Patient Position: Sitting, Cuff Size: Normal)   Pulse 72   Temp 98.5 F (36.9 C) (Oral)   Ht 5\' 8"  (1.727 m)   Wt 191 lb (86.6 kg)   SpO2 98%   BMI 29.04 kg/m   Physical Exam  General appearance - alert, well appearing middle age AAM, and in no distress Mental status - normal mood, behavior, speech, dress, motor activity, and thought processes Chest - clear to auscultation, no wheezes, rales or rhonchi, symmetric air entry Heart - normal rate, regular rhythm, normal S1, S2, no murmurs, rubs, clicks or gallops Extremities - peripheral pulses normal, no pedal edema, no clubbing or cyanosis decreased.  AV graft in LU arm.  Cath in LT subclavian    01/05/2023   10:44 AM 09/22/2022    8:38 AM 09/03/2022    3:02 PM  Depression screen PHQ 2/9  Decreased Interest 2 1 1   Down, Depressed, Hopeless 1 1 1   PHQ - 2 Score 3 2 2   Altered sleeping 0 1 1  Tired, decreased energy 1 1 1   Change in appetite 0 0 0  Feeling bad or failure about yourself  1 0 0  Trouble concentrating 1 2 2   Moving slowly or fidgety/restless 0 0 0  Suicidal thoughts 0 0 0  PHQ-9 Score 6 6 6   Difficult doing work/chores Not difficult at all        01/05/2023   10:44 AM 09/03/2022    3:03 PM 02/26/2022   11:00 AM 12/09/2021    2:41 PM  GAD 7 : Generalized Anxiety Score  Nervous, Anxious, on Edge 2 2 2 2   Control/stop worrying 1 1 2 1   Worry too much - different things 2 1 3  1  Trouble relaxing 2 0 3 1  Restless 1 1 3  0  Easily annoyed or irritable 3 2 2 1   Afraid - awful might happen 1 0 0 0  Total GAD 7 Score 12 7 15 6   Anxiety Difficulty Somewhat difficult            Latest Ref Rng & Units 12/03/2022    7:17 AM 09/10/2022    9:19 AM 08/26/2022    7:18 PM  CMP  Glucose 70 - 99 mg/dL 83  75  79   BUN 6 - 20 mg/dL 42  45  19   Creatinine 0.61 - 1.24 mg/dL 74.25  9.56  3.87   Sodium 135 - 145 mmol/L 139  137  136   Potassium 3.5 - 5.1 mmol/L 4.7  4.5  4.6   Chloride 98 - 111 mmol/L 100  98  92   CO2 22 - 32 mmol/L   26   Calcium 8.9 - 10.3 mg/dL   9.7    Lipid Panel     Component Value Date/Time   CHOL 186 11/20/2019 0428   CHOL 157 04/08/2017 1520   TRIG 207 (H) 11/20/2019 0428   HDL 30 (L) 11/20/2019 0428   HDL 39 (L) 04/08/2017 1520   CHOLHDL 6.2 11/20/2019 0428   VLDL 41 (H) 11/20/2019 0428   LDLCALC 115 (H) 11/20/2019 0428   LDLCALC 95 04/08/2017 1520    CBC    Component Value Date/Time   WBC 12.5 (H) 08/26/2022 1918   RBC 3.75 (L) 08/26/2022 1918   HGB 12.6 (L) 12/03/2022 0717   HGB 10.4 (L) 12/15/2019 1546   HCT 37.0 (L) 12/03/2022 0717   HCT 32.3 (L) 12/15/2019 1546   PLT 295 08/26/2022 1918   PLT 249 12/15/2019 1546   MCV 96.3 08/26/2022 1918   MCV 92 12/15/2019 1546   MCH 30.9 08/26/2022 1918   MCHC 32.1 08/26/2022 1918   RDW 16.1 (H) 08/26/2022 1918   RDW 13.0 12/15/2019 1546   LYMPHSABS 1.9 08/02/2022 0521   MONOABS 0.8 08/02/2022 0521   EOSABS 0.3 08/02/2022 0521   BASOSABS 0.1 08/02/2022 0521    ASSESSMENT AND PLAN: 1. Hypertensive renal disease Blood pressure is running on the low side.  I recommend weaning off clonidine.  Currently on 0.1 mg twice a day.  Advised to decrease to once a day.  Continue to monitor blood pressure daily.  If after 2 weeks systolic blood pressure is still staying around 100, he can stop the clonidine completely.  Continue Cozaar 50 mg daily, amlodipine 10 mg daily and metoprolol  2. ESRD on dialysis [facility]) He continues to go to dialysis Monday Wednesday and Fridays.  3. Abnormal brain MRI He is agreeable to me resubmitting referral to neurology for follow-up on this. -  Ambulatory referral to Neurology  4. GAD (generalized anxiety disorder) 5. Major depressive disorder, recurrent episode, mild (HCC) He has been plugged in with behavioral health.  He will continue Zoloft and hydroxyzine.    Patient was given the opportunity to ask questions.  Patient verbalized understanding of the plan and was able to repeat key elements of the plan.   This documentation was completed using Paediatric nurse.  Any transcriptional errors are unintentional.  Orders Placed This Encounter  Procedures   Ambulatory referral to Neurology     Requested Prescriptions    No prescriptions requested or ordered in this encounter    Return in about 4 months (around 05/05/2023).  Jonah Blue, MD, FACP

## 2023-01-05 NOTE — Patient Instructions (Signed)
Clonidine 0.1 mg once a day.  Continue to monitor your blood pressure daily.  If your top number remains below 100, after 2 weeks you can stop Clonidine.  I have resubmitted the referral for you to see the neurologist.  Please keep the appointment.

## 2023-01-06 DIAGNOSIS — Z992 Dependence on renal dialysis: Secondary | ICD-10-CM | POA: Diagnosis not present

## 2023-01-06 DIAGNOSIS — D689 Coagulation defect, unspecified: Secondary | ICD-10-CM | POA: Diagnosis not present

## 2023-01-06 DIAGNOSIS — D631 Anemia in chronic kidney disease: Secondary | ICD-10-CM | POA: Diagnosis not present

## 2023-01-06 DIAGNOSIS — Z4800 Encounter for change or removal of nonsurgical wound dressing: Secondary | ICD-10-CM | POA: Diagnosis not present

## 2023-01-06 DIAGNOSIS — D509 Iron deficiency anemia, unspecified: Secondary | ICD-10-CM | POA: Diagnosis not present

## 2023-01-06 DIAGNOSIS — N186 End stage renal disease: Secondary | ICD-10-CM | POA: Diagnosis not present

## 2023-01-06 DIAGNOSIS — N2581 Secondary hyperparathyroidism of renal origin: Secondary | ICD-10-CM | POA: Diagnosis not present

## 2023-01-06 DIAGNOSIS — Z23 Encounter for immunization: Secondary | ICD-10-CM | POA: Diagnosis not present

## 2023-01-07 ENCOUNTER — Ambulatory Visit (INDEPENDENT_AMBULATORY_CARE_PROVIDER_SITE_OTHER): Payer: 59

## 2023-01-07 VITALS — BP 88/50 | HR 75 | Temp 98.7°F | Ht 68.0 in | Wt 188.8 lb

## 2023-01-07 DIAGNOSIS — Z4800 Encounter for change or removal of nonsurgical wound dressing: Secondary | ICD-10-CM | POA: Diagnosis not present

## 2023-01-07 DIAGNOSIS — Z992 Dependence on renal dialysis: Secondary | ICD-10-CM

## 2023-01-07 DIAGNOSIS — Z23 Encounter for immunization: Secondary | ICD-10-CM | POA: Diagnosis not present

## 2023-01-07 DIAGNOSIS — D509 Iron deficiency anemia, unspecified: Secondary | ICD-10-CM | POA: Diagnosis not present

## 2023-01-07 DIAGNOSIS — D689 Coagulation defect, unspecified: Secondary | ICD-10-CM | POA: Diagnosis not present

## 2023-01-07 DIAGNOSIS — D631 Anemia in chronic kidney disease: Secondary | ICD-10-CM | POA: Diagnosis not present

## 2023-01-07 DIAGNOSIS — N186 End stage renal disease: Secondary | ICD-10-CM

## 2023-01-07 DIAGNOSIS — N2581 Secondary hyperparathyroidism of renal origin: Secondary | ICD-10-CM | POA: Diagnosis not present

## 2023-01-07 NOTE — Progress Notes (Signed)
Office Note     CC:  follow up Requesting Provider:  Marcine Matar, MD  HPI: Samuel Burns is a 37 y.o. (1985/03/19) male who presents for evaluation of left arm AV graft which was recently placed by Dr. Lenell Antu on 12/03/2022.  At the same time he removed his PD catheter which was no longer required.  He was told by his dialysis center that there was no flow through the new left arm AV graft.  He continues to dialyze via left IJ TDC.  Surgical history significant for right arm fistula which was functional for about 6 to 9 months.  He then had a failed left brachiocephalic fistula followed by a failed left first stage basilic vein fistula.  He dialyzes on a Monday Wednesday Friday schedule.   Past Medical History:  Diagnosis Date   ADHD (attention deficit hyperactivity disorder)    ADHD   Anemia    Anxiety    Asthma    as a child no current problems as adult   Depression    Hypertension    Renal failure    Dialysis M/W/F    Past Surgical History:  Procedure Laterality Date   AV FISTULA PLACEMENT Right 11/24/2019   Procedure: Creation of RIGHT ARM Brachial/Cephalic ARTERIOVENOUS (AV) FISTULA.;  Surgeon: [name], MD;  [facility];  Service: Vascular;  Laterality: Right;   AV FISTULA PLACEMENT Left 12/22/2021   Procedure: LEFT ARM BRACHIOCEPHALIC ARTERIOVENOUS (AV) FISTULA CREATION;  Surgeon: [name], MD;  [facility];  Service: Vascular;  Laterality: Left;   AV FISTULA PLACEMENT Left 12/03/2022   Procedure: INSERTION OF LEFT ARM ARTERIOVENOUS (AV) GOR-TEX GRAFT;  Surgeon: [name], MD;  [facility];  Service: Vascular;  Laterality: Left;   BASCILIC VEIN TRANSPOSITION Left 01/27/2022   Procedure: LEFT FIRST STAGE BASILIC VEIN TRANSPOSITION;  Surgeon: [name], MD;  [facility];  Service: Vascular;  Laterality: Left;  PERIPHERAL NERVE BLOCK   CAPD INSERTION N/A 09/10/2022   Procedure: LAPAROSCOPIC INSERTION CONTINUOUS  AMBULATORY PERITONEAL DIALYSIS  (CAPD) CATHETER;  Surgeon: [name], MD;  [facility];  Service: Vascular;  Laterality: N/A;   CAPD REMOVAL N/A 12/03/2022   Procedure: REMOVAL CONTINUOUS AMBULATORY PERITONEAL DIALYSIS CATHETER;  Surgeon: [name], MD;  [facility];  Service: Vascular;  Laterality: N/A;   IR AV DIALY SHUNT INTRO NEEDLE/INTRACATH INITIAL W/PTA/IMG RIGHT Right 02/11/2021   IR DIALY SHUNT INTRO NEEDLE/INTRACATH INITIAL W/IMG RIGHT Right 04/30/2020   IR FLUORO GUIDE CV LINE LEFT  11/21/2021   IR FLUORO GUIDE CV LINE RIGHT  11/20/2019   IR REMOVAL TUN CV CATH W/O FL  06/04/2020   IR US GUIDE VASC ACCESS LEFT  11/21/2021   IR US GUIDE VASC ACCESS RIGHT  11/20/2019   IR US GUIDE VASC ACCESS RIGHT  04/30/2020   IR US GUIDE VASC ACCESS RIGHT  02/11/2021    Social History   Socioeconomic History   Marital status: Single    Spouse name: 1   Number of children: Not on file   Years of education: Not on file   Highest education level: GED or equivalent  Occupational History   Not on file  Tobacco Use   Smoking status: Never    Passive exposure: Never   Smokeless tobacco: Never  Vaping Use   Vaping status: Former   Quit date: 01/01/2022  Substance and Sexual Activity   Alcohol use: No   Drug use: Not  Currently   Sexual activity: Yes    Birth control/protection: Condom  Other Topics Concern   Not on file  Social History Narrative   Not on file   Social Determinants of Health   Financial Resource Strain: Low Risk  (01/05/2023)   Overall Financial Resource Strain (CARDIA)    Difficulty of Paying Living Expenses: Not hard at all  Food Insecurity: No Food Insecurity (01/05/2023)   Hunger Vital Sign    Worried About Running Out of Food in the Last Year: Never true    Ran Out of Food in the Last Year: Never true  Transportation Needs: No Transportation Needs (01/05/2023)   PRAPARE - Administrator, Civil Service (Medical): No    Lack  of Transportation (Non-Medical): No  Physical Activity: Inactive (01/05/2023)   Exercise Vital Sign    Days of Exercise per Week: 0 days    Minutes of Exercise per Session: 0 min  Stress: No Stress Concern Present (01/05/2023)   [employer] of Occupational Health - Occupational Stress Questionnaire    Feeling of Stress : Not at all  Social Connections: Socially Isolated (01/05/2023)   Social Connection and Isolation Panel [NHANES]    Frequency of Communication with Friends and Family: More than three times a week    Frequency of Social Gatherings with Friends and Family: Once a week    Attends Religious Services: Never    Database administrator or Organizations: No    Attends Banker Meetings: Never    Marital Status: Never married  Intimate Partner Violence: Not At Risk (01/05/2023)   Humiliation, Afraid, Rape, and Kick questionnaire    Fear of Current or Ex-Partner: No    Emotionally Abused: No    Physically Abused: No    Sexually Abused: No    Family History  Problem Relation Age of Onset   Hypertension Maternal Grandmother    Kidney disease Father     Current Outpatient Medications  Medication Sig Dispense Refill   acetaminophen (TYLENOL) 500 MG tablet Take 1,000 mg by mouth every 6 (six) hours as needed for moderate pain.     amLODipine (NORVASC) 10 MG tablet Take 1 tablet (10 mg total) by mouth at bedtime. 90 tablet 1   B Complex-C-Zn-Folic Acid (DIALYVITE 800-ZINC 15) 0.8 MG TABS Take 1 tablet by mouth daily. 100 tablet 1   calcitRIOL (ROCALTROL) 0.5 MCG capsule Take 0.5 mcg by mouth every Monday, Wednesday, and Friday.     cloNIDine (CATAPRES) 0.1 MG tablet Take 1 tablet (0.1 mg total) by mouth 2 (two) times daily. 60 tablet 4   fluticasone (FLONASE) 50 MCG/ACT nasal spray Place 2 sprays into both nostrils daily. (Patient taking differently: Place 2 sprays into both nostrils daily as needed for allergies.) 16 g 0   gentamicin cream (GARAMYCIN) 0.1 %  Apply 1 Application topically daily.     hydrOXYzine (VISTARIL) 25 MG capsule Take 1 capsule (25 mg total) by mouth in the morning and at bedtime. 60 capsule 0   losartan (COZAAR) 50 MG tablet Take 1 tablet (50 mg total) by mouth in the morning. 30 tablet 4   Methoxy PEG-Epoetin Beta (MIRCERA IJ) Mircera     Metoprolol Tartrate 75 MG TABS TAKE 1 TABLET BY MOUTH TWICE DAILY 180 tablet 0   Naphazoline HCl (CLEAR EYES OP) Place 1 drop into both eyes daily.     oxyCODONE-acetaminophen (PERCOCET) 5-325 MG tablet Take 1 tablet by mouth every 6 (six) hours  as needed. 20 tablet 0   sertraline (ZOLOFT) 100 MG tablet Take 1 tablet (100 mg total) by mouth in the morning. 30 tablet 6   sildenafil (VIAGRA) 50 MG tablet 1 tab PO 1/2 hr prior to sex PRN.  Limit use to 1 tab/24 hr 10 tablet 2   sucroferric oxyhydroxide (VELPHORO) 500 MG chewable tablet Chew 500-1,000 mg by mouth See admin instructions. Take 2 tablets (1000 mg) by mouth 3 times daily with meals & take 1 tablet (500 mg) by mouth with snacks.     No current facility-administered medications for this visit.    No Known Allergies   REVIEW OF SYSTEMS:   [X]  denotes positive finding, [ ]  denotes negative finding Cardiac  Comments:  Chest pain or chest pressure:    Shortness of breath upon exertion:    Short of breath when lying flat:    Irregular heart rhythm:        Vascular    Pain in calf, thigh, or hip brought on by ambulation:    Pain in feet at night that wakes you up from your sleep:     Blood clot in your veins:    Leg swelling:         Pulmonary    Oxygen at home:    Productive cough:     Wheezing:         Neurologic    Sudden weakness in arms or legs:     Sudden numbness in arms or legs:     Sudden onset of difficulty speaking or slurred speech:    Temporary loss of vision in one eye:     Problems with dizziness:         Gastrointestinal    Blood in stool:     Vomited blood:         Genitourinary    Burning when  urinating:     Blood in urine:        Psychiatric    Major depression:         Hematologic    Bleeding problems:    Problems with blood clotting too easily:        Skin    Rashes or ulcers:        Constitutional    Fever or chills:      PHYSICAL EXAMINATION:  Vitals:   01/07/23 0911  BP: (!) 88/50  Pulse: 75  Temp: 98.7 F (37.1 C)  TempSrc: Temporal  SpO2: 98%  Weight: 188 lb 12.8 oz (85.6 kg)  Height: 5\' 8"  (1.727 m)    General:  WDWN in NAD; vital signs documented above Gait: Not observed HENT: WNL, normocephalic Pulmonary: normal non-labored breathing , without Rales, rhonchi,  wheezing Cardiac: regular HR Abdomen: soft, NT, no masses Skin: without rashes Vascular Exam/Pulses: Palpable radial pulses; no palpable flow through new left arm AV graft Extremities: without ischemic changes, without Gangrene , without cellulitis; without open wounds;  Musculoskeletal: no muscle wasting or atrophy  Neurologic: A&O X 3 Psychiatric:  The pt has Normal affect.   Non-Invasive Vascular Imaging:   none    ASSESSMENT/PLAN:: 37 y.o. male status post left upper arm AV graft placement  On exam I am unable to palpate or Doppler flow through the left arm AV graft which was placed last month.  He has history of dialysis access in both upper arms.  He continues to dialyze via left IJ TDC.  Given history of failed access on both arms  we will plan for bilateral upper extremity venogram on a nondialysis day in the near future.  Based on venogram we will then plan for new permanent access.  Case was discussed with the patient in detail and he is agreeable to proceed.  I recorded the patient's contact information and our scheduler will call to arrange the above procedure.   Emilie Rutter, PA-C [facility] [phone]  Clinic MD:   Karin Lieu

## 2023-01-08 DIAGNOSIS — D689 Coagulation defect, unspecified: Secondary | ICD-10-CM | POA: Diagnosis not present

## 2023-01-08 DIAGNOSIS — Z992 Dependence on renal dialysis: Secondary | ICD-10-CM | POA: Diagnosis not present

## 2023-01-08 DIAGNOSIS — N186 End stage renal disease: Secondary | ICD-10-CM | POA: Diagnosis not present

## 2023-01-08 DIAGNOSIS — Z23 Encounter for immunization: Secondary | ICD-10-CM | POA: Diagnosis not present

## 2023-01-08 DIAGNOSIS — D631 Anemia in chronic kidney disease: Secondary | ICD-10-CM | POA: Diagnosis not present

## 2023-01-08 DIAGNOSIS — Z4800 Encounter for change or removal of nonsurgical wound dressing: Secondary | ICD-10-CM | POA: Diagnosis not present

## 2023-01-08 DIAGNOSIS — D509 Iron deficiency anemia, unspecified: Secondary | ICD-10-CM | POA: Diagnosis not present

## 2023-01-08 DIAGNOSIS — N2581 Secondary hyperparathyroidism of renal origin: Secondary | ICD-10-CM | POA: Diagnosis not present

## 2023-01-10 DIAGNOSIS — Z4800 Encounter for change or removal of nonsurgical wound dressing: Secondary | ICD-10-CM | POA: Diagnosis not present

## 2023-01-10 DIAGNOSIS — N2581 Secondary hyperparathyroidism of renal origin: Secondary | ICD-10-CM | POA: Diagnosis not present

## 2023-01-10 DIAGNOSIS — D689 Coagulation defect, unspecified: Secondary | ICD-10-CM | POA: Diagnosis not present

## 2023-01-10 DIAGNOSIS — N186 End stage renal disease: Secondary | ICD-10-CM | POA: Diagnosis not present

## 2023-01-10 DIAGNOSIS — Z23 Encounter for immunization: Secondary | ICD-10-CM | POA: Diagnosis not present

## 2023-01-10 DIAGNOSIS — D631 Anemia in chronic kidney disease: Secondary | ICD-10-CM | POA: Diagnosis not present

## 2023-01-10 DIAGNOSIS — Z992 Dependence on renal dialysis: Secondary | ICD-10-CM | POA: Diagnosis not present

## 2023-01-10 DIAGNOSIS — D509 Iron deficiency anemia, unspecified: Secondary | ICD-10-CM | POA: Diagnosis not present

## 2023-01-11 ENCOUNTER — Telehealth: Payer: Self-pay

## 2023-01-11 NOTE — Telephone Encounter (Signed)
Attempted to reach pt to schedule his BUE Venogram. Left VM asking that he return our call.

## 2023-01-12 DIAGNOSIS — Z23 Encounter for immunization: Secondary | ICD-10-CM | POA: Diagnosis not present

## 2023-01-12 DIAGNOSIS — D631 Anemia in chronic kidney disease: Secondary | ICD-10-CM | POA: Diagnosis not present

## 2023-01-12 DIAGNOSIS — N2581 Secondary hyperparathyroidism of renal origin: Secondary | ICD-10-CM | POA: Diagnosis not present

## 2023-01-12 DIAGNOSIS — D689 Coagulation defect, unspecified: Secondary | ICD-10-CM | POA: Diagnosis not present

## 2023-01-12 DIAGNOSIS — Z992 Dependence on renal dialysis: Secondary | ICD-10-CM | POA: Diagnosis not present

## 2023-01-12 DIAGNOSIS — Z4800 Encounter for change or removal of nonsurgical wound dressing: Secondary | ICD-10-CM | POA: Diagnosis not present

## 2023-01-12 DIAGNOSIS — N186 End stage renal disease: Secondary | ICD-10-CM | POA: Diagnosis not present

## 2023-01-12 DIAGNOSIS — D509 Iron deficiency anemia, unspecified: Secondary | ICD-10-CM | POA: Diagnosis not present

## 2023-01-15 DIAGNOSIS — D689 Coagulation defect, unspecified: Secondary | ICD-10-CM | POA: Diagnosis not present

## 2023-01-15 DIAGNOSIS — Z23 Encounter for immunization: Secondary | ICD-10-CM | POA: Diagnosis not present

## 2023-01-15 DIAGNOSIS — Z992 Dependence on renal dialysis: Secondary | ICD-10-CM | POA: Diagnosis not present

## 2023-01-15 DIAGNOSIS — Z4800 Encounter for change or removal of nonsurgical wound dressing: Secondary | ICD-10-CM | POA: Diagnosis not present

## 2023-01-15 DIAGNOSIS — D509 Iron deficiency anemia, unspecified: Secondary | ICD-10-CM | POA: Diagnosis not present

## 2023-01-15 DIAGNOSIS — N186 End stage renal disease: Secondary | ICD-10-CM | POA: Diagnosis not present

## 2023-01-15 DIAGNOSIS — D631 Anemia in chronic kidney disease: Secondary | ICD-10-CM | POA: Diagnosis not present

## 2023-01-15 DIAGNOSIS — N2581 Secondary hyperparathyroidism of renal origin: Secondary | ICD-10-CM | POA: Diagnosis not present

## 2023-01-16 DIAGNOSIS — N186 End stage renal disease: Secondary | ICD-10-CM | POA: Diagnosis not present

## 2023-01-16 DIAGNOSIS — I129 Hypertensive chronic kidney disease with stage 1 through stage 4 chronic kidney disease, or unspecified chronic kidney disease: Secondary | ICD-10-CM | POA: Diagnosis not present

## 2023-01-16 DIAGNOSIS — Z992 Dependence on renal dialysis: Secondary | ICD-10-CM | POA: Diagnosis not present

## 2023-01-18 ENCOUNTER — Other Ambulatory Visit: Payer: Self-pay

## 2023-01-18 DIAGNOSIS — N186 End stage renal disease: Secondary | ICD-10-CM

## 2023-01-18 MED ORDER — SODIUM CHLORIDE 0.9% FLUSH: 3.0000 mL | INTRAVENOUS | Status: DC | PRN

## 2023-01-18 MED ORDER — SODIUM CHLORIDE 0.9% FLUSH: 3.0000 mL | Freq: Two times a day (BID) | INTRAVENOUS | Status: DC

## 2023-01-18 MED ORDER — SODIUM CHLORIDE 0.9 % IV SOLN: 250.0000 mL | INTRAVENOUS | Status: AC | PRN

## 2023-01-22 ENCOUNTER — Other Ambulatory Visit (HOSPITAL_COMMUNITY): Payer: Self-pay

## 2023-01-22 ENCOUNTER — Other Ambulatory Visit: Payer: Self-pay | Admitting: Internal Medicine

## 2023-01-22 MED ORDER — LOSARTAN POTASSIUM 50 MG PO TABS
50.0000 mg | ORAL_TABLET | Freq: Every morning | ORAL | 4 refills | Status: DC
Start: 2023-01-22 — End: 2023-09-24
  Filled 2023-01-22 – 2023-02-11 (×2): qty 30, 30d supply, fill #0
  Filled 2023-02-24 – 2023-04-27 (×2): qty 30, 30d supply, fill #1
  Filled 2023-06-07: qty 30, 30d supply, fill #2
  Filled 2023-07-14 – 2023-08-02 (×2): qty 30, 30d supply, fill #3
  Filled 2023-09-05: qty 30, 30d supply, fill #4

## 2023-01-23 ENCOUNTER — Other Ambulatory Visit (HOSPITAL_COMMUNITY): Payer: Self-pay

## 2023-01-25 ENCOUNTER — Other Ambulatory Visit (HOSPITAL_COMMUNITY): Payer: Self-pay

## 2023-02-01 ENCOUNTER — Other Ambulatory Visit: Payer: Self-pay

## 2023-02-01 ENCOUNTER — Other Ambulatory Visit (HOSPITAL_COMMUNITY): Payer: Self-pay

## 2023-02-04 ENCOUNTER — Encounter (HOSPITAL_COMMUNITY)
Admission: RE | Disposition: A | Discharge status: Home / Self Care | Payer: Self-pay | Source: Home / Self Care | Attending: Vascular Surgery

## 2023-02-04 ENCOUNTER — Ambulatory Visit (HOSPITAL_COMMUNITY)
Admission: RE | Admit: 2023-02-04 | Discharge: 2023-02-04 | Disposition: A | Discharge status: Home / Self Care | Payer: Medicare Other | Attending: Vascular Surgery | Admitting: Vascular Surgery

## 2023-02-04 ENCOUNTER — Other Ambulatory Visit: Payer: Self-pay

## 2023-02-04 DIAGNOSIS — N185 Chronic kidney disease, stage 5: Secondary | ICD-10-CM | POA: Diagnosis not present

## 2023-02-04 DIAGNOSIS — Z992 Dependence on renal dialysis: Secondary | ICD-10-CM | POA: Insufficient documentation

## 2023-02-04 DIAGNOSIS — I120 Hypertensive chronic kidney disease with stage 5 chronic kidney disease or end stage renal disease: Secondary | ICD-10-CM | POA: Insufficient documentation

## 2023-02-04 DIAGNOSIS — N186 End stage renal disease: Secondary | ICD-10-CM | POA: Insufficient documentation

## 2023-02-04 HISTORY — PX: UPPER EXTREMITY VENOGRAPHY: CATH118272

## 2023-02-04 LAB — POCT I-STAT, CHEM 8
BUN: 35 mg/dL — ABNORMAL HIGH (ref 6–20)
Calcium, Ion: 1.14 mmol/L — ABNORMAL LOW (ref 1.15–1.40)
Chloride: 99 mmol/L (ref 98–111)
Creatinine, Ser: 10.7 mg/dL — ABNORMAL HIGH (ref 0.61–1.24)
Glucose, Bld: 79 mg/dL (ref 70–99)
HCT: 41 % (ref 39.0–52.0)
Hemoglobin: 13.9 g/dL (ref 13.0–17.0)
Potassium: 4.1 mmol/L (ref 3.5–5.1)
Sodium: 140 mmol/L (ref 135–145)
TCO2: 30 mmol/L (ref 22–32)

## 2023-02-04 SURGERY — UPPER EXTREMITY VENOGRAPHY
Anesthesia: LOCAL | Laterality: Bilateral

## 2023-02-04 MED ORDER — IODIXANOL 320 MG/ML IV SOLN
INTRAVENOUS | Status: DC | PRN
  Administered 2023-02-04: 60 mL

## 2023-02-04 NOTE — H&P (Signed)
History and Physical Interval Note:  02/04/2023 9:41 AM  Samuel Burns  has presented today for surgery, with the diagnosis of instage renal.  The various methods of treatment have been discussed with the patient and family. After consideration of risks, benefits and other options for treatment, the patient has consented to  Procedure(s): UPPER EXTREMITY VENOGRAPHY (N/A) as a surgical intervention.  The patient's history has been reviewed, patient examined, no change in status, stable for surgery.  I have reviewed the patient's chart and labs.  Questions were answered to the patient's satisfaction.     [name]   Office Note        CC:  follow up Requesting Provider:  Marcine Matar, MD   HPI: Samuel Burns is a 37 y.o. (Feb 21, 1985) male who presents for evaluation of left arm AV graft which was recently placed by Dr. Lenell Antu on 12/03/2022.  At the same time he removed his PD catheter which was no longer required.  He was told by his dialysis center that there was no flow through the new left arm AV graft.  He continues to dialyze via left IJ TDC.  Surgical history significant for right arm fistula which was functional for about 6 to 9 months.  He then had a failed left brachiocephalic fistula followed by a failed left first stage basilic vein fistula.  He dialyzes on a Monday Wednesday Friday schedule.         Past Medical History:  Diagnosis Date   ADHD (attention deficit hyperactivity disorder)      ADHD   Anemia     Anxiety     Asthma      as a child no current problems as adult   Depression     Hypertension     Renal failure      Dialysis M/W/F               Past Surgical History:  Procedure Laterality Date   AV FISTULA PLACEMENT Right 11/24/2019    Procedure: Creation of RIGHT ARM Brachial/Cephalic ARTERIOVENOUS (AV) FISTULA.;  Surgeon: [name], MD;  [facility];  Service: Vascular;  Laterality: Right;   AV FISTULA PLACEMENT Left  12/22/2021    Procedure: LEFT ARM BRACHIOCEPHALIC ARTERIOVENOUS (AV) FISTULA CREATION;  Surgeon: [name], MD;  [facility];  Service: Vascular;  Laterality: Left;   AV FISTULA PLACEMENT Left 12/03/2022    Procedure: INSERTION OF LEFT ARM ARTERIOVENOUS (AV) GOR-TEX GRAFT;  Surgeon: [name], MD;  [facility];  Service: Vascular;  Laterality: Left;   BASCILIC VEIN TRANSPOSITION Left 01/27/2022    Procedure: LEFT FIRST STAGE BASILIC VEIN TRANSPOSITION;  Surgeon: [name], MD;  Location: [facility] OR;  Service: Vascular;  Laterality: Left;  PERIPHERAL NERVE BLOCK   CAPD INSERTION N/A 09/10/2022    Procedure: LAPAROSCOPIC INSERTION CONTINUOUS AMBULATORY PERITONEAL DIALYSIS  (CAPD) CATHETER;  Surgeon: [name], MD;  [facility];  Service: Vascular;  Laterality: N/A;   CAPD REMOVAL N/A 12/03/2022    Procedure: REMOVAL CONTINUOUS AMBULATORY PERITONEAL DIALYSIS CATHETER;  Surgeon: [name], MD;  [facility];  Service: Vascular;  Laterality: N/A;   IR AV DIALY SHUNT INTRO NEEDLE/INTRACATH INITIAL W/PTA/IMG RIGHT Right 02/11/2021   IR DIALY SHUNT INTRO NEEDLE/INTRACATH INITIAL W/IMG RIGHT Right 04/30/2020   IR FLUORO GUIDE CV LINE LEFT   11/21/2021   IR FLUORO GUIDE CV LINE RIGHT   11/20/2019   IR REMOVAL TUN CV  CATH W/O FL   06/04/2020   IR US GUIDE VASC ACCESS LEFT   11/21/2021   IR US GUIDE VASC ACCESS RIGHT   11/20/2019   IR US GUIDE VASC ACCESS RIGHT   04/30/2020   IR US GUIDE VASC ACCESS RIGHT   02/11/2021          Social History         Socioeconomic History   Marital status: Single      Spouse name: 1   Number of children: Not on file   Years of education: Not on file   Highest education level: GED or equivalent  Occupational History   Not on file  Tobacco Use   Smoking status: Never      Passive exposure: Never   Smokeless tobacco: Never  Vaping Use   Vaping status: Former   Quit date: 01/01/2022  Substance and Sexual  Activity   Alcohol use: No   Drug use: Not Currently   Sexual activity: Yes      Birth control/protection: Condom  Other Topics Concern   Not on file  Social History Narrative   Not on file    Social Determinants of Health        Financial Resource Strain: Low Risk  (01/05/2023)    Overall Financial Resource Strain (CARDIA)     Difficulty of Paying Living Expenses: Not hard at all  Food Insecurity: No Food Insecurity (01/05/2023)    Hunger Vital Sign     Worried About Running Out of Food in the Last Year: Never true     Ran Out of Food in the Last Year: Never true  Transportation Needs: No Transportation Needs (01/05/2023)    PRAPARE - Therapist, art (Medical): No     Lack of Transportation (Non-Medical): No  Physical Activity: Inactive (01/05/2023)    Exercise Vital Sign     Days of Exercise per Week: 0 days     Minutes of Exercise per Session: 0 min  Stress: No Stress Concern Present (01/05/2023)    [employer] of Occupational Health - Occupational Stress Questionnaire     Feeling of Stress : Not at all  Social Connections: Socially Isolated (01/05/2023)    Social Connection and Isolation Panel [NHANES]     Frequency of Communication with Friends and Family: More than three times a week     Frequency of Social Gatherings with Friends and Family: Once a week     Attends Religious Services: Never     Database administrator or Organizations: No     Attends Banker Meetings: Never     Marital Status: Never married  Intimate Partner Violence: Not At Risk (01/05/2023)    Humiliation, Afraid, Rape, and Kick questionnaire     Fear of Current or Ex-Partner: No     Emotionally Abused: No     Physically Abused: No     Sexually Abused: No           Family History  Problem Relation Age of Onset   Hypertension Maternal Grandmother     Kidney disease Father                  Current Outpatient Medications  Medication Sig  Dispense Refill   acetaminophen (TYLENOL) 500 MG tablet Take 1,000 mg by mouth every 6 (six) hours as needed for moderate pain.       amLODipine (NORVASC) 10 MG tablet Take 1 tablet (  10 mg total) by mouth at bedtime. 90 tablet 1   B Complex-C-Zn-Folic Acid (DIALYVITE 800-ZINC 15) 0.8 MG TABS Take 1 tablet by mouth daily. 100 tablet 1   calcitRIOL (ROCALTROL) 0.5 MCG capsule Take 0.5 mcg by mouth every Monday, Wednesday, and Friday.       cloNIDine (CATAPRES) 0.1 MG tablet Take 1 tablet (0.1 mg total) by mouth 2 (two) times daily. 60 tablet 4   fluticasone (FLONASE) 50 MCG/ACT nasal spray Place 2 sprays into both nostrils daily. (Patient taking differently: Place 2 sprays into both nostrils daily as needed for allergies.) 16 g 0   gentamicin cream (GARAMYCIN) 0.1 % Apply 1 Application topically daily.       hydrOXYzine (VISTARIL) 25 MG capsule Take 1 capsule (25 mg total) by mouth in the morning and at bedtime. 60 capsule 0   losartan (COZAAR) 50 MG tablet Take 1 tablet (50 mg total) by mouth in the morning. 30 tablet 4   Methoxy PEG-Epoetin Beta (MIRCERA IJ) Mircera       Metoprolol Tartrate 75 MG TABS TAKE 1 TABLET BY MOUTH TWICE DAILY 180 tablet 0   Naphazoline HCl (CLEAR EYES OP) Place 1 drop into both eyes daily.       oxyCODONE-acetaminophen (PERCOCET) 5-325 MG tablet Take 1 tablet by mouth every 6 (six) hours as needed. 20 tablet 0   sertraline (ZOLOFT) 100 MG tablet Take 1 tablet (100 mg total) by mouth in the morning. 30 tablet 6   sildenafil (VIAGRA) 50 MG tablet 1 tab PO 1/2 hr prior to sex PRN.  Limit use to 1 tab/24 hr 10 tablet 2   sucroferric oxyhydroxide (VELPHORO) 500 MG chewable tablet Chew 500-1,000 mg by mouth See admin instructions. Take 2 tablets (1000 mg) by mouth 3 times daily with meals & take 1 tablet (500 mg) by mouth with snacks.          No current facility-administered medications for this visit.        Allergies  No Known Allergies       REVIEW OF SYSTEMS:     [X]  denotes positive finding, [ ]  denotes negative finding Cardiac   Comments:  Chest pain or chest pressure:      Shortness of breath upon exertion:      Short of breath when lying flat:      Irregular heart rhythm:             Vascular      Pain in calf, thigh, or hip brought on by ambulation:      Pain in feet at night that wakes you up from your sleep:       Blood clot in your veins:      Leg swelling:              Pulmonary      Oxygen at home:      Productive cough:       Wheezing:              Neurologic      Sudden weakness in arms or legs:       Sudden numbness in arms or legs:       Sudden onset of difficulty speaking or slurred speech:      Temporary loss of vision in one eye:       Problems with dizziness:              Gastrointestinal      Blood  in stool:       Vomited blood:              Genitourinary      Burning when urinating:       Blood in urine:             Psychiatric      Major depression:              Hematologic      Bleeding problems:      Problems with blood clotting too easily:             Skin      Rashes or ulcers:             Constitutional      Fever or chills:          PHYSICAL EXAMINATION:      Vitals:    01/07/23 0911  BP: (!) 88/50  Pulse: 75  Temp: 98.7 F (37.1 C)  TempSrc: Temporal  SpO2: 98%  Weight: 188 lb 12.8 oz (85.6 kg)  Height: 5\' 8"  (1.727 m)      General:  WDWN in NAD; vital signs documented above Gait: Not observed HENT: WNL, normocephalic Pulmonary: normal non-labored breathing , without Rales, rhonchi,  wheezing Cardiac: regular HR Abdomen: soft, NT, no masses Skin: without rashes Vascular Exam/Pulses: Palpable radial pulses; no palpable flow through new left arm AV graft Extremities: without ischemic changes, without Gangrene , without cellulitis; without open wounds;  Musculoskeletal: no muscle wasting or atrophy       Neurologic: A&O X 3 Psychiatric:  The pt has Normal affect.      Non-Invasive Vascular Imaging:   none       ASSESSMENT/PLAN:: 37 y.o. male status post left upper arm AV graft placement   On exam I am unable to palpate or Doppler flow through the left arm AV graft which was placed last month.  He has history of dialysis access in both upper arms.  He continues to dialyze via left IJ TDC.  Given history of failed access on both arms we will plan for bilateral upper extremity venogram on a nondialysis day in the near future.  Based on venogram we will then plan for new permanent access.  Case was discussed with the patient in detail and he is agreeable to proceed.  I recorded the patient's contact information and our scheduler will call to arrange the above procedure.     Emilie Rutter, PA-C [facility] [phone]   Clinic MD:   Karin Lieu

## 2023-02-04 NOTE — Op Note (Addendum)
Date: February 04, 2023  Preoperative diagnosis: End-stage renal disease  Postoperative diagnosis: Same  Procedure: Bilateral upper extremity venogram  Surgeon: Dr. [name], MD  Assistant: Cath Lab staff  Indications: 37 year old male with end-stage renal disease that has failed bilateral upper extremity access.  Currently dialyzing via left IJ TDC.  Patient presents for fistulogram of bilateral upper extremities to plan next access.  Most recently failed a left upper arm AV graft.  Findings: Right upper extremity venogram shows one widely patent brachial vein with a patent axillary and subclavian and innominate vein.  Subclavian vein has some disease that does not appear flow-limiting.  Left upper extremity venogram shows widely patent brachial veins as well as axillary vein, subclavian vein, and innominate vein.  There is a left IJ TDC in place.  Both central veins appear to be open.  I discussed the option of doing a first-time right upper arm AV graft versus a redo left upper arm AV graft.  Patient has elected for right upper arm access.  We will schedule.  Anesthesia: None  Details: Patient was taken to the Cath Lab after he had bilateral forearm IVs placed in preoperative holding.  Placed on the Cath Lab table and a timeout was performed.  Initially used contrast to inject IV in his right forearm to evaluate the central veins.  Pertinent findings are noted above.  We then did the same injecting the IV in his left forearm with contrast and evaluating central veins.  Pertinent findings are noted above.  Patient remained stable during the case.  Complication: None  Condition: Stable   [name], MD [facility] [place] Office: [phone]   [name]

## 2023-02-05 ENCOUNTER — Other Ambulatory Visit: Payer: Self-pay

## 2023-02-05 ENCOUNTER — Other Ambulatory Visit: Payer: Self-pay | Admitting: Internal Medicine

## 2023-02-05 ENCOUNTER — Other Ambulatory Visit (HOSPITAL_COMMUNITY): Payer: Self-pay

## 2023-02-05 ENCOUNTER — Encounter (HOSPITAL_COMMUNITY): Payer: Self-pay | Admitting: Vascular Surgery

## 2023-02-05 MED ORDER — HYDROXYZINE PAMOATE 25 MG PO CAPS
25.0000 mg | ORAL_CAPSULE | Freq: Two times a day (BID) | ORAL | 0 refills | Status: DC
  Filled 2023-02-05 – 2023-02-24 (×2): qty 180, 90d supply, fill #0

## 2023-02-05 NOTE — Telephone Encounter (Signed)
Requested Prescriptions  Pending Prescriptions Disp Refills   hydrOXYzine (VISTARIL) 25 MG capsule 180 capsule 0    Sig: Take 1 capsule (25 mg total) by mouth in the morning and at bedtime.     Ear, Nose, and Throat:  Antihistamines 2 Failed - 02/05/2023 11:56 AM      Failed - Cr in normal range and within 360 days    Creatinine, Ser  Date Value Ref Range Status  02/04/2023 10.70 (H) 0.61 - 1.24 mg/dL Final         Passed - Valid encounter within last 12 months    Recent Outpatient Visits           1 month ago Hypertensive renal disease   [facility] [place], [facility], [facility] Marcine Matar, MD   5 months ago ESRD on dialysis [facility])   [facility] [place]. [facility] Marcine Matar, MD   11 months ago ESRD on dialysis [facility])   [facility] [place]. [facility] Storm Frisk, MD   1 year ago Encounter for annual physical exam   [facility] - [facility] Of [place]. [facility] Claiborne Rigg, NP   1 year ago Major depressive disorder, recurrent episode, mild (HCC)    [facility] [place]. [facility] Marcine Matar, MD

## 2023-02-07 ENCOUNTER — Encounter: Payer: Self-pay | Admitting: Internal Medicine

## 2023-02-08 ENCOUNTER — Other Ambulatory Visit: Payer: Self-pay

## 2023-02-09 ENCOUNTER — Other Ambulatory Visit: Payer: Self-pay

## 2023-02-09 ENCOUNTER — Telehealth: Payer: Self-pay

## 2023-02-09 NOTE — Telephone Encounter (Signed)
Attempted to reach patient to schedule RUA AVG placement. Left VM for patient to return call.

## 2023-02-11 ENCOUNTER — Other Ambulatory Visit: Payer: Self-pay

## 2023-02-11 ENCOUNTER — Other Ambulatory Visit (HOSPITAL_COMMUNITY): Payer: Self-pay

## 2023-02-24 ENCOUNTER — Other Ambulatory Visit (HOSPITAL_COMMUNITY): Payer: Self-pay

## 2023-02-24 ENCOUNTER — Other Ambulatory Visit: Payer: Self-pay | Admitting: Internal Medicine

## 2023-02-24 ENCOUNTER — Other Ambulatory Visit: Payer: Self-pay

## 2023-02-24 MED ORDER — CINACALCET HCL 60 MG PO TABS
60.0000 mg | ORAL_TABLET | Freq: Every day | ORAL | 1 refills | Status: DC
  Filled 2023-02-24: qty 90, 90d supply, fill #0
  Filled 2023-04-27: qty 30, 30d supply, fill #0
  Filled 2023-04-27 – 2023-09-05 (×7): qty 90, 90d supply, fill #0

## 2023-02-25 ENCOUNTER — Other Ambulatory Visit (HOSPITAL_COMMUNITY): Payer: Self-pay

## 2023-02-25 ENCOUNTER — Other Ambulatory Visit (HOSPITAL_BASED_OUTPATIENT_CLINIC_OR_DEPARTMENT_OTHER): Payer: Self-pay

## 2023-03-02 ENCOUNTER — Telehealth: Payer: Self-pay

## 2023-03-02 NOTE — Telephone Encounter (Signed)
 Attempted to reach pt to schedule his AVG. Left voicemail asking that he return our call.

## 2023-03-11 ENCOUNTER — Other Ambulatory Visit (HOSPITAL_COMMUNITY): Payer: Self-pay

## 2023-03-17 ENCOUNTER — Telehealth: Payer: Self-pay

## 2023-03-17 NOTE — Telephone Encounter (Signed)
Third attempt to reach pt to schedule his AV graft. Left VM for him to return our call. Will mail him letter that we have tried to reach him.

## 2023-03-26 ENCOUNTER — Other Ambulatory Visit: Payer: Self-pay

## 2023-03-26 DIAGNOSIS — N186 End stage renal disease: Secondary | ICD-10-CM

## 2023-04-01 ENCOUNTER — Encounter (HOSPITAL_COMMUNITY): Payer: Self-pay | Admitting: Vascular Surgery

## 2023-04-01 ENCOUNTER — Other Ambulatory Visit: Payer: Self-pay

## 2023-04-01 NOTE — Pre-Procedure Instructions (Signed)
-------------    SDW INSTRUCTIONS given:  Your procedure is scheduled on 2/17.  Report to [facility] Main Entrance "A" at 10:45 A.M., and check in at the Admitting office.  Any questions or running late day of surgery: call [phone]    Remember:  Do not eat or drink after midnight the night before your surgery     Take these medicines the morning of surgery with A SIP OF WATER  tylenol PRN, clonidine, flonase PRN, hydroxyzine, metoprolol, clear eyes PRN, percocet PRN, zoloft     As of today, STOP taking any Aspirin (unless otherwise instructed by your surgeon) Aleve, Naproxen, Ibuprofen, Motrin, Advil, Goody's, BC's, all herbal medications, fish oil, and all vitamins.   Do NOT Smoke (Tobacco/Vaping) 24 hours prior to your procedure  If you use a CPAP at night, you may bring all equipment for your overnight stay.     You will be asked to remove any contacts, glasses, piercing's, hearing aid's, dentures/partials prior to surgery. Please bring cases for these items if needed.     Patients discharged the day of surgery will not be allowed to drive home, and someone needs to stay with them for 24 hours.  SURGICAL WAITING ROOM VISITATION Patients may have no more than 2 support people in the waiting area - these visitors may rotate.   Pre-op nurse will coordinate an appropriate time for 1 ADULT support person, who may not rotate, to accompany patient in pre-op.  Children under the age of 11 must have an adult with them who is not the patient and must remain in the main waiting area with an adult.  If the patient needs to stay at the hospital during part of their recovery, the visitor guidelines for inpatient rooms apply.  Please refer to the [facility] website for the visitor guidelines for any additional information.   Special instructions:   [place]- Preparing For Surgery   Please follow these instructions carefully.   Shower the NIGHT BEFORE SURGERY and the MORNING OF  SURGERY with DIAL Soap.   Pat yourself dry with a CLEAN TOWEL.  Wear CLEAN PAJAMAS to bed the night before surgery  Place CLEAN SHEETS on your bed the night of your first shower and DO NOT SLEEP WITH PETS.   Additional instructions for the day of surgery: DO NOT APPLY any lotions, deodorants, cologne, or perfumes.   Do not wear jewelry or makeup Do not wear nail polish, gel polish, artificial nails, or any other type of covering on natural nails (fingers and toes) Do not bring valuables to the hospital. [facility] is not responsible for valuables/personal belongings. Put on clean/comfortable clothes.  Please brush your teeth.  Ask your nurse before applying any prescription medications to the skin.

## 2023-04-01 NOTE — Progress Notes (Signed)
PCP - Dr. Jonah Blue Cardiologist - denies  PPM/ICD - denies   Chest x-ray - 02/06/22 EKG - 08/02/22 Stress Test - denies ECHO - 08/27/20 Cardiac Cath - denies  CPAP - denies  DM- denies  ASA/Blood Thinner Instructions: n/a   ERAS Protcol - no, NPO  COVID TEST- n/a  Anesthesia review: no  Patient verbally denies any shortness of breath, fever, cough and chest pain during phone call      Questions were answered. Patient verbalized understanding of instructions.

## 2023-04-02 ENCOUNTER — Other Ambulatory Visit: Payer: Self-pay

## 2023-04-05 ENCOUNTER — Ambulatory Visit (HOSPITAL_COMMUNITY): Payer: 59 | Admitting: Anesthesiology

## 2023-04-05 ENCOUNTER — Encounter (HOSPITAL_COMMUNITY)
Admission: RE | Disposition: A | Discharge status: Home / Self Care | Payer: Self-pay | Source: Home / Self Care | Attending: Vascular Surgery

## 2023-04-05 ENCOUNTER — Encounter (HOSPITAL_COMMUNITY): Payer: Self-pay | Admitting: Vascular Surgery

## 2023-04-05 ENCOUNTER — Ambulatory Visit (HOSPITAL_COMMUNITY)
Admission: RE | Admit: 2023-04-05 | Discharge: 2023-04-05 | Disposition: A | Discharge status: Home / Self Care | Payer: 59 | Attending: Vascular Surgery | Admitting: Vascular Surgery

## 2023-04-05 ENCOUNTER — Other Ambulatory Visit: Payer: Self-pay

## 2023-04-05 DIAGNOSIS — I120 Hypertensive chronic kidney disease with stage 5 chronic kidney disease or end stage renal disease: Secondary | ICD-10-CM | POA: Insufficient documentation

## 2023-04-05 DIAGNOSIS — Z992 Dependence on renal dialysis: Secondary | ICD-10-CM | POA: Insufficient documentation

## 2023-04-05 DIAGNOSIS — N186 End stage renal disease: Secondary | ICD-10-CM

## 2023-04-05 DIAGNOSIS — Z8249 Family history of ischemic heart disease and other diseases of the circulatory system: Secondary | ICD-10-CM | POA: Insufficient documentation

## 2023-04-05 DIAGNOSIS — N185 Chronic kidney disease, stage 5: Secondary | ICD-10-CM

## 2023-04-05 DIAGNOSIS — Z79899 Other long term (current) drug therapy: Secondary | ICD-10-CM | POA: Insufficient documentation

## 2023-04-05 HISTORY — PX: AV FISTULA PLACEMENT: SHX1204

## 2023-04-05 LAB — POCT I-STAT, CHEM 8
BUN: 47 mg/dL — ABNORMAL HIGH (ref 6–20)
Calcium, Ion: 1.1 mmol/L — ABNORMAL LOW (ref 1.15–1.40)
Chloride: 101 mmol/L (ref 98–111)
Creatinine, Ser: 14.9 mg/dL — ABNORMAL HIGH (ref 0.61–1.24)
Glucose, Bld: 80 mg/dL (ref 70–99)
HCT: 35 % — ABNORMAL LOW (ref 39.0–52.0)
Hemoglobin: 11.9 g/dL — ABNORMAL LOW (ref 13.0–17.0)
Potassium: 4.5 mmol/L (ref 3.5–5.1)
Sodium: 138 mmol/L (ref 135–145)
TCO2: 30 mmol/L (ref 22–32)

## 2023-04-05 SURGERY — ARTERIOVENOUS (AV) FISTULA CREATION
Anesthesia: Monitor Anesthesia Care | Site: Arm Upper | Laterality: Right

## 2023-04-05 MED ORDER — OXYCODONE HCL 5 MG/5ML PO SOLN: 5.0000 mg | Freq: Once | ORAL | Status: DC | PRN

## 2023-04-05 MED ORDER — ORAL CARE MOUTH RINSE: 15.0000 mL | Freq: Once | OROMUCOSAL | Status: AC

## 2023-04-05 MED ORDER — FENTANYL CITRATE (PF) 100 MCG/2ML IJ SOLN: 50.0000 ug | Freq: Once | INTRAMUSCULAR | Status: AC

## 2023-04-05 MED ORDER — HEPARIN 6000 UNIT IRRIGATION SOLUTION
Status: DC | PRN
  Administered 2023-04-05: 1

## 2023-04-05 MED ORDER — DEXAMETHASONE SODIUM PHOSPHATE 10 MG/ML IJ SOLN
INTRAMUSCULAR | Status: DC | PRN
  Administered 2023-04-05: 10 mg via INTRAVENOUS

## 2023-04-05 MED ORDER — MEPERIDINE HCL 25 MG/ML IJ SOLN: 6.2500 mg | INTRAMUSCULAR | Status: DC | PRN

## 2023-04-05 MED ORDER — MIDAZOLAM HCL 2 MG/2ML IJ SOLN
INTRAMUSCULAR | Status: AC
  Filled 2023-04-05: qty 2

## 2023-04-05 MED ORDER — MIDAZOLAM HCL 2 MG/2ML IJ SOLN: 0.5000 mg | Freq: Once | INTRAMUSCULAR | Status: DC | PRN

## 2023-04-05 MED ORDER — ACETAMINOPHEN 500 MG PO TABS
1000.0000 mg | ORAL_TABLET | Freq: Once | ORAL | Status: AC
  Administered 2023-04-05: 1000 mg via ORAL
  Filled 2023-04-05: qty 2

## 2023-04-05 MED ORDER — LIDOCAINE-EPINEPHRINE (PF) 1 %-1:200000 IJ SOLN
INTRAMUSCULAR | Status: AC
  Filled 2023-04-05: qty 30

## 2023-04-05 MED ORDER — MIDAZOLAM HCL 2 MG/2ML IJ SOLN: 1.0000 mg | Freq: Once | INTRAMUSCULAR | Status: AC

## 2023-04-05 MED ORDER — CHLORHEXIDINE GLUCONATE 4 % EX SOLN: 60.0000 mL | Freq: Once | CUTANEOUS | Status: DC

## 2023-04-05 MED ORDER — OXYCODONE HCL 5 MG PO TABS: 5.0000 mg | ORAL_TABLET | Freq: Once | ORAL | Status: DC | PRN

## 2023-04-05 MED ORDER — FENTANYL CITRATE (PF) 100 MCG/2ML IJ SOLN: 25.0000 ug | INTRAMUSCULAR | Status: DC | PRN

## 2023-04-05 MED ORDER — OXYCODONE-ACETAMINOPHEN 5-325 MG PO TABS: 1.0000 | ORAL_TABLET | Freq: Four times a day (QID) | ORAL | 0 refills | Status: DC | PRN

## 2023-04-05 MED ORDER — FENTANYL CITRATE (PF) 250 MCG/5ML IJ SOLN
INTRAMUSCULAR | Status: AC
  Filled 2023-04-05: qty 5

## 2023-04-05 MED ORDER — ONDANSETRON HCL 4 MG/2ML IJ SOLN
INTRAMUSCULAR | Status: AC
  Filled 2023-04-05: qty 2

## 2023-04-05 MED ORDER — DEXAMETHASONE SODIUM PHOSPHATE 10 MG/ML IJ SOLN
INTRAMUSCULAR | Status: AC
  Filled 2023-04-05: qty 1

## 2023-04-05 MED ORDER — MIDAZOLAM HCL 2 MG/2ML IJ SOLN
INTRAMUSCULAR | Status: DC | PRN
  Administered 2023-04-05: 2 mg via INTRAVENOUS

## 2023-04-05 MED ORDER — SODIUM CHLORIDE 0.9% FLUSH: 3.0000 mL | INTRAVENOUS | Status: DC | PRN

## 2023-04-05 MED ORDER — ONDANSETRON HCL 4 MG/2ML IJ SOLN
INTRAMUSCULAR | Status: DC | PRN
  Administered 2023-04-05: 4 mg via INTRAVENOUS

## 2023-04-05 MED ORDER — PHENYLEPHRINE 80 MCG/ML (10ML) SYRINGE FOR IV PUSH (FOR BLOOD PRESSURE SUPPORT)
PREFILLED_SYRINGE | INTRAVENOUS | Status: DC | PRN
  Administered 2023-04-05: 160 ug via INTRAVENOUS

## 2023-04-05 MED ORDER — PROPOFOL 10 MG/ML IV BOLUS
INTRAVENOUS | Status: DC | PRN
  Administered 2023-04-05: 100 ug/kg/min via INTRAVENOUS
  Administered 2023-04-05: 20 mg via INTRAVENOUS
  Administered 2023-04-05 (×2): 30 mg via INTRAVENOUS

## 2023-04-05 MED ORDER — CLONIDINE HCL 0.2 MG PO TABS
0.1000 mg | ORAL_TABLET | Freq: Once | ORAL | Status: AC
  Administered 2023-04-05: 0.1 mg via ORAL
  Filled 2023-04-05: qty 1

## 2023-04-05 MED ORDER — CEFAZOLIN SODIUM-DEXTROSE 2-4 GM/100ML-% IV SOLN
2.0000 g | INTRAVENOUS | Status: AC
  Administered 2023-04-05: 2 g via INTRAVENOUS
  Filled 2023-04-05: qty 100

## 2023-04-05 MED ORDER — SODIUM CHLORIDE 0.9 % IV SOLN: INTRAVENOUS | Status: DC | PRN

## 2023-04-05 MED ORDER — 0.9 % SODIUM CHLORIDE (POUR BTL) OPTIME
TOPICAL | Status: DC | PRN
  Administered 2023-04-05: 1000 mL

## 2023-04-05 MED ORDER — FENTANYL CITRATE (PF) 100 MCG/2ML IJ SOLN
INTRAMUSCULAR | Status: AC
  Administered 2023-04-05: 50 ug via INTRAVENOUS
  Filled 2023-04-05: qty 2

## 2023-04-05 MED ORDER — METOPROLOL TARTRATE 50 MG PO TABS
75.0000 mg | ORAL_TABLET | Freq: Once | ORAL | Status: AC
  Administered 2023-04-05: 75 mg via ORAL
  Filled 2023-04-05: qty 2

## 2023-04-05 MED ORDER — MIDAZOLAM HCL 2 MG/2ML IJ SOLN
INTRAMUSCULAR | Status: AC
  Administered 2023-04-05: 1 mg via INTRAVENOUS
  Filled 2023-04-05: qty 2

## 2023-04-05 MED ORDER — LIDOCAINE-EPINEPHRINE (PF) 1.5 %-1:200000 IJ SOLN
INTRAMUSCULAR | Status: DC | PRN
  Administered 2023-04-05: 30 mL via PERINEURAL

## 2023-04-05 MED ORDER — FENTANYL CITRATE (PF) 250 MCG/5ML IJ SOLN
INTRAMUSCULAR | Status: DC | PRN
  Administered 2023-04-05: 25 ug via INTRAVENOUS
  Administered 2023-04-05 (×2): 50 ug via INTRAVENOUS

## 2023-04-05 MED ORDER — SODIUM CHLORIDE 0.9% FLUSH: 3.0000 mL | Freq: Two times a day (BID) | INTRAVENOUS | Status: DC

## 2023-04-05 MED ORDER — CHLORHEXIDINE GLUCONATE 0.12 % MT SOLN
15.0000 mL | Freq: Once | OROMUCOSAL | Status: AC
  Administered 2023-04-05: 15 mL via OROMUCOSAL
  Filled 2023-04-05: qty 15

## 2023-04-05 MED ORDER — PHENYLEPHRINE 80 MCG/ML (10ML) SYRINGE FOR IV PUSH (FOR BLOOD PRESSURE SUPPORT)
PREFILLED_SYRINGE | INTRAVENOUS | Status: AC
  Filled 2023-04-05: qty 10

## 2023-04-05 SURGICAL SUPPLY — 34 items
ARMBAND PINK RESTRICT EXTREMIT (MISCELLANEOUS) ×4 IMPLANT
BAG COUNTER SPONGE SURGICOUNT (BAG) ×2 IMPLANT
BLADE CLIPPER SURG (BLADE) ×2 IMPLANT
CANISTER SUCT 3000ML PPV (MISCELLANEOUS) ×2 IMPLANT
CLIP TI MEDIUM 6 (CLIP) ×2 IMPLANT
CLIP TI WIDE RED SMALL 6 (CLIP) ×2 IMPLANT
COVER PROBE W GEL 5X96 (DRAPES) ×2 IMPLANT
DERMABOND ADVANCED .7 DNX12 (GAUZE/BANDAGES/DRESSINGS) ×2 IMPLANT
DERMABOND ADVANCED .7 DNX6 (GAUZE/BANDAGES/DRESSINGS) ×1 IMPLANT
ELECT REM PT RETURN 9FT ADLT (ELECTROSURGICAL) ×2 IMPLANT
ELECTRODE REM PT RTRN 9FT ADLT (ELECTROSURGICAL) ×1 IMPLANT
GLOVE BIO SURGEON STRL SZ7.5 (GLOVE) ×2 IMPLANT
GLOVE BIOGEL PI IND STRL 8 (GLOVE) ×2 IMPLANT
GOWN STRL REUS W/ TWL LRG LVL3 (GOWN DISPOSABLE) ×4 IMPLANT
GOWN STRL REUS W/ TWL XL LVL3 (GOWN DISPOSABLE) ×4 IMPLANT
HEMOSTAT SPONGE AVITENE ULTRA (HEMOSTASIS) IMPLANT
KIT BASIN OR (CUSTOM PROCEDURE TRAY) ×2 IMPLANT
KIT TURNOVER KIT B (KITS) ×2 IMPLANT
LOOP VESSEL MINI RED (MISCELLANEOUS) ×1 IMPLANT
NS IRRIG 1000ML POUR BTL (IV SOLUTION) ×2 IMPLANT
PACK CV ACCESS (CUSTOM PROCEDURE TRAY) ×2 IMPLANT
PAD ARMBOARD 7.5X6 YLW CONV (MISCELLANEOUS) ×4 IMPLANT
SLING ARM FOAM STRAP LRG (SOFTGOODS) IMPLANT
SLING ARM FOAM STRAP MED (SOFTGOODS) IMPLANT
SPIKE FLUID TRANSFER (MISCELLANEOUS) ×2 IMPLANT
SUT GORETEX 6.0 TT13 (SUTURE) IMPLANT
SUT MNCRL AB 4-0 PS2 18 (SUTURE) ×2 IMPLANT
SUT PROLENE 6 0 BV (SUTURE) ×2 IMPLANT
SUT PROLENE 7 0 BV 1 (SUTURE) IMPLANT
SUT SILK 2 0 PERMA HAND 18 BK (SUTURE) IMPLANT
SUT VIC AB 3-0 SH 27X BRD (SUTURE) ×4 IMPLANT
TOWEL GREEN STERILE (TOWEL DISPOSABLE) ×2 IMPLANT
UNDERPAD 30X36 HEAVY ABSORB (UNDERPADS AND DIAPERS) ×2 IMPLANT
WATER STERILE IRR 1000ML POUR (IV SOLUTION) ×2 IMPLANT

## 2023-04-05 NOTE — Discharge Instructions (Signed)
 [facility]  Discharge Instructions  AV Fistula or Graft Surgery for Dialysis Access  Please refer to the following instructions for your post-procedure care. Your surgeon or physician assistant will discuss any changes with you.  Activity  You may drive the day following your surgery, if you are comfortable and no longer taking prescription pain medication. Resume full activity as the soreness in your incision resolves.  Bathing/Showering  You may shower after you go home. Keep your incision dry for 48 hours. Do not soak in a bathtub, hot tub, or swim until the incision heals completely. You may not shower if you have a hemodialysis catheter.  Incision Care  Clean your incision with mild soap and water after 48 hours. Pat the area dry with a clean towel. You do not need a bandage unless otherwise instructed. Do not apply any ointments or creams to your incision. You may have skin glue on your incision. Do not peel it off. It will come off on its own in about one week. Your arm may swell a bit after surgery. To reduce swelling use pillows to elevate your arm so it is above your heart. Your doctor will tell you if you need to lightly wrap your arm with an ACE bandage.  Diet  Resume your normal diet. There are not special food restrictions following this procedure. In order to heal from your surgery, it is CRITICAL to get adequate nutrition. Your body requires vitamins, minerals, and protein. Vegetables are the best source of vitamins and minerals. Vegetables also provide the perfect balance of protein. Processed food has little nutritional value, so try to avoid this.  Medications  Resume taking all of your medications. If your incision is causing pain, you may take over-the counter pain relievers such as acetaminophen (Tylenol). If you were prescribed a stronger pain medication, please be aware these medications can cause nausea and constipation. Prevent  nausea by taking the medication with a snack or meal. Avoid constipation by drinking plenty of fluids and eating foods with high amount of fiber, such as fruits, vegetables, and grains.  Do not take Tylenol if you are taking prescription pain medications.  Follow up Your surgeon may want to see you in the office following your access surgery. If so, this will be arranged at the time of your surgery.  Please call us immediately for any of the following conditions:  Increased pain, redness, drainage (pus) from your incision site Fever of 101 degrees or higher Severe or worsening pain at your incision site Hand pain or numbness.  Reduce your risk of vascular disease:  Stop smoking. If you would like help, call [facility] at 1-800-QUIT-NOW (587-341-7434) or [place] at 403-850-7876  Manage your cholesterol Maintain a desired weight Control your diabetes Keep your blood pressure down  Dialysis  It will take several weeks to several months for your new dialysis access to be ready for use. Your surgeon will determine when it is okay to use it. Your nephrologist will continue to direct your dialysis. You can continue to use your Permcath until your new access is ready for use.   04/05/2023 Samuel Burns [id number] Dec 14, 1985  Surgeon(s): [name], MD  Procedure(s): RIGHT FIRST STAGE BRACHIOBASILIC ARTERIOVENOUS (AV) FISTULA CREATION   May stick graft immediately   May stick graft on designated area only:   X Do not stick right AV fistula for 12 weeks    If you have any questions, please call the  office at [phone].

## 2023-04-05 NOTE — Anesthesia Procedure Notes (Signed)
 Anesthesia Regional Block: Interscalene brachial plexus block   Pre-Anesthetic Checklist: , timeout performed,  Correct Patient, Correct Site, Correct Laterality,  Correct Procedure, Correct Position, site marked,  Risks and benefits discussed,  Surgical consent,  Pre-op evaluation,  At surgeon's request and post-op pain management  Laterality: Right and Upper  Prep: chloraprep       Needles:  Injection technique: Single-shot  Needle Type: Echogenic Needle     Needle Length: 9cm  Needle Gauge: 21     Additional Needles:   Procedures:,,,, ultrasound used (permanent image in chart),,    Narrative:  Start time: 04/05/2023 1:16 PM End time: 04/05/2023 1:22 PM Injection made incrementally with aspirations every 5 mL.  Performed by: Personally  Anesthesiologist: Jairo Ben, MD  Additional Notes: Pt identified in Holding room.  Monitors applied. Working IV access confirmed. Timeout, Sterile prep R clavicle and neck.  #21ga ECHOgenic Arrow block needle to interscalene brachial plexus with US guidance.  30cc 1.5% Lidocaine !:200 epi injected incrementally after negative test dose.  Patient asymptomatic, VSS, no heme aspirated, tolerated well.  Sandford Craze, MD

## 2023-04-05 NOTE — Op Note (Signed)
 OPERATIVE NOTE  DATE: April 05, 2023  PROCEDURE: right first stage basilic vein transposition (brachiobasilic arteriovenous fistula) placement  PRE-OPERATIVE DIAGNOSIS: ESRD  POST-OPERATIVE DIAGNOSIS: same  SURGEON: [name], MD  ASSISTANT(S): Nathanial Rancher, [place]  ANESTHESIA: regional  ESTIMATED BLOOD LOSS: Minimal  FINDING(S): 1.  Basilic vein: 3 mm, acceptable 2.  Brachial artery: 5 mm, disease free 3.  Venous outflow: palpable thrill  4.  Radial flow: palpable radial pulse  SPECIMEN(S):  none  INDICATIONS:   Samuel Burns is a 38 y.o. male who presents with ESRD and the need for permanent hemodialysis access.  The patient is scheduled for right arm AVF versus AVG.  The patient is aware the risks include but are not limited to: bleeding, infection, steal syndrome, nerve damage, ischemic monomelic neuropathy, failure to mature, and need for additional procedures.  The patient is aware of the risks of the procedure and elects to proceed forward.  An assistant was needed given the complexity of case and also to sew the anastomosis.  DESCRIPTION: After full informed written consent was obtained from the patient, the patient was brought back to the operating room and placed supine upon the operating table.  Prior to induction, the patient received IV antibiotics.   After obtaining adequate anesthesia, the patient was then prepped and draped in the standard fashion for a right arm access procedure.  I turned my attention first to identifying the patient's basilic vein and brachial artery.  The basilic vein looked usable in the right arm above the elbow.    I made a transverse incision at the level of the antecubitum and dissected through the subcutaneous tissue and fascia to gain exposure of the brachial artery.  This was noted to be 5 mm in diameter externally.  This was dissected out proximally and distally and controlled with vessel loops .  I then dissected out the  basilic vein.  This was noted to be 3 mm in diameter externally.  The distal segment of the vein was ligated with a  2-0 silk, and the vein was transected.  The proximal segment was interrogated with serial dilators.  The vein accepted up to a 4 mm dilator without any difficulty.  I then instilled the heparinized saline into the vein and clamped it.  At this point, I reset my exposure of the brachial artery and placed the artery under tension proximally and distally.  I made an arteriotomy with a #11 blade, and then I extended the arteriotomy with a Potts scissor.  I injected heparinized saline proximal and distal to this arteriotomy.  The vein was then sewn to the artery in an end-to-side configuration with a running stitch of 6-0 Prolene with the help of my assistant.  Prior to completing this anastomosis, I allowed the vein and artery to backbleed.  There was no evidence of clot from any vessels.  I completed the anastomosis in the usual fashion and then released all vessel loops and clamps.    There was a palpable thrill in the venous outflow, and there was a palpable radial pulse.  At this point, I irrigated out the surgical wound.  There was no further active bleeding.  The subcutaneous tissue was reapproximated with a running stitch of 3-0 Vicryl.  The skin was then reapproximated with a running subcuticular stitch of 4-0 Monocryl.  The skin was then cleaned, dried, and reinforced with Dermabond.  The patient tolerated this procedure well.   COMPLICATIONS: None  CONDITION: Stable  [name] MD [facility] Office: [phone]  [name]   04/05/2023, 3:09 PM

## 2023-04-05 NOTE — Transfer of Care (Signed)
 Immediate Anesthesia Transfer of Care Note  Patient: Samuel Burns  Procedure(s) Performed: RIGHT FIRST STAGE BRACHIOBASILIC ARTERIOVENOUS (AV) FISTULA CREATION (Right: Arm Upper)  Patient Location: PACU  Anesthesia Type:MAC  Level of Consciousness: awake, alert , and oriented  Airway & Oxygen Therapy: Patient Spontanous Breathing  Post-op Assessment: Report given to RN and Post -op Vital signs reviewed and stable  Post vital signs: Reviewed and stable  Last Vitals:  Vitals Value Taken Time  BP 119/79 04/05/23 1520  Temp    Pulse 78 04/05/23 1522  Resp 19 04/05/23 1521  SpO2 100 % 04/05/23 1522  Vitals shown include unfiled device data.  Last Pain:  Vitals:   04/05/23 1332  PainSc: 0-No pain      Patients Stated Pain Goal: 0 (04/05/23 1332)  Complications: No notable events documented.

## 2023-04-05 NOTE — Anesthesia Preprocedure Evaluation (Addendum)
 Anesthesia Evaluation  Patient identified by MRN, date of birth, ID band Patient awake    Reviewed: Allergy & Precautions, NPO status , Patient's Chart, lab work & pertinent test results, reviewed documented beta blocker date and time   History of Anesthesia Complications Negative for: history of anesthetic complications  Airway Mallampati: I  TM Distance: >3 FB Neck ROM: Full    Dental  (+) Dental Advisory Given, Chipped   Pulmonary neg pulmonary ROS   breath sounds clear to auscultation       Cardiovascular hypertension, Pt. on medications and Pt. on home beta blockers (-) angina  Rhythm:Regular Rate:Normal  '21 ECHO: EF 50 to 55%.  1. The LV has low normal function, no regional wall motion abnormalities. There is severe concentric left ventricular hypertrophy. Grade II diastolic dysfunction  (pseudonormalization). Elevated left ventricular end-diastolic pressure.   2. RVF is normal. The right ventricular size is normal. There is normal pulmonary artery systolic pressure.   3. Left atrial size was mildly dilated.   4. Moderate pericardial effusion. The pericardial effusion is circumferential. There is no evidence of cardiac tamponade.   5. The mitral valve is normal in structure. Trivial mitral valve regurgitation. No evidence of mitral stenosis.   6. The aortic valve is tricuspid. Aortic valve regurgitation is not visualized. No aortic stenosis is present.     Neuro/Psych negative neurological ROS     GI/Hepatic negative GI ROS, Neg liver ROS,,,  Endo/Other  negative endocrine ROS    Renal/GU ESRF and DialysisRenal disease (MWF, K+ 4.5)     Musculoskeletal   Abdominal   Peds  Hematology negative hematology ROS (+) Hb 11.9   Anesthesia Other Findings   Reproductive/Obstetrics                             Anesthesia Physical Anesthesia Plan  ASA: 3  Anesthesia Plan: MAC and Regional    Post-op Pain Management: Tylenol PO (pre-op)*   Induction:   PONV Risk Score and Plan: 1 and Treatment may vary due to age or medical condition  Airway Management Planned: Natural Airway and Simple Face Mask  Additional Equipment: None  Intra-op Plan:   Post-operative Plan:   Informed Consent: I have reviewed the patients History and Physical, chart, labs and discussed the procedure including the risks, benefits and alternatives for the proposed anesthesia with the patient or authorized representative who has indicated his/her understanding and acceptance.     Dental advisory given  Plan Discussed with: CRNA and Surgeon  Anesthesia Plan Comments: (Plan routine monitors, interscalene block )        Anesthesia Quick Evaluation

## 2023-04-05 NOTE — H&P (Signed)
 Hospital Consult    Reason for Consult: Right arm AV graft MRN #:  [id number]  History of Present Illness: This is a 38 y.o. male with ESRD that presents for new right arm access.  Most recently underwent left upper arm AV graft placement by Dr. Lenell Antu on 12/03/2022 with removal of his PD catheter.  Ultimately his graft thrombosed.  He has had a previous right arm AV fistula that lasted about 6 to 9 months with a brachiocephalic.  Patient wanted to keep access in the right arm after recent venogram.  Past Medical History:  Diagnosis Date   ADHD (attention deficit hyperactivity disorder)    ADHD   Anemia    Anxiety    Asthma    as a child no current problems as adult   Depression    Hypertension    Renal failure    Dialysis M/W/F    Past Surgical History:  Procedure Laterality Date   AV FISTULA PLACEMENT Right 11/24/2019   Procedure: Creation of RIGHT ARM Brachial/Cephalic ARTERIOVENOUS (AV) FISTULA.;  Surgeon: [name], MD;  Location: [facility] OR;  Service: Vascular;  Laterality: Right;   AV FISTULA PLACEMENT Left 12/22/2021   Procedure: LEFT ARM BRACHIOCEPHALIC ARTERIOVENOUS (AV) FISTULA CREATION;  Surgeon: [name], MD;  Location: [facility] OR;  Service: Vascular;  Laterality: Left;   AV FISTULA PLACEMENT Left 12/03/2022   Procedure: INSERTION OF LEFT ARM ARTERIOVENOUS (AV) GOR-TEX GRAFT;  Surgeon: [name], MD;  Location: [facility] OR;  Service: Vascular;  Laterality: Left;   BASCILIC VEIN TRANSPOSITION Left 01/27/2022   Procedure: LEFT FIRST STAGE BASILIC VEIN TRANSPOSITION;  Surgeon: [name], MD;  Location: [facility];  Service: Vascular;  Laterality: Left;  PERIPHERAL NERVE BLOCK   CAPD INSERTION N/A 09/10/2022   Procedure: LAPAROSCOPIC INSERTION CONTINUOUS AMBULATORY PERITONEAL DIALYSIS  (CAPD) CATHETER;  Surgeon: [name], MD;  Location: [facility];  Service: Vascular;  Laterality: N/A;   CAPD REMOVAL N/A 12/03/2022   Procedure: REMOVAL CONTINUOUS  AMBULATORY PERITONEAL DIALYSIS CATHETER;  Surgeon: [name], MD;  Location: [facility];  Service: Vascular;  Laterality: N/A;   IR AV DIALY SHUNT INTRO NEEDLE/INTRACATH INITIAL W/PTA/IMG RIGHT Right 02/11/2021   IR DIALY SHUNT INTRO NEEDLE/INTRACATH INITIAL W/IMG RIGHT Right 04/30/2020   IR FLUORO GUIDE CV LINE LEFT  11/21/2021   IR FLUORO GUIDE CV LINE RIGHT  11/20/2019   IR REMOVAL TUN CV CATH W/O FL  06/04/2020   IR US GUIDE VASC ACCESS LEFT  11/21/2021   IR US GUIDE VASC ACCESS RIGHT  11/20/2019   IR US GUIDE VASC ACCESS RIGHT  04/30/2020   IR US GUIDE VASC ACCESS RIGHT  02/11/2021   UPPER EXTREMITY VENOGRAPHY Bilateral 02/04/2023   Procedure: UPPER EXTREMITY VENOGRAPHY;  Surgeon: [name], MD;  [facility]: [facility];  Service: Cardiovascular;  Laterality: Bilateral;    No Known Allergies  Prior to Admission medications   Medication Sig Start Date End Date Taking? Authorizing Provider  acetaminophen (TYLENOL) 500 MG tablet Take 1,000 mg by mouth every 6 (six) hours as needed for moderate pain (pain score 4-6) or headache.   Yes [provider]  amLODipine (NORVASC) 10 MG tablet Take 1 tablet (10 mg total) by mouth at bedtime. 12/09/21  Yes Claiborne Rigg, NP  B Complex-C-Zn-Folic Acid (DIALYVITE 800-ZINC 15) 0.8 MG TABS Take 1 tablet by mouth daily. 08/16/22  Yes Marcine Matar, MD  cloNIDine (CATAPRES) 0.1 MG tablet Take 1 tablet (  0.1 mg total) by mouth 2 (two) times daily. 04/05/22  Yes Marcine Matar, MD  fluticasone (FLONASE) 50 MCG/ACT nasal spray Place 2 sprays into both nostrils daily. Patient taking differently: Place 2 sprays into both nostrils daily as needed for allergies. 05/13/21  Yes Waldon Merl, PA-C  hydrOXYzine (VISTARIL) 25 MG capsule Take 1 capsule (25 mg total) by mouth in the morning and at bedtime. 02/05/23  Yes Marcine Matar, MD  losartan (COZAAR) 50 MG tablet Take 1 tablet (50 mg total) by mouth in the morning.  01/22/23  Yes Marcine Matar, MD  Metoprolol Tartrate 75 MG TABS TAKE 1 TABLET BY MOUTH TWICE DAILY 03/10/22 03/30/26 Yes Marcine Matar, MD  Naphazoline HCl (CLEAR EYES OP) Place 1 drop into both eyes in the morning.   Yes [provider]  oxyCODONE-acetaminophen (PERCOCET) 5-325 MG tablet Take 1 tablet by mouth every 6 (six) hours as needed. Patient taking differently: Take 1 tablet by mouth every 6 (six) hours as needed for moderate pain (pain score 4-6). 12/03/22  Yes Rhyne, Samantha J, PA-C  SENSIPAR 60 MG tablet Take 1 tablet (60 mg total) by mouth daily. Patient taking differently: Take 60 mg by mouth daily with supper. 02/24/23  Yes Marcine Matar, MD  sertraline (ZOLOFT) 100 MG tablet Take 1 tablet (100 mg total) by mouth in the morning. 09/03/22  Yes Marcine Matar, MD  sucroferric oxyhydroxide (VELPHORO) 500 MG chewable tablet Chew 500-1,000 mg by mouth See admin instructions. Take 2 tablets (1000 mg) by mouth 3 times daily with meals & take 1 tablet (500 mg) by mouth with snacks. 10/22/21  Yes [provider]  lactulose, encephalopathy, (CHRONULAC) 10 GM/15ML SOLN Take 10 g by mouth daily as needed (Constipation). 09/15/22   [provider]  sildenafil (VIAGRA) 50 MG tablet 1 tab PO 1/2 hr prior to sex PRN.  Limit use to 1 tab/24 hr 07/30/20   Marcine Matar, MD    Social History   Socioeconomic History   Marital status: Single    Spouse name: 1   Number of children: Not on file   Years of education: Not on file   Highest education level: GED or equivalent  Occupational History   Not on file  Tobacco Use   Smoking status: Never    Passive exposure: Never   Smokeless tobacco: Never  Vaping Use   Vaping status: Former   Quit date: 01/01/2022  Substance and Sexual Activity   Alcohol use: No   Drug use: Not Currently   Sexual activity: Yes    Birth control/protection: Condom  Other Topics Concern   Not on file  Social History Narrative    Not on file   Social Drivers of Health   Financial Resource Strain: Low Risk  (01/05/2023)   Overall Financial Resource Strain (CARDIA)    Difficulty of Paying Living Expenses: Not hard at all  Food Insecurity: No Food Insecurity (01/05/2023)   Hunger Vital Sign    Worried About Running Out of Food in the Last Year: Never true    Ran Out of Food in the Last Year: Never true  Transportation Needs: No Transportation Needs (01/05/2023)   PRAPARE - Administrator, Civil Service (Medical): No    Lack of Transportation (Non-Medical): No  Physical Activity: Inactive (01/05/2023)   Exercise Vital Sign    Days of Exercise per Week: 0 days    Minutes of Exercise per Session: 0  min  Stress: No Stress Concern Present (01/05/2023)   [employer] of Occupational Health - Occupational Stress Questionnaire    Feeling of Stress : Not at all  Social Connections: Socially Isolated (01/05/2023)   Social Connection and Isolation Panel [NHANES]    Frequency of Communication with Friends and Family: More than three times a week    Frequency of Social Gatherings with Friends and Family: Once a week    Attends Religious Services: Never    Database administrator or Organizations: No    Attends Banker Meetings: Never    Marital Status: Never married  Intimate Partner Violence: Not At Risk (01/05/2023)   Humiliation, Afraid, Rape, and Kick questionnaire    Fear of Current or Ex-Partner: No    Emotionally Abused: No    Physically Abused: No    Sexually Abused: No     Family History  Problem Relation Age of Onset   Hypertension Maternal Grandmother    Kidney disease Father     ROS: [x]  Positive   [ ]  Negative   [ ]  All sytems reviewed and are negative  Cardiovascular: []  chest pain/pressure []  palpitations []  SOB lying flat []  DOE []  pain in legs while walking []  pain in legs at rest []  pain in legs at night []  non-healing ulcers []  hx of DVT []  swelling in  legs  Pulmonary: []  productive cough []  asthma/wheezing []  home O2  Neurologic: []  weakness in []  arms []  legs []  numbness in []  arms []  legs []  hx of CVA []  mini stroke [] difficulty speaking or slurred speech []  temporary loss of vision in one eye []  dizziness  Hematologic: []  hx of cancer []  bleeding problems []  problems with blood clotting easily  Endocrine:   []  diabetes []  thyroid disease  GI []  vomiting blood []  blood in stool  GU: []  CKD/renal failure []  HD--[]  M/W/F or []  T/T/S []  burning with urination []  blood in urine  Psychiatric: []  anxiety []  depression  Musculoskeletal: []  arthritis []  joint pain  Integumentary: []  rashes []  ulcers  Constitutional: []  fever []  chills   Physical Examination  Vitals:   04/05/23 1006  BP: (!) 135/90  Pulse: 74  Resp: 18  Temp: 98.4 F (36.9 C)  SpO2: 98%   Body mass index is 28.16 kg/m.  General:  WDWN in NAD Gait: Not observed HENT: WNL, normocephalic Pulmonary: normal non-labored breathing, without Rales, rhonchi,  wheezing Cardiac: regular, without  Murmurs, rubs or gallops Abdomen:  soft, NT/ND, no masses Vascular Exam/Pulses: Right brachial and radial pulse palpable  CBC    Component Value Date/Time   WBC 12.5 (H) 08/26/2022 1918   RBC 3.75 (L) 08/26/2022 1918   HGB 11.9 (L) 04/05/2023 1031   HGB 10.4 (L) 12/15/2019 1546   HCT 35.0 (L) 04/05/2023 1031   HCT 32.3 (L) 12/15/2019 1546   PLT 295 08/26/2022 1918   PLT 249 12/15/2019 1546   MCV 96.3 08/26/2022 1918   MCV 92 12/15/2019 1546   MCH 30.9 08/26/2022 1918   MCHC 32.1 08/26/2022 1918   RDW 16.1 (H) 08/26/2022 1918   RDW 13.0 12/15/2019 1546   LYMPHSABS 1.9 08/02/2022 0521   MONOABS 0.8 08/02/2022 0521   EOSABS 0.3 08/02/2022 0521   BASOSABS 0.1 08/02/2022 0521    BMET    Component Value Date/Time   NA 138 04/05/2023 1031   NA 142 04/08/2017 1520   K 4.5 04/05/2023 1031   CL 101 04/05/2023 1031  CO2 26 08/26/2022  1918   GLUCOSE 80 04/05/2023 1031   BUN 47 (H) 04/05/2023 1031   BUN 17 04/08/2017 1520   CREATININE 14.90 (H) 04/05/2023 1031   CALCIUM 9.7 08/26/2022 1918   CALCIUM 8.5 (L) 11/22/2019 0220   GFRNONAA 8 (L) 08/26/2022 1918   GFRAA 2 (L) 11/21/2019 0018    COAGS: Lab Results  Component Value Date   INR 1.1 11/20/2019     Non-Invasive Vascular Imaging:      ASSESSMENT/PLAN: This is a 38 y.o. male with ESRD that presents for new right arm access.  Most recently underwent left upper arm AV graft placement by Dr. Lenell Antu on 12/03/2022 with removal of his PD catheter.  Ultimately his graft thrombosed.  He has had a previous right arm AV fistula that lasted about 6 to 9 months with a brachiocephalic.  Patient wanted to keep access in the right arm after recent venogram.  Discussed we will plan right arm AV graft if he has no surface vein.  Risk benefits discussed.  All questions answered.  [name], MD [facility] Office: [phone]  [name]

## 2023-04-06 ENCOUNTER — Other Ambulatory Visit: Payer: Self-pay

## 2023-04-06 ENCOUNTER — Encounter (HOSPITAL_COMMUNITY): Payer: Self-pay | Admitting: Vascular Surgery

## 2023-04-06 NOTE — Anesthesia Postprocedure Evaluation (Signed)
 Anesthesia Post Note  Patient: Investment banker, operational  Procedure(s) Performed: RIGHT FIRST STAGE BRACHIOBASILIC ARTERIOVENOUS (AV) FISTULA CREATION (Right: Arm Upper)     Patient location during evaluation: SICU Anesthesia Type: Regional Level of consciousness: sedated Pain management: pain level controlled Vital Signs Assessment: post-procedure vital signs reviewed and stable Respiratory status: patient remains intubated per anesthesia plan Cardiovascular status: stable Postop Assessment: no apparent nausea or vomiting Anesthetic complications: no   No notable events documented.  Last Vitals:  Vitals:   04/05/23 1530 04/05/23 1545  BP: (!) 129/94 (!) 135/93  Pulse: 77 61  Resp: 12 10  Temp:  (!) 36.1 C  SpO2: 94% 98%    Last Pain:  Vitals:   04/05/23 1545  PainSc: 0-No pain                 Samuel Burns

## 2023-04-07 ENCOUNTER — Other Ambulatory Visit: Payer: Self-pay

## 2023-04-07 ENCOUNTER — Other Ambulatory Visit (HOSPITAL_COMMUNITY): Payer: Self-pay

## 2023-04-27 ENCOUNTER — Other Ambulatory Visit: Payer: Self-pay | Admitting: Internal Medicine

## 2023-04-27 ENCOUNTER — Other Ambulatory Visit: Payer: Self-pay

## 2023-04-27 ENCOUNTER — Other Ambulatory Visit (HOSPITAL_COMMUNITY): Payer: Self-pay

## 2023-04-27 DIAGNOSIS — I129 Hypertensive chronic kidney disease with stage 1 through stage 4 chronic kidney disease, or unspecified chronic kidney disease: Secondary | ICD-10-CM

## 2023-04-28 ENCOUNTER — Other Ambulatory Visit: Payer: Self-pay

## 2023-04-28 DIAGNOSIS — N186 End stage renal disease: Secondary | ICD-10-CM

## 2023-04-28 MED ORDER — CLONIDINE HCL 0.1 MG PO TABS
0.1000 mg | ORAL_TABLET | Freq: Two times a day (BID) | ORAL | 0 refills | Status: DC
  Filled 2023-04-28: qty 180, 90d supply, fill #0

## 2023-04-28 MED ORDER — HYDROXYZINE PAMOATE 25 MG PO CAPS
25.0000 mg | ORAL_CAPSULE | Freq: Two times a day (BID) | ORAL | 0 refills | Status: DC
  Filled 2023-04-28 – 2023-06-07 (×2): qty 180, 90d supply, fill #0

## 2023-05-01 ENCOUNTER — Other Ambulatory Visit: Payer: Self-pay | Admitting: Internal Medicine

## 2023-05-01 DIAGNOSIS — I129 Hypertensive chronic kidney disease with stage 1 through stage 4 chronic kidney disease, or unspecified chronic kidney disease: Secondary | ICD-10-CM

## 2023-05-01 MED ORDER — METOPROLOL TARTRATE 75 MG PO TABS
1.0000 | ORAL_TABLET | Freq: Two times a day (BID) | ORAL | 1 refills | Status: DC
Start: 2023-05-01 — End: 2023-09-24
  Filled 2023-05-01 – 2023-09-05 (×5): qty 180, 90d supply, fill #0

## 2023-05-03 ENCOUNTER — Other Ambulatory Visit: Payer: Self-pay

## 2023-05-04 ENCOUNTER — Ambulatory Visit (HOSPITAL_COMMUNITY): Payer: 59 | Attending: Vascular Surgery

## 2023-05-04 ENCOUNTER — Other Ambulatory Visit: Payer: Self-pay

## 2023-05-06 ENCOUNTER — Ambulatory Visit (HOSPITAL_COMMUNITY)
Admission: RE | Admit: 2023-05-06 | Discharge: 2023-05-06 | Disposition: A | Discharge status: Home / Self Care | Source: Ambulatory Visit | Attending: Vascular Surgery | Admitting: Vascular Surgery

## 2023-05-06 DIAGNOSIS — N186 End stage renal disease: Secondary | ICD-10-CM | POA: Insufficient documentation

## 2023-05-06 DIAGNOSIS — Z992 Dependence on renal dialysis: Secondary | ICD-10-CM | POA: Diagnosis present

## 2023-05-10 ENCOUNTER — Other Ambulatory Visit: Payer: Self-pay

## 2023-05-11 ENCOUNTER — Ambulatory Visit (INDEPENDENT_AMBULATORY_CARE_PROVIDER_SITE_OTHER): Payer: 59 | Admitting: Physician Assistant

## 2023-05-11 ENCOUNTER — Telehealth: Payer: Self-pay

## 2023-05-11 VITALS — BP 123/82 | HR 75 | Temp 98.2°F | Resp 18 | Ht 68.0 in | Wt 187.8 lb

## 2023-05-11 DIAGNOSIS — N186 End stage renal disease: Secondary | ICD-10-CM

## 2023-05-11 DIAGNOSIS — Z992 Dependence on renal dialysis: Secondary | ICD-10-CM

## 2023-05-11 NOTE — Progress Notes (Signed)
 POST OPERATIVE OFFICE NOTE    CC:  F/u for surgery  HPI:  Samuel Burns is a 38 y.o. male who here for postop visit.  He recently underwent right first stage brachiobasilic fistula creation on 04/05/2023 by Dr. Chestine Spore.  This was done for permanent dialysis access.  Pt returns today for follow up.  Pt states he has been doing great since surgery.  He denies any symptoms of steal in the right hand such as weakness, numbness, excessive coldness, or pain.  He believes that his incisions are healing well without any signs of infection.  He denies any bruising, drainage, or tenderness to his incisions.   No Known Allergies  Current Outpatient Medications  Medication Sig Dispense Refill   acetaminophen (TYLENOL) 500 MG tablet Take 1,000 mg by mouth every 6 (six) hours as needed for moderate pain (pain score 4-6) or headache.     amLODipine (NORVASC) 10 MG tablet Take 1 tablet (10 mg total) by mouth at bedtime. 90 tablet 1   B Complex-C-Zn-Folic Acid (DIALYVITE 800-ZINC 15) 0.8 MG TABS Take 1 tablet by mouth daily. 100 tablet 1   cinacalcet (SENSIPAR) 60 MG tablet Take 1 tablet (60 mg total) by mouth daily. 90 tablet 1   cloNIDine (CATAPRES) 0.1 MG tablet Take 1 tablet (0.1 mg total) by mouth 2 (two) times daily. 180 tablet 0   fluticasone (FLONASE) 50 MCG/ACT nasal spray Place 2 sprays into both nostrils daily. (Patient taking differently: Place 2 sprays into both nostrils daily as needed for allergies.) 16 g 0   hydrOXYzine (VISTARIL) 25 MG capsule Take 1 capsule (25 mg total) by mouth in the morning and at bedtime. 180 capsule 0   lactulose, encephalopathy, (CHRONULAC) 10 GM/15ML SOLN Take 10 g by mouth daily as needed (Constipation).     losartan (COZAAR) 50 MG tablet Take 1 tablet (50 mg total) by mouth in the morning. 30 tablet 4   Metoprolol Tartrate 75 MG TABS Take 1 tablet (75 mg total) by mouth 2 (two) times daily. 180 tablet 1   Naphazoline HCl (CLEAR EYES OP) Place 1 drop into both eyes  in the morning.     oxyCODONE-acetaminophen (PERCOCET) 5-325 MG tablet Take 1 tablet by mouth every 6 (six) hours as needed. 10 tablet 0   sertraline (ZOLOFT) 100 MG tablet Take 1 tablet (100 mg total) by mouth in the morning. 30 tablet 6   sildenafil (VIAGRA) 50 MG tablet 1 tab PO 1/2 hr prior to sex PRN.  Limit use to 1 tab/24 hr 10 tablet 2   sucroferric oxyhydroxide (VELPHORO) 500 MG chewable tablet Chew 500-1,000 mg by mouth See admin instructions. Take 2 tablets (1000 mg) by mouth 3 times daily with meals & take 1 tablet (500 mg) by mouth with snacks.     Current Facility-Administered Medications  Medication Dose Route Frequency Provider Last Rate Last Admin   sodium chloride flush (NS) 0.9 % injection 3 mL  3 mL Intravenous Q12H [name], MD       sodium chloride flush (NS) 0.9 % injection 3 mL  3 mL Intravenous PRN [name], MD         ROS:  See HPI  Physical Exam:   Incision: Right upper arm incision well-healed without signs of infection or drain Extremities: 2+ right radial pulse.  Right brachiobasilic fistula with great thrill throughout the upper arm Neuro: Intact motor and sensation of right upper extremity  Studies: Dialysis Duplex (05/06/2023) AVF  PSV (cm/s)Flow Vol (mL/min)Comments  +--------------------+----------+-----------------+--------+  Native artery inflow   143           916                 +--------------------+----------+-----------------+--------+  AVF Anastomosis        410                               +--------------------+----------+-----------------+--------+     +------------+----------+-------------+----------+--------+  OUTFLOW VEINPSV (cm/s)Diameter (cm)Depth (cm)Describe  +------------+----------+-------------+----------+--------+  Prox UA        329        0.50        0.97             +------------+----------+-------------+----------+--------+  Mid UA         256        0.51         1.11             +------------+----------+-------------+----------+--------+  Dist UA        424        0.56        0.47             +------------+----------+-------------+----------+--------+  AC Fossa       399        0.31        0.52             +------------+----------+-------------+----------+--------+     Assessment/Plan:  This is a 38 y.o. male who is here for postop check  -The patient recently underwent creation of right for stage brachiobasilic fistula -His right arm incision is well-healed without signs of infection, drainage, or dehiscence -Duplex demonstrates a well maturing fistula with flow volumes of 916 mL/min.  Diameters throughout most of the arm are near 6 mm -He denies any symptoms of steal in the right hand.  He has a 2+ right radial pulse.  He has intact motor and sensation of the right upper extremity.  His fistula has a great thrill on exam throughout the upper arm -For the time being, the patient can continue dialysis through his [facility].  Will plan for right second stage brachiobasilic transposition and superficialization in a couple of weeks with Dr. Tristan Schroeder, PA-C [facility] [phone]   Clinic MD:  Steve Rattler

## 2023-05-11 NOTE — Telephone Encounter (Signed)
 Attempted to call for surgery scheduling. LVM

## 2023-05-20 ENCOUNTER — Other Ambulatory Visit: Payer: Self-pay

## 2023-05-20 DIAGNOSIS — N186 End stage renal disease: Secondary | ICD-10-CM

## 2023-06-03 NOTE — Progress Notes (Addendum)
 SDW CALL  Unable to reach patient over phone. Voicemail left with instructions -see pre op call checklist. Left phone number for patient to call if he has questions. -[phone]  PCP - Murlene Army Cardiologist -   PPM/ICD -  Device Orders -  Rep Notified -   Chest x-ray - 02/06/22 EKG - 08/02/22 Stress Test -  ECHO - 11/22/19 Cardiac Cath -   Sleep Study -  CPAP -   Fasting Blood Sugar - na Checks Blood Sugar _____ times a day  Blood Thinner Instructions:na Aspirin Instructions:na  ERAS Protcol -no PRE-SURGERY Ensure or G2-   COVID TEST- no   Anesthesia review: no       Oral Hygiene is also important to reduce your risk of infection.  Remember - BRUSH YOUR TEETH THE MORNING OF SURGERY WITH YOUR REGULAR TOOTHPASTE

## 2023-06-06 NOTE — Anesthesia Preprocedure Evaluation (Signed)
 Anesthesia Evaluation    Reviewed: Allergy & Precautions, Patient's Chart, lab work & pertinent test results, Unable to perform ROS - Chart review only  History of Anesthesia Complications Negative for: history of anesthetic complications  Airway        Dental   Pulmonary neg pulmonary ROS          Cardiovascular hypertension, Pt. on medications and Pt. on home beta blockers (-) angina   '21 ECHO: EF 50 to 55%.  1. The LV has low normal function, no regional wall motion abnormalities. There is severe concentric left ventricular hypertrophy. Grade II diastolic dysfunction  (pseudonormalization). Elevated left ventricular end-diastolic pressure.   2. RVF is normal. The right ventricular size is normal. There is normal pulmonary artery systolic pressure.   3. Left atrial size was mildly dilated.   4. Moderate pericardial effusion. The pericardial effusion is circumferential. There is no evidence of cardiac tamponade.   5. The mitral valve is normal in structure. Trivial mitral valve regurgitation. No evidence of mitral stenosis.   6. The aortic valve is tricuspid. Aortic valve regurgitation is not visualized. No aortic stenosis is present.     Neuro/Psych  PSYCHIATRIC DISORDERS Anxiety Depression    negative neurological ROS     GI/Hepatic negative GI ROS, Neg liver ROS,,,  Endo/Other  negative endocrine ROS    Renal/GU ESRF and DialysisRenal disease (MWF, K+ 4.5)     Musculoskeletal   Abdominal   Peds  Hematology  (+) Blood dyscrasia, anemia Hb 11.9   Anesthesia Other Findings   Reproductive/Obstetrics                             Anesthesia Physical Anesthesia Plan  ASA: 3  Anesthesia Plan:    Post-op Pain Management: Tylenol  PO (pre-op )*   Induction:   PONV Risk Score and Plan: 1 and Treatment may vary due to age or medical condition, Propofol  infusion and TIVA  Airway Management  Planned:   Additional Equipment:   Intra-op Plan:   Post-operative Plan:   Informed Consent:   Plan Discussed with:   Anesthesia Plan Comments:         Anesthesia Quick Evaluation

## 2023-06-07 ENCOUNTER — Encounter (HOSPITAL_COMMUNITY): Payer: Self-pay | Admitting: Anesthesiology

## 2023-06-07 ENCOUNTER — Ambulatory Visit (HOSPITAL_COMMUNITY): Admission: RE | Admit: 2023-06-07 | Source: Home / Self Care | Admitting: Vascular Surgery

## 2023-06-07 ENCOUNTER — Other Ambulatory Visit: Payer: Self-pay | Admitting: Internal Medicine

## 2023-06-07 SURGERY — FISTULA SUPERFICIALIZATION
Anesthesia: Choice | Laterality: Right

## 2023-06-07 NOTE — Progress Notes (Signed)
 Called patient to question time of arrival for today's procedure. Patient stated he was on his way to dialysis and was not coming in for surgery today as scheduled. Dr. Fulton Job made aware. Cancelled case due to scheduling conflict. OR made aware.

## 2023-06-08 ENCOUNTER — Other Ambulatory Visit: Payer: Self-pay

## 2023-06-15 ENCOUNTER — Other Ambulatory Visit: Payer: Self-pay

## 2023-06-18 DIAGNOSIS — Z4800 Encounter for change or removal of nonsurgical wound dressing: Secondary | ICD-10-CM | POA: Diagnosis not present

## 2023-06-18 DIAGNOSIS — Z992 Dependence on renal dialysis: Secondary | ICD-10-CM | POA: Diagnosis not present

## 2023-06-18 DIAGNOSIS — D689 Coagulation defect, unspecified: Secondary | ICD-10-CM | POA: Diagnosis not present

## 2023-06-18 DIAGNOSIS — N2581 Secondary hyperparathyroidism of renal origin: Secondary | ICD-10-CM | POA: Diagnosis not present

## 2023-06-18 DIAGNOSIS — N186 End stage renal disease: Secondary | ICD-10-CM | POA: Diagnosis not present

## 2023-06-21 DIAGNOSIS — Z4800 Encounter for change or removal of nonsurgical wound dressing: Secondary | ICD-10-CM | POA: Diagnosis not present

## 2023-06-21 DIAGNOSIS — N2581 Secondary hyperparathyroidism of renal origin: Secondary | ICD-10-CM | POA: Diagnosis not present

## 2023-06-21 DIAGNOSIS — D689 Coagulation defect, unspecified: Secondary | ICD-10-CM | POA: Diagnosis not present

## 2023-06-21 DIAGNOSIS — Z992 Dependence on renal dialysis: Secondary | ICD-10-CM | POA: Diagnosis not present

## 2023-06-21 DIAGNOSIS — N186 End stage renal disease: Secondary | ICD-10-CM | POA: Diagnosis not present

## 2023-06-23 ENCOUNTER — Other Ambulatory Visit: Payer: Self-pay

## 2023-06-23 ENCOUNTER — Encounter (HOSPITAL_COMMUNITY): Payer: Self-pay | Admitting: Vascular Surgery

## 2023-06-23 DIAGNOSIS — N186 End stage renal disease: Secondary | ICD-10-CM | POA: Diagnosis not present

## 2023-06-23 DIAGNOSIS — Z4800 Encounter for change or removal of nonsurgical wound dressing: Secondary | ICD-10-CM | POA: Diagnosis not present

## 2023-06-23 DIAGNOSIS — D689 Coagulation defect, unspecified: Secondary | ICD-10-CM | POA: Diagnosis not present

## 2023-06-23 DIAGNOSIS — N2581 Secondary hyperparathyroidism of renal origin: Secondary | ICD-10-CM | POA: Diagnosis not present

## 2023-06-23 DIAGNOSIS — Z992 Dependence on renal dialysis: Secondary | ICD-10-CM | POA: Diagnosis not present

## 2023-06-23 NOTE — Progress Notes (Signed)
 PCP - Dr Concetta Dee Cardiologist - none Nephrology - Dr Truman [facility] - [facility] on M/W/F  Chest x-ray - 02/06/22 EKG - 08/02/22 Stress Test - n/a ECHO - 08/27/20 CE Cardiac Cath - n/a  ICD Pacemaker/Loop - n/a  Sleep Study -  n/a  Diabetes - n/a  Aspirin & Blood Thinner Instructions:  n/a  NPO    Anesthesia review: Yes  STOP now taking any Aspirin (unless otherwise instructed by your surgeon), Aleve, Naproxen, Ibuprofen , Motrin , Advil , Goody's, BC's, all herbal medications, fish oil, and all vitamins.   Coronavirus Screening Do you have any of the following symptoms:  Cough yes/no: No Fever (>100.60F)  yes/no: No Runny nose yes/no: No Sore throat yes/no: No Difficulty breathing/shortness of breath  yes/no: No  Have you traveled in the last 14 days and where? yes/no: No  Patient verbalized understanding of instructions that were given via phone.

## 2023-06-24 ENCOUNTER — Ambulatory Visit (HOSPITAL_COMMUNITY)
Admission: RE | Admit: 2023-06-24 | Discharge: 2023-06-24 | Disposition: A | Discharge status: Home / Self Care | Attending: Vascular Surgery | Admitting: Vascular Surgery

## 2023-06-24 ENCOUNTER — Ambulatory Visit (HOSPITAL_COMMUNITY): Admitting: Vascular Surgery

## 2023-06-24 ENCOUNTER — Encounter (HOSPITAL_COMMUNITY)
Admission: RE | Disposition: A | Discharge status: Home / Self Care | Payer: Self-pay | Source: Home / Self Care | Attending: Vascular Surgery

## 2023-06-24 ENCOUNTER — Encounter (HOSPITAL_COMMUNITY): Payer: Self-pay | Admitting: Vascular Surgery

## 2023-06-24 ENCOUNTER — Other Ambulatory Visit: Payer: Self-pay

## 2023-06-24 ENCOUNTER — Other Ambulatory Visit (HOSPITAL_COMMUNITY): Payer: Self-pay

## 2023-06-24 DIAGNOSIS — N186 End stage renal disease: Secondary | ICD-10-CM

## 2023-06-24 DIAGNOSIS — I120 Hypertensive chronic kidney disease with stage 5 chronic kidney disease or end stage renal disease: Secondary | ICD-10-CM | POA: Diagnosis not present

## 2023-06-24 DIAGNOSIS — Z992 Dependence on renal dialysis: Secondary | ICD-10-CM | POA: Diagnosis not present

## 2023-06-24 DIAGNOSIS — J45909 Unspecified asthma, uncomplicated: Secondary | ICD-10-CM | POA: Insufficient documentation

## 2023-06-24 DIAGNOSIS — D631 Anemia in chronic kidney disease: Secondary | ICD-10-CM | POA: Diagnosis not present

## 2023-06-24 DIAGNOSIS — N185 Chronic kidney disease, stage 5: Secondary | ICD-10-CM | POA: Diagnosis not present

## 2023-06-24 HISTORY — PX: BASCILIC VEIN TRANSPOSITION: SHX5742

## 2023-06-24 SURGERY — TRANSPOSITION, VEIN, BASILIC
Anesthesia: Regional | Laterality: Right

## 2023-06-24 MED ORDER — PHENYLEPHRINE 80 MCG/ML (10ML) SYRINGE FOR IV PUSH (FOR BLOOD PRESSURE SUPPORT)
PREFILLED_SYRINGE | INTRAVENOUS | Status: AC
  Filled 2023-06-24: qty 10

## 2023-06-24 MED ORDER — ACETAMINOPHEN 10 MG/ML IV SOLN
INTRAVENOUS | Status: DC
Start: 2023-06-24 — End: 2023-06-24
  Filled 2023-06-24: qty 100

## 2023-06-24 MED ORDER — DEXAMETHASONE SODIUM PHOSPHATE 10 MG/ML IJ SOLN
INTRAMUSCULAR | Status: AC
  Filled 2023-06-24: qty 1

## 2023-06-24 MED ORDER — ONDANSETRON HCL 4 MG/2ML IJ SOLN
INTRAMUSCULAR | Status: AC
Start: 2023-06-24 — End: ?
  Filled 2023-06-24: qty 2

## 2023-06-24 MED ORDER — NEOSTIGMINE METHYLSULFATE 3 MG/3ML IV SOSY
PREFILLED_SYRINGE | INTRAVENOUS | Status: AC
  Filled 2023-06-24: qty 3

## 2023-06-24 MED ORDER — LIDOCAINE HCL (PF) 1 % IJ SOLN
INTRAMUSCULAR | Status: AC
  Filled 2023-06-24: qty 30

## 2023-06-24 MED ORDER — CHLORHEXIDINE GLUCONATE 0.12 % MT SOLN
15.0000 mL | Freq: Once | OROMUCOSAL | Status: AC
  Administered 2023-06-24: 15 mL via OROMUCOSAL
  Filled 2023-06-24: qty 15

## 2023-06-24 MED ORDER — OXYCODONE HCL 5 MG PO TABS: 5.0000 mg | ORAL_TABLET | Freq: Once | ORAL | Status: AC | PRN

## 2023-06-24 MED ORDER — LIDOCAINE-EPINEPHRINE (PF) 1.5 %-1:200000 IJ SOLN
INTRAMUSCULAR | Status: DC | PRN
  Administered 2023-06-24: 20 mg via PERINEURAL

## 2023-06-24 MED ORDER — FENTANYL CITRATE (PF) 100 MCG/2ML IJ SOLN
INTRAMUSCULAR | Status: DC | PRN
  Administered 2023-06-24: 100 ug via INTRAVENOUS

## 2023-06-24 MED ORDER — ORAL CARE MOUTH RINSE: 15.0000 mL | Freq: Once | OROMUCOSAL | Status: AC

## 2023-06-24 MED ORDER — SODIUM CHLORIDE 0.9 % IV SOLN: INTRAVENOUS | Status: DC

## 2023-06-24 MED ORDER — ONDANSETRON HCL 4 MG/2ML IJ SOLN
INTRAMUSCULAR | Status: AC
  Filled 2023-06-24: qty 2

## 2023-06-24 MED ORDER — HEPARIN 6000 UNIT IRRIGATION SOLUTION
Status: AC
  Filled 2023-06-24: qty 500

## 2023-06-24 MED ORDER — PROPOFOL 10 MG/ML IV BOLUS
INTRAVENOUS | Status: DC | PRN
  Administered 2023-06-24: 100 mg via INTRAVENOUS

## 2023-06-24 MED ORDER — FENTANYL CITRATE (PF) 100 MCG/2ML IJ SOLN
INTRAMUSCULAR | Status: AC
  Filled 2023-06-24: qty 2

## 2023-06-24 MED ORDER — DROPERIDOL 2.5 MG/ML IJ SOLN: 0.6250 mg | Freq: Once | INTRAMUSCULAR | Status: DC | PRN

## 2023-06-24 MED ORDER — ARTIFICIAL TEARS OPHTHALMIC OINT
TOPICAL_OINTMENT | OPHTHALMIC | Status: AC
  Filled 2023-06-24: qty 3.5

## 2023-06-24 MED ORDER — PHENYLEPHRINE 80 MCG/ML (10ML) SYRINGE FOR IV PUSH (FOR BLOOD PRESSURE SUPPORT)
PREFILLED_SYRINGE | INTRAVENOUS | Status: DC | PRN
  Administered 2023-06-24: 80 ug via INTRAVENOUS

## 2023-06-24 MED ORDER — PROPOFOL 500 MG/50ML IV EMUL
INTRAVENOUS | Status: DC | PRN
  Administered 2023-06-24: 100 ug/kg/min via INTRAVENOUS

## 2023-06-24 MED ORDER — PHENYLEPHRINE HCL-NACL 20-0.9 MG/250ML-% IV SOLN
INTRAVENOUS | Status: DC | PRN
  Administered 2023-06-24: 50 ug/min via INTRAVENOUS

## 2023-06-24 MED ORDER — 0.9 % SODIUM CHLORIDE (POUR BTL) OPTIME
TOPICAL | Status: DC | PRN
  Administered 2023-06-24: 1000 mL

## 2023-06-24 MED ORDER — FENTANYL CITRATE (PF) 100 MCG/2ML IJ SOLN
25.0000 ug | INTRAMUSCULAR | Status: DC | PRN
  Administered 2023-06-24: 25 ug via INTRAVENOUS

## 2023-06-24 MED ORDER — LIDOCAINE 2% (20 MG/ML) 5 ML SYRINGE
INTRAMUSCULAR | Status: AC
  Filled 2023-06-24: qty 5

## 2023-06-24 MED ORDER — MIDAZOLAM HCL 2 MG/2ML IJ SOLN
INTRAMUSCULAR | Status: DC | PRN
  Administered 2023-06-24: 2 mg via INTRAVENOUS

## 2023-06-24 MED ORDER — EPHEDRINE 5 MG/ML INJ
INTRAVENOUS | Status: AC
  Filled 2023-06-24: qty 5

## 2023-06-24 MED ORDER — FENTANYL CITRATE (PF) 250 MCG/5ML IJ SOLN
INTRAMUSCULAR | Status: AC
  Filled 2023-06-24: qty 5

## 2023-06-24 MED ORDER — FENTANYL CITRATE (PF) 250 MCG/5ML IJ SOLN
INTRAMUSCULAR | Status: DC | PRN
Start: 2023-06-24 — End: 2023-06-24
  Administered 2023-06-24 (×2): 50 ug via INTRAVENOUS

## 2023-06-24 MED ORDER — OXYCODONE HCL 5 MG/5ML PO SOLN
ORAL | Status: DC
Start: 2023-06-24 — End: 2023-06-24
  Filled 2023-06-24: qty 5

## 2023-06-24 MED ORDER — ROCURONIUM BROMIDE 10 MG/ML (PF) SYRINGE
PREFILLED_SYRINGE | INTRAVENOUS | Status: AC
  Filled 2023-06-24: qty 10

## 2023-06-24 MED ORDER — DEXAMETHASONE SODIUM PHOSPHATE 10 MG/ML IJ SOLN
INTRAMUSCULAR | Status: DC | PRN
  Administered 2023-06-24: 10 mg via INTRAVENOUS

## 2023-06-24 MED ORDER — FENTANYL CITRATE (PF) 100 MCG/2ML IJ SOLN
INTRAMUSCULAR | Status: DC
Start: 2023-06-24 — End: 2023-06-24
  Filled 2023-06-24: qty 2

## 2023-06-24 MED ORDER — ACETAMINOPHEN 10 MG/ML IV SOLN
1000.0000 mg | Freq: Once | INTRAVENOUS | Status: DC | PRN
  Administered 2023-06-24: 1000 mg via INTRAVENOUS

## 2023-06-24 MED ORDER — CEFAZOLIN SODIUM-DEXTROSE 2-3 GM-%(50ML) IV SOLR
INTRAVENOUS | Status: DC | PRN
  Administered 2023-06-24: 2 g via INTRAVENOUS

## 2023-06-24 MED ORDER — PROPOFOL 1000 MG/100ML IV EMUL
INTRAVENOUS | Status: AC
  Filled 2023-06-24: qty 300

## 2023-06-24 MED ORDER — HEPARIN SODIUM (PORCINE) 1000 UNIT/ML IJ SOLN
INTRAMUSCULAR | Status: AC
  Filled 2023-06-24: qty 20

## 2023-06-24 MED ORDER — SUCCINYLCHOLINE CHLORIDE 200 MG/10ML IV SOSY
PREFILLED_SYRINGE | INTRAVENOUS | Status: AC
  Filled 2023-06-24: qty 10

## 2023-06-24 MED ORDER — GLYCOPYRROLATE PF 0.2 MG/ML IJ SOSY
PREFILLED_SYRINGE | INTRAMUSCULAR | Status: AC
  Filled 2023-06-24: qty 1

## 2023-06-24 MED ORDER — OXYCODONE-ACETAMINOPHEN 5-325 MG PO TABS
1.0000 | ORAL_TABLET | Freq: Four times a day (QID) | ORAL | 0 refills | Status: DC | PRN
  Filled 2023-06-24: qty 20, 5d supply, fill #0

## 2023-06-24 MED ORDER — MIDAZOLAM HCL 2 MG/2ML IJ SOLN
INTRAMUSCULAR | Status: AC
  Filled 2023-06-24: qty 2

## 2023-06-24 MED ORDER — ONDANSETRON HCL 4 MG/2ML IJ SOLN
INTRAMUSCULAR | Status: DC | PRN
  Administered 2023-06-24: 4 mg via INTRAVENOUS

## 2023-06-24 MED ORDER — MIDAZOLAM HCL 2 MG/2ML IJ SOLN
INTRAMUSCULAR | Status: DC | PRN
Start: 2023-06-24 — End: 2023-06-24

## 2023-06-24 MED ORDER — HEPARIN 6000 UNIT IRRIGATION SOLUTION
Status: DC | PRN
  Administered 2023-06-24: 1

## 2023-06-24 MED ORDER — OXYCODONE HCL 5 MG/5ML PO SOLN
5.0000 mg | Freq: Once | ORAL | Status: AC | PRN
  Administered 2023-06-24: 5 mg via ORAL

## 2023-06-24 SURGICAL SUPPLY — 30 items
ARMBAND PINK RESTRICT EXTREMIT (MISCELLANEOUS) ×1 IMPLANT
BAG COUNTER SPONGE SURGICOUNT (BAG) ×1 IMPLANT
BENZOIN TINCTURE PRP APPL 2/3 (GAUZE/BANDAGES/DRESSINGS) ×1 IMPLANT
CANISTER SUCTION 3000ML PPV (SUCTIONS) ×1 IMPLANT
CANNULA VESSEL 3MM 2 BLNT TIP (CANNULA) ×1 IMPLANT
CHLORAPREP W/TINT 26 (MISCELLANEOUS) ×1 IMPLANT
CLIP LIGATING EXTRA MED SLVR (CLIP) ×1 IMPLANT
CLIP LIGATING EXTRA SM BLUE (MISCELLANEOUS) ×1 IMPLANT
COVER PROBE W GEL 5X96 (DRAPES) ×1 IMPLANT
DRSG TEGADERM 4X10 (GAUZE/BANDAGES/DRESSINGS) IMPLANT
ELECTRODE REM PT RTRN 9FT ADLT (ELECTROSURGICAL) ×1 IMPLANT
GLOVE BIO SURGEON STRL SZ8 (GLOVE) ×1 IMPLANT
GOWN STRL REUS W/ TWL LRG LVL3 (GOWN DISPOSABLE) ×2 IMPLANT
GOWN STRL REUS W/ TWL XL LVL3 (GOWN DISPOSABLE) ×1 IMPLANT
KIT BASIN OR (CUSTOM PROCEDURE TRAY) ×1 IMPLANT
KIT TURNOVER KIT B (KITS) ×1 IMPLANT
NS IRRIG 1000ML POUR BTL (IV SOLUTION) ×1 IMPLANT
PACK CV ACCESS (CUSTOM PROCEDURE TRAY) ×1 IMPLANT
PAD ARMBOARD POSITIONER FOAM (MISCELLANEOUS) ×2 IMPLANT
SLING ARM FOAM STRAP LRG (SOFTGOODS) IMPLANT
SLING ARM FOAM STRAP MED (SOFTGOODS) IMPLANT
STRIP CLOSURE SKIN 1/2X4 (GAUZE/BANDAGES/DRESSINGS) ×2 IMPLANT
SUT MNCRL AB 4-0 PS2 18 (SUTURE) ×1 IMPLANT
SUT PROLENE 5 0 C 1 24 (SUTURE) IMPLANT
SUT PROLENE 6 0 BV (SUTURE) ×1 IMPLANT
SUT SILK 2 0 SH (SUTURE) IMPLANT
SUT VIC AB 3-0 SH 27X BRD (SUTURE) ×1 IMPLANT
TOWEL GREEN STERILE (TOWEL DISPOSABLE) ×1 IMPLANT
UNDERPAD 30X36 HEAVY ABSORB (UNDERPADS AND DIAPERS) ×1 IMPLANT
WATER STERILE IRR 1000ML POUR (IV SOLUTION) ×1 IMPLANT

## 2023-06-24 NOTE — Op Note (Signed)
 DATE OF SERVICE: 06/24/2023  PATIENT:  Samuel Burns  38 y.o. male  PRE-OPERATIVE DIAGNOSIS:  ESRD  POST-OPERATIVE DIAGNOSIS:  Same  PROCEDURE:   Right second stage basilic vein transposition  SURGEON:  Surgeons and Role:    * Carlene Che, MD - Primary  ASSISTANT: Naida Austria, PA-C  An experienced assistant was required given the complexity of this procedure and the standard of surgical care. My assistant helped with exposure through counter tension, suctioning, ligation and retraction to better visualize the surgical field.  My assistant expedited sewing during the case by following my sutures. Wherever I use the term "we" in the report, my assistant actively helped me with that portion of the procedure.  ANESTHESIA:   regional and general  EBL: 50mL  BLOOD ADMINISTERED:none  DRAINS: none   LOCAL MEDICATIONS USED:  NONE  SPECIMEN:  none  COUNTS: confirmed correct.  TOURNIQUET:  none  PATIENT DISPOSITION:  PACU - hemodynamically stable.   Delay start of Pharmacological VTE agent (>24hrs) due to surgical blood loss or risk of bleeding: no  INDICATION FOR PROCEDURE: [name] is a 38 y.o. male with ESRD. My partner previously created a first stage basilic vein transposition for him 04/05/23. After careful discussion of risks, benefits, and alternatives the patient was offered basilic vein transposition. The patient understood and wished to proceed.  OPERATIVE FINDINGS: Marginal basilic vein fistula.  Transposed without difficulty.  Mildly pulsatile thrill and good doppler bruit at completion  DESCRIPTION OF PROCEDURE: After identification of the patient in the pre-operative holding area, the patient was transferred to the operating room. The patient was positioned supine on the operating room table. Anesthesia was induced. The right arm was prepped and draped in standard fashion. A surgical pause was performed confirming correct patient, procedure, and operative  location.  Using intraoperative ultrasound the course of the right basilic vein was marked on the skin.  3 skip incisions were made over the course of the basilic vein fistula.  These were carried down through subcutaneous tissue until the fistula was encountered.  The fistula was skeletonized from the axilla to the anastomosis, taking care to ligate and divide sidebranches on the fistula, and to protect the medial antebrachial cutaneous nerve.   A subcutaneous tunnel was created over the biceps using a sheath tunneling device.  Patient was heparinized.  The fistula near the axilla was clamped.  The outflow in the axilla was clamped.  The fistula was divided.  The fistula was marked to ensure no twisting or kinking while delivering the fistula through the tunnel.  The fistula was tunneled through the arm and delivered near the previous anastomosis.  The fistula was spatulated proximally and distally.  The fistula was reanastomosed end-to-end using continuous running suture of 5-0 Prolene.  A palpable thrill was felt over the subcutaneous course of the tunnel.  Doppler flow was excellent in the proximal and distal fistula.  The wounds were copiously irrigated.  Hemostasis was ensured in the surgical bed.  The wounds were closed in layers using 3-0 Vicryl and 4-0 Monocryl.  Upon completion of the case instrument and sharps counts were confirmed correct. The patient was transferred to the PACU in good condition. I was present for all portions of the procedure.  FOLLOW UP PLAN: Assuming a normal postoperative course, VVS PA will see the patient in 6 weeks with AVF duplex.   [name]. [name], MD [facility] Phone Number: [phone] 06/24/2023 7:36  PM

## 2023-06-24 NOTE — H&P (Signed)
 See below for H&P details which have not changed. Plan right second stage basilic fistula transposition today in OR.  Samuel Burns. Samuel Goodwill, MD FACS [facility] Phone Number: [phone] 06/24/2023 12:15 PM    POST OPERATIVE OFFICE NOTE    CC:  F/u for surgery  HPI:  Samuel Burns is a 38 y.o. male who here for postop visit.  He recently underwent right first stage brachiobasilic fistula creation on 04/05/2023 by Dr. Fulton Burns.  This was done for permanent dialysis access.  Pt returns today for follow up.  Pt states he has been doing great since surgery.  He denies any symptoms of steal in the right hand such as weakness, numbness, excessive coldness, or pain.  He believes that his incisions are healing well without any signs of infection.  He denies any bruising, drainage, or tenderness to his incisions.   No Known Allergies  Current Facility-Administered Medications  Medication Dose Route Frequency Provider Last Rate Last Admin   0.9 %  sodium chloride  infusion   Intravenous Continuous Hollis, Kevin D, MD 10 mL/hr at 06/24/23 1058 New Bag at 06/24/23 1058   fentaNYL  (SUBLIMAZE ) 100 MCG/2ML injection            midazolam  (VERSED ) 2 MG/2ML injection              ROS:  See HPI  Physical Exam:   Incision: Right upper arm incision well-healed without signs of infection or drain Extremities: 2+ right radial pulse.  Right brachiobasilic fistula with great thrill throughout the upper arm Neuro: Intact motor and sensation of right upper extremity  Studies: Dialysis Duplex (05/06/2023) AVF                 PSV (cm/s)Flow Vol (mL/min)Comments  +--------------------+----------+-----------------+--------+  Native artery inflow   143           916                 +--------------------+----------+-----------------+--------+  AVF Anastomosis        410                               +--------------------+----------+-----------------+--------+      +------------+----------+-------------+----------+--------+  OUTFLOW VEINPSV (cm/s)Diameter (cm)Depth (cm)Describe  +------------+----------+-------------+----------+--------+  Prox UA        329        0.50        0.97             +------------+----------+-------------+----------+--------+  Mid UA         256        0.51        1.11             +------------+----------+-------------+----------+--------+  Dist UA        424        0.56        0.47             +------------+----------+-------------+----------+--------+  AC Fossa       399        0.31        0.52             +------------+----------+-------------+----------+--------+     Assessment/Plan:  This is a 38 y.o. male who is here for postop check  -The patient recently underwent creation of right for stage brachiobasilic fistula -His right arm incision is well-healed without signs of infection, drainage,  or dehiscence -Duplex demonstrates a well maturing fistula with flow volumes of 916 mL/min.  Diameters throughout most of the arm are near 6 mm -He denies any symptoms of steal in the right hand.  He has a 2+ right radial pulse.  He has intact motor and sensation of the right upper extremity.  His fistula has a great thrill on exam throughout the upper arm -For the time being, the patient can continue dialysis through his [facility].  Will plan for right second stage brachiobasilic transposition and superficialization in a couple of weeks with Dr. Burnell Carry, PA-C [facility] [phone]   Clinic MD:  Samuel Burns

## 2023-06-24 NOTE — Anesthesia Preprocedure Evaluation (Addendum)
 Anesthesia Evaluation  Patient identified by MRN, date of birth, ID band Patient awake    Reviewed: Allergy & Precautions, NPO status , Patient's Chart, lab work & pertinent test results  Airway Mallampati: I  TM Distance: >3 FB Neck ROM: Full    Dental  (+) Teeth Intact, Dental Advisory Given   Pulmonary asthma    breath sounds clear to auscultation       Cardiovascular hypertension, Pt. on medications and Pt. on home beta blockers  Rhythm:Regular Rate:Normal  Echo:  1. Left ventricular ejection fraction, by estimation, is 50 to 55%. The  left ventricle has low normal function. The left ventricle has no regional  wall motion abnormalities. There is severe concentric left ventricular  hypertrophy. Left ventricular  diastolic parameters are consistent with Grade II diastolic dysfunction  (pseudonormalization). Elevated left ventricular end-diastolic pressure.   2. Right ventricular systolic function is normal. The right ventricular  size is normal. There is normal pulmonary artery systolic pressure.   3. Left atrial size was mildly dilated.   4. Moderate pericardial effusion. The pericardial effusion is  circumferential. There is no evidence of cardiac tamponade.   5. The mitral valve is normal in structure. Trivial mitral valve  regurgitation. No evidence of mitral stenosis.   6. The aortic valve is tricuspid. Aortic valve regurgitation is not  visualized. No aortic stenosis is present.   7. The inferior vena cava is normal in size with greater than 50%  respiratory variability, suggesting right atrial pressure of 3 mmHg.     Neuro/Psych  PSYCHIATRIC DISORDERS Anxiety Depression    negative neurological ROS     GI/Hepatic negative GI ROS, Neg liver ROS,,,  Endo/Other  negative endocrine ROS    Renal/GU ESRF and DialysisRenal disease     Musculoskeletal negative musculoskeletal ROS (+)    Abdominal   Peds   Hematology  (+) Blood dyscrasia, anemia   Anesthesia Other Findings   Reproductive/Obstetrics                             Anesthesia Physical Anesthesia Plan  ASA: 3  Anesthesia Plan: Regional   Post-op Pain Management: Minimal or no pain anticipated   Induction: Intravenous  PONV Risk Score and Plan: 2 and Ondansetron   Airway Management Planned: Natural Airway and Simple Face Mask  Additional Equipment: None  Intra-op Plan:   Post-operative Plan:   Informed Consent: I have reviewed the patients History and Physical, chart, labs and discussed the procedure including the risks, benefits and alternatives for the proposed anesthesia with the patient or authorized representative who has indicated his/her understanding and acceptance.       Plan Discussed with: CRNA  Anesthesia Plan Comments:        Anesthesia Quick Evaluation

## 2023-06-24 NOTE — Anesthesia Postprocedure Evaluation (Signed)
 Anesthesia Post Note  Patient: Samuel Burns  Procedure(s) Performed: TRANSPOSITION, VEIN, BASILIC (Right)     Patient location during evaluation: PACU Anesthesia Type: General Level of consciousness: awake and alert Pain management: pain level controlled Vital Signs Assessment: post-procedure vital signs reviewed and stable Respiratory status: spontaneous breathing, nonlabored ventilation, respiratory function stable and patient connected to nasal cannula oxygen Cardiovascular status: blood pressure returned to baseline and stable Postop Assessment: no apparent nausea or vomiting Anesthetic complications: no  No notable events documented.  Last Vitals:  Vitals:   06/24/23 1515 06/24/23 1530  BP: 108/64 (!) 104/53  Pulse: 74 65  Resp: (!) 8 (!) 9  Temp:  37.1 C  SpO2: 100% 100%    Last Pain:  Vitals:   06/24/23 1515  TempSrc:   PainSc: Asleep                 Willian Harrow

## 2023-06-24 NOTE — Anesthesia Procedure Notes (Signed)
 Procedure Name: LMA Insertion Date/Time: 06/24/2023 1:10 PM  Performed by: Jeno Calleros A, CRNAPre-anesthesia Checklist: Patient identified, Emergency Drugs available, Suction available and Patient being monitored Patient Re-evaluated:Patient Re-evaluated prior to induction Oxygen Delivery Method: Circle System Utilized Preoxygenation: Pre-oxygenation with 100% oxygen Induction Type: IV induction Ventilation: Mask ventilation without difficulty LMA: LMA inserted LMA Size: 5.0 Number of attempts: 1 Airway Equipment and Method: Bite block Placement Confirmation: positive ETCO2 Tube secured with: Tape Dental Injury: Teeth and Oropharynx as per pre-operative assessment

## 2023-06-24 NOTE — Discharge Instructions (Signed)

## 2023-06-24 NOTE — Anesthesia Procedure Notes (Signed)
 Anesthesia Regional Block: Supraclavicular block   Pre-Anesthetic Checklist: , timeout performed,  Correct Patient, Correct Site, Correct Laterality,  Correct Procedure, Correct Position, site marked,  Risks and benefits discussed,  Surgical consent,  Pre-op  evaluation,  At surgeon's request and post-op pain management  Laterality: Right  Prep: chloraprep       Needles:  Injection technique: Single-shot  Needle Type: Echogenic Stimulator Needle     Needle Length: 9cm  Needle Gauge: 21     Additional Needles:   Procedures:,,,, ultrasound used (permanent image in chart),,    Narrative:  Start time: 06/24/2023 12:15 PM End time: 06/24/2023 12:20 PM Injection made incrementally with aspirations every 5 mL.  Performed by: Personally  Anesthesiologist: Willian Harrow, MD  Additional Notes: Discussed risks and benefits of the nerve block in detail, including but not limited vascular injury, permanent nerve damage and infection.   Patient tolerated the procedure well. Local anesthetic introduced in an incremental fashion under minimal resistance after negative aspirations. No paresthesias were elicited. After completion of the procedure, no acute issues were identified and patient continued to be monitored by RN.

## 2023-06-24 NOTE — Transfer of Care (Signed)
 Immediate Anesthesia Transfer of Care Note  Patient: Samuel Burns  Procedure(s) Performed: TRANSPOSITION, VEIN, BASILIC (Right)  Patient Location: PACU  Anesthesia Type:General  Level of Consciousness: awake, alert , and oriented  Airway & Oxygen Therapy: Patient Spontanous Breathing and Patient connected to nasal cannula oxygen  Post-op Assessment: Report given to RN and Post -op Vital signs reviewed and stable  Post vital signs: Reviewed and stable  Last Vitals:  Vitals Value Taken Time  BP 110/36 06/24/23 1445  Temp 36.6 C 06/24/23 1445  Pulse 71 06/24/23 1446  Resp 15 06/24/23 1446  SpO2 100 % 06/24/23 1446  Vitals shown include unfiled device data.  Last Pain:  Vitals:   06/24/23 1055  TempSrc:   PainSc: 0-No pain         Complications: No notable events documented.

## 2023-06-25 ENCOUNTER — Encounter (HOSPITAL_COMMUNITY): Payer: Self-pay | Admitting: Vascular Surgery

## 2023-06-25 DIAGNOSIS — Z992 Dependence on renal dialysis: Secondary | ICD-10-CM | POA: Diagnosis not present

## 2023-06-25 DIAGNOSIS — Z4800 Encounter for change or removal of nonsurgical wound dressing: Secondary | ICD-10-CM | POA: Diagnosis not present

## 2023-06-25 DIAGNOSIS — N2581 Secondary hyperparathyroidism of renal origin: Secondary | ICD-10-CM | POA: Diagnosis not present

## 2023-06-25 DIAGNOSIS — N186 End stage renal disease: Secondary | ICD-10-CM | POA: Diagnosis not present

## 2023-06-25 DIAGNOSIS — D689 Coagulation defect, unspecified: Secondary | ICD-10-CM | POA: Diagnosis not present

## 2023-06-25 LAB — POCT I-STAT, CHEM 8
BUN: 62 mg/dL — ABNORMAL HIGH (ref 6–20)
Calcium, Ion: 0.89 mmol/L — CL (ref 1.15–1.40)
Chloride: 99 mmol/L (ref 98–111)
Creatinine, Ser: 11.8 mg/dL — ABNORMAL HIGH (ref 0.61–1.24)
Glucose, Bld: 78 mg/dL (ref 70–99)
HCT: 43 % (ref 39.0–52.0)
Hemoglobin: 14.6 g/dL (ref 13.0–17.0)
Potassium: 5 mmol/L (ref 3.5–5.1)
Sodium: 135 mmol/L (ref 135–145)
TCO2: 27 mmol/L (ref 22–32)

## 2023-06-28 DIAGNOSIS — Z4800 Encounter for change or removal of nonsurgical wound dressing: Secondary | ICD-10-CM | POA: Diagnosis not present

## 2023-06-28 DIAGNOSIS — D689 Coagulation defect, unspecified: Secondary | ICD-10-CM | POA: Diagnosis not present

## 2023-06-28 DIAGNOSIS — N186 End stage renal disease: Secondary | ICD-10-CM | POA: Diagnosis not present

## 2023-06-28 DIAGNOSIS — Z992 Dependence on renal dialysis: Secondary | ICD-10-CM | POA: Diagnosis not present

## 2023-06-28 DIAGNOSIS — N2581 Secondary hyperparathyroidism of renal origin: Secondary | ICD-10-CM | POA: Diagnosis not present

## 2023-06-30 DIAGNOSIS — N2581 Secondary hyperparathyroidism of renal origin: Secondary | ICD-10-CM | POA: Diagnosis not present

## 2023-06-30 DIAGNOSIS — Z992 Dependence on renal dialysis: Secondary | ICD-10-CM | POA: Diagnosis not present

## 2023-06-30 DIAGNOSIS — N186 End stage renal disease: Secondary | ICD-10-CM | POA: Diagnosis not present

## 2023-06-30 DIAGNOSIS — Z4800 Encounter for change or removal of nonsurgical wound dressing: Secondary | ICD-10-CM | POA: Diagnosis not present

## 2023-06-30 DIAGNOSIS — D689 Coagulation defect, unspecified: Secondary | ICD-10-CM | POA: Diagnosis not present

## 2023-07-02 DIAGNOSIS — Z992 Dependence on renal dialysis: Secondary | ICD-10-CM | POA: Diagnosis not present

## 2023-07-02 DIAGNOSIS — N2581 Secondary hyperparathyroidism of renal origin: Secondary | ICD-10-CM | POA: Diagnosis not present

## 2023-07-02 DIAGNOSIS — Z4800 Encounter for change or removal of nonsurgical wound dressing: Secondary | ICD-10-CM | POA: Diagnosis not present

## 2023-07-02 DIAGNOSIS — N186 End stage renal disease: Secondary | ICD-10-CM | POA: Diagnosis not present

## 2023-07-02 DIAGNOSIS — D689 Coagulation defect, unspecified: Secondary | ICD-10-CM | POA: Diagnosis not present

## 2023-07-05 DIAGNOSIS — Z4800 Encounter for change or removal of nonsurgical wound dressing: Secondary | ICD-10-CM | POA: Diagnosis not present

## 2023-07-05 DIAGNOSIS — N2581 Secondary hyperparathyroidism of renal origin: Secondary | ICD-10-CM | POA: Diagnosis not present

## 2023-07-05 DIAGNOSIS — N186 End stage renal disease: Secondary | ICD-10-CM | POA: Diagnosis not present

## 2023-07-05 DIAGNOSIS — Z992 Dependence on renal dialysis: Secondary | ICD-10-CM | POA: Diagnosis not present

## 2023-07-05 DIAGNOSIS — D689 Coagulation defect, unspecified: Secondary | ICD-10-CM | POA: Diagnosis not present

## 2023-07-07 DIAGNOSIS — Z992 Dependence on renal dialysis: Secondary | ICD-10-CM | POA: Diagnosis not present

## 2023-07-07 DIAGNOSIS — D689 Coagulation defect, unspecified: Secondary | ICD-10-CM | POA: Diagnosis not present

## 2023-07-07 DIAGNOSIS — N2581 Secondary hyperparathyroidism of renal origin: Secondary | ICD-10-CM | POA: Diagnosis not present

## 2023-07-07 DIAGNOSIS — N186 End stage renal disease: Secondary | ICD-10-CM | POA: Diagnosis not present

## 2023-07-07 DIAGNOSIS — Z4800 Encounter for change or removal of nonsurgical wound dressing: Secondary | ICD-10-CM | POA: Diagnosis not present

## 2023-07-09 DIAGNOSIS — N2581 Secondary hyperparathyroidism of renal origin: Secondary | ICD-10-CM | POA: Diagnosis not present

## 2023-07-09 DIAGNOSIS — N186 End stage renal disease: Secondary | ICD-10-CM | POA: Diagnosis not present

## 2023-07-09 DIAGNOSIS — D689 Coagulation defect, unspecified: Secondary | ICD-10-CM | POA: Diagnosis not present

## 2023-07-09 DIAGNOSIS — Z4800 Encounter for change or removal of nonsurgical wound dressing: Secondary | ICD-10-CM | POA: Diagnosis not present

## 2023-07-09 DIAGNOSIS — Z992 Dependence on renal dialysis: Secondary | ICD-10-CM | POA: Diagnosis not present

## 2023-07-12 DIAGNOSIS — D689 Coagulation defect, unspecified: Secondary | ICD-10-CM | POA: Diagnosis not present

## 2023-07-12 DIAGNOSIS — N2581 Secondary hyperparathyroidism of renal origin: Secondary | ICD-10-CM | POA: Diagnosis not present

## 2023-07-12 DIAGNOSIS — Z992 Dependence on renal dialysis: Secondary | ICD-10-CM | POA: Diagnosis not present

## 2023-07-12 DIAGNOSIS — Z4800 Encounter for change or removal of nonsurgical wound dressing: Secondary | ICD-10-CM | POA: Diagnosis not present

## 2023-07-12 DIAGNOSIS — N186 End stage renal disease: Secondary | ICD-10-CM | POA: Diagnosis not present

## 2023-07-14 ENCOUNTER — Other Ambulatory Visit: Payer: Self-pay

## 2023-07-14 ENCOUNTER — Encounter: Payer: Self-pay | Admitting: Pharmacist

## 2023-07-14 ENCOUNTER — Other Ambulatory Visit: Payer: Self-pay | Admitting: Internal Medicine

## 2023-07-14 DIAGNOSIS — N186 End stage renal disease: Secondary | ICD-10-CM | POA: Diagnosis not present

## 2023-07-14 DIAGNOSIS — N2581 Secondary hyperparathyroidism of renal origin: Secondary | ICD-10-CM | POA: Diagnosis not present

## 2023-07-14 DIAGNOSIS — Z992 Dependence on renal dialysis: Secondary | ICD-10-CM | POA: Diagnosis not present

## 2023-07-14 DIAGNOSIS — Z4800 Encounter for change or removal of nonsurgical wound dressing: Secondary | ICD-10-CM | POA: Diagnosis not present

## 2023-07-14 DIAGNOSIS — D689 Coagulation defect, unspecified: Secondary | ICD-10-CM | POA: Diagnosis not present

## 2023-07-16 DIAGNOSIS — N186 End stage renal disease: Secondary | ICD-10-CM | POA: Diagnosis not present

## 2023-07-16 DIAGNOSIS — Z4800 Encounter for change or removal of nonsurgical wound dressing: Secondary | ICD-10-CM | POA: Diagnosis not present

## 2023-07-16 DIAGNOSIS — Z992 Dependence on renal dialysis: Secondary | ICD-10-CM | POA: Diagnosis not present

## 2023-07-16 DIAGNOSIS — N2581 Secondary hyperparathyroidism of renal origin: Secondary | ICD-10-CM | POA: Diagnosis not present

## 2023-07-16 DIAGNOSIS — D689 Coagulation defect, unspecified: Secondary | ICD-10-CM | POA: Diagnosis not present

## 2023-07-17 DIAGNOSIS — I129 Hypertensive chronic kidney disease with stage 1 through stage 4 chronic kidney disease, or unspecified chronic kidney disease: Secondary | ICD-10-CM | POA: Diagnosis not present

## 2023-07-17 DIAGNOSIS — Z992 Dependence on renal dialysis: Secondary | ICD-10-CM | POA: Diagnosis not present

## 2023-07-17 DIAGNOSIS — N186 End stage renal disease: Secondary | ICD-10-CM | POA: Diagnosis not present

## 2023-07-19 DIAGNOSIS — D689 Coagulation defect, unspecified: Secondary | ICD-10-CM | POA: Diagnosis not present

## 2023-07-19 DIAGNOSIS — N2581 Secondary hyperparathyroidism of renal origin: Secondary | ICD-10-CM | POA: Diagnosis not present

## 2023-07-19 DIAGNOSIS — N186 End stage renal disease: Secondary | ICD-10-CM | POA: Diagnosis not present

## 2023-07-19 DIAGNOSIS — Z4800 Encounter for change or removal of nonsurgical wound dressing: Secondary | ICD-10-CM | POA: Diagnosis not present

## 2023-07-19 DIAGNOSIS — D509 Iron deficiency anemia, unspecified: Secondary | ICD-10-CM | POA: Diagnosis not present

## 2023-07-19 DIAGNOSIS — D631 Anemia in chronic kidney disease: Secondary | ICD-10-CM | POA: Diagnosis not present

## 2023-07-19 DIAGNOSIS — Z992 Dependence on renal dialysis: Secondary | ICD-10-CM | POA: Diagnosis not present

## 2023-07-21 DIAGNOSIS — N186 End stage renal disease: Secondary | ICD-10-CM | POA: Diagnosis not present

## 2023-07-21 DIAGNOSIS — D631 Anemia in chronic kidney disease: Secondary | ICD-10-CM | POA: Diagnosis not present

## 2023-07-21 DIAGNOSIS — N2581 Secondary hyperparathyroidism of renal origin: Secondary | ICD-10-CM | POA: Diagnosis not present

## 2023-07-21 DIAGNOSIS — D509 Iron deficiency anemia, unspecified: Secondary | ICD-10-CM | POA: Diagnosis not present

## 2023-07-21 DIAGNOSIS — D689 Coagulation defect, unspecified: Secondary | ICD-10-CM | POA: Diagnosis not present

## 2023-07-21 DIAGNOSIS — Z4800 Encounter for change or removal of nonsurgical wound dressing: Secondary | ICD-10-CM | POA: Diagnosis not present

## 2023-07-21 DIAGNOSIS — Z992 Dependence on renal dialysis: Secondary | ICD-10-CM | POA: Diagnosis not present

## 2023-07-23 ENCOUNTER — Other Ambulatory Visit: Payer: Self-pay | Admitting: *Deleted

## 2023-07-23 DIAGNOSIS — D631 Anemia in chronic kidney disease: Secondary | ICD-10-CM | POA: Diagnosis not present

## 2023-07-23 DIAGNOSIS — N2581 Secondary hyperparathyroidism of renal origin: Secondary | ICD-10-CM | POA: Diagnosis not present

## 2023-07-23 DIAGNOSIS — N186 End stage renal disease: Secondary | ICD-10-CM

## 2023-07-23 DIAGNOSIS — Z992 Dependence on renal dialysis: Secondary | ICD-10-CM | POA: Diagnosis not present

## 2023-07-23 DIAGNOSIS — Z4800 Encounter for change or removal of nonsurgical wound dressing: Secondary | ICD-10-CM | POA: Diagnosis not present

## 2023-07-23 DIAGNOSIS — D509 Iron deficiency anemia, unspecified: Secondary | ICD-10-CM | POA: Diagnosis not present

## 2023-07-23 DIAGNOSIS — D689 Coagulation defect, unspecified: Secondary | ICD-10-CM | POA: Diagnosis not present

## 2023-07-26 ENCOUNTER — Other Ambulatory Visit: Payer: Self-pay

## 2023-07-26 DIAGNOSIS — Z4800 Encounter for change or removal of nonsurgical wound dressing: Secondary | ICD-10-CM | POA: Diagnosis not present

## 2023-07-26 DIAGNOSIS — N2581 Secondary hyperparathyroidism of renal origin: Secondary | ICD-10-CM | POA: Diagnosis not present

## 2023-07-26 DIAGNOSIS — D631 Anemia in chronic kidney disease: Secondary | ICD-10-CM | POA: Diagnosis not present

## 2023-07-26 DIAGNOSIS — N186 End stage renal disease: Secondary | ICD-10-CM | POA: Diagnosis not present

## 2023-07-26 DIAGNOSIS — D689 Coagulation defect, unspecified: Secondary | ICD-10-CM | POA: Diagnosis not present

## 2023-07-26 DIAGNOSIS — Z992 Dependence on renal dialysis: Secondary | ICD-10-CM | POA: Diagnosis not present

## 2023-07-26 DIAGNOSIS — D509 Iron deficiency anemia, unspecified: Secondary | ICD-10-CM | POA: Diagnosis not present

## 2023-07-27 ENCOUNTER — Other Ambulatory Visit: Payer: Self-pay

## 2023-07-27 ENCOUNTER — Other Ambulatory Visit (HOSPITAL_COMMUNITY): Payer: Self-pay

## 2023-07-28 DIAGNOSIS — D509 Iron deficiency anemia, unspecified: Secondary | ICD-10-CM | POA: Diagnosis not present

## 2023-07-28 DIAGNOSIS — Z992 Dependence on renal dialysis: Secondary | ICD-10-CM | POA: Diagnosis not present

## 2023-07-28 DIAGNOSIS — Z4800 Encounter for change or removal of nonsurgical wound dressing: Secondary | ICD-10-CM | POA: Diagnosis not present

## 2023-07-28 DIAGNOSIS — N186 End stage renal disease: Secondary | ICD-10-CM | POA: Diagnosis not present

## 2023-07-28 DIAGNOSIS — D631 Anemia in chronic kidney disease: Secondary | ICD-10-CM | POA: Diagnosis not present

## 2023-07-28 DIAGNOSIS — D689 Coagulation defect, unspecified: Secondary | ICD-10-CM | POA: Diagnosis not present

## 2023-07-28 DIAGNOSIS — N2581 Secondary hyperparathyroidism of renal origin: Secondary | ICD-10-CM | POA: Diagnosis not present

## 2023-07-29 ENCOUNTER — Encounter: Payer: Self-pay | Admitting: Internal Medicine

## 2023-07-29 NOTE — Telephone Encounter (Signed)
 Please forward to PCP

## 2023-07-30 DIAGNOSIS — Z992 Dependence on renal dialysis: Secondary | ICD-10-CM | POA: Diagnosis not present

## 2023-07-30 DIAGNOSIS — D689 Coagulation defect, unspecified: Secondary | ICD-10-CM | POA: Diagnosis not present

## 2023-07-30 DIAGNOSIS — N2581 Secondary hyperparathyroidism of renal origin: Secondary | ICD-10-CM | POA: Diagnosis not present

## 2023-07-30 DIAGNOSIS — N186 End stage renal disease: Secondary | ICD-10-CM | POA: Diagnosis not present

## 2023-07-30 DIAGNOSIS — D631 Anemia in chronic kidney disease: Secondary | ICD-10-CM | POA: Diagnosis not present

## 2023-07-30 DIAGNOSIS — Z4800 Encounter for change or removal of nonsurgical wound dressing: Secondary | ICD-10-CM | POA: Diagnosis not present

## 2023-07-30 DIAGNOSIS — D509 Iron deficiency anemia, unspecified: Secondary | ICD-10-CM | POA: Diagnosis not present

## 2023-08-02 ENCOUNTER — Other Ambulatory Visit: Payer: Self-pay | Admitting: Internal Medicine

## 2023-08-02 DIAGNOSIS — N186 End stage renal disease: Secondary | ICD-10-CM | POA: Diagnosis not present

## 2023-08-02 DIAGNOSIS — Z4800 Encounter for change or removal of nonsurgical wound dressing: Secondary | ICD-10-CM | POA: Diagnosis not present

## 2023-08-02 DIAGNOSIS — D509 Iron deficiency anemia, unspecified: Secondary | ICD-10-CM | POA: Diagnosis not present

## 2023-08-02 DIAGNOSIS — D689 Coagulation defect, unspecified: Secondary | ICD-10-CM | POA: Diagnosis not present

## 2023-08-02 DIAGNOSIS — D631 Anemia in chronic kidney disease: Secondary | ICD-10-CM | POA: Diagnosis not present

## 2023-08-02 DIAGNOSIS — N2581 Secondary hyperparathyroidism of renal origin: Secondary | ICD-10-CM | POA: Diagnosis not present

## 2023-08-02 DIAGNOSIS — Z992 Dependence on renal dialysis: Secondary | ICD-10-CM | POA: Diagnosis not present

## 2023-08-03 ENCOUNTER — Other Ambulatory Visit (HOSPITAL_COMMUNITY): Payer: Self-pay

## 2023-08-03 ENCOUNTER — Other Ambulatory Visit: Payer: Self-pay

## 2023-08-03 DIAGNOSIS — D509 Iron deficiency anemia, unspecified: Secondary | ICD-10-CM | POA: Diagnosis not present

## 2023-08-03 DIAGNOSIS — N2581 Secondary hyperparathyroidism of renal origin: Secondary | ICD-10-CM | POA: Diagnosis not present

## 2023-08-03 DIAGNOSIS — D631 Anemia in chronic kidney disease: Secondary | ICD-10-CM | POA: Diagnosis not present

## 2023-08-03 DIAGNOSIS — D689 Coagulation defect, unspecified: Secondary | ICD-10-CM | POA: Diagnosis not present

## 2023-08-03 DIAGNOSIS — Z4800 Encounter for change or removal of nonsurgical wound dressing: Secondary | ICD-10-CM | POA: Diagnosis not present

## 2023-08-03 DIAGNOSIS — N186 End stage renal disease: Secondary | ICD-10-CM | POA: Diagnosis not present

## 2023-08-03 DIAGNOSIS — Z992 Dependence on renal dialysis: Secondary | ICD-10-CM | POA: Diagnosis not present

## 2023-08-03 MED ORDER — CLONIDINE HCL 0.1 MG PO TABS
0.1000 mg | ORAL_TABLET | Freq: Two times a day (BID) | ORAL | 0 refills | Status: DC
  Filled 2023-08-03: qty 60, 30d supply, fill #0

## 2023-08-03 MED ORDER — HYDROXYZINE PAMOATE 25 MG PO CAPS
25.0000 mg | ORAL_CAPSULE | Freq: Two times a day (BID) | ORAL | 0 refills | Status: DC
  Filled 2023-08-03 – 2023-09-05 (×2): qty 60, 30d supply, fill #0

## 2023-08-04 ENCOUNTER — Other Ambulatory Visit (HOSPITAL_COMMUNITY): Payer: Self-pay

## 2023-08-04 ENCOUNTER — Other Ambulatory Visit: Payer: Self-pay

## 2023-08-04 DIAGNOSIS — Z4800 Encounter for change or removal of nonsurgical wound dressing: Secondary | ICD-10-CM | POA: Diagnosis not present

## 2023-08-04 DIAGNOSIS — D689 Coagulation defect, unspecified: Secondary | ICD-10-CM | POA: Diagnosis not present

## 2023-08-04 DIAGNOSIS — Z992 Dependence on renal dialysis: Secondary | ICD-10-CM | POA: Diagnosis not present

## 2023-08-04 DIAGNOSIS — N186 End stage renal disease: Secondary | ICD-10-CM | POA: Diagnosis not present

## 2023-08-04 DIAGNOSIS — D631 Anemia in chronic kidney disease: Secondary | ICD-10-CM | POA: Diagnosis not present

## 2023-08-04 DIAGNOSIS — N2581 Secondary hyperparathyroidism of renal origin: Secondary | ICD-10-CM | POA: Diagnosis not present

## 2023-08-04 DIAGNOSIS — D509 Iron deficiency anemia, unspecified: Secondary | ICD-10-CM | POA: Diagnosis not present

## 2023-08-05 ENCOUNTER — Other Ambulatory Visit: Payer: Self-pay

## 2023-08-06 DIAGNOSIS — D689 Coagulation defect, unspecified: Secondary | ICD-10-CM | POA: Diagnosis not present

## 2023-08-06 DIAGNOSIS — N2581 Secondary hyperparathyroidism of renal origin: Secondary | ICD-10-CM | POA: Diagnosis not present

## 2023-08-06 DIAGNOSIS — N186 End stage renal disease: Secondary | ICD-10-CM | POA: Diagnosis not present

## 2023-08-06 DIAGNOSIS — D509 Iron deficiency anemia, unspecified: Secondary | ICD-10-CM | POA: Diagnosis not present

## 2023-08-06 DIAGNOSIS — D631 Anemia in chronic kidney disease: Secondary | ICD-10-CM | POA: Diagnosis not present

## 2023-08-06 DIAGNOSIS — Z992 Dependence on renal dialysis: Secondary | ICD-10-CM | POA: Diagnosis not present

## 2023-08-06 DIAGNOSIS — Z4800 Encounter for change or removal of nonsurgical wound dressing: Secondary | ICD-10-CM | POA: Diagnosis not present

## 2023-08-07 ENCOUNTER — Other Ambulatory Visit (HOSPITAL_COMMUNITY): Payer: Self-pay

## 2023-08-09 ENCOUNTER — Other Ambulatory Visit: Payer: Self-pay

## 2023-08-09 DIAGNOSIS — N2581 Secondary hyperparathyroidism of renal origin: Secondary | ICD-10-CM | POA: Diagnosis not present

## 2023-08-09 DIAGNOSIS — D689 Coagulation defect, unspecified: Secondary | ICD-10-CM | POA: Diagnosis not present

## 2023-08-09 DIAGNOSIS — Z4800 Encounter for change or removal of nonsurgical wound dressing: Secondary | ICD-10-CM | POA: Diagnosis not present

## 2023-08-09 DIAGNOSIS — Z992 Dependence on renal dialysis: Secondary | ICD-10-CM | POA: Diagnosis not present

## 2023-08-09 DIAGNOSIS — D509 Iron deficiency anemia, unspecified: Secondary | ICD-10-CM | POA: Diagnosis not present

## 2023-08-09 DIAGNOSIS — D631 Anemia in chronic kidney disease: Secondary | ICD-10-CM | POA: Diagnosis not present

## 2023-08-09 DIAGNOSIS — N186 End stage renal disease: Secondary | ICD-10-CM | POA: Diagnosis not present

## 2023-08-09 NOTE — Progress Notes (Unsigned)
    Postoperative Access Visit   History of Present Illness   Samuel Burns is a 38 y.o. year old male who presents for postoperative follow-up for: right second stage basilic vein transposition (Date: 06/24/23).  The patient's wounds are healed.  The patient notes *** steal symptoms.  The patient is *** able to complete their activities of daily living.  He dialyzes on a *** schedule at the *** kidney center.   Physical Examination  There were no vitals filed for this visit. There is no height or weight on file to calculate BMI.  right arm Incisions are *** healed, *** radial pulse, hand grip is ***/5, sensation in digits is *** intact, ***palpable thrill, bruit can *** be auscultated     Medical Decision Making   Samuel Burns is a 38 y.o. year old male who presents s/p right second stage basilic vein transposition  Patent *** without signs or symptoms of steal syndrome The patient's access will be ready for use *** ***The patient's tunneled dialysis catheter can be removed when Nephrology is comfortable with the performance of the *** The patient may follow up on a prn basis   Samuel Sender PA-C [facility] [place] Office: [phone]  Clinic MD: ***

## 2023-08-09 NOTE — H&P (View-Only) (Signed)
    Postoperative Access Visit   History of Present Illness   Samuel Burns is a 38 y.o. year old male who presents for postoperative follow-up for: right second stage basilic vein transposition (Date: 06/24/23).  The patient's wounds are healed.  The patient notes *** steal symptoms.  The patient is *** able to complete their activities of daily living.  He dialyzes on a *** schedule at the *** kidney center.   Physical Examination  There were no vitals filed for this visit. There is no height or weight on file to calculate BMI.  right arm Incisions are *** healed, *** radial pulse, hand grip is ***/5, sensation in digits is *** intact, ***palpable thrill, bruit can *** be auscultated     Medical Decision Making   Samuel Burns is a 38 y.o. year old male who presents s/p right second stage basilic vein transposition  Patent *** without signs or symptoms of steal syndrome The patient's access will be ready for use *** ***The patient's tunneled dialysis catheter can be removed when Nephrology is comfortable with the performance of the *** The patient may follow up on a prn basis   Donnice Sender PA-C [facility] [place] Office: [phone]  Clinic MD: ***

## 2023-08-10 ENCOUNTER — Ambulatory Visit (INDEPENDENT_AMBULATORY_CARE_PROVIDER_SITE_OTHER)

## 2023-08-10 ENCOUNTER — Ambulatory Visit (HOSPITAL_COMMUNITY)
Admission: RE | Admit: 2023-08-10 | Discharge: 2023-08-10 | Disposition: A | Discharge status: Home / Self Care | Source: Ambulatory Visit | Attending: Vascular Surgery | Admitting: Vascular Surgery

## 2023-08-10 VITALS — BP 78/48 | HR 83 | Temp 97.7°F | Ht 68.0 in | Wt 181.7 lb

## 2023-08-10 DIAGNOSIS — N186 End stage renal disease: Secondary | ICD-10-CM | POA: Diagnosis not present

## 2023-08-10 DIAGNOSIS — Z992 Dependence on renal dialysis: Secondary | ICD-10-CM

## 2023-08-11 DIAGNOSIS — D509 Iron deficiency anemia, unspecified: Secondary | ICD-10-CM | POA: Diagnosis not present

## 2023-08-11 DIAGNOSIS — Z992 Dependence on renal dialysis: Secondary | ICD-10-CM | POA: Diagnosis not present

## 2023-08-11 DIAGNOSIS — D631 Anemia in chronic kidney disease: Secondary | ICD-10-CM | POA: Diagnosis not present

## 2023-08-11 DIAGNOSIS — Z4800 Encounter for change or removal of nonsurgical wound dressing: Secondary | ICD-10-CM | POA: Diagnosis not present

## 2023-08-11 DIAGNOSIS — N186 End stage renal disease: Secondary | ICD-10-CM | POA: Diagnosis not present

## 2023-08-11 DIAGNOSIS — N2581 Secondary hyperparathyroidism of renal origin: Secondary | ICD-10-CM | POA: Diagnosis not present

## 2023-08-11 DIAGNOSIS — D689 Coagulation defect, unspecified: Secondary | ICD-10-CM | POA: Diagnosis not present

## 2023-08-13 DIAGNOSIS — D631 Anemia in chronic kidney disease: Secondary | ICD-10-CM | POA: Diagnosis not present

## 2023-08-13 DIAGNOSIS — D689 Coagulation defect, unspecified: Secondary | ICD-10-CM | POA: Diagnosis not present

## 2023-08-13 DIAGNOSIS — Z992 Dependence on renal dialysis: Secondary | ICD-10-CM | POA: Diagnosis not present

## 2023-08-13 DIAGNOSIS — D509 Iron deficiency anemia, unspecified: Secondary | ICD-10-CM | POA: Diagnosis not present

## 2023-08-13 DIAGNOSIS — Z4800 Encounter for change or removal of nonsurgical wound dressing: Secondary | ICD-10-CM | POA: Diagnosis not present

## 2023-08-13 DIAGNOSIS — N186 End stage renal disease: Secondary | ICD-10-CM | POA: Diagnosis not present

## 2023-08-13 DIAGNOSIS — N2581 Secondary hyperparathyroidism of renal origin: Secondary | ICD-10-CM | POA: Diagnosis not present

## 2023-08-16 DIAGNOSIS — N186 End stage renal disease: Secondary | ICD-10-CM | POA: Diagnosis not present

## 2023-08-16 DIAGNOSIS — Z992 Dependence on renal dialysis: Secondary | ICD-10-CM | POA: Diagnosis not present

## 2023-08-16 DIAGNOSIS — Z4800 Encounter for change or removal of nonsurgical wound dressing: Secondary | ICD-10-CM | POA: Diagnosis not present

## 2023-08-16 DIAGNOSIS — D509 Iron deficiency anemia, unspecified: Secondary | ICD-10-CM | POA: Diagnosis not present

## 2023-08-16 DIAGNOSIS — N2581 Secondary hyperparathyroidism of renal origin: Secondary | ICD-10-CM | POA: Diagnosis not present

## 2023-08-16 DIAGNOSIS — I129 Hypertensive chronic kidney disease with stage 1 through stage 4 chronic kidney disease, or unspecified chronic kidney disease: Secondary | ICD-10-CM | POA: Diagnosis not present

## 2023-08-16 DIAGNOSIS — D631 Anemia in chronic kidney disease: Secondary | ICD-10-CM | POA: Diagnosis not present

## 2023-08-16 DIAGNOSIS — D689 Coagulation defect, unspecified: Secondary | ICD-10-CM | POA: Diagnosis not present

## 2023-08-18 DIAGNOSIS — Z992 Dependence on renal dialysis: Secondary | ICD-10-CM | POA: Diagnosis not present

## 2023-08-18 DIAGNOSIS — D631 Anemia in chronic kidney disease: Secondary | ICD-10-CM | POA: Diagnosis not present

## 2023-08-18 DIAGNOSIS — Z4800 Encounter for change or removal of nonsurgical wound dressing: Secondary | ICD-10-CM | POA: Diagnosis not present

## 2023-08-18 DIAGNOSIS — D689 Coagulation defect, unspecified: Secondary | ICD-10-CM | POA: Diagnosis not present

## 2023-08-18 DIAGNOSIS — N2581 Secondary hyperparathyroidism of renal origin: Secondary | ICD-10-CM | POA: Diagnosis not present

## 2023-08-20 DIAGNOSIS — N186 End stage renal disease: Secondary | ICD-10-CM | POA: Diagnosis not present

## 2023-08-23 DIAGNOSIS — D689 Coagulation defect, unspecified: Secondary | ICD-10-CM | POA: Diagnosis not present

## 2023-08-23 DIAGNOSIS — N2581 Secondary hyperparathyroidism of renal origin: Secondary | ICD-10-CM | POA: Diagnosis not present

## 2023-08-23 DIAGNOSIS — D631 Anemia in chronic kidney disease: Secondary | ICD-10-CM | POA: Diagnosis not present

## 2023-08-23 DIAGNOSIS — N186 End stage renal disease: Secondary | ICD-10-CM | POA: Diagnosis not present

## 2023-08-23 DIAGNOSIS — Z992 Dependence on renal dialysis: Secondary | ICD-10-CM | POA: Diagnosis not present

## 2023-08-25 DIAGNOSIS — Z992 Dependence on renal dialysis: Secondary | ICD-10-CM | POA: Diagnosis not present

## 2023-08-25 DIAGNOSIS — Z4800 Encounter for change or removal of nonsurgical wound dressing: Secondary | ICD-10-CM | POA: Diagnosis not present

## 2023-08-25 DIAGNOSIS — D689 Coagulation defect, unspecified: Secondary | ICD-10-CM | POA: Diagnosis not present

## 2023-08-25 DIAGNOSIS — D631 Anemia in chronic kidney disease: Secondary | ICD-10-CM | POA: Diagnosis not present

## 2023-08-25 DIAGNOSIS — N186 End stage renal disease: Secondary | ICD-10-CM | POA: Diagnosis not present

## 2023-08-25 DIAGNOSIS — N2581 Secondary hyperparathyroidism of renal origin: Secondary | ICD-10-CM | POA: Diagnosis not present

## 2023-08-26 ENCOUNTER — Encounter (HOSPITAL_COMMUNITY)
Admission: RE | Disposition: A | Discharge status: Home / Self Care | Payer: Self-pay | Source: Home / Self Care | Attending: Vascular Surgery

## 2023-08-26 ENCOUNTER — Ambulatory Visit (HOSPITAL_COMMUNITY)
Admission: RE | Admit: 2023-08-26 | Discharge: 2023-08-26 | Disposition: A | Discharge status: Home / Self Care | Attending: Vascular Surgery | Admitting: Vascular Surgery

## 2023-08-26 ENCOUNTER — Other Ambulatory Visit: Payer: Self-pay

## 2023-08-26 DIAGNOSIS — Z992 Dependence on renal dialysis: Secondary | ICD-10-CM | POA: Insufficient documentation

## 2023-08-26 DIAGNOSIS — Y832 Surgical operation with anastomosis, bypass or graft as the cause of abnormal reaction of the patient, or of later complication, without mention of misadventure at the time of the procedure: Secondary | ICD-10-CM | POA: Diagnosis not present

## 2023-08-26 DIAGNOSIS — T82858A Stenosis of vascular prosthetic devices, implants and grafts, initial encounter: Secondary | ICD-10-CM | POA: Diagnosis not present

## 2023-08-26 DIAGNOSIS — I871 Compression of vein: Secondary | ICD-10-CM | POA: Diagnosis not present

## 2023-08-26 DIAGNOSIS — N186 End stage renal disease: Secondary | ICD-10-CM

## 2023-08-26 HISTORY — PX: A/V SHUNT INTERVENTION: CATH118220

## 2023-08-26 HISTORY — PX: VENOUS ANGIOPLASTY: CATH118376

## 2023-08-26 SURGERY — A/V SHUNT INTERVENTION
Anesthesia: LOCAL | Laterality: Right

## 2023-08-26 MED ORDER — IODIXANOL 320 MG/ML IV SOLN
INTRAVENOUS | Status: DC | PRN
Start: 2023-08-26 — End: 2023-08-26
  Administered 2023-08-26: 30 mL via INTRAVENOUS

## 2023-08-26 MED ORDER — LIDOCAINE HCL (PF) 1 % IJ SOLN
INTRAMUSCULAR | Status: DC | PRN
Start: 2023-08-26 — End: 2023-08-26
  Administered 2023-08-26: 2 mL via SUBCUTANEOUS

## 2023-08-26 MED ORDER — HEPARIN (PORCINE) IN NACL 1000-0.9 UT/500ML-% IV SOLN
INTRAVENOUS | Status: DC | PRN
  Administered 2023-08-26: 500 mL

## 2023-08-26 MED ORDER — LIDOCAINE HCL (PF) 1 % IJ SOLN
INTRAMUSCULAR | Status: AC
  Filled 2023-08-26: qty 30

## 2023-08-26 SURGICAL SUPPLY — 10 items
BALLOON MUSTANG 5.0X40 75 (BALLOONS) IMPLANT
BALLOON MUSTANG 7.0X40 75 (BALLOONS) IMPLANT
KIT ENCORE 26 ADVANTAGE (KITS) IMPLANT
KIT MICROPUNCTURE NIT STIFF (SHEATH) IMPLANT
SHEATH PINNACLE R/O II 6F 4CM (SHEATH) IMPLANT
SHEATH PROBE COVER 6X72 (BAG) IMPLANT
STOPCOCK MORSE 400PSI 3WAY (MISCELLANEOUS) IMPLANT
TRAY PV CATH (CUSTOM PROCEDURE TRAY) ×2 IMPLANT
TUBING CIL FLEX 10 FLL-RA (TUBING) IMPLANT
WIRE BENTSON .035X145CM (WIRE) IMPLANT

## 2023-08-26 NOTE — Op Note (Signed)
    Patient name: Samuel Burns MRN: [id number] DOB: 1985-07-06 Sex: male  08/26/2023 Pre-operative Diagnosis: ESRD Post-operative diagnosis:  Same Surgeon:  Malvina New Procedure Performed:  1.  Ultrasound-guided access, right basilic vein  2.  Fistulogram  3.  Peripheral venoplasty, right basilic vein    Indications: This is a 38 year old gentleman with a recently created basilic vein fistula on the right.  Ultrasound identified an area of stenosis.  He is here for further evaluation.  Procedure:  The patient was identified in the holding area and taken to room 8.  The patient was then placed supine on the table and prepped and draped in the usual sterile fashion.  A time out was called. Ultrasound was used to evaluate the fistula.  The vein was patent and compressible.  A digital ultrasound image was acquired.  The fistula was then accessed under ultrasound guidance using a micropuncture needle.  An 018 wire was then asvanced without resistance and a micropuncture sheath was placed.  Contrast injections were then performed through the sheath.  Findings: No evidence of central venous stenosis.  The basilic vein near the arterial anastomosis is small in caliber however no significant stenosis is identified.  There is a focal tandem greater than 80% stenosis in the basilic vein in the upper arm.   Intervention: After the above images were acquired the decision was made to proceed with intervention.  A 6 French sheath was placed over a Bentson wire.  I first selected a 5 x 40 Mustang balloon to perform balloon venoplasty of the stenosis at nominal pressure.  Follow-up imaging revealed improved but suboptimal results and so I upsized to a 7 x 40 balloon.  This was taken to 18 atm for 1 minute.  Follow-up imaging revealed significant improvement in the stenosis, now less than 15%.  I elected to stop at this time.  The access site was removed and a Monocryl was used for closure.  Impression:  #1   Successful balloon venoplasty of tandem greater than 80% stenosis within the basilic vein with less than 50% residual stenosis  #2  Fistula is ready for immediate use  #3  Access remains amenable to percutaneous interventions in the future.     ALONSO Malvina New, M.D., [facility] [place] Office: [phone] Pager:  [phone]

## 2023-08-26 NOTE — Interval H&P Note (Signed)
 History and Physical Interval Note:  08/26/2023 8:31 AM  Samuel Burns  has presented today for surgery, with the diagnosis of N18.6.  The various methods of treatment have been discussed with the patient and family. After consideration of risks, benefits and other options for treatment, the patient has consented to  Procedure(s): A/V SHUNT INTERVENTION (Right) as a surgical intervention.  The patient's history has been reviewed, patient examined, no change in status, stable for surgery.  I have reviewed the patient's chart and labs.  Questions were answered to the patient's satisfaction.     Malvina New

## 2023-08-27 ENCOUNTER — Encounter (HOSPITAL_COMMUNITY): Payer: Self-pay | Admitting: Surgery

## 2023-08-27 DIAGNOSIS — D631 Anemia in chronic kidney disease: Secondary | ICD-10-CM | POA: Diagnosis not present

## 2023-08-30 DIAGNOSIS — Z992 Dependence on renal dialysis: Secondary | ICD-10-CM | POA: Diagnosis not present

## 2023-08-30 DIAGNOSIS — D631 Anemia in chronic kidney disease: Secondary | ICD-10-CM | POA: Diagnosis not present

## 2023-08-30 DIAGNOSIS — N2581 Secondary hyperparathyroidism of renal origin: Secondary | ICD-10-CM | POA: Diagnosis not present

## 2023-08-30 DIAGNOSIS — D689 Coagulation defect, unspecified: Secondary | ICD-10-CM | POA: Diagnosis not present

## 2023-08-30 DIAGNOSIS — N186 End stage renal disease: Secondary | ICD-10-CM | POA: Diagnosis not present

## 2023-09-01 DIAGNOSIS — D689 Coagulation defect, unspecified: Secondary | ICD-10-CM | POA: Diagnosis not present

## 2023-09-05 ENCOUNTER — Other Ambulatory Visit: Payer: Self-pay | Admitting: Internal Medicine

## 2023-09-05 DIAGNOSIS — F411 Generalized anxiety disorder: Secondary | ICD-10-CM

## 2023-09-05 DIAGNOSIS — F330 Major depressive disorder, recurrent, mild: Secondary | ICD-10-CM

## 2023-09-06 ENCOUNTER — Other Ambulatory Visit: Payer: Self-pay

## 2023-09-06 ENCOUNTER — Other Ambulatory Visit (HOSPITAL_COMMUNITY): Payer: Self-pay

## 2023-09-06 DIAGNOSIS — D631 Anemia in chronic kidney disease: Secondary | ICD-10-CM | POA: Diagnosis not present

## 2023-09-06 DIAGNOSIS — D689 Coagulation defect, unspecified: Secondary | ICD-10-CM | POA: Diagnosis not present

## 2023-09-06 DIAGNOSIS — N186 End stage renal disease: Secondary | ICD-10-CM | POA: Diagnosis not present

## 2023-09-06 DIAGNOSIS — Z992 Dependence on renal dialysis: Secondary | ICD-10-CM | POA: Diagnosis not present

## 2023-09-06 DIAGNOSIS — N2581 Secondary hyperparathyroidism of renal origin: Secondary | ICD-10-CM | POA: Diagnosis not present

## 2023-09-07 ENCOUNTER — Other Ambulatory Visit (HOSPITAL_COMMUNITY): Payer: Self-pay

## 2023-09-10 DIAGNOSIS — N186 End stage renal disease: Secondary | ICD-10-CM | POA: Diagnosis not present

## 2023-09-10 DIAGNOSIS — Z4800 Encounter for change or removal of nonsurgical wound dressing: Secondary | ICD-10-CM | POA: Diagnosis not present

## 2023-09-10 DIAGNOSIS — D631 Anemia in chronic kidney disease: Secondary | ICD-10-CM | POA: Diagnosis not present

## 2023-09-10 DIAGNOSIS — Z992 Dependence on renal dialysis: Secondary | ICD-10-CM | POA: Diagnosis not present

## 2023-09-10 DIAGNOSIS — N2581 Secondary hyperparathyroidism of renal origin: Secondary | ICD-10-CM | POA: Diagnosis not present

## 2023-09-13 DIAGNOSIS — Z4800 Encounter for change or removal of nonsurgical wound dressing: Secondary | ICD-10-CM | POA: Diagnosis not present

## 2023-09-15 DIAGNOSIS — Z992 Dependence on renal dialysis: Secondary | ICD-10-CM | POA: Diagnosis not present

## 2023-09-15 DIAGNOSIS — N2581 Secondary hyperparathyroidism of renal origin: Secondary | ICD-10-CM | POA: Diagnosis not present

## 2023-09-15 DIAGNOSIS — N186 End stage renal disease: Secondary | ICD-10-CM | POA: Diagnosis not present

## 2023-09-15 DIAGNOSIS — D689 Coagulation defect, unspecified: Secondary | ICD-10-CM | POA: Diagnosis not present

## 2023-09-15 DIAGNOSIS — Z4800 Encounter for change or removal of nonsurgical wound dressing: Secondary | ICD-10-CM | POA: Diagnosis not present

## 2023-09-15 DIAGNOSIS — D631 Anemia in chronic kidney disease: Secondary | ICD-10-CM | POA: Diagnosis not present

## 2023-09-16 DIAGNOSIS — N186 End stage renal disease: Secondary | ICD-10-CM | POA: Diagnosis not present

## 2023-09-16 DIAGNOSIS — I129 Hypertensive chronic kidney disease with stage 1 through stage 4 chronic kidney disease, or unspecified chronic kidney disease: Secondary | ICD-10-CM | POA: Diagnosis not present

## 2023-09-16 DIAGNOSIS — Z992 Dependence on renal dialysis: Secondary | ICD-10-CM | POA: Diagnosis not present

## 2023-09-17 DIAGNOSIS — Z4800 Encounter for change or removal of nonsurgical wound dressing: Secondary | ICD-10-CM | POA: Diagnosis not present

## 2023-09-17 DIAGNOSIS — N186 End stage renal disease: Secondary | ICD-10-CM | POA: Diagnosis not present

## 2023-09-17 DIAGNOSIS — D689 Coagulation defect, unspecified: Secondary | ICD-10-CM | POA: Diagnosis not present

## 2023-09-17 DIAGNOSIS — N2581 Secondary hyperparathyroidism of renal origin: Secondary | ICD-10-CM | POA: Diagnosis not present

## 2023-09-17 DIAGNOSIS — D631 Anemia in chronic kidney disease: Secondary | ICD-10-CM | POA: Diagnosis not present

## 2023-09-17 DIAGNOSIS — Z992 Dependence on renal dialysis: Secondary | ICD-10-CM | POA: Diagnosis not present

## 2023-09-20 ENCOUNTER — Telehealth: Payer: Self-pay | Admitting: Internal Medicine

## 2023-09-20 ENCOUNTER — Other Ambulatory Visit (HOSPITAL_COMMUNITY): Payer: Self-pay

## 2023-09-20 DIAGNOSIS — D689 Coagulation defect, unspecified: Secondary | ICD-10-CM | POA: Diagnosis not present

## 2023-09-20 DIAGNOSIS — Z992 Dependence on renal dialysis: Secondary | ICD-10-CM | POA: Diagnosis not present

## 2023-09-20 DIAGNOSIS — Z4800 Encounter for change or removal of nonsurgical wound dressing: Secondary | ICD-10-CM | POA: Diagnosis not present

## 2023-09-20 DIAGNOSIS — N2581 Secondary hyperparathyroidism of renal origin: Secondary | ICD-10-CM | POA: Diagnosis not present

## 2023-09-20 DIAGNOSIS — D631 Anemia in chronic kidney disease: Secondary | ICD-10-CM | POA: Diagnosis not present

## 2023-09-20 DIAGNOSIS — N186 End stage renal disease: Secondary | ICD-10-CM | POA: Diagnosis not present

## 2023-09-20 NOTE — Telephone Encounter (Signed)
 Called patient, no answer. Left voicemail confirming upcoming appointment on 09/21/2023 at 10:10 am. Provided callback number for any questions or changes.

## 2023-09-21 ENCOUNTER — Ambulatory Visit: Payer: 59

## 2023-09-21 ENCOUNTER — Ambulatory Visit: Admitting: Nurse Practitioner

## 2023-09-22 ENCOUNTER — Observation Stay (HOSPITAL_COMMUNITY)
Admission: EM | Admit: 2023-09-22 | Discharge: 2023-09-24 | Disposition: A | Discharge status: Home / Self Care | Attending: Internal Medicine | Admitting: Internal Medicine

## 2023-09-22 ENCOUNTER — Other Ambulatory Visit: Payer: Self-pay

## 2023-09-22 ENCOUNTER — Encounter (HOSPITAL_COMMUNITY): Payer: Self-pay

## 2023-09-22 ENCOUNTER — Emergency Department (HOSPITAL_COMMUNITY)

## 2023-09-22 DIAGNOSIS — N186 End stage renal disease: Secondary | ICD-10-CM | POA: Insufficient documentation

## 2023-09-22 DIAGNOSIS — Z992 Dependence on renal dialysis: Secondary | ICD-10-CM | POA: Diagnosis not present

## 2023-09-22 DIAGNOSIS — F32A Depression, unspecified: Secondary | ICD-10-CM | POA: Insufficient documentation

## 2023-09-22 DIAGNOSIS — R231 Pallor: Secondary | ICD-10-CM | POA: Diagnosis not present

## 2023-09-22 DIAGNOSIS — N2581 Secondary hyperparathyroidism of renal origin: Secondary | ICD-10-CM | POA: Diagnosis not present

## 2023-09-22 DIAGNOSIS — D638 Anemia in other chronic diseases classified elsewhere: Secondary | ICD-10-CM | POA: Diagnosis not present

## 2023-09-22 DIAGNOSIS — D631 Anemia in chronic kidney disease: Secondary | ICD-10-CM | POA: Insufficient documentation

## 2023-09-22 DIAGNOSIS — J45909 Unspecified asthma, uncomplicated: Secondary | ICD-10-CM | POA: Diagnosis not present

## 2023-09-22 DIAGNOSIS — R55 Syncope and collapse: Principal | ICD-10-CM

## 2023-09-22 DIAGNOSIS — I120 Hypertensive chronic kidney disease with stage 5 chronic kidney disease or end stage renal disease: Secondary | ICD-10-CM | POA: Insufficient documentation

## 2023-09-22 DIAGNOSIS — I959 Hypotension, unspecified: Principal | ICD-10-CM | POA: Insufficient documentation

## 2023-09-22 DIAGNOSIS — F320 Major depressive disorder, single episode, mild: Secondary | ICD-10-CM | POA: Diagnosis present

## 2023-09-22 DIAGNOSIS — Z4682 Encounter for fitting and adjustment of non-vascular catheter: Secondary | ICD-10-CM | POA: Diagnosis not present

## 2023-09-22 DIAGNOSIS — Z4800 Encounter for change or removal of nonsurgical wound dressing: Secondary | ICD-10-CM | POA: Diagnosis not present

## 2023-09-22 DIAGNOSIS — E861 Hypovolemia: Secondary | ICD-10-CM | POA: Diagnosis not present

## 2023-09-22 DIAGNOSIS — D689 Coagulation defect, unspecified: Secondary | ICD-10-CM | POA: Diagnosis not present

## 2023-09-22 DIAGNOSIS — I10 Essential (primary) hypertension: Secondary | ICD-10-CM | POA: Diagnosis present

## 2023-09-22 DIAGNOSIS — R42 Dizziness and giddiness: Secondary | ICD-10-CM | POA: Diagnosis not present

## 2023-09-22 LAB — CBC WITH DIFFERENTIAL/PLATELET
Abs Immature Granulocytes: 0.07 K/uL (ref 0.00–0.07)
Basophils Absolute: 0.1 K/uL (ref 0.0–0.1)
Basophils Relative: 1 %
Eosinophils Absolute: 0.1 K/uL (ref 0.0–0.5)
Eosinophils Relative: 1 %
HCT: 37.2 % — ABNORMAL LOW (ref 39.0–52.0)
Hemoglobin: 12 g/dL — ABNORMAL LOW (ref 13.0–17.0)
Immature Granulocytes: 1 %
Lymphocytes Relative: 7 %
Lymphs Abs: 1 K/uL (ref 0.7–4.0)
MCH: 30.8 pg (ref 26.0–34.0)
MCHC: 32.3 g/dL (ref 30.0–36.0)
MCV: 95.6 fL (ref 80.0–100.0)
Monocytes Absolute: 0.9 K/uL (ref 0.1–1.0)
Monocytes Relative: 7 %
Neutro Abs: 11.9 K/uL — ABNORMAL HIGH (ref 1.7–7.7)
Neutrophils Relative %: 83 %
Platelets: 224 K/uL (ref 150–400)
RBC: 3.89 MIL/uL — ABNORMAL LOW (ref 4.22–5.81)
RDW: 14.9 % (ref 11.5–15.5)
WBC: 14.1 K/uL — ABNORMAL HIGH (ref 4.0–10.5)
nRBC: 0 % (ref 0.0–0.2)

## 2023-09-22 LAB — COMPREHENSIVE METABOLIC PANEL WITH GFR
ALT: 12 U/L (ref 0–44)
AST: 14 U/L — ABNORMAL LOW (ref 15–41)
Albumin: 4.2 g/dL (ref 3.5–5.0)
Alkaline Phosphatase: 184 U/L — ABNORMAL HIGH (ref 38–126)
Anion gap: 17 — ABNORMAL HIGH (ref 5–15)
BUN: 28 mg/dL — ABNORMAL HIGH (ref 6–20)
CO2: 27 mmol/L (ref 22–32)
Calcium: 8.5 mg/dL — ABNORMAL LOW (ref 8.9–10.3)
Chloride: 96 mmol/L — ABNORMAL LOW (ref 98–111)
Creatinine, Ser: 8.92 mg/dL — ABNORMAL HIGH (ref 0.61–1.24)
GFR, Estimated: 7 mL/min — ABNORMAL LOW (ref >60)
Glucose, Bld: 97 mg/dL (ref 70–99)
Potassium: 3.9 mmol/L (ref 3.5–5.1)
Sodium: 140 mmol/L (ref 135–145)
Total Bilirubin: 0.9 mg/dL (ref 0.0–1.2)
Total Protein: 8.3 g/dL — ABNORMAL HIGH (ref 6.5–8.1)

## 2023-09-22 LAB — TROPONIN I (HIGH SENSITIVITY): Troponin I (High Sensitivity): 35 ng/L — ABNORMAL HIGH (ref <18)

## 2023-09-22 LAB — CBG MONITORING, ED: Glucose-Capillary: 80 mg/dL (ref 70–99)

## 2023-09-22 MED ORDER — MIDODRINE HCL 5 MG PO TABS
10.0000 mg | ORAL_TABLET | Freq: Once | ORAL | Status: AC
  Administered 2023-09-22: 10 mg via ORAL
  Filled 2023-09-22: qty 2

## 2023-09-22 MED ORDER — CALCIUM GLUCONATE-NACL 1-0.675 GM/50ML-% IV SOLN
1.0000 g | Freq: Once | INTRAVENOUS | Status: AC
  Administered 2023-09-22: 1000 mg via INTRAVENOUS
  Filled 2023-09-22: qty 50

## 2023-09-22 MED ORDER — SODIUM CHLORIDE 0.9 % IV BOLUS
1000.0000 mL | Freq: Once | INTRAVENOUS | Status: AC
  Administered 2023-09-22: 1000 mL via INTRAVENOUS

## 2023-09-22 NOTE — ED Notes (Signed)
 ED Provider at bedside.

## 2023-09-22 NOTE — ED Triage Notes (Signed)
 Patient arrives with [facility] from home after having syncopal episode. Patient is a dialysis pt; had dialysis today and had 820 mL off, when he usually gets 850 off per EMS. Per family, patient later passed out at home for 5 mins. Patient reports chest tightness; patient was hypotensive with EMS at 78/46; 100 mL NS bolus given, BP increased to 90/70.   EMS vitals:  HR 70's 99 O2 on RA 101 CBG  Right arm restricted

## 2023-09-22 NOTE — H&P (Signed)
 History and Physical    Patient: Samuel Burns FMW:[id number] DOB: 15-Jun-1985 DOA: 09/22/2023 DOS: the patient was seen and examined on 09/22/2023 PCP: Vicci Barnie NOVAK, MD  Patient coming from: Home  Chief Complaint:  Chief Complaint  Patient presents with   Loss of Consciousness   HPI: Samuel Burns is a 38 y.o. male with medical history significant of end-stage renal disease on hemodialysis Mondays Wednesdays and Fridays, essential hypertension, depression, asthma, anxiety disorder, anemia of chronic disease, ADHD who presented to the ER after dialysis today with syncopal episode.  Patient apparently had about 820 mL of fluid removed at dialysis today.  When he arrived home he reported some chest tightness and passed out for about 5 minutes.  At that point EMS was called.  His initial blood pressure was 78/46.  They gave him 1 L normal saline bolus systolic increased to 70.  He was brought to the ER where he remained hypotensive with initial blood pressure of 76/54.  Patient has received fluid now and up to 100/66.  He is being admitted for observation.  He denied any other complaint.  Patient has leukocytosis otherwise.  Suspicion is patient was overtreated at hemodialysis.  Review of Systems: As mentioned in the history of present illness. All other systems reviewed and are negative. Past Medical History:  Diagnosis Date   ADHD (attention deficit hyperactivity disorder)    ADHD   Anemia    Anxiety    Asthma    as a child no current problems as adult, no inhaler   Depression    Hypertension    Renal failure    Dialysis M/W/F   Past Surgical History:  Procedure Laterality Date   A/V SHUNT INTERVENTION Right 08/26/2023   Procedure: A/V SHUNT INTERVENTION;  Surgeon: Serene Gaile ORN, MD;  Location: [facility];  Service: Cardiovascular;  Laterality: Right;   AV FISTULA PLACEMENT Right 11/24/2019   Procedure: Creation of RIGHT ARM Brachial/Cephalic ARTERIOVENOUS (AV) FISTULA.;   Surgeon: Gretta Lonni PARAS, MD;  Location: [facility];  Service: Vascular;  Laterality: Right;   AV FISTULA PLACEMENT Left 12/22/2021   Procedure: LEFT ARM BRACHIOCEPHALIC ARTERIOVENOUS (AV) FISTULA CREATION;  Surgeon: Gretta Lonni PARAS, MD;  Location: [facility];  Service: Vascular;  Laterality: Left;   AV FISTULA PLACEMENT Left 12/03/2022   Procedure: INSERTION OF LEFT ARM ARTERIOVENOUS (AV) GOR-TEX GRAFT;  Surgeon: Magda Debby SAILOR, MD;  Location: [facility];  Service: Vascular;  Laterality: Left;   AV FISTULA PLACEMENT Right 04/05/2023   Procedure: RIGHT FIRST STAGE BRACHIOBASILIC ARTERIOVENOUS (AV) FISTULA CREATION;  Surgeon: Gretta Lonni PARAS, MD;  Location: [facility];  Service: Vascular;  Laterality: Right;  Regional block   BASCILIC VEIN TRANSPOSITION Left 01/27/2022   Procedure: LEFT FIRST STAGE BASILIC VEIN TRANSPOSITION;  Surgeon: Lanis Fonda BRAVO, MD;  Location: [facility] OR;  Service: Vascular;  Laterality: Left;  PERIPHERAL NERVE BLOCK   BASCILIC VEIN TRANSPOSITION Right 06/24/2023   Procedure: TRANSPOSITION, VEIN, BASILIC;  Surgeon: Magda Debby SAILOR, MD;  Location: [facility];  Service: Vascular;  Laterality: Right;   CAPD INSERTION N/A 09/10/2022   Procedure: LAPAROSCOPIC INSERTION CONTINUOUS AMBULATORY PERITONEAL DIALYSIS  (CAPD) CATHETER;  Surgeon: Magda Debby SAILOR, MD;  Location: [facility];  Service: Vascular;  Laterality: N/A;   CAPD REMOVAL N/A 12/03/2022   Procedure: REMOVAL CONTINUOUS AMBULATORY PERITONEAL DIALYSIS CATHETER;  Surgeon: Magda Debby SAILOR, MD;  Location: [facility];  Service: Vascular;  Laterality: N/A;   IR AV DIALY SHUNT INTRO NEEDLE/INTRACATH INITIAL W/PTA/IMG RIGHT  Right 02/11/2021   IR DIALY SHUNT INTRO NEEDLE/INTRACATH INITIAL W/IMG RIGHT Right 04/30/2020   IR FLUORO GUIDE CV LINE LEFT  11/21/2021   IR FLUORO GUIDE CV LINE RIGHT  11/20/2019   IR REMOVAL TUN CV CATH W/O FL  06/04/2020   IR US  GUIDE VASC ACCESS LEFT  11/21/2021   IR US  GUIDE VASC ACCESS RIGHT  11/20/2019   IR US  GUIDE  VASC ACCESS RIGHT  04/30/2020   IR US  GUIDE VASC ACCESS RIGHT  02/11/2021   UPPER EXTREMITY VENOGRAPHY Bilateral 02/04/2023   Procedure: UPPER EXTREMITY VENOGRAPHY;  Surgeon: Gretta Lonni PARAS, MD;  [facility];  Service: Cardiovascular;  Laterality: Bilateral;   VENOUS ANGIOPLASTY  08/26/2023   Procedure: VENOUS ANGIOPLASTY;  Surgeon: Serene Gaile ORN, MD;  Location: [facility];  Service: Cardiovascular;;  Basilic Vein   Social History:  reports that he has never smoked. He has never been exposed to tobacco smoke. He has never used smokeless tobacco. He reports current drug use. Drug: Marijuana. He reports that he does not drink alcohol.  No Known Allergies  Family History  Problem Relation Age of Onset   Hypertension Maternal Grandmother    Kidney disease Father     Prior to Admission medications   Medication Sig Start Date End Date Taking? Authorizing Provider  acetaminophen  (TYLENOL ) 500 MG tablet Take 1,000 mg by mouth every 6 (six) hours as needed for moderate pain (pain score 4-6) or headache.    [provider]  amLODipine  (NORVASC ) 10 MG tablet Take 1 tablet (10 mg total) by mouth at bedtime. 12/09/21   Fleming, Zelda W, NP  B Complex-C-Zn-Folic Acid  (DIALYVITE  800-ZINC  15) 0.8 MG TABS Take 1 tablet by mouth daily. 08/16/22   Vicci Barnie NOVAK, MD  BIOTIN PO Take 500 mg by mouth daily.    [provider]  cinacalcet  (SENSIPAR ) 60 MG tablet Take 1 tablet (60 mg total) by mouth daily. 02/24/23   Vicci Barnie NOVAK, MD  cloNIDine  (CATAPRES ) 0.1 MG tablet Take 1 tablet (0.1 mg total) by mouth 2 (two) times daily. 08/03/23   Vicci Barnie NOVAK, MD  fluticasone  (FLONASE ) 50 MCG/ACT nasal spray Place 2 sprays into both nostrils daily. Patient taking differently: Place 2 sprays into both nostrils daily as needed for allergies. 05/13/21   Gladis Elsie BROCKS, PA-C  hydrOXYzine  (VISTARIL ) 25 MG capsule Take 1 capsule (25 mg total) by mouth in the morning and  at bedtime. 08/03/23   Vicci Barnie NOVAK, MD  lactulose, encephalopathy, (CHRONULAC) 10 GM/15ML SOLN Take 10 g by mouth daily as needed (Constipation). 09/15/22   [provider]  losartan  (COZAAR ) 50 MG tablet Take 1 tablet (50 mg total) by mouth in the morning. 01/22/23   Vicci Barnie NOVAK, MD  Metoprolol  Tartrate 75 MG TABS Take 1 tablet (75 mg total) by mouth 2 (two) times daily. 05/01/23 04/30/24  Vicci Barnie NOVAK, MD  Naphazoline HCl (CLEAR EYES OP) Place 1 drop into both eyes in the morning.    [provider]  oxyCODONE -acetaminophen  (PERCOCET) 5-325 MG tablet Take 1 tablet by mouth every 6 (six) hours as needed. 06/24/23   Bethanie Cough, PA-C  sertraline  (ZOLOFT ) 100 MG tablet Take 1 tablet (100 mg total) by mouth in the morning. 09/03/22   Vicci Barnie NOVAK, MD  sildenafil  (VIAGRA ) 50 MG tablet 1 tab PO 1/2 hr prior to sex PRN.  Limit use to 1 tab/24 hr 07/30/20   Vicci Barnie NOVAK, MD  sucroferric  oxyhydroxide (VELPHORO ) 500 MG chewable tablet Chew 500 mg by mouth 3 (three) times daily with meals. 10/22/21   [provider]    Physical Exam: Vitals:   09/22/23 2130 09/22/23 2216 09/22/23 2230 09/22/23 2245  BP: (!) 76/54 (!) 83/51 (!) 77/51 100/66  Pulse: 77 75 73 80  Resp: 17 14 13 14   Temp:      TempSrc:      SpO2: 100% 100% 100% 100%  Weight:      Height:       Constitutional: Acutely ill looking no distress NAD, calm, comfortable Eyes: PERRL, lids and conjunctivae normal ENMT: Mucous membranes are moist. Posterior pharynx clear of any exudate or lesions.Normal dentition.  Neck: normal, supple, no masses, no thyromegaly Respiratory: clear to auscultation bilaterally, no wheezing, no crackles. Normal respiratory effort. No accessory muscle use.  Cardiovascular: Regular rate and rhythm, no murmurs / rubs / gallops. No extremity edema. 2+ pedal pulses. No carotid bruits.  Abdomen: no tenderness, no masses palpated. No hepatosplenomegaly. Bowel sounds  positive.  Musculoskeletal: Good range of motion, no joint swelling or tenderness, Skin: no rashes, lesions, ulcers. No induration Neurologic: CN 2-12 grossly intact. Sensation intact, DTR normal. Strength 5/5 in all 4.  Psychiatric: Normal judgment and insight. Alert and oriented x 3. Normal mood  Data Reviewed:  Blood pressure 76/54 originally currently 100/66, his pulse is 80.  White count 14.1 hemoglobin 12.1 BUN 28 creatinine 8.92 chloride 96 calcium  8.5 troponin 35.  Assessment and Plan:  #1 syncope: Most likely due to significant hypotension.  Patient is stable at the moment.  Will correct blood pressure.  Monitor patient on telemetry.  Mobilize patient.  May consider PT if persistently symptomatic.  #2 persistent hypotension: Most likely due to over treatment.  Patient was at dialysis today.  Almost a liter was removed.  There could also be concomitant poor oral intake.  Patient will be hydrated.  Blood pressure already improving.  Hold all blood pressure medications.  #3 end-stage renal disease: On hemodialysis Mondays Wednesdays and Fridays.  Patient will be returned to hemodialysis on Friday.  He has been dialyzed today.  Nephrology to decide on extent of the dialysis orders.  #4 essential hypertension: Will hold blood pressure medications in the setting of hypotension.  #5 depression: Will resume home regimen once confirmed  #6 anemia of chronic disease: Hemoglobin is 12 g.  Will continue to monitor    Advance Care Planning:   Code Status: Full Code   Consults: Consult nephrology in the morning  Family Communication: No family at bedside  Severity of Illness: The appropriate patient status for this patient is OBSERVATION. Observation status is judged to be reasonable and necessary in order to provide the required intensity of service to ensure the patient's safety. The patient's presenting symptoms, physical exam findings, and initial radiographic and laboratory data in  the context of their medical condition is felt to place them at decreased risk for further clinical deterioration. Furthermore, it is anticipated that the patient will be medically stable for discharge from the hospital within 2 midnights of admission.   AuthorBETHA SIM KNOLL, MD 09/22/2023 10:59 PM  For on call review [email or web address].

## 2023-09-22 NOTE — ED Provider Notes (Signed)
 [place] EMERGENCY DEPARTMENT AT [facility] Provider Note   CSN: [id number] Arrival date & time: 09/22/23  1945     Patient presents with: Loss of Consciousness   Samuel Burns is a 38 y.o. male past medical history significant for ESRD on dialysis, hypertension, and anxiety presents today for syncopal episode.  Patient reports he had chest tightness during the event.  Patient was hypotensive with EMS at 78/46, patient was given 100 mL NS bolus, blood pressure improved to 90/70.  Patient states that he normally does not take his hypertension medications after dialysis until he goes to bed, however he did take them after dialysis today.  Patient reports mild lightheadedness.  Patient denies shortness of breath, nausea, vomiting, diarrhea, headache, double vision, numbness, weakness, any other complaints at this time.    Loss of Consciousness      Prior to Admission medications   Medication Sig Start Date End Date Taking? Authorizing Provider  acetaminophen  (TYLENOL ) 500 MG tablet Take 1,000 mg by mouth every 6 (six) hours as needed for moderate pain (pain score 4-6) or headache.    [provider]  amLODipine  (NORVASC ) 10 MG tablet Take 1 tablet (10 mg total) by mouth at bedtime. 12/09/21   Fleming, Zelda W, NP  B Complex-C-Zn-Folic Acid  (DIALYVITE  800-ZINC  15) 0.8 MG TABS Take 1 tablet by mouth daily. 08/16/22   Vicci Barnie NOVAK, MD  BIOTIN PO Take 500 mg by mouth daily.    [provider]  cinacalcet  (SENSIPAR ) 60 MG tablet Take 1 tablet (60 mg total) by mouth daily. 02/24/23   Vicci Barnie NOVAK, MD  cloNIDine  (CATAPRES ) 0.1 MG tablet Take 1 tablet (0.1 mg total) by mouth 2 (two) times daily. 08/03/23   Vicci Barnie NOVAK, MD  fluticasone  (FLONASE ) 50 MCG/ACT nasal spray Place 2 sprays into both nostrils daily. Patient taking differently: Place 2 sprays into both nostrils daily as needed for allergies. 05/13/21   Gladis Elsie BROCKS, PA-C  hydrOXYzine   (VISTARIL ) 25 MG capsule Take 1 capsule (25 mg total) by mouth in the morning and at bedtime. 08/03/23   Vicci Barnie NOVAK, MD  lactulose, encephalopathy, (CHRONULAC) 10 GM/15ML SOLN Take 10 g by mouth daily as needed (Constipation). 09/15/22   [provider]  losartan  (COZAAR ) 50 MG tablet Take 1 tablet (50 mg total) by mouth in the morning. 01/22/23   Vicci Barnie NOVAK, MD  Metoprolol  Tartrate 75 MG TABS Take 1 tablet (75 mg total) by mouth 2 (two) times daily. 05/01/23 04/30/24  Vicci Barnie NOVAK, MD  Naphazoline HCl (CLEAR EYES OP) Place 1 drop into both eyes in the morning.    [provider]  oxyCODONE -acetaminophen  (PERCOCET) 5-325 MG tablet Take 1 tablet by mouth every 6 (six) hours as needed. 06/24/23   Bethanie Cough, PA-C  sertraline  (ZOLOFT ) 100 MG tablet Take 1 tablet (100 mg total) by mouth in the morning. 09/03/22   Vicci Barnie NOVAK, MD  sildenafil  (VIAGRA ) 50 MG tablet 1 tab PO 1/2 hr prior to sex PRN.  Limit use to 1 tab/24 hr 07/30/20   Vicci Barnie NOVAK, MD  sucroferric oxyhydroxide (VELPHORO ) 500 MG chewable tablet Chew 500 mg by mouth 3 (three) times daily with meals. 10/22/21   [provider]    Allergies: Patient has no known allergies.    Review of Systems  Respiratory:  Positive for chest tightness.   Cardiovascular:  Positive for syncope.  Neurological:  Positive for syncope and light-headedness.    Updated  Vital Signs BP 100/66   Pulse 80   Temp 98.2 F (36.8 C) (Oral)   Resp 14   Ht 5' 8 (1.727 m)   Wt 82.1 kg   SpO2 100%   BMI 27.52 kg/m   Physical Exam Vitals and nursing note reviewed.  Constitutional:      General: He is not in acute distress.    Appearance: Normal appearance. He is well-developed. He is not ill-appearing.  HENT:     Head: Normocephalic and atraumatic.     Right Ear: External ear normal.     Left Ear: External ear normal.     Nose: Nose normal.     Mouth/Throat:     Mouth: Mucous membranes are moist.   Eyes:     Extraocular Movements: Extraocular movements intact.     Conjunctiva/sclera: Conjunctivae normal.     Pupils: Pupils are equal, round, and reactive to light.  Cardiovascular:     Rate and Rhythm: Normal rate and regular rhythm.     Pulses: Normal pulses.     Heart sounds: Normal heart sounds. No murmur heard. Pulmonary:     Effort: Pulmonary effort is normal. No respiratory distress.     Breath sounds: Normal breath sounds.  Abdominal:     Palpations: Abdomen is soft.     Tenderness: There is no abdominal tenderness.  Musculoskeletal:        General: No swelling.     Cervical back: Normal range of motion and neck supple.  Skin:    General: Skin is warm and dry.     Capillary Refill: Capillary refill takes less than 2 seconds.  Neurological:     General: No focal deficit present.     Mental Status: He is alert and oriented to person, place, and time.     Sensory: No sensory deficit.     Motor: No weakness.  Psychiatric:        Mood and Affect: Mood normal.     (all labs ordered are listed, but only abnormal results are displayed) Labs Reviewed  CBC WITH DIFFERENTIAL/PLATELET - Abnormal; Notable for the following components:      Result Value   WBC 14.1 (*)    RBC 3.89 (*)    Hemoglobin 12.0 (*)    HCT 37.2 (*)    Neutro Abs 11.9 (*)    All other components within normal limits  COMPREHENSIVE METABOLIC PANEL WITH GFR - Abnormal; Notable for the following components:   Chloride 96 (*)    BUN 28 (*)    Creatinine, Ser 8.92 (*)    Calcium  8.5 (*)    Total Protein 8.3 (*)    AST 14 (*)    Alkaline Phosphatase 184 (*)    GFR, Estimated 7 (*)    Anion gap 17 (*)    All other components within normal limits  TROPONIN I (HIGH SENSITIVITY) - Abnormal; Notable for the following components:   Troponin I (High Sensitivity) 35 (*)    All other components within normal limits  URINALYSIS, ROUTINE W REFLEX MICROSCOPIC  CBG MONITORING, ED  TROPONIN I (HIGH  SENSITIVITY)    EKG: None  Radiology: DG Chest Portable 1 View Result Date: 09/22/2023 CLINICAL DATA:  syncope EXAM: PORTABLE CHEST 1 VIEW COMPARISON:  Chest x-ray 02/06/2022, CT chest 10/23/2021 FINDINGS: Left chest wall dialysis catheter with tip overlying the right atrium. The heart and mediastinal contours are within normal limits. No focal consolidation. No pulmonary edema. No pleural effusion. No  pneumothorax. No acute osseous abnormality. IMPRESSION: No active disease. Electronically Signed   By: Morgane  Naveau M.D.   On: 09/22/2023 20:33     Procedures   Medications Ordered in the ED  midodrine  (PROAMATINE ) tablet 10 mg (has no administration in time range)  calcium  gluconate 1 g/ 50 mL sodium chloride  IVPB (has no administration in time range)  sodium chloride  0.9 % bolus 1,000 mL (1,000 mLs Intravenous New Bag/Given 09/22/23 2206)                                    Medical Decision Making Amount and/or Complexity of Data Reviewed Labs: ordered. Radiology: ordered.   This patient presents to the ED for concern of syncopal episode, this involves an extensive number of treatment options, and is a complaint that carries with it a high risk of complications and morbidity.  The differential diagnosis includes orthostatic hypotension, STEMI, NSTEMI, arrhythmia, electrolyte abnormality, anemia   Co morbidities / Chronic conditions that complicate the patient evaluation  ESRD   Additional history obtained:  Additional history obtained from EMR External records from outside source obtained and reviewed including cardiology   Lab Tests:  I Ordered, and personally interpreted labs.  The pertinent results include: CBG 80, leukocytosis of 14.1 with left shift, hemoglobin decreased at 12, elevated bun at 28, elevated creatinine at 8.92, decreased anion gap at 17, increased alk phos at 184 troponin 35   Imaging Studies ordered:  I ordered imaging studies including CXR  I  independently visualized and interpreted imaging which showed no active disease I agree with the radiologist interpretation   Cardiac Monitoring: / EKG:  The patient was maintained on a cardiac monitor.  I personally viewed and interpreted the cardiac monitored which showed an underlying rhythm of: Sinus   Problem List / ED Course / Critical interventions / Medication management  I ordered medication including IVF, calcium  gluconate, midodrine  I have reviewed the patients home medicines and have made adjustments as needed   Consultations Obtained:  I requested consultation with the Hospitalist, Dr. Sim and discussed lab and imaging findings as well as pertinent plan - they recommend: Agreeable to admission  Test / Admission - Considered:  Admit      Final diagnoses:  Syncope, unspecified syncope type  Hypotension, unspecified hypotension type    ED Discharge Orders     None          Francis Ileana SAILOR, PA-C 09/22/23 2256    Mannie Pac T, DO 09/22/23 2325

## 2023-09-23 ENCOUNTER — Other Ambulatory Visit: Payer: Self-pay

## 2023-09-23 ENCOUNTER — Encounter (HOSPITAL_COMMUNITY): Payer: Self-pay | Admitting: Internal Medicine

## 2023-09-23 DIAGNOSIS — I10 Essential (primary) hypertension: Secondary | ICD-10-CM | POA: Diagnosis not present

## 2023-09-23 DIAGNOSIS — D638 Anemia in other chronic diseases classified elsewhere: Secondary | ICD-10-CM | POA: Diagnosis not present

## 2023-09-23 DIAGNOSIS — F320 Major depressive disorder, single episode, mild: Secondary | ICD-10-CM | POA: Diagnosis not present

## 2023-09-23 DIAGNOSIS — I953 Hypotension of hemodialysis: Secondary | ICD-10-CM | POA: Diagnosis not present

## 2023-09-23 DIAGNOSIS — N186 End stage renal disease: Secondary | ICD-10-CM | POA: Diagnosis not present

## 2023-09-23 DIAGNOSIS — Z992 Dependence on renal dialysis: Secondary | ICD-10-CM | POA: Diagnosis not present

## 2023-09-23 LAB — CREATININE, SERUM
Creatinine, Ser: 9.89 mg/dL — ABNORMAL HIGH (ref 0.61–1.24)
GFR, Estimated: 6 mL/min — ABNORMAL LOW (ref >60)

## 2023-09-23 LAB — CBC
HCT: 31.7 % — ABNORMAL LOW (ref 39.0–52.0)
Hemoglobin: 10.4 g/dL — ABNORMAL LOW (ref 13.0–17.0)
MCH: 31.3 pg (ref 26.0–34.0)
MCHC: 32.8 g/dL (ref 30.0–36.0)
MCV: 95.5 fL (ref 80.0–100.0)
Platelets: 230 K/uL (ref 150–400)
RBC: 3.32 MIL/uL — ABNORMAL LOW (ref 4.22–5.81)
RDW: 14.9 % (ref 11.5–15.5)
WBC: 8.7 K/uL (ref 4.0–10.5)
nRBC: 0 % (ref 0.0–0.2)

## 2023-09-23 LAB — HIV ANTIBODY (ROUTINE TESTING W REFLEX): HIV Screen 4th Generation wRfx: NONREACTIVE

## 2023-09-23 LAB — MRSA NEXT GEN BY PCR, NASAL: MRSA by PCR Next Gen: NOT DETECTED

## 2023-09-23 LAB — TROPONIN I (HIGH SENSITIVITY): Troponin I (High Sensitivity): 32 ng/L — ABNORMAL HIGH (ref <18)

## 2023-09-23 LAB — HEPATITIS B SURFACE ANTIGEN: Hepatitis B Surface Ag: NONREACTIVE

## 2023-09-23 MED ORDER — DOCUSATE SODIUM 283 MG RE ENEM
1.0000 | ENEMA | RECTAL | Status: DC | PRN
Start: 2023-09-23 — End: 2023-09-24

## 2023-09-23 MED ORDER — ONDANSETRON HCL 4 MG PO TABS: 4.0000 mg | ORAL_TABLET | Freq: Four times a day (QID) | ORAL | Status: DC | PRN

## 2023-09-23 MED ORDER — MIDODRINE HCL 5 MG PO TABS
10.0000 mg | ORAL_TABLET | Freq: Three times a day (TID) | ORAL | Status: DC
  Filled 2023-09-23: qty 2

## 2023-09-23 MED ORDER — SERTRALINE HCL 100 MG PO TABS: 100.0000 mg | ORAL_TABLET | Freq: Every morning | ORAL | Status: DC

## 2023-09-23 MED ORDER — ACETAMINOPHEN 325 MG PO TABS: 650.0000 mg | ORAL_TABLET | Freq: Four times a day (QID) | ORAL | Status: DC | PRN

## 2023-09-23 MED ORDER — CHLORHEXIDINE GLUCONATE CLOTH 2 % EX PADS
6.0000 | MEDICATED_PAD | Freq: Every day | CUTANEOUS | Status: DC
  Administered 2023-09-24: 6 via TOPICAL

## 2023-09-23 MED ORDER — HYDROXYZINE HCL 25 MG PO TABS
25.0000 mg | ORAL_TABLET | Freq: Three times a day (TID) | ORAL | Status: DC | PRN
Start: 2023-09-23 — End: 2023-09-24

## 2023-09-23 MED ORDER — NEPRO/CARBSTEADY PO LIQD: 237.0000 mL | Freq: Three times a day (TID) | ORAL | Status: DC | PRN

## 2023-09-23 MED ORDER — CAMPHOR-MENTHOL 0.5-0.5 % EX LOTN: 1.0000 | TOPICAL_LOTION | Freq: Three times a day (TID) | CUTANEOUS | Status: DC | PRN

## 2023-09-23 MED ORDER — ACETAMINOPHEN 650 MG RE SUPP: 650.0000 mg | Freq: Four times a day (QID) | RECTAL | Status: DC | PRN

## 2023-09-23 MED ORDER — CALCIUM CARBONATE ANTACID 1250 MG/5ML PO SUSP: 500.0000 mg | Freq: Four times a day (QID) | ORAL | Status: DC | PRN

## 2023-09-23 MED ORDER — SORBITOL 70 % SOLN: 30.0000 mL | Status: DC | PRN

## 2023-09-23 MED ORDER — SODIUM CHLORIDE 0.9 % IV SOLN: INTRAVENOUS | Status: AC

## 2023-09-23 MED ORDER — HEPARIN SODIUM (PORCINE) 5000 UNIT/ML IJ SOLN
5000.0000 [IU] | Freq: Three times a day (TID) | INTRAMUSCULAR | Status: DC
  Administered 2023-09-23 – 2023-09-24 (×3): 5000 [IU] via SUBCUTANEOUS
  Filled 2023-09-23 (×3): qty 1

## 2023-09-23 MED ORDER — ORAL CARE MOUTH RINSE: 15.0000 mL | OROMUCOSAL | Status: DC | PRN

## 2023-09-23 MED ORDER — ONDANSETRON HCL 4 MG/2ML IJ SOLN: 4.0000 mg | Freq: Four times a day (QID) | INTRAMUSCULAR | Status: DC | PRN

## 2023-09-23 NOTE — Plan of Care (Signed)
   Problem: Education: Goal: Knowledge of General Education information will improve Description Including pain rating scale, medication(s)/side effects and non-pharmacologic comfort measures Outcome: Progressing

## 2023-09-23 NOTE — Plan of Care (Signed)

## 2023-09-23 NOTE — Progress Notes (Signed)
 New Admission Note:  Arrival Method: Stretcher Mental Orientation: Alert and oriented x 4 Telemetry: Box 21 Assessment: Completed Skin: Warm and dry  IV: Nsl Pain: Denies Tubes: N/A Safety Measures: Safety Fall Prevention Plan initiated.  Admission: Completed 5 M  Orientation: Patient has been orientated to the room, unit and the staff. Welcome booklet given.  Family: N/A  Orders have been reviewed and implemented. Will continue to monitor the patient. Call light has been placed within reach and bed alarm has been activated.   Durwood Dee BSN, RN  Phone Number: [phone]

## 2023-09-23 NOTE — TOC CM/SW Note (Signed)
 Transition of Care [facility]) - Inpatient Brief Assessment   Patient Details  Name: Samuel Burns MRN: [id number] Date of Birth: [birth year]  [facility] CM/SW Contact:    Tom-[name], Steen Bisig Daphne, RN Phone Number: 09/23/2023, 1:53 PM   Clinical Narrative:  Patient presented to the ED with Syncopal episode after his outpatient HD session. Patient noted to be Hypotensive with BP at 78/46, resuscitated with fluids and BP today at 95/62. Patient with hx of ESRD on MWF outpatient HD schedule. Nephrology following for iHD.  From home with his son and son's grandmother. Has a supportive brother. Not employed, on disability. Drives self to and from his appointments.  Does not have DME's at home.  PCP is Vicci Barnie NOVAK, MD and uses [facility] on [address]   No TOC needs or recommendations noted at this time.  Patient not Medically ready for discharge.  CM will continue to follow as patient progresses with care towards discharge.                    Transition of Care Asessment: Insurance and Status: Insurance coverage has been reviewed Patient has primary care physician: Yes Home environment has been reviewed: Yes Prior level of function:: Independent Prior/Current Home Services: No current home services Social Drivers of Health Review: SDOH reviewed no interventions necessary Readmission risk has been reviewed: Yes Transition of care needs: no transition of care needs at this time

## 2023-09-23 NOTE — Care Management Obs Status (Signed)
 MEDICARE OBSERVATION STATUS NOTIFICATION   Patient Details  Name: Samuel Burns MRN: [id number] Date of Birth: Aug 09, 1985   Medicare Observation Status Notification Given:    Obs letter signed and copy given   Claretta Deed 09/23/2023, 12:22 PM

## 2023-09-23 NOTE — Progress Notes (Signed)
 Progress Note   Patient: Samuel Burns FMW:[id number] DOB: 01-30-1986 DOA: 09/22/2023     0 DOS: the patient was seen and examined on 09/23/2023   Brief hospital course: Samuel Burns is a 38 y.o. male with medical history significant of end-stage renal disease on hemodialysis Mondays Wednesdays and Fridays, essential hypertension, depression, asthma, anxiety disorder, anemia of chronic disease, ADHD who presented to the ER after dialysis today with syncopal episode.   Assessment and Plan: Syncope: Most likely due to significant hypotension.  Patient is stable at the moment.  Will correct blood pressure.  Monitor patient on telemetry.  Mobilize patient.  May consider PT if persistently symptomatic.   Persistent hypotension: in the setting of multiple antihypertensive medications due to uncontrolled HTN, hemodialysis, possible poor oral intake.   He got gentle IV fluids. Hold all blood pressure medications. Echo ordered. Continue to monitor vitals closely.   End-stage renal disease:  Nephrology consulted for HD needs.   Depression: Resumed zoloft .   Anemia of chronic disease: Hemoglobin is 12 g.  Will continue to monitor     Out of bed to chair. Incentive spirometry. Nursing supportive care. Fall, aspiration precautions. Diet:  Diet Orders (From admission, onward)     Start     Ordered   09/23/23 0014  Diet renal with fluid restriction Fluid restriction: 1200 mL Fluid; Room service appropriate? Yes; Fluid consistency: Thin  Diet effective now       Question Answer Comment  Fluid restriction: 1200 mL Fluid   Room service appropriate? Yes   Fluid consistency: Thin      09/23/23 0013           DVT prophylaxis: heparin  injection 5,000 Units Start: 09/23/23 1400  Level of care: Telemetry Medical   Code Status: Full Code  Subjective: Patient is seen and examined today morning. He is lying in bed, comfortable, did not get out of bed. BP lower side. Advised to work with  PT.  Physical Exam: Vitals:   09/23/23 0015 09/23/23 0542 09/23/23 0822 09/23/23 1701  BP: (!) 98/58 (!) 89/57 95/62 122/69  Pulse: 81 69 75 73  Resp: 19 18 18    Temp: 98.3 F (36.8 C) 98.2 F (36.8 C)  98.2 F (36.8 C)  TempSrc: Oral Oral Oral Oral  SpO2: 100% 100% 99% 100%  Weight: 85.8 kg     Height: 5' 8 (1.727 m)       General - Young African American male, no apparent distress HEENT - PERRLA, EOMI, atraumatic head, non tender sinuses. Lung - Clear, basal rales, no rhonchi, wheezes. Heart - S1, S2 heard, no murmurs, rubs, trace pedal edema. Abdomen - Soft, non tender, bowel sounds good Neuro - Alert, awake and oriented x 3, non focal exam. Skin - Warm and dry.  Data Reviewed:      Latest Ref Rng & Units 09/23/2023    5:23 AM 09/22/2023    8:54 PM 06/24/2023   11:00 AM  CBC  WBC 4.0 - 10.5 K/uL 8.7  14.1    Hemoglobin 13.0 - 17.0 g/dL 89.5  87.9  85.3   Hematocrit 39.0 - 52.0 % 31.7  37.2  43.0   Platelets 150 - 400 K/uL 230  224        Latest Ref Rng & Units 09/23/2023    5:23 AM 09/22/2023    8:54 PM 06/24/2023   11:00 AM  BMP  Glucose 70 - 99 mg/dL  97  78   BUN 6 -  20 mg/dL  28  62   Creatinine 9.38 - 1.24 mg/dL 0.10  1.07  88.19   Sodium 135 - 145 mmol/L  140  135   Potassium 3.5 - 5.1 mmol/L  3.9  5.0   Chloride 98 - 111 mmol/L  96  99   CO2 22 - 32 mmol/L  27    Calcium  8.9 - 10.3 mg/dL  8.5     DG Chest Portable 1 View Result Date: 09/22/2023 CLINICAL DATA:  syncope EXAM: PORTABLE CHEST 1 VIEW COMPARISON:  Chest x-ray 02/06/2022, CT chest 10/23/2021 FINDINGS: Left chest wall dialysis catheter with tip overlying the right atrium. The heart and mediastinal contours are within normal limits. No focal consolidation. No pulmonary edema. No pleural effusion. No pneumothorax. No acute osseous abnormality. IMPRESSION: No active disease. Electronically Signed   By: Morgane  Naveau M.D.   On: 09/22/2023 20:33    Family Communication: Discussed with patient, understand  and agree. All questions answered.  Disposition: Status is: Observation The patient remains OBS appropriate and will d/c before 2 midnights.  Planned Discharge Destination: Home     Time spent: 40 minutes  Author: Concepcion Riser, MD 09/23/2023 6:09 PM Secure chat 7am to 7pm For on call review [email or web address].

## 2023-09-23 NOTE — Consult Note (Signed)
 Renal Service Consult Note Samuel Burns  Samuel Burns 09/23/2023 Lamar JONETTA Fret, MD Requesting Physician: Dr. Darci  Reason for Consult: ESRD w/ hypotension and syncope HPI: The patient is a 38 y.o. year-old w/ PMH as below who presented to ED last night reporting a syncopal episode a few hrs after his usual HD. In ED BP 85/60, lowest was 76/54. HR 81, RR 17, temp 98. He rec'd 1 L NS 0.9% and NS overnight at 50 cc/hr. K+ was 3.9, bun 28, creat 8.9, Hb 10.4, wbc 14k. Trop 35, 32. EKG w/o sig findings. CXR was negative. BP's improved to 90/ 60s after IVF's and midodrine  10mg  x 1. Pt was admitted.  We are asked to see for ESRD.    Pt seen in room. Pt stated that his usual HD time is 10 am- 2:30 pm. He usually goes home and eats something and takes his phosphorous binders. Later in the evening then he takes his pm BP meds (norvasc , metoprolol , clonidine ). Yesterday though he took all his pm meds about 3:30 pm after he got home, along with some food. About 1-2 hrs later he felt faint and then passed out. This has never happened before he said.   Pt states he feels much better today. His bp's are still in the 90s, he says that they drop into the 90s quite a bit at his OP dialysis.   At HD unit, BP's are usually normal on arrival, they drop into the 90s-100s during HD and after HD bp's are around 110/70 or so.    ROS - denies CP, no joint pain, no HA, no blurry vision, no rash, no diarrhea, no nausea/ vomiting   Past Medical History  Past Medical History:  Diagnosis Date   ADHD (attention deficit hyperactivity disorder)    ADHD   Anemia    Anxiety    Asthma    as a child no current problems as adult, no inhaler   Depression    Hypertension    Renal failure    Dialysis M/W/F   Past Surgical History  Past Surgical History:  Procedure Laterality Date   A/V SHUNT INTERVENTION Right 08/26/2023   Procedure: A/V SHUNT INTERVENTION;  Surgeon: Serene Gaile ORN, MD;   Location: [facility];  Service: Cardiovascular;  Laterality: Right;   AV FISTULA PLACEMENT Right 11/24/2019   Procedure: Creation of RIGHT ARM Brachial/Cephalic ARTERIOVENOUS (AV) FISTULA.;  Surgeon: Gretta Lonni PARAS, MD;  Location: [facility];  Service: Vascular;  Laterality: Right;   AV FISTULA PLACEMENT Left 12/22/2021   Procedure: LEFT ARM BRACHIOCEPHALIC ARTERIOVENOUS (AV) FISTULA CREATION;  Surgeon: Gretta Lonni PARAS, MD;  Location: [facility];  Service: Vascular;  Laterality: Left;   AV FISTULA PLACEMENT Left 12/03/2022   Procedure: INSERTION OF LEFT ARM ARTERIOVENOUS (AV) GOR-TEX GRAFT;  Surgeon: Magda Debby SAILOR, MD;  Location: [facility];  Service: Vascular;  Laterality: Left;   AV FISTULA PLACEMENT Right 04/05/2023   Procedure: RIGHT FIRST STAGE BRACHIOBASILIC ARTERIOVENOUS (AV) FISTULA CREATION;  Surgeon: Gretta Lonni PARAS, MD;  Location: [facility];  Service: Vascular;  Laterality: Right;  Regional block   BASCILIC VEIN TRANSPOSITION Left 01/27/2022   Procedure: LEFT FIRST STAGE BASILIC VEIN TRANSPOSITION;  Surgeon: Lanis Fonda BRAVO, MD;  Location: [facility];  Service: Vascular;  Laterality: Left;  PERIPHERAL NERVE BLOCK   BASCILIC VEIN TRANSPOSITION Right 06/24/2023   Procedure: TRANSPOSITION, VEIN, BASILIC;  Surgeon: Magda Debby SAILOR, MD;  Location: [facility];  Service: Vascular;  Laterality: Right;  CAPD INSERTION N/A 09/10/2022   Procedure: LAPAROSCOPIC INSERTION CONTINUOUS AMBULATORY PERITONEAL DIALYSIS  (CAPD) CATHETER;  Surgeon: Magda Debby SAILOR, MD;  Location: [facility];  Service: Vascular;  Laterality: N/A;   CAPD REMOVAL N/A 12/03/2022   Procedure: REMOVAL CONTINUOUS AMBULATORY PERITONEAL DIALYSIS CATHETER;  Surgeon: Magda Debby SAILOR, MD;  Location: [facility];  Service: Vascular;  Laterality: N/A;   IR AV DIALY SHUNT INTRO NEEDLE/INTRACATH INITIAL W/PTA/IMG RIGHT Right 02/11/2021   IR DIALY SHUNT INTRO NEEDLE/INTRACATH INITIAL W/IMG RIGHT Right 04/30/2020   IR FLUORO GUIDE CV LINE LEFT  11/21/2021    IR FLUORO GUIDE CV LINE RIGHT  11/20/2019   IR REMOVAL TUN CV CATH W/O FL  06/04/2020   IR US  GUIDE VASC ACCESS LEFT  11/21/2021   IR US  GUIDE VASC ACCESS RIGHT  11/20/2019   IR US  GUIDE VASC ACCESS RIGHT  04/30/2020   IR US  GUIDE VASC ACCESS RIGHT  02/11/2021   UPPER EXTREMITY VENOGRAPHY Bilateral 02/04/2023   Procedure: UPPER EXTREMITY VENOGRAPHY;  Surgeon: Gretta Lonni PARAS, MD;  [facility]: [facility];  Service: Cardiovascular;  Laterality: Bilateral;   VENOUS ANGIOPLASTY  08/26/2023   Procedure: VENOUS ANGIOPLASTY;  Surgeon: Serene Gaile ORN, MD;  Location: [facility];  Service: Cardiovascular;;  Basilic Vein   Family History  Family History  Problem Relation Age of Onset   Hypertension Maternal Grandmother    Kidney disease Father    Social History  reports that he has never smoked. He has never been exposed to tobacco smoke. He has never used smokeless tobacco. He reports current drug use. Drug: Marijuana. He reports that he does not drink alcohol. Allergies No Known Allergies Home medications Prior to Admission medications   Medication Sig Start Date End Date Taking? Authorizing Provider  acetaminophen  (TYLENOL ) 500 MG tablet Take 500-1,000 mg by mouth every 6 (six) hours as needed for moderate pain (pain score 4-6) or headache.   Yes [provider]  amLODipine  (NORVASC ) 10 MG tablet Take 1 tablet (10 mg total) by mouth at bedtime. 12/09/21  Yes Fleming, Zelda W, NP  BIOTIN PO Take 500 mg by mouth at bedtime.   Yes [provider]  cloNIDine  (CATAPRES ) 0.1 MG tablet Take 1 tablet (0.1 mg total) by mouth 2 (two) times daily. 08/03/23  Yes Vicci Barnie NOVAK, MD  fluticasone  (FLONASE ) 50 MCG/ACT nasal spray Place 2 sprays into both nostrils daily. Patient taking differently: Place 2 sprays into both nostrils daily as needed for allergies. 05/13/21  Yes Gladis Elsie BROCKS, PA-C  hydrOXYzine  (VISTARIL ) 25 MG capsule Take 1 capsule (25 mg total) by mouth in the  morning and at bedtime. 08/03/23  Yes Vicci Barnie NOVAK, MD  lactulose, encephalopathy, (CHRONULAC) 10 GM/15ML SOLN Take 10-20 g by mouth daily as needed (Constipation). 09/15/22  Yes [provider]  losartan  (COZAAR ) 50 MG tablet Take 1 tablet (50 mg total) by mouth in the morning. 01/22/23  Yes Vicci Barnie NOVAK, MD  Metoprolol  Tartrate 75 MG TABS Take 1 tablet (75 mg total) by mouth 2 (two) times daily. 05/01/23 04/30/24 Yes Vicci Barnie NOVAK, MD  Naphazoline HCl (CLEAR EYES OP) Place 2 drops into both eyes every other day. Instill two drops per eye every other day in the morning and at bedtime as needed for dry eyes.   Yes [provider]  Polyethylene Glycol 400 0.25 % SOLN Place 2 drops into both eyes daily as needed (Dry eyes). Instill two drops in each eye daily as  needed for dry eyes.   Yes [provider]  sertraline  (ZOLOFT ) 100 MG tablet Take 1 tablet (100 mg total) by mouth in the morning. 09/03/22  Yes Vicci Barnie NOVAK, MD  sildenafil  (VIAGRA ) 50 MG tablet 1 tab PO 1/2 hr prior to sex PRN.  Limit use to 1 tab/24 hr Patient taking differently: Take 50 mg by mouth as needed for erectile dysfunction. Take one tablet by mouth at least 30 mins prior to intercourse as needed. 07/30/20  Yes Vicci Barnie NOVAK, MD  sucroferric oxyhydroxide (VELPHORO ) 500 MG chewable tablet Chew 500 mg by mouth 3 (three) times daily with meals. Chew one tablet by mouth three time per day with meals. 10/22/21  Yes [provider]     Vitals:   09/22/23 2343 09/23/23 0015 09/23/23 0542 09/23/23 0822  BP: 97/63 (!) 98/58 (!) 89/57 95/62  Pulse: 87 81 69 75  Resp: (!) 23 19 18 18   Temp: 98.5 F (36.9 C) 98.3 F (36.8 C) 98.2 F (36.8 C)   TempSrc: Oral Oral Oral Oral  SpO2: 100% 100% 100% 99%  Weight:  85.8 kg    Height:  5' 8 (1.727 m)     Exam Gen alert, no distress No rash, cyanosis or gangrene Sclera anicteric, throat clear  No jvd or bruits Chest clear bilat to  bases, no rales/ wheezing RRR no MRG Abd soft ntnd no mass or ascites +bs GU deferred MS no joint effusions or deformity Ext no LE or UE edema, no other edema Neuro is alert, Ox 3 , nf    RUA AVF no bruit / LIJ TDC  Home bp meds: Norvasc  10mg  qpm Clonidine  0.1mg  bid, hold am dose on hd days Losartan  50mg  qam, hold on hd days Metoprolol  75mg  bid, hold am dose on hd days   OP HD: MWF [facility] 4h  B300   82kg   2K bath  LIJ TDC  Heparin  5000 + 2500 midrun Last OP HD 8/06, post HD 83.2kg  Mircera 150 q 2 wks Rocaltrol  2.0 mcg po three times per week   Assessment/ Plan: Syncope/ hypotension: likely due to taking pm meds do early (3:30 pm vs usual 8-9 pm) after dialysis yesterday. But also his hypotension is persisting here. He is not symptomatic but not sure the cause. Looks euvolemic. Doesn't appear to be gaining body wt. Will get am cortisol. Will order prn midodrine  10 tid if SBP < 100.  ESRD: on HD MWF. HD tomorrow. HTN: is on 4 bp lowering meds at home, all being held for now.  Volume: euvolemic on exam. Up 2-3 kg by wts. CXR clear. Minimal UF depending on BP's tomorrow.  Anemia of esrd: Hb 10-13 here, follow.   Myer Fret  MD [facility] 09/23/2023, 4:23 PM  Recent Labs  Lab 09/22/23 2054 09/23/23 0523  HGB 12.0* 10.4*  ALBUMIN 4.2  --   CALCIUM  8.5*  --   CREATININE 8.92* 9.89*  K 3.9  --    Inpatient medications:  heparin   5,000 Units Subcutaneous Q8H    sodium chloride  50 mL/hr at 09/23/23 0059   acetaminophen  **OR** acetaminophen , calcium  carbonate (dosed in mg elemental calcium ), camphor-menthol  **AND** hydrOXYzine , docusate sodium , feeding supplement (NEPRO CARB STEADY), ondansetron  **OR** ondansetron  (ZOFRAN ) IV, mouth rinse, sorbitol 

## 2023-09-24 ENCOUNTER — Observation Stay (HOSPITAL_COMMUNITY)

## 2023-09-24 DIAGNOSIS — Z992 Dependence on renal dialysis: Secondary | ICD-10-CM | POA: Diagnosis not present

## 2023-09-24 DIAGNOSIS — F320 Major depressive disorder, single episode, mild: Secondary | ICD-10-CM | POA: Diagnosis not present

## 2023-09-24 DIAGNOSIS — D638 Anemia in other chronic diseases classified elsewhere: Secondary | ICD-10-CM | POA: Diagnosis not present

## 2023-09-24 DIAGNOSIS — N186 End stage renal disease: Secondary | ICD-10-CM | POA: Diagnosis not present

## 2023-09-24 DIAGNOSIS — I953 Hypotension of hemodialysis: Secondary | ICD-10-CM | POA: Diagnosis not present

## 2023-09-24 LAB — RENAL FUNCTION PANEL
Albumin: 3.5 g/dL (ref 3.5–5.0)
Anion gap: 15 (ref 5–15)
BUN: 52 mg/dL — ABNORMAL HIGH (ref 6–20)
CO2: 25 mmol/L (ref 22–32)
Calcium: 8.6 mg/dL — ABNORMAL LOW (ref 8.9–10.3)
Chloride: 101 mmol/L (ref 98–111)
Creatinine, Ser: 12.66 mg/dL — ABNORMAL HIGH (ref 0.61–1.24)
GFR, Estimated: 5 mL/min — ABNORMAL LOW (ref >60)
Glucose, Bld: 111 mg/dL — ABNORMAL HIGH (ref 70–99)
Phosphorus: 4.7 mg/dL — ABNORMAL HIGH (ref 2.5–4.6)
Potassium: 4.1 mmol/L (ref 3.5–5.1)
Sodium: 141 mmol/L (ref 135–145)

## 2023-09-24 LAB — CBC
HCT: 31.1 % — ABNORMAL LOW (ref 39.0–52.0)
Hemoglobin: 9.9 g/dL — ABNORMAL LOW (ref 13.0–17.0)
MCH: 30.4 pg (ref 26.0–34.0)
MCHC: 31.8 g/dL (ref 30.0–36.0)
MCV: 95.4 fL (ref 80.0–100.0)
Platelets: 241 K/uL (ref 150–400)
RBC: 3.26 MIL/uL — ABNORMAL LOW (ref 4.22–5.81)
RDW: 14.8 % (ref 11.5–15.5)
WBC: 7.9 K/uL (ref 4.0–10.5)
nRBC: 0 % (ref 0.0–0.2)

## 2023-09-24 LAB — CORTISOL-AM, BLOOD: Cortisol - AM: 14.4 ug/dL (ref 6.7–22.6)

## 2023-09-24 LAB — HEPATITIS B SURFACE ANTIBODY, QUANTITATIVE: Hep B S AB Quant (Post): 311 m[IU]/mL

## 2023-09-24 MED ORDER — HEPARIN SODIUM (PORCINE) 1000 UNIT/ML IJ SOLN
INTRAMUSCULAR | Status: AC
  Filled 2023-09-24: qty 8

## 2023-09-24 MED ORDER — HEPARIN SODIUM (PORCINE) 1000 UNIT/ML IJ SOLN
INTRAMUSCULAR | Status: AC
Start: 2023-09-24 — End: 2023-09-24
  Filled 2023-09-24: qty 1

## 2023-09-24 MED ORDER — MIDODRINE HCL 10 MG PO TABS: 10.0000 mg | ORAL_TABLET | Freq: Three times a day (TID) | ORAL | 0 refills | Status: AC | PRN

## 2023-09-24 NOTE — Plan of Care (Signed)

## 2023-09-24 NOTE — Progress Notes (Addendum)
 Pt receives OP HD at [facility], MWF 0940. Will continue to assist as needed.   Lavanda Trigger Frasier Dialysis Navigator [phone]  Addendum 2:09 D/c orders noted. Contacted clinic to inform of pt d/c and arrival Monday.

## 2023-09-24 NOTE — Progress Notes (Signed)
 DISCHARGE NOTE HOME Samuel Burns to be discharged Home per MD order. Discussed prescriptions and follow up appointments with the patient. Prescriptions given to patient; medication list explained in detail. Patient verbalized understanding.  Skin clean, dry and intact without evidence of skin break down, no evidence of skin tears noted. IV catheter discontinued intact. Site without signs and symptoms of complications. Dressing and pressure applied. Pt denies pain at the site currently. No complaints noted.  Patient free of lines, drains, and wounds.   An After Visit Summary (AVS) was printed and given to the patient. Patient escorted via wheelchair, and discharged home via private auto.  Doyal Sias, RN

## 2023-09-24 NOTE — Progress Notes (Signed)
 [facility] Progress Note   Subjective:    Seen and examined patient on HD. Tolerating UFG 1L. BP is 122/83. Discussed with Primary. Plan for discharge after treatment today.  Objective Vitals:   09/24/23 0955 09/24/23 1030 09/24/23 1100 09/24/23 1130  BP: 122/84 123/89 (!) 120/95 (!) 124/90  Pulse: 75 80 77 78  Resp: 13 14 11 14   Temp:      TempSrc:      SpO2: 100% 96% 100% 100%  Weight:      Height:       Physical Exam General: Awake, alert, NAD Heart: RRR; No murmurs Lungs: Clear throughout Abdomen: Soft and non-tender Extremities: No LE edema Dialysis Access: [facility]   Filed Weights   09/22/23 1951 09/23/23 0015 09/24/23 0805  Weight: 82.1 kg 85.8 kg 89.5 kg    Intake/Output Summary (Last 24 hours) at 09/24/2023 1136 Last data filed at 09/23/2023 2200 Gross per 24 hour  Intake 480 ml  Output --  Net 480 ml    Additional Objective Labs: Basic Metabolic Panel: Recent Labs  Lab 09/22/23 2054 09/23/23 0523 09/24/23 0820  NA 140  --  141  K 3.9  --  4.1  CL 96*  --  101  CO2 27  --  25  GLUCOSE 97  --  111*  BUN 28*  --  52*  CREATININE 8.92* 9.89* 12.66*  CALCIUM  8.5*  --  8.6*  PHOS  --   --  4.7*   Liver Function Tests: Recent Labs  Lab 09/22/23 2054 09/24/23 0820  AST 14*  --   ALT 12  --   ALKPHOS 184*  --   BILITOT 0.9  --   PROT 8.3*  --   ALBUMIN 4.2 3.5   No results for input(s): LIPASE, AMYLASE in the last 168 hours. CBC: Recent Labs  Lab 09/22/23 2054 09/23/23 0523 09/24/23 0820  WBC 14.1* 8.7 7.9  NEUTROABS 11.9*  --   --   HGB 12.0* 10.4* 9.9*  HCT 37.2* 31.7* 31.1*  MCV 95.6 95.5 95.4  PLT 224 230 241   Blood Culture    Component Value Date/Time   SDES BLOOD BLOOD LEFT HAND 08/01/2022 1130   SPECREQUEST  08/01/2022 1130    BOTTLES DRAWN AEROBIC AND ANAEROBIC Blood Culture adequate volume   CULT  08/01/2022 1130    NO GROWTH 5 DAYS Performed at [facility], [address], [place],  [place] [address]    REPTSTATUS 08/06/2022 FINAL 08/01/2022 1130    Cardiac Enzymes: No results for input(s): CKTOTAL, CKMB, CKMBINDEX, TROPONINI in the last 168 hours. CBG: Recent Labs  Lab 09/22/23 2029  GLUCAP 80   Iron Studies: No results for input(s): IRON, TIBC, TRANSFERRIN, FERRITIN in the last 72 hours. Lab Results  Component Value Date   INR 1.1 11/20/2019   Studies/Results: DG Chest Portable 1 View Result Date: 09/22/2023 CLINICAL DATA:  syncope EXAM: PORTABLE CHEST 1 VIEW COMPARISON:  Chest x-ray 02/06/2022, CT chest 10/23/2021 FINDINGS: Left chest wall dialysis catheter with tip overlying the right atrium. The heart and mediastinal contours are within normal limits. No focal consolidation. No pulmonary edema. No pleural effusion. No pneumothorax. No acute osseous abnormality. IMPRESSION: No active disease. Electronically Signed   By: Morgane  Naveau M.D.   On: 09/22/2023 20:33    Medications:   Chlorhexidine  Gluconate Cloth  6 each Topical Q0600   heparin   5,000 Units Subcutaneous Q8H   midodrine   10 mg Oral  TID WC   sertraline   100 mg Oral q AM    Dialysis Orders: MWF [facility] 4h  B300   82kg   2K bath  LIJ TDC  Heparin  5000 + 2500 midrun Last OP HD 8/06, post HD 83.2kg  Mircera 150 q 2 wks Rocaltrol  2.0 mcg po three times per week  Assessment/Plan: Syncope/ hypotension: likely due to taking pm meds do early (3:30 pm vs usual 8-9 pm) after dialysis yesterday. But also his hypotension is persisting here. He is not symptomatic but not sure the cause. Looks euvolemic. Doesn't appear to be gaining body wt. Will get am cortisol. Discussed with Primary: plan to dc his home BP meds for now and send a Rx for Midodrine  TID PRN. We can re-introduce his BP meds in outpatient one at a time if needed. Will also place holding parameters on BP meds if we were to restart in outpatient (hold for SBP < 100).  ESRD: on HD MWF. On HD. Next HD 8/11 in outpatient. HTN: is on 4 bp  lowering meds at home, all being held for now. See above Volume: euvolemic on exam. Up 2-3 kg by wts. CXR clear. Minimal UF depending on BP's tomorrow.  Anemia of esrd: Hb 10-13 here, follow.  Dispo - Okay for discharge from a renal standpoint. See above on plan for HTN management in outpatient.  Charmaine Piety, NP [facility] 09/24/2023,11:36 AM  LOS: 0 days

## 2023-09-24 NOTE — Discharge Summary (Signed)
 Physician Discharge Summary   Patient: Samuel Burns MRN: [id number] DOB: May 06, 1985  Admit date:     09/22/2023  Discharge date: 09/24/23  Discharge Physician: Concepcion Riser   PCP: Vicci Barnie NOVAK, MD   Recommendations at discharge:    PCP follow up in 1 week. Monitor BP for adjustment for his antihypertensive meds which are now stopped due to low BP. Nephrology follow up and HD as scheduled.  Discharge Diagnoses: Principal Problem:   Hypotension Active Problems:   ESRD (end stage renal disease) on dialysis (HCC)   Mild major depression (HCC)   Essential hypertension   Anemia of chronic disease  Resolved Problems:   * No resolved hospital problems. *  Hospital Course: Karson Wagoner is a 38 y.o. male with medical history significant of end-stage renal disease on hemodialysis Mondays Wednesdays and Fridays, essential hypertension, depression, asthma, anxiety disorder, anemia of chronic disease, ADHD who presented to the ER after dialysis today with syncopal episode.    Assessment and Plan: Syncope Persistent hypotension- Most likely due to significant hypotension.   Telemetry uneventful.  Patient's blood pressure stopped, blood pressure improved.  He is able to ambulate, denies any complaints.  Wishes to go home. Patient was taking Norvasc , metoprolol , clonidine , losartan  as previously his blood pressures were very high.  Monitoring tapered off outpatient. Nephrology advised to stop all blood pressure medications, start midodrine  3 times daily as needed for low blood pressures. I advised him to follow-up with PCP, nephrology for further management of his blood pressures.  Advised to keep a log of blood pressures. He understands and agrees with the discharge plan.  End-stage renal disease:  Continue hemodialysis Monday Wednesday Friday as per schedule. He had HD today. Nephrology follow-up suggested   Depression: Resumed zoloft .   Anemia of chronic disease:  Hemoglobin is 12 stable.      Consultants: Nephrology Procedures performed: None Disposition: Home Diet recommendation:  Discharge Diet Orders (From admission, onward)     Start     Ordered   09/24/23 0000  Diet - low sodium heart healthy        09/24/23 1241           Renal diet DISCHARGE MEDICATION: Allergies as of 09/24/2023   No Known Allergies      Medication List     STOP taking these medications    amLODipine  10 MG tablet Commonly known as: NORVASC    cloNIDine  0.1 MG tablet Commonly known as: CATAPRES    losartan  50 MG tablet Commonly known as: COZAAR    Metoprolol  Tartrate 75 MG Tabs   sildenafil  50 MG tablet Commonly known as: Viagra        TAKE these medications    acetaminophen  500 MG tablet Commonly known as: TYLENOL  Take 500-1,000 mg by mouth every 6 (six) hours as needed for moderate pain (pain score 4-6) or headache.   BIOTIN PO Take 500 mg by mouth at bedtime.   CLEAR EYES OP Place 2 drops into both eyes every other day. Instill two drops per eye every other day in the morning and at bedtime as needed for dry eyes.   fluticasone  50 MCG/ACT nasal spray Commonly known as: FLONASE  Place 2 sprays into both nostrils daily. What changed:  when to take this reasons to take this   hydrOXYzine  25 MG capsule Commonly known as: VISTARIL  Take 1 capsule (25 mg total) by mouth in the morning and at bedtime.   lactulose (encephalopathy) 10 GM/15ML Soln Commonly known as: CHRONULAC Take 10-20  g by mouth daily as needed (Constipation).   midodrine  10 MG tablet Commonly known as: PROAMATINE  Take 1 tablet (10 mg total) by mouth 3 (three) times daily as needed (SBP<110).   Polyethylene Glycol 400 0.25 % Soln Place 2 drops into both eyes daily as needed (Dry eyes). Instill two drops in each eye daily as needed for dry eyes.   sertraline  100 MG tablet Commonly known as: ZOLOFT  Take 1 tablet (100 mg total) by mouth in the morning.    sucroferric oxyhydroxide 500 MG chewable tablet Commonly known as: VELPHORO  Chew 500 mg by mouth 3 (three) times daily with meals. Chew one tablet by mouth three time per day with meals.        Discharge Exam: Filed Weights   09/22/23 1951 09/23/23 0015 09/24/23 0805  Weight: 82.1 kg 85.8 kg 89.5 kg      09/24/2023   12:44 PM 09/24/2023   12:11 PM 09/24/2023   12:10 PM  Vitals with BMI  Systolic 137 122 872  Diastolic 90 88 96  Pulse 90 81 81    General - Young [place] American male, no apparent distress HEENT - PERRLA, EOMI, atraumatic head, non tender sinuses. Lung - Clear, basal rales, no rhonchi, wheezes. Heart - S1, S2 heard, no murmurs, rubs, trace pedal edema. Abdomen - Soft, non tender, bowel sounds good Neuro - Alert, awake and oriented x 3, non focal exam. Skin - Warm and dry.  Condition at discharge: stable  The results of significant diagnostics from this hospitalization (including imaging, microbiology, ancillary and laboratory) are listed below for reference.   Imaging Studies: DG Chest Portable 1 View Result Date: 09/22/2023 CLINICAL DATA:  syncope EXAM: PORTABLE CHEST 1 VIEW COMPARISON:  Chest x-ray 02/06/2022, CT chest 10/23/2021 FINDINGS: Left chest wall dialysis catheter with tip overlying the right atrium. The heart and mediastinal contours are within normal limits. No focal consolidation. No pulmonary edema. No pleural effusion. No pneumothorax. No acute osseous abnormality. IMPRESSION: No active disease. Electronically Signed   By: Morgane  Naveau M.D.   On: 09/22/2023 20:33   PERIPHERAL VASCULAR CATHETERIZATION Result Date: 08/26/2023 Images from the original result were not included. Patient name: Samuel Burns MRN: [id number] DOB: 1985-10-17 Sex: male 08/26/2023 Pre-operative Diagnosis: ESRD Post-operative diagnosis:  Same Surgeon:  Malvina New Procedure Performed:  1.  Ultrasound-guided access, right basilic vein  2.  Fistulogram  3.  Peripheral  venoplasty, right basilic vein Indications: This is a 38 year old gentleman with a recently created basilic vein fistula on the right.  Ultrasound identified an area of stenosis.  He is here for further evaluation. Procedure:  The patient was identified in the holding area and taken to room 8.  The patient was then placed supine on the table and prepped and draped in the usual sterile fashion.  A time out was called. Ultrasound was used to evaluate the fistula.  The vein was patent and compressible.  A digital ultrasound image was acquired.  The fistula was then accessed under ultrasound guidance using a micropuncture needle.  An 018 wire was then asvanced without resistance and a micropuncture sheath was placed.  Contrast injections were then performed through the sheath. Findings: No evidence of central venous stenosis.  The basilic vein near the arterial anastomosis is small in caliber however no significant stenosis is identified.  There is a focal tandem greater than 80% stenosis in the basilic vein in the upper arm.  Intervention: After the above images were acquired the decision was  made to proceed with intervention.  A 6 French sheath was placed over a Bentson wire.  I first selected a 5 x 40 Mustang balloon to perform balloon venoplasty of the stenosis at nominal pressure.  Follow-up imaging revealed improved but suboptimal results and so I upsized to a 7 x 40 balloon.  This was taken to 18 atm for 1 minute.  Follow-up imaging revealed significant improvement in the stenosis, now less than 15%.  I elected to stop at this time.  The access site was removed and a Monocryl was used for closure. Impression:  #1  Successful balloon venoplasty of tandem greater than 80% stenosis within the basilic vein with less than 50% residual stenosis  #2  Fistula is ready for immediate use  #3  Access remains amenable to percutaneous interventions in the future.  ALONSO Malvina New, M.D., FACS [facility] Office: [phone] Pager:  [phone]    Microbiology: Results for orders placed or performed during the hospital encounter of 09/22/23  MRSA Next Gen by PCR, Nasal     Status: None   Collection Time: 09/23/23 12:46 AM   Specimen: Nasal Mucosa; Nasal Swab  Result Value Ref Range Status   MRSA by PCR Next Gen NOT DETECTED NOT DETECTED Final    Comment: (NOTE) The GeneXpert MRSA Assay (FDA approved for NASAL specimens only), is one component of a comprehensive MRSA colonization surveillance program. It is not intended to diagnose MRSA infection nor to guide or monitor treatment for MRSA infections. Test performance is not FDA approved in patients less than 41 years old. Performed at [facility], [address] [place], [place] [address]     Labs: CBC: Recent Labs  Lab 09/22/23 2054 09/23/23 0523 09/24/23 0820  WBC 14.1* 8.7 7.9  NEUTROABS 11.9*  --   --   HGB 12.0* 10.4* 9.9*  HCT 37.2* 31.7* 31.1*  MCV 95.6 95.5 95.4  PLT 224 230 241   Basic Metabolic Panel: Recent Labs  Lab 09/22/23 2054 09/23/23 0523 09/24/23 0820  NA 140  --  141  K 3.9  --  4.1  CL 96*  --  101  CO2 27  --  25  GLUCOSE 97  --  111*  BUN 28*  --  52*  CREATININE 8.92* 9.89* 12.66*  CALCIUM  8.5*  --  8.6*  PHOS  --   --  4.7*   Liver Function Tests: Recent Labs  Lab 09/22/23 2054 09/24/23 0820  AST 14*  --   ALT 12  --   ALKPHOS 184*  --   BILITOT 0.9  --   PROT 8.3*  --   ALBUMIN 4.2 3.5   CBG: Recent Labs  Lab 09/22/23 2029  GLUCAP 80    Discharge time spent: 34 minutes.  Signed: Concepcion Riser, MD [facility] 09/24/2023

## 2023-09-25 ENCOUNTER — Other Ambulatory Visit (HOSPITAL_COMMUNITY): Payer: Self-pay

## 2023-09-25 NOTE — TOC Transition Note (Signed)
 Transition of Care - Initial Contact after Hospitalization  Date of discharge: 09/24/2023  Date of contact: 09/25/23  Method: Phone Spoke to: Patient  Patient contacted to discuss transition of care from recent inpatient hospitalization but he didn't answer the phone. Left a VM advising to reach back out to [facility] HD unit [phone] for any questions or concerns.   Patient will return to his outpatient HD unit on: Monday 8/11 at [facility].  Charmaine Piety, NP

## 2023-09-25 NOTE — Discharge Planning (Signed)
 [facility] Patient Discharge Orders- [facility] CLINIC: [facility]  Patient's name: Samuel Burns Admit/DC Dates: 09/22/2023 - 09/24/2023  Discharge Diagnoses: Syncope/persistent hypotension- See below for detailed plan    Aranesp : Given: No    Last Hgb: 9.9 PRBC's Given: No ESA dose for discharge: resume mircera 30 mcg IV q 4 weeks  IV Iron dose at discharge: N/A  Heparin  change: No  EDW Change: No  Bath Change: No  Access intervention/Change: No   Calcitriol  change: No  Discharge Labs: Calcium  8.6  Phosphorus 4.7  Albumin 3.5  K+ 4.1  IV Antibiotics: No  On Coumadin?: No    OTHER/APPTS/LAB ORDERS: Attn renal team: Patient admitted for syncope and persistent hypotension. Likely 2nd when he took his BP meds right after HD at his last tx in outpatient. His home BP meds have been d/c'd for now and a Rx for Midodrine  TID PRN has been sent. Watch his Bps on HD at [facility]. We can re-introduce his BP meds in outpatient one at a time if needed. Consider placing holding parameters on BP meds if we were to restart in outpatient (hold for SBP < 100).    D/C Meds to be reconciled by nurse after every discharge.  Completed By: Charmaine Piety, NP   Reviewed by: MD:______ RN_______

## 2023-09-26 ENCOUNTER — Other Ambulatory Visit: Payer: Self-pay | Admitting: Internal Medicine

## 2023-09-26 DIAGNOSIS — F330 Major depressive disorder, recurrent, mild: Secondary | ICD-10-CM

## 2023-09-26 DIAGNOSIS — F411 Generalized anxiety disorder: Secondary | ICD-10-CM

## 2023-09-26 NOTE — TOC Transition Note (Signed)
 Transition of Care - Initial Contact after Hospitalization  Date of discharge: 09/24/2023  Date of contact: 09/26/23  Method: Attempted Phone Call (2nd) Spoke to: No answer  Patient contacted to discuss transition of care from recent inpatient hospitalization but he didn't pick up the phone.   Patient will return to his outpatient HD unit on: 09/27/23 at [facility].  Charmaine Piety, NP

## 2023-09-27 ENCOUNTER — Encounter (HOSPITAL_COMMUNITY)
Admission: RE | Disposition: A | Discharge status: Home / Self Care | Payer: Self-pay | Source: Home / Self Care | Attending: Vascular Surgery

## 2023-09-27 ENCOUNTER — Ambulatory Visit (HOSPITAL_COMMUNITY)
Admission: RE | Admit: 2023-09-27 | Discharge: 2023-09-27 | Disposition: A | Discharge status: Home / Self Care | Attending: Vascular Surgery | Admitting: Vascular Surgery

## 2023-09-27 ENCOUNTER — Other Ambulatory Visit: Payer: Self-pay

## 2023-09-27 ENCOUNTER — Telehealth: Payer: Self-pay

## 2023-09-27 DIAGNOSIS — I959 Hypotension, unspecified: Secondary | ICD-10-CM | POA: Diagnosis not present

## 2023-09-27 DIAGNOSIS — T82858A Stenosis of vascular prosthetic devices, implants and grafts, initial encounter: Secondary | ICD-10-CM | POA: Insufficient documentation

## 2023-09-27 DIAGNOSIS — Z4800 Encounter for change or removal of nonsurgical wound dressing: Secondary | ICD-10-CM | POA: Diagnosis not present

## 2023-09-27 DIAGNOSIS — N186 End stage renal disease: Secondary | ICD-10-CM | POA: Diagnosis not present

## 2023-09-27 DIAGNOSIS — I120 Hypertensive chronic kidney disease with stage 5 chronic kidney disease or end stage renal disease: Secondary | ICD-10-CM | POA: Diagnosis not present

## 2023-09-27 DIAGNOSIS — Y832 Surgical operation with anastomosis, bypass or graft as the cause of abnormal reaction of the patient, or of later complication, without mention of misadventure at the time of the procedure: Secondary | ICD-10-CM | POA: Diagnosis not present

## 2023-09-27 DIAGNOSIS — N2581 Secondary hyperparathyroidism of renal origin: Secondary | ICD-10-CM | POA: Diagnosis not present

## 2023-09-27 DIAGNOSIS — Z992 Dependence on renal dialysis: Secondary | ICD-10-CM | POA: Insufficient documentation

## 2023-09-27 DIAGNOSIS — D631 Anemia in chronic kidney disease: Secondary | ICD-10-CM | POA: Diagnosis not present

## 2023-09-27 DIAGNOSIS — D689 Coagulation defect, unspecified: Secondary | ICD-10-CM | POA: Diagnosis not present

## 2023-09-27 HISTORY — PX: A/V SHUNT INTERVENTION: CATH118220

## 2023-09-27 HISTORY — PX: VENOUS ANGIOPLASTY: CATH118376

## 2023-09-27 SURGERY — A/V SHUNT INTERVENTION
Anesthesia: LOCAL | Site: Arm Upper | Laterality: Right

## 2023-09-27 MED ORDER — HEPARIN (PORCINE) IN NACL 1000-0.9 UT/500ML-% IV SOLN
INTRAVENOUS | Status: DC | PRN
  Administered 2023-09-27 (×2): 500 mL

## 2023-09-27 MED ORDER — LIDOCAINE HCL (PF) 1 % IJ SOLN
INTRAMUSCULAR | Status: DC | PRN
  Administered 2023-09-27 (×2): 5 mL via INTRADERMAL

## 2023-09-27 MED ORDER — LIDOCAINE HCL (PF) 1 % IJ SOLN
INTRAMUSCULAR | Status: AC
  Filled 2023-09-27: qty 30

## 2023-09-27 SURGICAL SUPPLY — 12 items
BALLOON ATHLETIS 8X30X75 (BALLOONS) IMPLANT
BALLOON MUSTANG 8X20X75 (BALLOONS) IMPLANT
BALLOON MUSTANG 8X60X135 (BALLOONS) IMPLANT
BALLOON MUSTANG 8X60X75 (BALLOONS) IMPLANT
KIT ENCORE 26 ADVANTAGE (KITS) IMPLANT
KIT MICROPUNCTURE NIT STIFF (SHEATH) IMPLANT
KIT PV (KITS) ×2 IMPLANT
SHEATH PINNACLE R/O II 6F 4CM (SHEATH) IMPLANT
SHEATH PROBE COVER 6X72 (BAG) IMPLANT
TRAY PV CATH (CUSTOM PROCEDURE TRAY) ×2 IMPLANT
TUBING CIL FLEX 10 FLL-RA (TUBING) IMPLANT
WIRE BENTSON .035X145CM (WIRE) IMPLANT

## 2023-09-27 NOTE — Transitions of Care (Post Inpatient/ED Visit) (Signed)
   09/27/2023  Name: Samuel Burns MRN: [id number] DOB: [birth year]  Today's TOC FU Call Status: Today's TOC FU Call Status:: Unsuccessful Call (1st Attempt) Unsuccessful Call (1st Attempt) Date: 09/27/23  Attempted to reach the patient regarding the most recent Inpatient/ED visit.  Follow Up Plan: Additional outreach attempts will be made to reach the patient to complete the Transitions of Care (Post Inpatient/ED visit) call.   Signature  Slater Diesel, RN

## 2023-09-27 NOTE — H&P (Signed)
 HD ACCESS CENTER H&P   Patient ID: Samuel Burns, male   DOB: March 05, 1985, 38 y.o.   MRN: [id number]  Subjective:     HPI Samuel Burns is a 38 y.o. male with ESRD presenting to the HD access center for intervention.  Past Medical History:  Diagnosis Date   ADHD (attention deficit hyperactivity disorder)    ADHD   Anemia    Anxiety    Asthma    as a child no current problems as adult, no inhaler   Depression    Hypertension    Renal failure    Dialysis M/W/F   Family History  Problem Relation Age of Onset   Hypertension Maternal Grandmother    Kidney disease Father    Past Surgical History:  Procedure Laterality Date   A/V SHUNT INTERVENTION Right 08/26/2023   Procedure: A/V SHUNT INTERVENTION;  Surgeon: Serene Gaile ORN, MD;  Location: [facility];  Service: Cardiovascular;  Laterality: Right;   AV FISTULA PLACEMENT Right 11/24/2019   Procedure: Creation of RIGHT ARM Brachial/Cephalic ARTERIOVENOUS (AV) FISTULA.;  Surgeon: Gretta Lonni PARAS, MD;  Location: [facility] OR;  Service: Vascular;  Laterality: Right;   AV FISTULA PLACEMENT Left 12/22/2021   Procedure: LEFT ARM BRACHIOCEPHALIC ARTERIOVENOUS (AV) FISTULA CREATION;  Surgeon: Gretta Lonni PARAS, MD;  Location: [facility] OR;  Service: Vascular;  Laterality: Left;   AV FISTULA PLACEMENT Left 12/03/2022   Procedure: INSERTION OF LEFT ARM ARTERIOVENOUS (AV) GOR-TEX GRAFT;  Surgeon: Magda Debby SAILOR, MD;  Location: [facility] OR;  Service: Vascular;  Laterality: Left;   AV FISTULA PLACEMENT Right 04/05/2023   Procedure: RIGHT FIRST STAGE BRACHIOBASILIC ARTERIOVENOUS (AV) FISTULA CREATION;  Surgeon: Gretta Lonni PARAS, MD;  Location: [facility] OR;  Service: Vascular;  Laterality: Right;  Regional block   BASCILIC VEIN TRANSPOSITION Left 01/27/2022   Procedure: LEFT FIRST STAGE BASILIC VEIN TRANSPOSITION;  Surgeon: Lanis Fonda BRAVO, MD;  Location: [facility] OR;  Service: Vascular;  Laterality: Left;  PERIPHERAL NERVE BLOCK   BASCILIC VEIN TRANSPOSITION  Right 06/24/2023   Procedure: TRANSPOSITION, VEIN, BASILIC;  Surgeon: Magda Debby SAILOR, MD;  Location: [facility];  Service: Vascular;  Laterality: Right;   CAPD INSERTION N/A 09/10/2022   Procedure: LAPAROSCOPIC INSERTION CONTINUOUS AMBULATORY PERITONEAL DIALYSIS  (CAPD) CATHETER;  Surgeon: Magda Debby SAILOR, MD;  Location: [facility];  Service: Vascular;  Laterality: N/A;   CAPD REMOVAL N/A 12/03/2022   Procedure: REMOVAL CONTINUOUS AMBULATORY PERITONEAL DIALYSIS CATHETER;  Surgeon: Magda Debby SAILOR, MD;  Location: [facility];  Service: Vascular;  Laterality: N/A;   IR AV DIALY SHUNT INTRO NEEDLE/INTRACATH INITIAL W/PTA/IMG RIGHT Right 02/11/2021   IR DIALY SHUNT INTRO NEEDLE/INTRACATH INITIAL W/IMG RIGHT Right 04/30/2020   IR FLUORO GUIDE CV LINE LEFT  11/21/2021   IR FLUORO GUIDE CV LINE RIGHT  11/20/2019   IR REMOVAL TUN CV CATH W/O FL  06/04/2020   IR US  GUIDE VASC ACCESS LEFT  11/21/2021   IR US  GUIDE VASC ACCESS RIGHT  11/20/2019   IR US  GUIDE VASC ACCESS RIGHT  04/30/2020   IR US  GUIDE VASC ACCESS RIGHT  02/11/2021   UPPER EXTREMITY VENOGRAPHY Bilateral 02/04/2023   Procedure: UPPER EXTREMITY VENOGRAPHY;  Surgeon: Gretta Lonni PARAS, MD;  Location: [facility];  Service: Cardiovascular;  Laterality: Bilateral;   VENOUS ANGIOPLASTY  08/26/2023   Procedure: VENOUS ANGIOPLASTY;  Surgeon: Serene Gaile ORN, MD;  Location: [facility];  Service: Cardiovascular;;  Basilic Vein    Short Social History:  Social History  Tobacco Use   Smoking status: Never    Passive exposure: Never   Smokeless tobacco: Never  Substance Use Topics   Alcohol use: No    No Known Allergies  No current facility-administered medications for this encounter.    REVIEW OF SYSTEMS All other systems were reviewed and are negative     Objective:   Objective   Vitals:   09/27/23 0713  BP: 114/66  Pulse: 73  Resp: 12  SpO2: 97%   There is no height or weight on file to calculate BMI.  Physical  Exam General: no acute distress Cardiac: hemodynamically stable Extremities: Palpable thrill in right arm brachiobasilic fistula, small area of hematoma laterally  Data: Reviewed fistulogram from Dr. Serene in July. An 80% basilic vein stenosis was treated with a 7 mm x 40 mm balloon.     Assessment/Plan:   Samuel Burns is a 38 y.o. male with ESRD presenting for fistulogram.  Having issues with cannulation. Last HD session Reviewed risks and benefits of fistulogram with intervention and patient agreed to proceed.   Norman Serve, MD [facility]

## 2023-09-27 NOTE — Op Note (Signed)
    Patient name: Samuel Burns MRN: [id number] DOB: 1985-08-14 Sex: male  09/27/2023 Pre-operative Diagnosis: ESRD on HD Post-operative diagnosis:  Same Surgeon:  Norman GORMAN Serve, MD Procedure Performed:  Ultrasound-guided access of left arm brachiobasilic fistula Fistulogram and central venogram Balloon angioplasty of basilic vein stenosis  Indications: [name] is a 38 year old male with ESRD on HD.  He presented to the HD access center today for fistulogram.  He has been having issues with cannulation.  His last HD session was Friday.  He had undergone fistulogram with Dr. Serene about a month ago with balloon angioplasty with a 7 mm balloon.  Risks and benefits of fistulogram with intervention were reviewed.  Findings:  Widely patent central venous system.  Widely patent axillary outflow vein.  Approximate 70% stenosis of the previously treated basilic vein stenosis.   Procedure:  The patient was identified in the holding area and taken to the cath lab  The patient was then placed supine on the table and prepped and draped in the usual sterile fashion.  A time out was called.  Ultrasound was used to evaluate the right arm AV access. This was accessed under u/s guidance. An 018 wire was advanced without resistance, a micropuncture sheath was placed and fistulagram obtained which demonstrated the above findings.  This access was then upsized to a 47F short sheath over a Bentson wire.  The Bentson wire was used to cross the lesion and lesion was first treated with an 8 mm Mustang balloon.  There was still residual stenosis and therefore an 8 mm x 30 mm Athlet[employer] balloon was taken to 19 atm and the lesion then fully expanded.  Completion angiogram demonstrated minimal residual stenosis in the treated segment.  Contrast: 30 cc Sedation: None  Impression: Successful balloon angioplasty of the basilic vein stenosis with minimal residual stenosis Okay to attempt cannulation on  Wednesday   Norman GORMAN Serve MD [facility] [place] Office: [phone]

## 2023-09-28 ENCOUNTER — Encounter (HOSPITAL_COMMUNITY): Payer: Self-pay | Admitting: Vascular Surgery

## 2023-09-28 ENCOUNTER — Telehealth: Payer: Self-pay

## 2023-09-28 NOTE — Transitions of Care (Post Inpatient/ED Visit) (Signed)
   09/28/2023  Name: Tayt Moyers MRN: [id number] DOB: 1985/04/02  Today's TOC FU Call Status: Today's TOC FU Call Status:: Unsuccessful Call (2nd Attempt) Unsuccessful Call (1st Attempt) Date: 09/27/23 Unsuccessful Call (2nd Attempt) Date: 09/28/23  Attempted to reach the patient regarding the most recent Inpatient/ED visit.  Follow Up Plan: Additional outreach attempts will be made to reach the patient to complete the Transitions of Care (Post Inpatient/ED visit) call.   Signature  Slater Diesel, RN

## 2023-09-29 ENCOUNTER — Telehealth: Payer: Self-pay

## 2023-09-29 DIAGNOSIS — D689 Coagulation defect, unspecified: Secondary | ICD-10-CM | POA: Diagnosis not present

## 2023-09-29 NOTE — Transitions of Care (Post Inpatient/ED Visit) (Signed)
   09/29/2023  Name: Samuel Burns MRN: [id number] DOB: January 19, 1986  Today's TOC FU Call Status: Today's TOC FU Call Status:: Unsuccessful Call (3rd Attempt) Unsuccessful Call (1st Attempt) Date: 09/27/23 Unsuccessful Call (2nd Attempt) Date: 09/28/23 Unsuccessful Call (3rd Attempt) Date: 09/29/23  Attempted to reach the patient regarding the most recent Inpatient/ED visit.  Follow Up Plan: No further outreach attempts will be made at this time. We have been unable to contact the patient.  Letter sent to patient requesting he contact [facility] to schedule a follow up visit as we have not been able to reach him   Signature Slater Diesel, RN

## 2023-09-30 ENCOUNTER — Encounter (HOSPITAL_COMMUNITY): Payer: Self-pay

## 2023-09-30 ENCOUNTER — Other Ambulatory Visit (HOSPITAL_COMMUNITY): Payer: Self-pay

## 2023-10-01 DIAGNOSIS — N2581 Secondary hyperparathyroidism of renal origin: Secondary | ICD-10-CM | POA: Diagnosis not present

## 2023-10-04 DIAGNOSIS — D631 Anemia in chronic kidney disease: Secondary | ICD-10-CM | POA: Diagnosis not present

## 2023-10-04 DIAGNOSIS — Z992 Dependence on renal dialysis: Secondary | ICD-10-CM | POA: Diagnosis not present

## 2023-10-04 DIAGNOSIS — N2581 Secondary hyperparathyroidism of renal origin: Secondary | ICD-10-CM | POA: Diagnosis not present

## 2023-10-04 DIAGNOSIS — D689 Coagulation defect, unspecified: Secondary | ICD-10-CM | POA: Diagnosis not present

## 2023-10-04 DIAGNOSIS — Z4800 Encounter for change or removal of nonsurgical wound dressing: Secondary | ICD-10-CM | POA: Diagnosis not present

## 2023-10-04 DIAGNOSIS — N186 End stage renal disease: Secondary | ICD-10-CM | POA: Diagnosis not present

## 2023-10-06 DIAGNOSIS — N186 End stage renal disease: Secondary | ICD-10-CM | POA: Diagnosis not present

## 2023-10-06 DIAGNOSIS — Z992 Dependence on renal dialysis: Secondary | ICD-10-CM | POA: Diagnosis not present

## 2023-10-06 DIAGNOSIS — N2581 Secondary hyperparathyroidism of renal origin: Secondary | ICD-10-CM | POA: Diagnosis not present

## 2023-10-06 DIAGNOSIS — D689 Coagulation defect, unspecified: Secondary | ICD-10-CM | POA: Diagnosis not present

## 2023-10-06 DIAGNOSIS — D631 Anemia in chronic kidney disease: Secondary | ICD-10-CM | POA: Diagnosis not present

## 2023-10-06 DIAGNOSIS — Z4800 Encounter for change or removal of nonsurgical wound dressing: Secondary | ICD-10-CM | POA: Diagnosis not present

## 2023-10-07 ENCOUNTER — Ambulatory Visit: Attending: Internal Medicine | Admitting: Internal Medicine

## 2023-10-07 ENCOUNTER — Encounter: Payer: Self-pay | Admitting: Internal Medicine

## 2023-10-07 VITALS — BP 124/83 | HR 96 | Temp 98.7°F | Ht 68.0 in | Wt 189.2 lb

## 2023-10-07 DIAGNOSIS — Z992 Dependence on renal dialysis: Secondary | ICD-10-CM

## 2023-10-07 DIAGNOSIS — I129 Hypertensive chronic kidney disease with stage 1 through stage 4 chronic kidney disease, or unspecified chronic kidney disease: Secondary | ICD-10-CM | POA: Diagnosis not present

## 2023-10-07 DIAGNOSIS — N186 End stage renal disease: Secondary | ICD-10-CM

## 2023-10-07 DIAGNOSIS — F411 Generalized anxiety disorder: Secondary | ICD-10-CM

## 2023-10-07 DIAGNOSIS — F330 Major depressive disorder, recurrent, mild: Secondary | ICD-10-CM | POA: Diagnosis not present

## 2023-10-07 DIAGNOSIS — Z1159 Encounter for screening for other viral diseases: Secondary | ICD-10-CM | POA: Diagnosis not present

## 2023-10-07 DIAGNOSIS — Z09 Encounter for follow-up examination after completed treatment for conditions other than malignant neoplasm: Secondary | ICD-10-CM | POA: Diagnosis not present

## 2023-10-07 MED ORDER — AMLODIPINE BESYLATE 10 MG PO TABS: ORAL_TABLET | ORAL | Status: DC

## 2023-10-07 NOTE — Patient Instructions (Signed)
 VISIT SUMMARY:  You had a follow-up appointment today after your recent hospitalization for low blood pressure following a dialysis session. Your blood pressure has been stable since your discharge, and you have adjusted your medications accordingly. We also discussed your ongoing treatment for depression and anxiety, as well as your sleep issues.  YOUR PLAN:  -END-STAGE RENAL DISEASE ON HEMODIALYSIS: End-stage renal disease means your kidneys are no longer able to work as they should, and you need regular dialysis to filter your blood. Continue your hemodialysis sessions on Monday, Wednesday, and Friday as scheduled.  -HYPERTENSION AND ORTHOSTATIC HYPOTENSION: Hypertension is high blood pressure, and orthostatic hypotension is a drop in blood pressure when you stand up. Your blood pressure is currently well-managed without medication. Continue taking amlodipine  10 mg but avoid it on dialysis days if your blood pressure is low after dialysis. Hold off on taking Cozaar , clonidine , and metoprolol . Use midodrine  if you experience low blood pressure.  -DEPRESSION AND ANXIETY DISORDER: Depression and anxiety are mental health conditions that affect your mood and feelings. Continue taking Zoloft  and aripiprazole 2 mg daily, and keep attending your therapy sessions through [facility].  -INSOMNIA: Insomnia is difficulty falling or staying asleep. Continue taking aripiprazole 2 mg daily to help with your sleep issues.  -GENERAL HEALTH MAINTENANCE: You are due for your flu and hepatitis B vaccines. The flu vaccine will be given during your dialysis session, and we will check your hepatitis B immunity status with a blood test. If you are not immune, we will start the hepatitis B vaccine series.  INSTRUCTIONS:  Please continue monitoring your blood pressure regularly and take your medications as discussed. Receive your flu vaccine during your next dialysis session and check your hepatitis B immunity status with  a blood test. If you have any concerns or experience any new symptoms, please contact our office.

## 2023-10-07 NOTE — Progress Notes (Signed)
 Patient ID: Samuel Burns, male    DOB: 17-Jan-1986  MRN: [id number]  CC: Hospitalization Follow-up   Subjective: Samuel Burns is a 38 y.o. male who presents for chronic ds management. His concerns today include:  ESRD on HD, hypertensive renal disease, anemia of chronic disease, ED, MDD  Discussed the use of AI scribe software for clinical note transcription with the patient, who gave verbal consent to proceed.  History of Present Illness Samuel Burns is a 38 year old male with hypertension and end-stage renal disease on dialysis who presents for follow-up after a recent hospitalization for hypotension.  He was hospitalized from August 6th for two days due to hypotension following a dialysis session. He experienced a fainting episode after taking his blood pressure medication post-dialysis, which he usually takes at bedtime. His blood pressure was recorded at 77/55 mmHg in the ambulance. During the episode, he felt unwell, sweaty, and eventually lost consciousness. His son noticed the excessive sweating before he fainted.  During the hospital stay, his blood pressure medications, including amlodipine , metoprolol  75 mg twice daily, clonidine  0.1 mg twice daily, and Cozaar , were stopped. His blood pressure normalized, and he was discharged on [place]or[place] to take PRN if BP < 110. Since discharge, he monitors his blood pressure regularly, checking it after meals, and it has been stable in the 120s to 130s mmHg range without medication. He only takes losartan  in the morning and amlodipine  in the evening if his blood pressure exceeds 150 mmHg. He has completely stopped clonidine  and metoprolol . No chest pain or shortness of breath. There is no swelling in his legs.  At dialysis, his blood pressure has been stable in the 120s to 130s mmHg range. Previously, it would drop to the 80s or 90s mmHg during dialysis. He was prescribed midodrine  10 mg three times a day as needed for low blood pressure but  has not needed to take it yet.  ESRD: He continues dialysis on Monday, Wednesday, and Friday.   MDD/GAD: He is also on Zoloft  for depression and started therapy two weeks ago. He was prescribed Abilify (aripiprazole) 2 mg daily for depression and sleep issues, which he has been taking for two weeks. He reports moderate depression and severe anxiety, with anxiety worsening recently.  He feels the medications have definitely helped with his depression.  His psychiatric provider is an online provider.  HM: He is unsure if he has received the hepatitis B vaccine series.      Patient Active Problem List   Diagnosis Date Noted   Hypotension 09/22/2023   Involuntary movements 08/03/2022   Myoclonus 08/02/2022   Essential hypertension 08/02/2022   Numbness and tingling in left hand 02/26/2022   Mild major depression (HCC) 04/22/2021   Erectile dysfunction 07/30/2020   Pericardial effusion 12/15/2019   Hypertensive renal disease 12/15/2019   Anemia of chronic disease    ESRD (end stage renal disease) on dialysis [facility])      Current Outpatient Medications on File Prior to Visit  Medication Sig Dispense Refill   ARIPiprazole (ABILIFY) 2 MG tablet Take 2 mg by mouth daily.     acetaminophen  (TYLENOL ) 500 MG tablet Take 500-1,000 mg by mouth every 6 (six) hours as needed for moderate pain (pain score 4-6) or headache.     BIOTIN PO Take 500 mg by mouth at bedtime.     fluticasone  (FLONASE ) 50 MCG/ACT nasal spray Place 2 sprays into both nostrils daily. (Patient taking differently: Place 2 sprays into both  nostrils daily as needed for allergies.) 16 g 0   hydrOXYzine  (VISTARIL ) 25 MG capsule Take 1 capsule (25 mg total) by mouth in the morning and at bedtime. 60 capsule 0   lactulose, encephalopathy, (CHRONULAC) 10 GM/15ML SOLN Take 10-20 g by mouth daily as needed (Constipation).     midodrine  (PROAMATINE ) 10 MG tablet Take 1 tablet (10 mg total) by mouth 3 (three) times daily as needed  (SBP<110). 90 tablet 0   Naphazoline HCl (CLEAR EYES OP) Place 2 drops into both eyes every other day. Instill two drops per eye every other day in the morning and at bedtime as needed for dry eyes.     Polyethylene Glycol 400 0.25 % SOLN Place 2 drops into both eyes daily as needed (Dry eyes). Instill two drops in each eye daily as needed for dry eyes.     sertraline  (ZOLOFT ) 100 MG tablet Take 1 tablet (100 mg total) by mouth in the morning. 30 tablet 6   sucroferric oxyhydroxide (VELPHORO ) 500 MG chewable tablet Chew 500 mg by mouth 3 (three) times daily with meals. Chew one tablet by mouth three time per day with meals.     No current facility-administered medications on file prior to visit.    No Known Allergies  Social History   Socioeconomic History   Marital status: Single    Spouse name: 1   Number of children: Not on file   Years of education: Not on file   Highest education level: GED or equivalent  Occupational History   Not on file  Tobacco Use   Smoking status: Never    Passive exposure: Never   Smokeless tobacco: Never  Vaping Use   Vaping status: Former   Quit date: 01/01/2022  Substance and Sexual Activity   Alcohol use: No   Drug use: Yes    Types: Marijuana    Comment: Informed to withhold for 24 hours prior to surgery   Sexual activity: Yes    Birth control/protection: Condom  Other Topics Concern   Not on file  Social History Narrative   Home with son   Social Drivers of Health   Financial Resource Strain: Low Risk  (01/05/2023)   Overall Financial Resource Strain (CARDIA)    Difficulty of Paying Living Expenses: Not hard at all  Food Insecurity: No Food Insecurity (01/05/2023)   Hunger Vital Sign    Worried About Running Out of Food in the Last Year: Never true    Ran Out of Food in the Last Year: Never true  Transportation Needs: No Transportation Needs (01/05/2023)   PRAPARE - Administrator, Civil Service (Medical): No    Lack of  Transportation (Non-Medical): No  Physical Activity: Inactive (01/05/2023)   Exercise Vital Sign    Days of Exercise per Week: 0 days    Minutes of Exercise per Session: 0 min  Stress: No Stress Concern Present (01/05/2023)   [employer] of Occupational Health - Occupational Stress Questionnaire    Feeling of Stress : Not at all  Social Connections: Socially Isolated (01/05/2023)   Social Connection and Isolation Panel    Frequency of Communication with Friends and Family: More than three times a week    Frequency of Social Gatherings with Friends and Family: Once a week    Attends Religious Services: Never    Database administrator or Organizations: No    Attends Banker Meetings: Never    Marital Status: Never married  Intimate Partner Violence: Not At Risk (01/05/2023)   Humiliation, Afraid, Rape, and Kick questionnaire    Fear of Current or Ex-Partner: No    Emotionally Abused: No    Physically Abused: No    Sexually Abused: No    Family History  Problem Relation Age of Onset   Hypertension Maternal Grandmother    Kidney disease Father     Past Surgical History:  Procedure Laterality Date   A/V SHUNT INTERVENTION Right 08/26/2023   Procedure: A/V SHUNT INTERVENTION;  Surgeon: Serene Gaile ORN, MD;  Location: [facility];  Service: Cardiovascular;  Laterality: Right;   A/V SHUNT INTERVENTION Right 09/27/2023   Procedure: A/V SHUNT INTERVENTION;  Surgeon: Pearline Norman RAMAN, MD;  Location: [facility];  Service: Cardiovascular;  Laterality: Right;   AV FISTULA PLACEMENT Right 11/24/2019   Procedure: Creation of RIGHT ARM Brachial/Cephalic ARTERIOVENOUS (AV) FISTULA.;  Surgeon: Gretta Lonni PARAS, MD;  Location: [facility];  Service: Vascular;  Laterality: Right;   AV FISTULA PLACEMENT Left 12/22/2021   Procedure: LEFT ARM BRACHIOCEPHALIC ARTERIOVENOUS (AV) FISTULA CREATION;  Surgeon: Gretta Lonni PARAS, MD;  Location: [facility];  Service: Vascular;  Laterality: Left;    AV FISTULA PLACEMENT Left 12/03/2022   Procedure: INSERTION OF LEFT ARM ARTERIOVENOUS (AV) GOR-TEX GRAFT;  Surgeon: Magda Debby SAILOR, MD;  Location: [facility];  Service: Vascular;  Laterality: Left;   AV FISTULA PLACEMENT Right 04/05/2023   Procedure: RIGHT FIRST STAGE BRACHIOBASILIC ARTERIOVENOUS (AV) FISTULA CREATION;  Surgeon: Gretta Lonni PARAS, MD;  Location: [facility];  Service: Vascular;  Laterality: Right;  Regional block   BASCILIC VEIN TRANSPOSITION Left 01/27/2022   Procedure: LEFT FIRST STAGE BASILIC VEIN TRANSPOSITION;  Surgeon: Lanis Fonda BRAVO, MD;  Location: [facility];  Service: Vascular;  Laterality: Left;  PERIPHERAL NERVE BLOCK   BASCILIC VEIN TRANSPOSITION Right 06/24/2023   Procedure: TRANSPOSITION, VEIN, BASILIC;  Surgeon: Magda Debby SAILOR, MD;  Location: [facility];  Service: Vascular;  Laterality: Right;   CAPD INSERTION N/A 09/10/2022   Procedure: LAPAROSCOPIC INSERTION CONTINUOUS AMBULATORY PERITONEAL DIALYSIS  (CAPD) CATHETER;  Surgeon: Magda Debby SAILOR, MD;  Location: [facility];  Service: Vascular;  Laterality: N/A;   CAPD REMOVAL N/A 12/03/2022   Procedure: REMOVAL CONTINUOUS AMBULATORY PERITONEAL DIALYSIS CATHETER;  Surgeon: Magda Debby SAILOR, MD;  Location: [facility];  Service: Vascular;  Laterality: N/A;   IR AV DIALY SHUNT INTRO NEEDLE/INTRACATH INITIAL W/PTA/IMG RIGHT Right 02/11/2021   IR DIALY SHUNT INTRO NEEDLE/INTRACATH INITIAL W/IMG RIGHT Right 04/30/2020   IR FLUORO GUIDE CV LINE LEFT  11/21/2021   IR FLUORO GUIDE CV LINE RIGHT  11/20/2019   IR REMOVAL TUN CV CATH W/O FL  06/04/2020   IR US  GUIDE VASC ACCESS LEFT  11/21/2021   IR US  GUIDE VASC ACCESS RIGHT  11/20/2019   IR US  GUIDE VASC ACCESS RIGHT  04/30/2020   IR US  GUIDE VASC ACCESS RIGHT  02/11/2021   UPPER EXTREMITY VENOGRAPHY Bilateral 02/04/2023   Procedure: UPPER EXTREMITY VENOGRAPHY;  Surgeon: Gretta Lonni PARAS, MD;  [facility];  Service: Cardiovascular;  Laterality: Bilateral;   VENOUS  ANGIOPLASTY  08/26/2023   Procedure: VENOUS ANGIOPLASTY;  Surgeon: Serene Gaile ORN, MD;  Location: [facility];  Service: Cardiovascular;;  Basilic Vein   VENOUS ANGIOPLASTY  09/27/2023   Procedure: VENOUS ANGIOPLASTY;  Surgeon: Pearline Norman RAMAN, MD;  Location: [facility];  Service: Cardiovascular;;    ROS: Review of Systems Negative except as stated above  PHYSICAL EXAM:  BP 124/83 (BP Location: Right Leg, Patient Position: Sitting, Cuff Size: Normal)   Pulse 96   Temp 98.7 F (37.1 C) (Oral)   Ht 5' 8 (1.727 m)   Wt 189 lb 3.2 oz (85.8 kg)   SpO2 98%   BMI 28.77 kg/m   Physical Exam   General appearance - alert, well appearing, and in no distress Mental status - normal mood, behavior, speech, dress, motor activity, and thought processes Chest - clear to auscultation, no wheezes, rales or rhonchi, symmetric air entry Heart - normal rate, regular rhythm, normal S1, S2, no murmurs, rubs, clicks or gallops Extremities - peripheral pulses normal, no pedal edema, no clubbing or cyanosis     Latest Ref Rng & Units 09/24/2023    8:20 AM 09/23/2023    5:23 AM 09/22/2023    8:54 PM  CMP  Glucose 70 - 99 mg/dL 888   97   BUN 6 - 20 mg/dL 52   28   Creatinine 9.38 - 1.24 mg/dL 87.33  0.10  1.07   Sodium 135 - 145 mmol/L 141   140   Potassium 3.5 - 5.1 mmol/L 4.1   3.9   Chloride 98 - 111 mmol/L 101   96   CO2 22 - 32 mmol/L 25   27   Calcium  8.9 - 10.3 mg/dL 8.6   8.5   Total Protein 6.5 - 8.1 g/dL   8.3   Total Bilirubin 0.0 - 1.2 mg/dL   0.9   Alkaline Phos 38 - 126 U/L   184   AST 15 - 41 U/L   14   ALT 0 - 44 U/L   12    Lipid Panel     Component Value Date/Time   CHOL 186 11/20/2019 0428   CHOL 157 04/08/2017 1520   TRIG 207 (H) 11/20/2019 0428   HDL 30 (L) 11/20/2019 0428   HDL 39 (L) 04/08/2017 1520   CHOLHDL 6.2 11/20/2019 0428   VLDL 41 (H) 11/20/2019 0428   LDLCALC 115 (H) 11/20/2019 0428   LDLCALC 95 04/08/2017 1520    CBC    Component Value Date/Time   WBC  7.9 09/24/2023 0820   RBC 3.26 (L) 09/24/2023 0820   HGB 9.9 (L) 09/24/2023 0820   HGB 10.4 (L) 12/15/2019 1546   HCT 31.1 (L) 09/24/2023 0820   HCT 32.3 (L) 12/15/2019 1546   PLT 241 09/24/2023 0820   PLT 249 12/15/2019 1546   MCV 95.4 09/24/2023 0820   MCV 92 12/15/2019 1546   MCH 30.4 09/24/2023 0820   MCHC 31.8 09/24/2023 0820   RDW 14.8 09/24/2023 0820   RDW 13.0 12/15/2019 1546   LYMPHSABS 1.0 09/22/2023 2054   MONOABS 0.9 09/22/2023 2054   EOSABS 0.1 09/22/2023 2054   BASOSABS 0.1 09/22/2023 2054    ASSESSMENT AND PLAN: 1. Hospital discharge follow-up (Primary)  2. Hypertensive renal disease Metoprolol , clonidine  and Cozaar  have been discontinued due to recent issues with hypotension.  Advised patient to use only the amlodipine  and hold on dialysis days - amLODipine  (NORVASC ) 10 MG tablet; 1 tab PO daily. 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  3. ESRD on dialysis [facility]) Patient continues dialysis Monday Wednesday and Fridays.  4. Major depressive disorder, recurrent episode, mild (HCC) 5. GAD (generalized anxiety disorder) Patient receiving therapy and medication management through an online provider.  Abilify recently added to Zoloft .  He feels stable on the medications.  6. Need for hepatitis B screening test Patient agreeable to  have blood checked to see whether he is adequately immunized against hepatitis B.  If he is not, I recommend getting the vaccine series. - Hepatitis B surface antigen; Future - Hepatitis B surface antibody,quantitative; Future    Patient was given the opportunity to ask questions.  Patient verbalized understanding of the plan and was able to repeat key elements of the plan.   This documentation was completed using Paediatric nurse.  Any transcriptional errors are unintentional.  Orders Placed This Encounter  Procedures   Hepatitis B surface antigen   Hepatitis B surface antibody,quantitative     Requested Prescriptions    Signed Prescriptions Disp Refills   amLODipine  (NORVASC ) 10 MG tablet      Sig: 1 tab PO daily. 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    Return in about 4 months (around 02/06/2024).  Barnie Louder, MD, FACP

## 2023-10-11 DIAGNOSIS — D631 Anemia in chronic kidney disease: Secondary | ICD-10-CM | POA: Diagnosis not present

## 2023-10-12 ENCOUNTER — Ambulatory Visit: Attending: Internal Medicine

## 2023-10-13 DIAGNOSIS — Z992 Dependence on renal dialysis: Secondary | ICD-10-CM | POA: Diagnosis not present

## 2023-10-15 ENCOUNTER — Other Ambulatory Visit: Payer: Self-pay | Admitting: Internal Medicine

## 2023-10-15 ENCOUNTER — Encounter: Payer: Self-pay | Admitting: Internal Medicine

## 2023-10-15 DIAGNOSIS — N186 End stage renal disease: Secondary | ICD-10-CM | POA: Diagnosis not present

## 2023-10-15 DIAGNOSIS — I129 Hypertensive chronic kidney disease with stage 1 through stage 4 chronic kidney disease, or unspecified chronic kidney disease: Secondary | ICD-10-CM

## 2023-10-15 DIAGNOSIS — D689 Coagulation defect, unspecified: Secondary | ICD-10-CM | POA: Diagnosis not present

## 2023-10-15 DIAGNOSIS — F411 Generalized anxiety disorder: Secondary | ICD-10-CM

## 2023-10-15 DIAGNOSIS — N2581 Secondary hyperparathyroidism of renal origin: Secondary | ICD-10-CM | POA: Diagnosis not present

## 2023-10-15 DIAGNOSIS — Z4800 Encounter for change or removal of nonsurgical wound dressing: Secondary | ICD-10-CM | POA: Diagnosis not present

## 2023-10-15 DIAGNOSIS — D631 Anemia in chronic kidney disease: Secondary | ICD-10-CM | POA: Diagnosis not present

## 2023-10-15 DIAGNOSIS — Z992 Dependence on renal dialysis: Secondary | ICD-10-CM | POA: Diagnosis not present

## 2023-10-15 DIAGNOSIS — F330 Major depressive disorder, recurrent, mild: Secondary | ICD-10-CM

## 2023-10-15 MED ORDER — HYDROXYZINE PAMOATE 25 MG PO CAPS
25.0000 mg | ORAL_CAPSULE | Freq: Two times a day (BID) | ORAL | 4 refills | Status: AC
  Filled 2023-10-15: qty 60, 30d supply, fill #0
  Filled 2023-11-23: qty 60, 30d supply, fill #1
  Filled 2023-12-28: qty 60, 30d supply, fill #2
  Filled 2024-02-08 – 2024-02-22 (×2): qty 60, 30d supply, fill #3

## 2023-10-15 MED ORDER — AMLODIPINE BESYLATE 10 MG PO TABS: ORAL_TABLET | ORAL | 1 refills | Status: AC

## 2023-10-15 MED ORDER — SERTRALINE HCL 100 MG PO TABS
100.0000 mg | ORAL_TABLET | Freq: Every morning | ORAL | 6 refills | Status: AC
  Filled 2023-10-15: qty 30, 30d supply, fill #0
  Filled 2023-11-23: qty 30, 30d supply, fill #1
  Filled 2023-12-28: qty 30, 30d supply, fill #2
  Filled 2024-02-08 – 2024-02-22 (×2): qty 30, 30d supply, fill #3

## 2023-10-16 ENCOUNTER — Other Ambulatory Visit (HOSPITAL_COMMUNITY): Payer: Self-pay

## 2023-10-17 ENCOUNTER — Other Ambulatory Visit: Payer: Self-pay

## 2023-10-17 DIAGNOSIS — I129 Hypertensive chronic kidney disease with stage 1 through stage 4 chronic kidney disease, or unspecified chronic kidney disease: Secondary | ICD-10-CM | POA: Diagnosis not present

## 2023-10-17 DIAGNOSIS — Z992 Dependence on renal dialysis: Secondary | ICD-10-CM | POA: Diagnosis not present

## 2023-10-17 DIAGNOSIS — N186 End stage renal disease: Secondary | ICD-10-CM | POA: Diagnosis not present

## 2023-10-18 DIAGNOSIS — Z992 Dependence on renal dialysis: Secondary | ICD-10-CM | POA: Diagnosis not present

## 2023-10-18 DIAGNOSIS — Z4800 Encounter for change or removal of nonsurgical wound dressing: Secondary | ICD-10-CM | POA: Diagnosis not present

## 2023-10-18 DIAGNOSIS — D509 Iron deficiency anemia, unspecified: Secondary | ICD-10-CM | POA: Diagnosis not present

## 2023-10-18 DIAGNOSIS — N2581 Secondary hyperparathyroidism of renal origin: Secondary | ICD-10-CM | POA: Diagnosis not present

## 2023-10-18 DIAGNOSIS — D631 Anemia in chronic kidney disease: Secondary | ICD-10-CM | POA: Diagnosis not present

## 2023-10-18 DIAGNOSIS — D689 Coagulation defect, unspecified: Secondary | ICD-10-CM | POA: Diagnosis not present

## 2023-10-19 ENCOUNTER — Ambulatory Visit: Attending: Internal Medicine

## 2023-10-19 VITALS — Ht 68.0 in | Wt 181.0 lb

## 2023-10-19 DIAGNOSIS — Z0000 Encounter for general adult medical examination without abnormal findings: Secondary | ICD-10-CM | POA: Diagnosis not present

## 2023-10-19 NOTE — Patient Instructions (Addendum)
 Mr. Samuel Burns , Thank you for taking time out of your busy schedule to complete your Annual Wellness Visit with me. I enjoyed our conversation and look forward to speaking with you again next year. I, as well as your care team,  appreciate your ongoing commitment to your health goals. Please review the following plan we discussed and let me know if I can assist you in the future. Your Game plan/ To Do List    Referrals: If you haven't heard from the office you've been referred to, please reach out to them at the phone provided.   Follow up Visits: We will see or speak with you next year for your Next Medicare AWV with our clinical staff Have you seen your provider in the last 6 months (3 months if uncontrolled diabetes)? Yes  Clinician Recommendations:  Aim for 30 minutes of exercise or brisk walking, 6-8 glasses of water, and 5 servings of fruits and vegetables each day.       This is a list of the screenings recommended for you:  Health Maintenance  Topic Date Due   Hepatitis C Screening  Never done   Hepatitis B Vaccine (1 of 3 - 19+ 3-dose series) Never done   HPV Vaccine (1 - 3-dose SCDM series) Never done   Flu Shot  09/17/2023   COVID-19 Vaccine (2 - 2025-26 season) 10/18/2023   Medicare Annual Wellness Visit  10/18/2024   DTaP/Tdap/Td vaccine (2 - Td or Tdap) 05/24/2030   Pneumococcal Vaccine  Completed   HIV Screening  Completed   Meningitis B Vaccine  Aged Out    Advanced directives: (Declined) Advance directive discussed with you today. Even though you declined this today, please call our office should you change your mind, and we can give you the proper paperwork for you to fill out. Advance Care Planning is important because it:  [x]  Makes sure you receive the medical care that is consistent with your values, goals, and preferences  [x]  It provides guidance to your family and loved ones and reduces their decisional burden about whether or not they are making the right  decisions based on your wishes.  Follow the link provided in your after visit summary or read over the paperwork we have mailed to you to help you started getting your Advance Directives in place. If you need assistance in completing these, please reach out to us  so that we can help you!  See attachments for Preventive Care and Fall Prevention Tips.

## 2023-10-19 NOTE — Progress Notes (Signed)
 Because this visit was a virtual/telehealth visit,  certain criteria was not obtained, such a blood pressure, CBG if applicable, and timed get up and go. Any medications not marked as taking were not mentioned during the medication reconciliation part of the visit. Any vitals not documented were not able to be obtained due to this being a telehealth visit or patient was unable to self-report a recent blood pressure reading due to a lack of equipment at home via telehealth. Vitals that have been documented are verbally provided by the patient.   Subjective:   Samuel Burns is a 38 y.o. who presents for a Medicare Wellness preventive visit.  As a reminder, Annual Wellness Visits don't include a physical exam, and some assessments may be limited, especially if this visit is performed virtually. We may recommend an in-person follow-up visit with your provider if needed.  Visit Complete: Virtual I connected with  Nichalas Heffington on 10/19/23 by a audio enabled telemedicine application and verified that I am speaking with the correct person using two identifiers.  Patient Location: Home  Provider Location: Office/Clinic  I discussed the limitations of evaluation and management by telemedicine. The patient expressed understanding and agreed to proceed.  Vital Signs: Because this visit was a virtual/telehealth visit, some criteria may be missing or patient reported. Any vitals not documented were not able to be obtained and vitals that have been documented are patient reported.  VideoDeclined- This patient declined Librarian, academic. Therefore the visit was completed with audio only.  Persons Participating in Visit: Patient.  AWV Questionnaire: No: Patient Medicare AWV questionnaire was not completed prior to this visit.  Cardiac Risk Factors include: family history of premature cardiovascular disease;hypertension;male gender;smoking/ tobacco exposure     Objective:     Today's Vitals   10/19/23 1408  Weight: 181 lb (82.1 kg)  Height: 5' 8 (1.727 m)  PainSc: 0-No pain   Body mass index is 27.52 kg/m.     09/23/2023   12:42 AM 09/22/2023    7:53 PM 06/24/2023   10:52 AM 04/05/2023   10:15 AM 04/01/2023    1:45 PM 02/04/2023    8:10 AM 12/03/2022    7:03 AM  Advanced Directives  Does Patient Have a Medical Advance Directive? No No No No No No No  Would patient like information on creating a medical advance directive? No - Patient declined  No - Patient declined No - Patient declined  Yes (Inpatient - patient defers creating a medical advance directive at this time - Information given) No - Patient declined    Current Medications (verified) Outpatient Encounter Medications as of 10/19/2023  Medication Sig   acetaminophen  (TYLENOL ) 500 MG tablet Take 500-1,000 mg by mouth every 6 (six) hours as needed for moderate pain (pain score 4-6) or headache.   amLODipine  (NORVASC ) 10 MG tablet 1 tab PO daily. 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   ARIPiprazole (ABILIFY) 2 MG tablet Take 2 mg by mouth daily.   BIOTIN PO Take 500 mg by mouth at bedtime.   fluticasone  (FLONASE ) 50 MCG/ACT nasal spray Place 2 sprays into both nostrils daily. (Patient taking differently: Place 2 sprays into both nostrils daily as needed for allergies.)   hydrOXYzine  (VISTARIL ) 25 MG capsule Take 1 capsule (25 mg total) by mouth in the morning and at bedtime.   lactulose, encephalopathy, (CHRONULAC) 10 GM/15ML SOLN Take 10-20 g by mouth daily as needed (Constipation).   midodrine  (PROAMATINE ) 10 MG tablet Take 1 tablet (10  mg total) by mouth 3 (three) times daily as needed (SBP<110).   Naphazoline HCl (CLEAR EYES OP) Place 2 drops into both eyes every other day. Instill two drops per eye every other day in the morning and at bedtime as needed for dry eyes.   Polyethylene Glycol 400 0.25 % SOLN Place 2 drops into both eyes daily as needed (Dry eyes). Instill two drops in each eye daily as needed for  dry eyes.   sertraline  (ZOLOFT ) 100 MG tablet Take 1 tablet (100 mg total) by mouth in the morning.   sucroferric oxyhydroxide (VELPHORO ) 500 MG chewable tablet Chew 500 mg by mouth 3 (three) times daily with meals. Chew one tablet by mouth three time per day with meals.   No facility-administered encounter medications on file as of 10/19/2023.    Allergies (verified) Patient has no known allergies.   History: Past Medical History:  Diagnosis Date   ADHD (attention deficit hyperactivity disorder)    ADHD   Anemia    Anxiety    Asthma    as a child no current problems as adult, no inhaler   Depression    Hypertension    Renal failure    Dialysis M/W/F   Past Surgical History:  Procedure Laterality Date   A/V SHUNT INTERVENTION Right 08/26/2023   Procedure: A/V SHUNT INTERVENTION;  Surgeon: Serene Gaile ORN, MD;  Location: [facility];  Service: Cardiovascular;  Laterality: Right;   A/V SHUNT INTERVENTION Right 09/27/2023   Procedure: A/V SHUNT INTERVENTION;  Surgeon: Pearline Norman RAMAN, MD;  Location: [facility];  Service: Cardiovascular;  Laterality: Right;   AV FISTULA PLACEMENT Right 11/24/2019   Procedure: Creation of RIGHT ARM Brachial/Cephalic ARTERIOVENOUS (AV) FISTULA.;  Surgeon: Gretta Lonni PARAS, MD;  Location: [facility];  Service: Vascular;  Laterality: Right;   AV FISTULA PLACEMENT Left 12/22/2021   Procedure: LEFT ARM BRACHIOCEPHALIC ARTERIOVENOUS (AV) FISTULA CREATION;  Surgeon: Gretta Lonni PARAS, MD;  Location: [facility];  Service: Vascular;  Laterality: Left;   AV FISTULA PLACEMENT Left 12/03/2022   Procedure: INSERTION OF LEFT ARM ARTERIOVENOUS (AV) GOR-TEX GRAFT;  Surgeon: Magda Debby SAILOR, MD;  Location: [facility];  Service: Vascular;  Laterality: Left;   AV FISTULA PLACEMENT Right 04/05/2023   Procedure: RIGHT FIRST STAGE BRACHIOBASILIC ARTERIOVENOUS (AV) FISTULA CREATION;  Surgeon: Gretta Lonni PARAS, MD;  Location: [facility];  Service: Vascular;  Laterality: Right;  Regional  block   BASCILIC VEIN TRANSPOSITION Left 01/27/2022   Procedure: LEFT FIRST STAGE BASILIC VEIN TRANSPOSITION;  Surgeon: Lanis Fonda BRAVO, MD;  Location[facility];  Service: Vascular;  Laterality: Left;  PERIPHERAL NERVE BLOCK   BASCILIC VEIN TRANSPOSITION Right 06/24/2023   Procedure: TRANSPOSITION, VEIN, BASILIC;  Surgeon: Magda Debby SAILOR, MD;  Location: [facility];  Service: Vascular;  Laterality: Right;   CAPD INSERTION N/A 09/10/2022   Procedure: LAPAROSCOPIC INSERTION CONTINUOUS AMBULATORY PERITONEAL DIALYSIS  (CAPD) CATHETER;  Surgeon: Magda Debby SAILOR, MD;  Location: [facility];  Service: Vascular;  Laterality: N/A;   CAPD REMOVAL N/A 12/03/2022   Procedure: REMOVAL CONTINUOUS AMBULATORY PERITONEAL DIALYSIS CATHETER;  Surgeon: Magda Debby SAILOR, MD;  Location: [facility];  Service: Vascular;  Laterality: N/A;   IR AV DIALY SHUNT INTRO NEEDLE/INTRACATH INITIAL W/PTA/IMG RIGHT Right 02/11/2021   IR DIALY SHUNT INTRO NEEDLE/INTRACATH INITIAL W/IMG RIGHT Right 04/30/2020   IR FLUORO GUIDE CV LINE LEFT  11/21/2021   IR FLUORO GUIDE CV LINE RIGHT  11/20/2019   IR REMOVAL TUN CV CATH W/O  FL  06/04/2020   IR US  GUIDE VASC ACCESS LEFT  11/21/2021   IR US  GUIDE VASC ACCESS RIGHT  11/20/2019   IR US  GUIDE VASC ACCESS RIGHT  04/30/2020   IR US  GUIDE VASC ACCESS RIGHT  02/11/2021   UPPER EXTREMITY VENOGRAPHY Bilateral 02/04/2023   Procedure: UPPER EXTREMITY VENOGRAPHY;  Surgeon: Gretta Lonni PARAS, MD;  [facility];  Service: Cardiovascular;  Laterality: Bilateral;   VENOUS ANGIOPLASTY  08/26/2023   Procedure: VENOUS ANGIOPLASTY;  Surgeon: Serene Gaile ORN, MD;  Location: [facility];  Service: Cardiovascular;;  Basilic Vein   VENOUS ANGIOPLASTY  09/27/2023   Procedure: VENOUS ANGIOPLASTY;  Surgeon: Pearline Norman RAMAN, MD;  Location: [facility];  Service: Cardiovascular;;   Family History  Problem Relation Age of Onset   Hypertension Maternal Grandmother    Kidney disease Father    Social History    Socioeconomic History   Marital status: Single    Spouse name: 1   Number of children: Not on file   Years of education: Not on file   Highest education level: GED or equivalent  Occupational History   Not on file  Tobacco Use   Smoking status: Never    Passive exposure: Never   Smokeless tobacco: Never  Vaping Use   Vaping status: Former   Quit date: 01/01/2022  Substance and Sexual Activity   Alcohol use: No   Drug use: Yes    Types: Marijuana    Comment: Informed to withhold for 24 hours prior to surgery   Sexual activity: Yes    Birth control/protection: Condom  Other Topics Concern   Not on file  Social History Narrative   Home with son   Social Drivers of Health   Financial Resource Strain: Low Risk  (10/19/2023)   Overall Financial Resource Strain (CARDIA)    Difficulty of Paying Living Expenses: Not hard at all  Food Insecurity: No Food Insecurity (10/19/2023)   Hunger Vital Sign    Worried About Running Out of Food in the Last Year: Never true    Ran Out of Food in the Last Year: Never true  Transportation Needs: No Transportation Needs (10/19/2023)   PRAPARE - Administrator, Civil Service (Medical): No    Lack of Transportation (Non-Medical): No  Physical Activity: Sufficiently Active (10/19/2023)   Exercise Vital Sign    Days of Exercise per Week: 5 days    Minutes of Exercise per Session: 30 min  Stress: No Stress Concern Present (10/19/2023)   [employer] of Occupational Health - Occupational Stress Questionnaire    Feeling of Stress: Not at all  Social Connections: Socially Isolated (10/19/2023)   Social Connection and Isolation Panel    Frequency of Communication with Friends and Family: More than three times a week    Frequency of Social Gatherings with Friends and Family: Once a week    Attends Religious Services: Never    Database administrator or Organizations: No    Attends Engineer, structural: Never    Marital Status:  Never married    Tobacco Counseling Counseling given: Not Answered    Clinical Intake:  Pre-visit preparation completed: Yes  Pain : No/denies pain Pain Score: 0-No pain     BMI - recorded: 27.52 Nutritional Status: BMI 25 -29 Overweight Nutritional Risks: None Diabetes: No  Lab Results  Component Value Date   HGBA1C 3.5 (L) 11/20/2019     How often do you  need to have someone help you when you read instructions, pamphlets, or other written materials from your doctor or pharmacy?: 1 - Never  Interpreter Needed?: No  Information entered by :: Garyson Stelly N. Quantasia Stegner, LPN.   Activities of Daily Living     10/19/2023    2:50 PM 09/27/2023    7:10 AM  In your present state of health, do you have any difficulty performing the following activities:  Hearing? 0 0  Vision? 0 0  Difficulty concentrating or making decisions? 0 0  Walking or climbing stairs? 0   Dressing or bathing? 0   Doing errands, shopping? 0   Preparing Food and eating ? N   Using the Toilet? N   In the past six months, have you accidently leaked urine? N   Do you have problems with loss of bowel control? N   Managing your Medications? N   Managing your Finances? N   Housekeeping or managing your Housekeeping? N     Patient Care Team: Vicci Barnie NOVAK, MD as PCP - [facility] ([facility], [facility], Maude POUR as Consulting Physician (Optometry)  I have updated your Care Teams any recent Medical Services you may have received from other providers in the past year.     Assessment:   This is a routine wellness examination for Samuel Burns.  Hearing/Vision screen Hearing Screening - Comments:: Denies hearing difficulties.  Vision Screening - Comments:: Wears rx glasses - not up to date with routine eye exams with Dr. Maude Abigail    Goals Addressed             This Visit's Progress    10/19/2023: My goal is to get down to 160 lbs.         Depression Screen      10/19/2023    2:10 PM 01/05/2023   10:44 AM 09/22/2022    8:38 AM 09/03/2022    3:02 PM 02/26/2022   11:00 AM 12/09/2021    2:41 PM 04/22/2021   10:32 AM  PHQ 2/9 Scores  PHQ - 2 Score 0 3 2 2 3 2  0  PHQ- 9 Score 1 6 6 6 8 6      Fall Risk     10/19/2023    1:58 PM 01/05/2023   10:44 AM 09/22/2022    8:17 AM 09/03/2022    1:54 PM 02/26/2022   10:59 AM  Fall Risk   Falls in the past year? 1 0 1 0 0  Number falls in past yr: 0 0 1 0 0  Injury with Fall? 1 0 0 0 0  Risk for fall due to :  No Fall Risks History of fall(s) No Fall Risks No Fall Risks  Follow up Falls evaluation completed;Falls prevention discussed Falls evaluation completed Education provided;Falls prevention discussed;Falls evaluation completed      MEDICARE RISK AT HOME:  Medicare Risk at Home Any stairs in or around the home?: Yes If so, are there any without handrails?: No Home free of loose throw rugs in walkways, pet beds, electrical cords, etc?: Yes Adequate lighting in your home to reduce risk of falls?: Yes Life alert?: No Use of a cane, walker or w/c?: No Grab bars in the bathroom?: Yes Shower chair or bench in shower?: No Elevated toilet seat or a handicapped toilet?: No  TIMED UP AND GO:  Was the test performed?  No  Cognitive Function: Declined/Normal: No cognitive concerns noted by patient or family. Patient alert, oriented,  able to answer questions appropriately and recall recent events. No signs of memory loss or confusion.    10/19/2023    2:01 PM  MMSE - Mini Mental State Exam  Not completed: Unable to complete        10/19/2023    2:00 PM 09/22/2022    8:41 AM  6CIT Screen  What Year? 0 points 0 points  What month? 0 points 0 points  What time? 0 points 0 points  Count back from 20 0 points 0 points  Months in reverse 0 points 0 points  Repeat phrase 0 points 0 points  Total Score 0 points 0 points    Immunizations Immunization History  Administered Date(s) Administered   Influenza,  Quadrivalent, Recombinant, Inj, Pf 12/08/2021   Influenza,inj,Quad PF,6+ Mos 11/22/2019, 11/22/2020   Influenza-Unspecified 12/02/2020   PFIZER(Purple Top)SARS-COV-2 Vaccination 11/27/2019   PNEUMOCOCCAL CONJUGATE-20 12/23/2020, 04/10/2022   Pneumococcal Conjugate-13 04/15/2020   Tdap 05/23/2020    Screening Tests Health Maintenance  Topic Date Due   Hepatitis C Screening  Never done   Hepatitis B Vaccines 19-59 Average Risk (1 of 3 - 19+ 3-dose series) Never done   HPV VACCINES (1 - 3-dose SCDM series) Never done   INFLUENZA VACCINE  09/17/2023   COVID-19 Vaccine (2 - 2025-26 season) 10/18/2023   Medicare Annual Wellness (AWV)  10/18/2024   DTaP/Tdap/Td (2 - Td or Tdap) 05/24/2030   Pneumococcal Vaccine  Completed   HIV Screening  Completed   Meningococcal B Vaccine  Aged Out    Health Maintenance  Health Maintenance Due  Topic Date Due   Hepatitis C Screening  Never done   Hepatitis B Vaccines 19-59 Average Risk (1 of 3 - 19+ 3-dose series) Never done   HPV VACCINES (1 - 3-dose SCDM series) Never done   INFLUENZA VACCINE  09/17/2023   COVID-19 Vaccine (2 - 2025-26 season) 10/18/2023   Health Maintenance Items Addressed: Yes Patient aware of current care gaps.  Additional Screening:  Vision Screening: Recommended annual ophthalmology exams for early detection of glaucoma and other disorders of the eye. Would you like a referral to an eye doctor? No    Dental Screening: Recommended annual dental exams for proper oral hygiene  Community Resource Referral / Chronic Care Management: CRR required this visit?  No   CCM required this visit?  No   Plan:    I have personally reviewed and noted the following in the patient's chart:   Medical and social history Use of alcohol, tobacco or illicit drugs  Current medications and supplements including opioid prescriptions. Patient is not currently taking opioid prescriptions. Functional ability and status Nutritional  status Physical activity Advanced directives List of other physicians Hospitalizations, surgeries, and ER visits in previous 12 months Vitals Screenings to include cognitive, depression, and falls Referrals and appointments  In addition, I have reviewed and discussed with patient certain preventive protocols, quality metrics, and best practice recommendations. A written personalized care plan for preventive services as well as general preventive health recommendations were provided to patient.   Roz LOISE Fuller, LPN   [id number]   After Visit Summary: (MyChart) Due to this being a telephonic visit, the after visit summary with patients personalized plan was offered to patient via MyChart   Notes: Nothing significant to report at this time.

## 2023-10-20 DIAGNOSIS — D631 Anemia in chronic kidney disease: Secondary | ICD-10-CM | POA: Diagnosis not present

## 2023-10-27 DIAGNOSIS — D631 Anemia in chronic kidney disease: Secondary | ICD-10-CM | POA: Diagnosis not present

## 2023-10-28 DIAGNOSIS — Z992 Dependence on renal dialysis: Secondary | ICD-10-CM | POA: Diagnosis not present

## 2023-10-28 DIAGNOSIS — N186 End stage renal disease: Secondary | ICD-10-CM | POA: Diagnosis not present

## 2023-11-02 DIAGNOSIS — Z4800 Encounter for change or removal of nonsurgical wound dressing: Secondary | ICD-10-CM | POA: Diagnosis not present

## 2023-11-03 DIAGNOSIS — D509 Iron deficiency anemia, unspecified: Secondary | ICD-10-CM | POA: Diagnosis not present

## 2023-11-08 DIAGNOSIS — N186 End stage renal disease: Secondary | ICD-10-CM | POA: Diagnosis not present

## 2023-11-08 DIAGNOSIS — Z4800 Encounter for change or removal of nonsurgical wound dressing: Secondary | ICD-10-CM | POA: Diagnosis not present

## 2023-11-08 DIAGNOSIS — D689 Coagulation defect, unspecified: Secondary | ICD-10-CM | POA: Diagnosis not present

## 2023-11-08 DIAGNOSIS — D509 Iron deficiency anemia, unspecified: Secondary | ICD-10-CM | POA: Diagnosis not present

## 2023-11-08 DIAGNOSIS — D631 Anemia in chronic kidney disease: Secondary | ICD-10-CM | POA: Diagnosis not present

## 2023-11-08 DIAGNOSIS — Z992 Dependence on renal dialysis: Secondary | ICD-10-CM | POA: Diagnosis not present

## 2023-11-10 DIAGNOSIS — D631 Anemia in chronic kidney disease: Secondary | ICD-10-CM | POA: Diagnosis not present

## 2023-11-15 DIAGNOSIS — D689 Coagulation defect, unspecified: Secondary | ICD-10-CM | POA: Diagnosis not present

## 2023-11-15 DIAGNOSIS — Z4800 Encounter for change or removal of nonsurgical wound dressing: Secondary | ICD-10-CM | POA: Diagnosis not present

## 2023-11-15 DIAGNOSIS — N2581 Secondary hyperparathyroidism of renal origin: Secondary | ICD-10-CM | POA: Diagnosis not present

## 2023-11-15 DIAGNOSIS — D509 Iron deficiency anemia, unspecified: Secondary | ICD-10-CM | POA: Diagnosis not present

## 2023-11-15 DIAGNOSIS — D631 Anemia in chronic kidney disease: Secondary | ICD-10-CM | POA: Diagnosis not present

## 2023-11-15 DIAGNOSIS — Z992 Dependence on renal dialysis: Secondary | ICD-10-CM | POA: Diagnosis not present

## 2023-11-15 DIAGNOSIS — N186 End stage renal disease: Secondary | ICD-10-CM | POA: Diagnosis not present

## 2023-11-16 DIAGNOSIS — Z992 Dependence on renal dialysis: Secondary | ICD-10-CM | POA: Diagnosis not present

## 2023-11-16 DIAGNOSIS — I129 Hypertensive chronic kidney disease with stage 1 through stage 4 chronic kidney disease, or unspecified chronic kidney disease: Secondary | ICD-10-CM | POA: Diagnosis not present

## 2023-11-16 DIAGNOSIS — N186 End stage renal disease: Secondary | ICD-10-CM | POA: Diagnosis not present

## 2023-11-26 ENCOUNTER — Emergency Department (HOSPITAL_COMMUNITY)
Admission: EM | Admit: 2023-11-26 | Discharge: 2023-11-26 | Disposition: A | Discharge status: Home / Self Care | Attending: Emergency Medicine | Admitting: Emergency Medicine

## 2023-11-26 ENCOUNTER — Emergency Department (HOSPITAL_COMMUNITY)

## 2023-11-26 ENCOUNTER — Encounter (HOSPITAL_COMMUNITY): Payer: Self-pay

## 2023-11-26 ENCOUNTER — Other Ambulatory Visit: Payer: Self-pay

## 2023-11-26 DIAGNOSIS — R079 Chest pain, unspecified: Secondary | ICD-10-CM | POA: Diagnosis not present

## 2023-11-26 DIAGNOSIS — R519 Headache, unspecified: Secondary | ICD-10-CM | POA: Diagnosis not present

## 2023-11-26 DIAGNOSIS — S0990XA Unspecified injury of head, initial encounter: Secondary | ICD-10-CM | POA: Diagnosis not present

## 2023-11-26 DIAGNOSIS — M25561 Pain in right knee: Secondary | ICD-10-CM | POA: Insufficient documentation

## 2023-11-26 DIAGNOSIS — Y92410 Unspecified street and highway as the place of occurrence of the external cause: Secondary | ICD-10-CM | POA: Diagnosis not present

## 2023-11-26 DIAGNOSIS — M542 Cervicalgia: Secondary | ICD-10-CM | POA: Diagnosis not present

## 2023-11-26 DIAGNOSIS — Z7901 Long term (current) use of anticoagulants: Secondary | ICD-10-CM | POA: Diagnosis not present

## 2023-11-26 DIAGNOSIS — R9389 Abnormal findings on diagnostic imaging of other specified body structures: Secondary | ICD-10-CM | POA: Diagnosis not present

## 2023-11-26 DIAGNOSIS — S199XXA Unspecified injury of neck, initial encounter: Secondary | ICD-10-CM | POA: Diagnosis not present

## 2023-11-26 LAB — CBC WITH DIFFERENTIAL/PLATELET
Abs Immature Granulocytes: 0.01 K/uL (ref 0.00–0.07)
Basophils Absolute: 0 K/uL (ref 0.0–0.1)
Basophils Relative: 1 %
Eosinophils Absolute: 0.1 K/uL (ref 0.0–0.5)
Eosinophils Relative: 1 %
HCT: 32.5 % — ABNORMAL LOW (ref 39.0–52.0)
Hemoglobin: 10.5 g/dL — ABNORMAL LOW (ref 13.0–17.0)
Immature Granulocytes: 0 %
Lymphocytes Relative: 13 %
Lymphs Abs: 0.8 K/uL (ref 0.7–4.0)
MCH: 31.3 pg (ref 26.0–34.0)
MCHC: 32.3 g/dL (ref 30.0–36.0)
MCV: 96.7 fL (ref 80.0–100.0)
Monocytes Absolute: 0.5 K/uL (ref 0.1–1.0)
Monocytes Relative: 9 %
Neutro Abs: 4.6 K/uL (ref 1.7–7.7)
Neutrophils Relative %: 76 %
Platelets: 227 K/uL (ref 150–400)
RBC: 3.36 MIL/uL — ABNORMAL LOW (ref 4.22–5.81)
RDW: 16.3 % — ABNORMAL HIGH (ref 11.5–15.5)
WBC: 6.1 K/uL (ref 4.0–10.5)
nRBC: 0 % (ref 0.0–0.2)

## 2023-11-26 LAB — BASIC METABOLIC PANEL WITH GFR
Anion gap: 14 (ref 5–15)
BUN: 11 mg/dL (ref 6–20)
CO2: 31 mmol/L (ref 22–32)
Calcium: 8.6 mg/dL — ABNORMAL LOW (ref 8.9–10.3)
Chloride: 92 mmol/L — ABNORMAL LOW (ref 98–111)
Creatinine, Ser: 6.4 mg/dL — ABNORMAL HIGH (ref 0.61–1.24)
GFR, Estimated: 11 mL/min — ABNORMAL LOW (ref >60)
Glucose, Bld: 80 mg/dL (ref 70–99)
Potassium: 3.3 mmol/L — ABNORMAL LOW (ref 3.5–5.1)
Sodium: 137 mmol/L (ref 135–145)

## 2023-11-26 LAB — I-STAT CHEM 8, ED
BUN: 14 mg/dL (ref 6–20)
Calcium, Ion: 0.98 mmol/L — ABNORMAL LOW (ref 1.15–1.40)
Chloride: 94 mmol/L — ABNORMAL LOW (ref 98–111)
Creatinine, Ser: 7 mg/dL — ABNORMAL HIGH (ref 0.61–1.24)
Glucose, Bld: 77 mg/dL (ref 70–99)
HCT: 35 % — ABNORMAL LOW (ref 39.0–52.0)
Hemoglobin: 11.9 g/dL — ABNORMAL LOW (ref 13.0–17.0)
Potassium: 3.4 mmol/L — ABNORMAL LOW (ref 3.5–5.1)
Sodium: 139 mmol/L (ref 135–145)
TCO2: 34 mmol/L — ABNORMAL HIGH (ref 22–32)

## 2023-11-26 MED ORDER — ACETAMINOPHEN 500 MG PO TABS
1000.0000 mg | ORAL_TABLET | Freq: Once | ORAL | Status: AC
  Administered 2023-11-26: 1000 mg via ORAL
  Filled 2023-11-26: qty 2
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
## 2023-11-26 NOTE — Discharge Instructions (Signed)
 Return for any problem.  ?
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
## 2023-12-08 ENCOUNTER — Encounter (HOSPITAL_COMMUNITY): Payer: Self-pay | Admitting: Vascular Surgery

## 2024-02-15 ENCOUNTER — Ambulatory Visit: Admitting: Internal Medicine

## 2024-02-15 ENCOUNTER — Telehealth: Payer: Self-pay | Admitting: Internal Medicine

## 2024-02-15 NOTE — Telephone Encounter (Addendum)
 Contacted patient, no answer. Left voicemail requesting patient to call back.

## 2024-02-18 ENCOUNTER — Other Ambulatory Visit (HOSPITAL_COMMUNITY): Payer: Self-pay

## 2024-02-22 ENCOUNTER — Other Ambulatory Visit (HOSPITAL_COMMUNITY): Payer: Self-pay

## 2024-10-24 ENCOUNTER — Ambulatory Visit
